# Patient Record
Sex: Male | Born: 1960
Health system: Southern US, Community
[De-identification: ages and names within clinical notes are randomized; demographics above are authoritative.]

## PROBLEM LIST (undated history)

## (undated) DIAGNOSIS — T8859XA Other complications of anesthesia, initial encounter: Secondary | ICD-10-CM

## (undated) DIAGNOSIS — K279 Peptic ulcer, site unspecified, unspecified as acute or chronic, without hemorrhage or perforation: Secondary | ICD-10-CM

## (undated) DIAGNOSIS — E119 Type 2 diabetes mellitus without complications: Secondary | ICD-10-CM

## (undated) DIAGNOSIS — I10 Essential (primary) hypertension: Secondary | ICD-10-CM

## (undated) DIAGNOSIS — T7840XA Allergy, unspecified, initial encounter: Secondary | ICD-10-CM

## (undated) DIAGNOSIS — Z8719 Personal history of other diseases of the digestive system: Secondary | ICD-10-CM

## (undated) DIAGNOSIS — K269 Duodenal ulcer, unspecified as acute or chronic, without hemorrhage or perforation: Secondary | ICD-10-CM

## (undated) DIAGNOSIS — M1990 Unspecified osteoarthritis, unspecified site: Secondary | ICD-10-CM

## (undated) DIAGNOSIS — K274 Chronic or unspecified peptic ulcer, site unspecified, with hemorrhage: Secondary | ICD-10-CM

## (undated) DIAGNOSIS — R7989 Other specified abnormal findings of blood chemistry: Secondary | ICD-10-CM

## (undated) DIAGNOSIS — Z720 Tobacco use: Secondary | ICD-10-CM

## (undated) DIAGNOSIS — I639 Cerebral infarction, unspecified: Secondary | ICD-10-CM

## (undated) DIAGNOSIS — T4145XA Adverse effect of unspecified anesthetic, initial encounter: Secondary | ICD-10-CM

## (undated) DIAGNOSIS — N186 End stage renal disease: Secondary | ICD-10-CM

## (undated) DIAGNOSIS — D649 Anemia, unspecified: Secondary | ICD-10-CM

## (undated) DIAGNOSIS — K766 Portal hypertension: Secondary | ICD-10-CM

## (undated) DIAGNOSIS — Z87442 Personal history of urinary calculi: Secondary | ICD-10-CM

## (undated) DIAGNOSIS — N189 Chronic kidney disease, unspecified: Secondary | ICD-10-CM

## (undated) DIAGNOSIS — K284 Chronic or unspecified gastrojejunal ulcer with hemorrhage: Secondary | ICD-10-CM

## (undated) DIAGNOSIS — K7460 Unspecified cirrhosis of liver: Secondary | ICD-10-CM

## (undated) DIAGNOSIS — E1140 Type 2 diabetes mellitus with diabetic neuropathy, unspecified: Secondary | ICD-10-CM

## (undated) HISTORY — PX: SPLENECTOMY: SUR1306

## (undated) HISTORY — DX: Anemia, unspecified: D64.9

## (undated) HISTORY — DX: Unspecified cirrhosis of liver: K74.60

## (undated) HISTORY — DX: Portal hypertension: K76.6

## (undated) HISTORY — PX: PANCREAS SURGERY: SHX731

## (undated) HISTORY — DX: Allergy, unspecified, initial encounter: T78.40XA

## (undated) HISTORY — PX: LITHOTRIPSY: SUR834

## (undated) HISTORY — PX: COLONOSCOPY: SHX174

## (undated) HISTORY — PX: CHOLECYSTECTOMY: SHX55

---

## 1998-12-19 ENCOUNTER — Emergency Department (HOSPITAL_COMMUNITY): Admission: EM | Admit: 1998-12-19 | Discharge: 1998-12-19 | Payer: Self-pay | Admitting: Emergency Medicine

## 1999-11-23 ENCOUNTER — Emergency Department (HOSPITAL_COMMUNITY): Admission: EM | Admit: 1999-11-23 | Discharge: 1999-11-23 | Payer: Self-pay | Admitting: Emergency Medicine

## 1999-11-23 ENCOUNTER — Encounter: Payer: Self-pay | Admitting: Internal Medicine

## 1999-12-05 ENCOUNTER — Encounter: Payer: Self-pay | Admitting: Surgery

## 1999-12-07 ENCOUNTER — Inpatient Hospital Stay (HOSPITAL_COMMUNITY): Admission: RE | Admit: 1999-12-07 | Discharge: 1999-12-17 | Payer: Self-pay | Admitting: Surgery

## 1999-12-07 ENCOUNTER — Encounter (INDEPENDENT_AMBULATORY_CARE_PROVIDER_SITE_OTHER): Payer: Self-pay

## 2000-01-08 ENCOUNTER — Encounter: Payer: Self-pay | Admitting: Surgery

## 2000-01-08 ENCOUNTER — Ambulatory Visit (HOSPITAL_COMMUNITY): Admission: RE | Admit: 2000-01-08 | Discharge: 2000-01-08 | Payer: Self-pay | Admitting: Surgery

## 2000-01-14 ENCOUNTER — Encounter: Payer: Self-pay | Admitting: Surgery

## 2000-01-14 ENCOUNTER — Ambulatory Visit (HOSPITAL_COMMUNITY): Admission: RE | Admit: 2000-01-14 | Discharge: 2000-01-14 | Payer: Self-pay | Admitting: Surgery

## 2000-01-22 ENCOUNTER — Emergency Department (HOSPITAL_COMMUNITY): Admission: EM | Admit: 2000-01-22 | Discharge: 2000-01-22 | Payer: Self-pay | Admitting: Emergency Medicine

## 2000-02-01 ENCOUNTER — Encounter: Payer: Self-pay | Admitting: Surgery

## 2000-02-01 ENCOUNTER — Ambulatory Visit (HOSPITAL_COMMUNITY): Admission: RE | Admit: 2000-02-01 | Discharge: 2000-02-01 | Payer: Self-pay | Admitting: Surgery

## 2000-02-24 ENCOUNTER — Inpatient Hospital Stay (HOSPITAL_COMMUNITY): Admission: EM | Admit: 2000-02-24 | Discharge: 2000-02-28 | Payer: Self-pay | Admitting: Emergency Medicine

## 2000-02-24 ENCOUNTER — Encounter: Payer: Self-pay | Admitting: Emergency Medicine

## 2000-02-25 ENCOUNTER — Encounter: Payer: Self-pay | Admitting: Surgery

## 2000-07-07 ENCOUNTER — Emergency Department (HOSPITAL_COMMUNITY): Admission: EM | Admit: 2000-07-07 | Discharge: 2000-07-07 | Payer: Self-pay | Admitting: Emergency Medicine

## 2000-07-07 ENCOUNTER — Encounter: Payer: Self-pay | Admitting: Emergency Medicine

## 2000-07-10 ENCOUNTER — Ambulatory Visit (HOSPITAL_COMMUNITY): Admission: RE | Admit: 2000-07-10 | Discharge: 2000-07-10 | Payer: Self-pay | Admitting: Surgery

## 2000-07-10 ENCOUNTER — Encounter: Payer: Self-pay | Admitting: Surgery

## 2000-07-11 ENCOUNTER — Ambulatory Visit (HOSPITAL_COMMUNITY): Admission: RE | Admit: 2000-07-11 | Discharge: 2000-07-12 | Payer: Self-pay | Admitting: Surgery

## 2000-07-11 ENCOUNTER — Encounter: Payer: Self-pay | Admitting: Surgery

## 2000-07-12 ENCOUNTER — Encounter: Payer: Self-pay | Admitting: Surgery

## 2000-07-21 ENCOUNTER — Ambulatory Visit (HOSPITAL_COMMUNITY): Admission: RE | Admit: 2000-07-21 | Discharge: 2000-07-21 | Payer: Self-pay | Admitting: Surgery

## 2000-07-21 ENCOUNTER — Encounter: Payer: Self-pay | Admitting: Surgery

## 2000-07-24 ENCOUNTER — Encounter: Payer: Self-pay | Admitting: Surgery

## 2000-07-24 ENCOUNTER — Ambulatory Visit (HOSPITAL_COMMUNITY): Admission: RE | Admit: 2000-07-24 | Discharge: 2000-07-24 | Payer: Self-pay | Admitting: Surgery

## 2000-08-22 ENCOUNTER — Ambulatory Visit (HOSPITAL_COMMUNITY): Admission: RE | Admit: 2000-08-22 | Discharge: 2000-08-22 | Payer: Self-pay | Admitting: Surgery

## 2000-08-22 ENCOUNTER — Encounter: Payer: Self-pay | Admitting: Surgery

## 2000-10-20 ENCOUNTER — Encounter (INDEPENDENT_AMBULATORY_CARE_PROVIDER_SITE_OTHER): Payer: Self-pay | Admitting: Specialist

## 2000-10-20 ENCOUNTER — Encounter: Payer: Self-pay | Admitting: Surgery

## 2000-10-20 ENCOUNTER — Inpatient Hospital Stay (HOSPITAL_COMMUNITY): Admission: RE | Admit: 2000-10-20 | Discharge: 2000-10-27 | Payer: Self-pay | Admitting: Surgery

## 2000-11-11 ENCOUNTER — Inpatient Hospital Stay (HOSPITAL_COMMUNITY): Admission: AD | Admit: 2000-11-11 | Discharge: 2000-11-14 | Payer: Self-pay | Admitting: Surgery

## 2000-11-11 ENCOUNTER — Encounter: Payer: Self-pay | Admitting: Surgery

## 2000-11-11 ENCOUNTER — Encounter: Admission: RE | Admit: 2000-11-11 | Discharge: 2000-11-11 | Payer: Self-pay | Admitting: General Surgery

## 2000-11-11 ENCOUNTER — Encounter: Payer: Self-pay | Admitting: General Surgery

## 2000-11-12 ENCOUNTER — Encounter: Payer: Self-pay | Admitting: Surgery

## 2000-11-24 ENCOUNTER — Ambulatory Visit (HOSPITAL_COMMUNITY): Admission: RE | Admit: 2000-11-24 | Discharge: 2000-11-24 | Payer: Self-pay | Admitting: Urology

## 2000-11-24 ENCOUNTER — Encounter: Payer: Self-pay | Admitting: Urology

## 2000-11-27 ENCOUNTER — Ambulatory Visit (HOSPITAL_COMMUNITY): Admission: RE | Admit: 2000-11-27 | Discharge: 2000-11-27 | Payer: Self-pay | Admitting: Urology

## 2000-11-27 ENCOUNTER — Encounter: Payer: Self-pay | Admitting: Urology

## 2000-11-28 ENCOUNTER — Ambulatory Visit (HOSPITAL_COMMUNITY): Admission: RE | Admit: 2000-11-28 | Discharge: 2000-11-28 | Payer: Self-pay | Admitting: Surgery

## 2000-11-28 ENCOUNTER — Encounter: Payer: Self-pay | Admitting: Surgery

## 2000-12-04 ENCOUNTER — Encounter: Admission: RE | Admit: 2000-12-04 | Discharge: 2000-12-04 | Payer: Self-pay | Admitting: Urology

## 2000-12-04 ENCOUNTER — Encounter: Payer: Self-pay | Admitting: Urology

## 2000-12-24 ENCOUNTER — Ambulatory Visit (HOSPITAL_COMMUNITY): Admission: RE | Admit: 2000-12-24 | Discharge: 2000-12-24 | Payer: Self-pay | Admitting: Surgery

## 2000-12-24 ENCOUNTER — Encounter: Payer: Self-pay | Admitting: Surgery

## 2001-01-01 ENCOUNTER — Ambulatory Visit (HOSPITAL_COMMUNITY): Admission: RE | Admit: 2001-01-01 | Discharge: 2001-01-01 | Payer: Self-pay | Admitting: Urology

## 2001-01-27 ENCOUNTER — Ambulatory Visit (HOSPITAL_COMMUNITY): Admission: RE | Admit: 2001-01-27 | Discharge: 2001-01-27 | Payer: Self-pay | Admitting: Urology

## 2001-01-27 ENCOUNTER — Encounter: Payer: Self-pay | Admitting: Surgery

## 2001-01-27 ENCOUNTER — Encounter: Payer: Self-pay | Admitting: Urology

## 2001-01-28 ENCOUNTER — Encounter: Payer: Self-pay | Admitting: Emergency Medicine

## 2001-01-28 ENCOUNTER — Encounter (INDEPENDENT_AMBULATORY_CARE_PROVIDER_SITE_OTHER): Payer: Self-pay | Admitting: Specialist

## 2001-01-28 ENCOUNTER — Inpatient Hospital Stay (HOSPITAL_COMMUNITY): Admission: EM | Admit: 2001-01-28 | Discharge: 2001-01-30 | Payer: Self-pay | Admitting: Internal Medicine

## 2003-06-02 ENCOUNTER — Inpatient Hospital Stay (HOSPITAL_COMMUNITY): Admission: EM | Admit: 2003-06-02 | Discharge: 2003-06-04 | Payer: Self-pay | Admitting: *Deleted

## 2003-06-02 ENCOUNTER — Encounter: Payer: Self-pay | Admitting: *Deleted

## 2003-06-04 ENCOUNTER — Encounter: Payer: Self-pay | Admitting: Cardiology

## 2003-08-11 ENCOUNTER — Inpatient Hospital Stay (HOSPITAL_COMMUNITY): Admission: EM | Admit: 2003-08-11 | Discharge: 2003-08-15 | Payer: Self-pay | Admitting: Emergency Medicine

## 2003-08-13 ENCOUNTER — Encounter (INDEPENDENT_AMBULATORY_CARE_PROVIDER_SITE_OTHER): Payer: Self-pay | Admitting: *Deleted

## 2003-12-01 ENCOUNTER — Ambulatory Visit (HOSPITAL_COMMUNITY): Admission: RE | Admit: 2003-12-01 | Discharge: 2003-12-01 | Payer: Self-pay | Admitting: Internal Medicine

## 2003-12-22 ENCOUNTER — Ambulatory Visit (HOSPITAL_COMMUNITY): Admission: RE | Admit: 2003-12-22 | Discharge: 2003-12-22 | Payer: Self-pay | Admitting: Internal Medicine

## 2005-07-11 ENCOUNTER — Inpatient Hospital Stay (HOSPITAL_COMMUNITY): Admission: EM | Admit: 2005-07-11 | Discharge: 2005-07-19 | Payer: Self-pay | Admitting: Emergency Medicine

## 2005-07-16 ENCOUNTER — Ambulatory Visit: Payer: Self-pay | Admitting: Gastroenterology

## 2007-12-28 ENCOUNTER — Emergency Department (HOSPITAL_COMMUNITY): Admission: EM | Admit: 2007-12-28 | Discharge: 2007-12-28 | Payer: Self-pay | Admitting: Emergency Medicine

## 2008-01-13 ENCOUNTER — Ambulatory Visit (HOSPITAL_COMMUNITY): Admission: RE | Admit: 2008-01-13 | Discharge: 2008-01-13 | Payer: Self-pay | Admitting: Family Medicine

## 2008-01-15 ENCOUNTER — Ambulatory Visit (HOSPITAL_COMMUNITY): Admission: RE | Admit: 2008-01-15 | Discharge: 2008-01-15 | Payer: Self-pay | Admitting: Family Medicine

## 2008-11-04 ENCOUNTER — Inpatient Hospital Stay (HOSPITAL_COMMUNITY): Admission: EM | Admit: 2008-11-04 | Discharge: 2008-11-07 | Payer: Self-pay | Admitting: Emergency Medicine

## 2009-02-21 ENCOUNTER — Emergency Department (HOSPITAL_BASED_OUTPATIENT_CLINIC_OR_DEPARTMENT_OTHER): Admission: EM | Admit: 2009-02-21 | Discharge: 2009-02-21 | Payer: Self-pay | Admitting: Emergency Medicine

## 2009-02-21 ENCOUNTER — Ambulatory Visit: Payer: Self-pay | Admitting: Interventional Radiology

## 2010-09-11 ENCOUNTER — Emergency Department (HOSPITAL_BASED_OUTPATIENT_CLINIC_OR_DEPARTMENT_OTHER)
Admission: EM | Admit: 2010-09-11 | Discharge: 2010-09-12 | Disposition: A | Discharge status: Other Acute Inpatient Hospital | Payer: Self-pay | Source: Home / Self Care | Admitting: Emergency Medicine

## 2010-09-12 ENCOUNTER — Inpatient Hospital Stay (HOSPITAL_COMMUNITY)
Admission: EM | Admit: 2010-09-12 | Discharge: 2010-09-13 | Payer: Self-pay | Attending: Internal Medicine | Admitting: Internal Medicine

## 2010-10-27 ENCOUNTER — Encounter: Payer: Self-pay | Admitting: Internal Medicine

## 2010-12-18 LAB — COMPREHENSIVE METABOLIC PANEL
ALT: 32 U/L (ref 0–53)
AST: 39 U/L — ABNORMAL HIGH (ref 0–37)
Albumin: 3.9 g/dL (ref 3.5–5.2)
Calcium: 9 mg/dL (ref 8.4–10.5)
Creatinine, Ser: 1.4 mg/dL (ref 0.4–1.5)
GFR calc Af Amer: >60 mL/min (ref >60)
Sodium: 134 mEq/L — ABNORMAL LOW (ref 135–145)

## 2010-12-18 LAB — HEMOGLOBIN A1C: Hgb A1c MFr Bld: 12.5 % — ABNORMAL HIGH (ref <5.7)

## 2010-12-18 LAB — BASIC METABOLIC PANEL
Calcium: 8.5 mg/dL (ref 8.4–10.5)
GFR calc Af Amer: >60 mL/min (ref >60)
GFR calc non Af Amer: 54 mL/min — ABNORMAL LOW (ref >60)
GFR calc non Af Amer: 59 mL/min — ABNORMAL LOW (ref >60)
Glucose, Bld: 176 mg/dL — ABNORMAL HIGH (ref 70–99)
Potassium: 4 mEq/L (ref 3.5–5.1)
Sodium: 134 mEq/L — ABNORMAL LOW (ref 135–145)
Sodium: 139 mEq/L (ref 135–145)

## 2010-12-18 LAB — GLUCOSE, CAPILLARY
Glucose-Capillary: 124 mg/dL — ABNORMAL HIGH (ref 70–99)
Glucose-Capillary: 154 mg/dL — ABNORMAL HIGH (ref 70–99)
Glucose-Capillary: 216 mg/dL — ABNORMAL HIGH (ref 70–99)
Glucose-Capillary: 265 mg/dL — ABNORMAL HIGH (ref 70–99)
Glucose-Capillary: 266 mg/dL — ABNORMAL HIGH (ref 70–99)
Glucose-Capillary: 592 mg/dL (ref 70–99)

## 2010-12-18 LAB — DIFFERENTIAL
Band Neutrophils: 0 % (ref 0–10)
Basophils Absolute: 0 10*3/uL (ref 0.0–0.1)
Basophils Relative: 0 % (ref 0–1)
Eosinophils Absolute: 1 10*3/uL — ABNORMAL HIGH (ref 0.0–0.7)
Eosinophils Relative: 4 % (ref 0–5)
Metamyelocytes Relative: 0 %
Monocytes Absolute: 0.5 10*3/uL (ref 0.1–1.0)
Monocytes Relative: 2 % — ABNORMAL LOW (ref 3–12)
Myelocytes: 0 %

## 2010-12-18 LAB — C-REACTIVE PROTEIN: CRP: 7.7 mg/dL — ABNORMAL HIGH (ref <0.6)

## 2010-12-18 LAB — CBC
HCT: 42.2 % (ref 39.0–52.0)
Hemoglobin: 14.1 g/dL (ref 13.0–17.0)
Hemoglobin: 14.6 g/dL (ref 13.0–17.0)
MCH: 33.7 pg (ref 26.0–34.0)
MCHC: 34.7 g/dL (ref 30.0–36.0)
MCHC: 37 g/dL — ABNORMAL HIGH (ref 30.0–36.0)
Platelets: 239 10*3/uL (ref 150–400)
RBC: 4.89 MIL/uL (ref 4.22–5.81)
WBC: 19.8 10*3/uL — ABNORMAL HIGH (ref 4.0–10.5)
WBC: 21.6 10*3/uL — ABNORMAL HIGH (ref 4.0–10.5)

## 2010-12-18 LAB — CULTURE, ROUTINE-ABSCESS

## 2010-12-18 LAB — SEDIMENTATION RATE: Sed Rate: 18 mm/hr — ABNORMAL HIGH (ref 0–16)

## 2011-01-21 LAB — COMPREHENSIVE METABOLIC PANEL
ALT: 41 U/L (ref 0–53)
ALT: 49 U/L (ref 0–53)
AST: 50 U/L — ABNORMAL HIGH (ref 0–37)
Albumin: 2.9 g/dL — ABNORMAL LOW (ref 3.5–5.2)
Albumin: 3.3 g/dL — ABNORMAL LOW (ref 3.5–5.2)
Alkaline Phosphatase: 180 U/L — ABNORMAL HIGH (ref 39–117)
Alkaline Phosphatase: 201 U/L — ABNORMAL HIGH (ref 39–117)
BUN: 11 mg/dL (ref 6–23)
Calcium: 8.8 mg/dL (ref 8.4–10.5)
Chloride: 106 mEq/L (ref 96–112)
GFR calc Af Amer: >60 mL/min (ref >60)
Glucose, Bld: 130 mg/dL — ABNORMAL HIGH (ref 70–99)
Glucose, Bld: 165 mg/dL — ABNORMAL HIGH (ref 70–99)
Potassium: 4.2 mEq/L (ref 3.5–5.1)
Potassium: 4.2 mEq/L (ref 3.5–5.1)
Sodium: 135 mEq/L (ref 135–145)
Sodium: 139 mEq/L (ref 135–145)
Total Bilirubin: 0.7 mg/dL (ref 0.3–1.2)
Total Protein: 5.8 g/dL — ABNORMAL LOW (ref 6.0–8.3)
Total Protein: 6.6 g/dL (ref 6.0–8.3)

## 2011-01-21 LAB — DRUG SCREEN PANEL (SERUM)
Amphetamine Scrn: NEGATIVE
Barbiturate Scrn: NEGATIVE
Cannabinoid, Blood: NEGATIVE
Cocaine (Metabolite): NEGATIVE
Opiates, Blood: NEGATIVE

## 2011-01-21 LAB — GLUCOSE, CAPILLARY
Glucose-Capillary: 117 mg/dL — ABNORMAL HIGH (ref 70–99)
Glucose-Capillary: 119 mg/dL — ABNORMAL HIGH (ref 70–99)
Glucose-Capillary: 130 mg/dL — ABNORMAL HIGH (ref 70–99)
Glucose-Capillary: 148 mg/dL — ABNORMAL HIGH (ref 70–99)
Glucose-Capillary: 196 mg/dL — ABNORMAL HIGH (ref 70–99)
Glucose-Capillary: 212 mg/dL — ABNORMAL HIGH (ref 70–99)

## 2011-01-21 LAB — DIFFERENTIAL
Basophils Relative: 2 % — ABNORMAL HIGH (ref 0–1)
Eosinophils Absolute: 0.6 10*3/uL (ref 0.0–0.7)
Eosinophils Relative: 3 % (ref 0–5)
Lymphs Abs: 8.1 10*3/uL — ABNORMAL HIGH (ref 0.7–4.0)
Monocytes Absolute: 2.6 10*3/uL — ABNORMAL HIGH (ref 0.1–1.0)
Neutro Abs: 8.1 10*3/uL — ABNORMAL HIGH (ref 1.7–7.7)

## 2011-01-21 LAB — URINE CULTURE
Colony Count: NO GROWTH
Culture: NO GROWTH
Special Requests: NEGATIVE

## 2011-01-21 LAB — URINALYSIS, ROUTINE W REFLEX MICROSCOPIC
Bilirubin Urine: NEGATIVE
Hgb urine dipstick: NEGATIVE
Ketones, ur: NEGATIVE mg/dL
Specific Gravity, Urine: 1.012 (ref 1.005–1.030)
pH: 7.5 (ref 5.0–8.0)

## 2011-01-21 LAB — CBC
HCT: 37.8 % — ABNORMAL LOW (ref 39.0–52.0)
Hemoglobin: 12.9 g/dL — ABNORMAL LOW (ref 13.0–17.0)
MCHC: 33.7 g/dL (ref 30.0–36.0)
RDW: 12.9 % (ref 11.5–15.5)
WBC: 19.1 10*3/uL — ABNORMAL HIGH (ref 4.0–10.5)

## 2011-01-21 LAB — CK TOTAL AND CKMB (NOT AT ARMC)
CK, MB: 1.4 ng/mL (ref 0.3–4.0)
Total CK: 87 U/L (ref 7–232)

## 2011-01-21 LAB — APTT
aPTT: 31 seconds (ref 24–37)
aPTT: 34 seconds (ref 24–37)

## 2011-01-21 LAB — TECHNOLOGIST SMEAR REVIEW

## 2011-01-21 LAB — TROPONIN I: Troponin I: 0.01 ng/mL (ref 0.00–0.06)

## 2011-01-22 LAB — GLUCOSE, CAPILLARY: Glucose-Capillary: 147 mg/dL — ABNORMAL HIGH (ref 70–99)

## 2011-02-19 NOTE — H&P (Signed)
NAMESONNI, BISCOE                ACCOUNT NO.:  [id number]   MEDICAL RECORD NO.:  [id number]          PATIENT TYPE:  INP   LOCATION:  [id number]                         FACILITY:  [facility]   PHYSICIAN:  Larey Seat, M.D.  DATE OF BIRTH:  1961-04-22   DATE OF ADMISSION:  11/04/2008  DATE OF DISCHARGE:                              HISTORY & PHYSICAL   CHIEF COMPLAINT:  Right-sided weakness and slurred speech.   HISTORY. OF PRESENT ILLNESS:  Mr. Berne is a pleasant 50 year old  Caucasian right-handed male with a past medical history of hypertension,  dyslipidemia, tobacco abuse who was in his normal state of health until  approximately 9:30 p.m. this evening, when he noticed right hand  weakness and had difficulty riding while he was at work, working as a  Presenter, broadcasting.  He also noted slurred speech at that time and EMS was  called to his workplace.  He was promptly taken to [facility] ER.  On arrival, Neurology was  called and the patient was evaluated at 2240.  In ED, blood pressure was  180/109.  Prior to the onset of his right-sided symptoms, he had no complaints.  He denies headache, visual changes, or gait disturbance.  He denies any recent illness or problems with bleeding.   PAST MEDICAL HISTORY:  1. Hypertension.  2. Dyslipidemia.  3. Tobacco abuse.  4. Chronic pancreatitis with pancreatic pseudocyst requiring surgery.  5. Splenectomy.  6. Cholecystectomy.   MEDICATIONS:  1. Lisinopril 10 mg daily.  2. Metformin 500 mg in the morning and 1000 mg in the evening.   ALLERGIES:  ASPIRIN, reaction peptic ulcer.   FAMILY HISTORY:  Significant for coronary artery disease in his father  who died at the age of 50.  Mother died at the age of 52 from [place].  He has 5 brothers and 1 sister in good health.   SOCIAL HISTORY:  Currently, he smokes less than 1 pack per day.  He is  widowed.  He works as a Presenter, broadcasting.   REVIEW OF SYSTEMS:  The patient reports significant  weight loss over the  past 6 years going from 280 pounds to 170 pounds, this was secondary to  his problems of pancreatitis.  No recent weight loss.  No recent fevers  or chills.  He denies chest pain, shortness of breath, abdominal pain,  diarrhea, nausea, vomiting, headache, vision loss, gait change.  Significant for new right face and arm weakness, right arm tingling and  coordination problems, 10-point review of systems otherwise negative.   PHYSICAL EXAMINATION:  GENERAL:  Alert, pleasant male in no distress.  VITAL SIGNS:  Blood pressure 181/109, pulse 96, respiratory rate 14, and  oxygen saturation 95% on nasal cannula.  HEENT:  Normocephalic and atraumatic.  Anicteric sclerae.  NECK:  Supple.  No masses, no carotid bruits bilaterally.  CARDIAC:  Regular rate and rhythm.  No murmurs, rubs, or gallops.  CHEST:  Clear to auscultation bilaterally.  No wheezes or rhonchi.  ABDOMEN:  Healed scar over left upper abdomen.  Soft, nontender, and  nondistended.  Normal bowel sounds.  EXTREMITIES:  No clubbing, cyanosis, or edema.  Dorsalis pedis pulses  intact bilaterally.  SKIN:  No open lesions or rashes.  NEUROLOGIC:  Mental Status:  Alert and oriented to person, place, and  time.  Speech is moderately dysarthric.  Naming and repetition are  intact.  Mood and affect are appropriate.  Attention and concentration are adequate.  Cranial Nerves:  Significant  for moderate right lower facial weakness.  Pupils are equal, round, and  reactive to light.  Extraocular motility is intact throughout.  Visual  fields intact in all quadrants to direct confrontational testing.  Face  sensation intact bilaterally to pinprick and light touch.  Head  rotational strength and shoulder shrug are intact.  Tongue protrudes  midline.  Palate elevates symmetrically.  Motor:  He has no pronator drift on the NIH stroke scale.  On motor  testing, he has 4+/5 weakness in the distal right upper extremity with   wrist flexion and extension, finger extension and flexion.  Strength is  otherwise 5/5 in all major muscle groups.    Sensation:  Decreased sensation to pinprick in the right upper  extremity.  Sensation is otherwise intact to light touch, vibration,  positional sensation throughout.    Coordination:  Normal finger-to-nose on the left upper extremity.  Right upper extremity, finger-to-nose is inhibited by distal weakness,  but no dysmetria.  Heel-to-shin testing intact bilaterally.  Reflexes:  Reflexes are symmetric 1+ in the upper extremities at biceps, triceps,  brachioradialis, and patellar.  Absent at the ankles.  Left toe is downgoing, right toe is upgoing.  Gait is not examined  secondary to continuous monitoring for intracranial hemorrhage.   IMAGING:  Head CT on November 04, 2008.  Head CT was immediately viewed  by me personally after completion.  Significant for left basal ganglia  hemorrhage measuring approximately 2 x 1.5 cm in the left basal ganglia.  There is no ventricular extension.  No other evidence of intracranial  mass.  No shift.   LABORATORY RESULTS:  INR 1.0, PTT 31, and protime 12.9.  CBC shows WBC  19.8, 41% neutrophils, 41% lymphocytes, 13% monocytes.  Hemoglobin 14.1,  hematocrit 41.7, and platelets 334.  Troponin 0.01, CK-MB 1.4, and  creatinine kinase 87.  Sodium 135, potassium 4.2, chloride 104, CO2 24,  glucose 165, BUN 12, and creatinine 1.1.  Alk phos 201, AST 50, and ALT  49.   ASSESSMENT AND PLAN:  Mr. Blasingame is a pleasant 50 year old right-handed  Caucasian male with a past medical history of hypertension, tobacco  abuse, dyslipidemia, remote history of pancreatitis, who presents today  with acute onset of right face and arm weakness found to have left basal  ganglia hemorrhage.  NIH stroke scale is 3, 2 points for right facial  weakness, 1 point for right upper extremity decreased sensation.  Right  upper extremity has distal weakness, but no pronator  drift.   DIAGNOSIS:  Left basal ganglia intracranial hemorrhage.  Etiology, most  likely hypertensive.   PLAN:  1. Admit to the Neuro ICU for close monitoring with frequent neuro      checks and blood pressure monitoring.  We will initiate Cardene iv      protocol for hypertension control with systolic blood pressure goal      of 140-160.  2. N.p.o. until swallow study performed.  3. Physical Therapy, Occupational Therapy, and Speech Therapy consults      when stable.  4. Frequent vital checks.  5. Smoking cessation  counseling.  6. If any change in clinical exam with decreased mental status or new      focal findings we will repeat head CT      emergently and involve Neurosurgery as well.  Cerebral Haemorrhage      diagnosis and associated risks was discussed with the patient and      his brother in detail.  They understand the diagnoses and other questions were answered.      Greer Pickerel, MD   Electronically Signed     ______________________________  Larey Seat, M.D.    MO/MEDQ  D:  11/05/2008  T:  11/05/2008  Job:  [id number]

## 2011-02-19 NOTE — Discharge Summary (Signed)
Brian [name], Brian [name]                ACCOUNT NO.:  [id number]   MEDICAL RECORD NO.:  [id number]          PATIENT TYPE:  INP   LOCATION:  3036                         FACILITY:  [facility]   PHYSICIAN:  Larey Seat, M.D.  DATE OF BIRTH:  Mar 07, 1961   DATE OF ADMISSION:  11/04/2008  DATE OF DISCHARGE:  11/07/2008                               DISCHARGE SUMMARY   DISCHARGE DIAGNOSES:  1. Small vessel left basal ganglia hemorrhage secondary to      hypertension.  2. Hypertension.  3. Dyslipidemia.  4. Tobacco abuse.  5. Remote pancreatitis.  6. Splenectomy.  7. Cholecystectomy.   DISCHARGE MEDICINES:  1. Lisinopril 10 mg a day.  2. Metformin 500 mg b.i.d.  3. Hydrochlorothiazide 12.5 mg a day.   STUDIES PERFORMED:  1. CT of the brain on admission showed a left basal ganglia      hemorrhagic infarct.  2. CT of the brain at 24 hours showed no significant change in      hemorrhage.  3. CT of the brain at 48 hours showed no change in left basal ganglia      hematoma.  4. Chest x-ray shows no active cardiopulmonary disease.  5. EKG shows sinus tachycardia with borderline low voltage in frontal      leads, borderline prolonged QT interval.   LABORATORY STUDIES:  CBC with white blood cells 19.1, red blood cells  3.81, hemoglobin 12.9, hematocrit 37.8, MCV 99.3, MCHC 34.0, RDW 13.2,  and platelet count 310.  The pathology smear revealed absolute  lymphocytosis, favor reactive process, recommend clinical correlation.  PTT 34 and INR 1.0.  Chemistry with glucose 130, alkaline phosphatase  180, SGOT 44, SGPT 41, total protein 5.8, and albumin 2.9.  Urinalysis  shows no acute abnormality.  Had a urine culture done, no growth and  then there is a technologist's smear review, I do not even know if what  he says Brian [name] body, large platelets present, atypical lymphocytes  and then the pathologist says absolute lymphocytosis favor reactive  process, clinical correlation recommended.   HISTORY OF PRESENT ILLNESS:  Brian [name] is a 50 year old right-handed  Caucasian male with past medical history of hypertension, dyslipidemia,  and tobacco abuse, who was in his normal state of health until 9:30 p.m.  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  He also  noticed slurred speech at that time and EMS was called.  He was promptly  taken to [facility] Emergency Room.  On arrival, neurology was called  and the patient was evaluated.  In the ED, his blood pressure was  180/109.  Prior to the onset of his right-sided symptoms, he had no  complaints.  CT of the brain shows left basal ganglia hemorrhage.  He  was not a tPA candidate secondary to hemorrhage.  He was admitted to the  hospital where he was admitted to the Neuro ICU for further evaluation.   HOSPITAL COURSE:  The patient was started on the Cardene drip for high  blood  pressure, this was quickly weaned as his hemorrhage remained  stable throughout his hospital stay.  He was transferred to the floor.  He was evaluated by PT and OT.  They felt to have no therapy needs.  His  symptoms have mostly resolved except for a mild drift on his right upper  extremity, but he has no therapy needs.  He is safe for discharge home.  Of note, the patient had elevated white blood cells for which a smear  revealed absolute leukocytosis.  No clinical correlation can be made and  hematologist was called by physician.  There is no note from  hematologist in chart and followup will be determined by neurologist.   CONDITION ON DISCHARGE:  The patient is alert and oriented x3.  Speech  clear.  No aphasia.  Face had some mild right facial weakness.  Voice is  clear.  Extraocular movements are intact.  Visual fields are full.  He  has a very mild right pronator drift, otherwise no significant  abnormalities in his extremities.  His sensation is intact.    DISCHARGE/PLAN:  1. Discharge home with family.  2. Avoid aspirin or aspirin-containing products.  3. May return to work on Thursday, November 10, 2008.  4. Follow up with Dr. Brett Fairy in 2-3 months.  5. Address need for hematologist.      Burnetta Sabin, N.P.      Larey Seat, M.D.  Electronically Signed    SB/MEDQ  D:  11/07/2008  T:  11/08/2008  Job:  [id number]   cc:   Pramod P. Leonie Man, MD  Claris Gower, M.D.

## 2011-02-22 NOTE — Consult Note (Signed)
[facility]  Patient:    MOTAZ, ALBERS                       MRN: [id number] Proc. Date: 12/13/99 Adm. Date:  [id number] Attending:  Coralie Keens A CC:         Coralie Keens, M.D.             Edsel Petrin. Dalbert Batman, M.D.                          Consultation Report  HISTORY OF PRESENT ILLNESS:  Mr. Sircy is a 50 year old male whom I am asked to ee for acute drop in hemoglobin, with melena.  The patient has an interesting history in that he presented a few weeks ago with a large pseudocyst without any known antecedent abdominal pain or pancreatitis.  He says he has not had alcohol to drink in 15 years and was never a heavy drinker, and he has no reported history of gallstones.  He underwent surgical drainage with an external drain and debridement of the pancreas, with splenectomy, and he has been doing fairly well until today when he vomited once.  He did not notice any blood in the vomitus, but then he ad two melanotic stools and his hemoglobin returned down at 7.9.  He has thrombocytosis apparently secondary to his splenectomy and was recently begun on aspirin, which has been immediately discontinued.  His platelet count was 179,000. WBC currently is 22.4, which is hopefully just and acute reaction to his hemorrhage.  The pseudocyst that was removed was in the tail of the pancreas and pathology revealed a benign pseudocyst and essentially normal spleen.  The patient denies abdominal pain at present and has never had ulcers or gastrointestinal bleeding before.  PAST MEDICAL HISTORY:  Pertinent for hypertension and gout.  CURRENT MEDICATIONS: 1.  Allopurinol 300 mg q.d. 2.  Procardia XL 60 mg q.d. 3.  Altace 5 mg b.i.d.  ALLERGIES:  No known drug allergies.  FAMILY HISTORY:  Noncontributory.  SOCIAL HISTORY:  He smokes one pack of cigarettes per day and, as noted before, has not had any alcohol in 15 years.  PHYSICAL  EXAMINATION:  GENERAL:  He is a well-developed, obese, adult male in no acute distress.  VITAL SIGNS:  He is afebrile.  Blood pressure 105/70, pulse 115.  HEENT:  Eyes:  Anicteric.  Conjunctivae pale.  Oropharynx unremarkable.  SKIN:  Normal.  CHEST:  Clear.  HEART:  Heart sounds are mildly tachycardic.  ABDOMEN:  Recent transverse epigastric scar and surgical drain.  The abdomen is  nontender.  There are normal bowel sounds and no masses appreciated.  EXTREMITIES:  Without edema.  Dorsalis pedis pulses 2+ bilaterally.  IMPRESSION:  Acute gastrointestinal bleeding which sounds like it is upper.  PLAN:  Urgent endoscopy is going to be performed with further recommendations to follow. DD:  12/13/99 TD:  12/14/99 Job: [id number]

## 2011-02-22 NOTE — Discharge Summary (Signed)
Brian [name], Brian [name]                ACCOUNT NO.:  [id number]   MEDICAL RECORD NO.:  [id number]          PATIENT TYPE:  INP   LOCATION:  1612                         FACILITY:  [facility]   PHYSICIAN:  Sherryl Manges, M.D.  DATE OF BIRTH:  1961/01/04   DATE OF ADMISSION:  07/11/2005  DATE OF DISCHARGE:  07/19/2005                                 DISCHARGE SUMMARY   PMD:  Claris Gower, M.D.   DISCHARGE DIAGNOSES:  1.  Severe acute pancreatitis secondary to severe hypertriglyceridemia.  2.  History of recurrent acute pancreatitis, prior history of pseudocyst,      status post distal pancreatectomy and splenectomy in 2000.  3.  Cholestatic jaundice secondary to #1 above.  4.  Type 1 diabetes mellitus.  5.  Severe hypertriglyceridemia.  6.  Acute renal failure secondary to acute tubular necrosis/contrast      nephropathy.  7.  Hypertension.  8.  History of peptic ulcer disease.  9.  History of right renal calculus.   DISCHARGE MEDICATIONS:  1.  Norvasc 10 mg p.o. daily (patient has own medications).  2.  Fenofibrate (Tricor) 145 mg p.o. daily.  3.  Omacor (omega 3 acid) 1 gram p.o. b.i.d.  4.  Labetalol 200 mg p.o. b.i.d.  5.  Protonix 40 mg p.o. b.i.d.  6.  Augmentin 500 mg p.o. t.i.d. for 5 days only.  7.  Vicodin (5/500) 1 p.o. p.r.n. q.6 hourly for pain, a total of 28 pills      have been dispensed.  8.  Lantus insulin 10 units subcutaneously at bedtime.  9.  Sliding scale insulin with NovoLog as follows:  CBG 60-100 no insulin;      CBG 101-150, 1 unit; CBG 151-200, 2 units; CBG 201-250, 4 units; CBG 251-      300, 6 units; CBG 301-350, 8 units. CBG over 350, 10 units.   Note, Humulin 70/30 has been discontinued.   PROCEDURE:  1.  MR/MRCP dated July 11, 2005 showed severe inflammatory changes within      and surrounding the pancreas extending in the porta hepatis. Findings      are compatible with recurrent acute pancreatitis. There is significant      extrinsic mass  effect on the portal and superior mesenteric veins which      do not show definite evidence of patency. No significant intra or      extrahepatic biliary dilatation is seen. There is no focal      peripancreatic fluid collection.  2.  Abdominal CT scan dated July 12, 2005. This showed severe pancreatitis      which involved the head of the pancreas. A good portion of the body and      tail of the pancreas have been resected. The patient is status post      splenectomy and cholecystectomy. There is moderate phlegmon and fluid in      the upper abdomen. The portal vein is patent surrounded by fluid and      edema. Superior mesenteric confluence is difficult to see but probably      still  patent. Small left kidney, slightly enlarged right kidney with      renal calculi, moderate inflammation of stomach and duodenum. A small      amount of free pelvic fluid, no acute pelvic findings.  3.  Renal ultrasound dated July 13, 2005. This showed mild left sided      pelvic caliectasis, also right sided nephrolithiasis.  4.  Pelvic/abdominal CT scan dated July 15, 2005. When compared to prior      exam from 3 days ago, there is a similar amount of ascites and prominent      mesenteric edema with retroperitoneal and mesenteric adenopathy, right      nephrolithiasis is stable, small right pleural effusion and bibasilar      subsegmental atelectasis. Appendix is normal. There is pelvic ascites.  5.  Arteriovenous arterial Doppler. This was a limited exam; however, showed      a portal venous  hepatopetal inflow. Hepatic veins are patent.      Splenectomy, small amount of ascites, right kidney is enlarged.   CONSULTATIONS:  1.  Dr. Jerilynn Mages. Fuller Plan, gastroenterology.  2.  Dr. Marval Regal, nephrology.  3.  Dr. Edrick Oh, nephrology.  4.  Dr. Electa Sniff, endocrinology.   ADMISSION HISTORY:  As in H&P notes of July 11, 2005.  However, in brief,  this is a 50 year old male, with a known history of  chronic/recurrent acute  pancreatitis, complicated by pseudocyst formation; status post distal  pancreatectomy and splenectomy in 2000, also type 1 diabetes mellitus,  hypertriglyceridemia and hypertension, history of peptic ulcer disease and  right renal calculus who presents with a 24-hour history of intractable  vomiting associated with minimal abdominal pain, a low grade fever of 99 to  100 degrees Fahrenheit. He was found to have markedly abnormal LFTs on  initial evaluation in the emergency department as well as clinically evident  jaundice. He was therefore admitted for further evaluation, investigation  and management.   CLINICAL COURSE:  #1.  SEVERE ACUTE PANCREATITIS/CHOLESTATIC JAUNDICE:  This  is a patient with a rather complicated pancreatic history, who has in the  past had chronic pancreatitis and recurrent acute attacks complicated by  pseudocyst formation, and who subsequently had a distal pancreatectomy and  splenectomy in the year 2000, now presenting with a 1 day history of  persistent vomiting, abdominal pain and was found to be jaundiced. White  cell count was found to be elevated at 26.1 with 78% neutrophils. Serum  alkaline phosphatase was 292, AST 314, ALT 191, amylase 1086, lipase 1405.  Total bilirubin was 6.4. The patient was admitted to the intensive care  unit, managed with bowel rest, intravenous fluid hydration, analgesics,  proton pump inhibitors. Gastroenterology consultation was called, which was  kindly provided by Dr. Jerilynn Mages. Fuller Plan. The patient had a number of imaging studies  including MR/MRCP, abdominal/pelvic CT scan, renal ultrasound and also  abdominal arteriovenous Doppler. For detailed findings of these imaging  studies, refer to procedure list above. However, conclusion was that the  patient did indeed have severe acute pancreatitis secondary to marked severe  hypertriglyceridemia, resulting in inflammation of head of pancreas with associated edema  and compression of biliary tract resulting in cholestatic  jaundice. With above mentioned management measures, the patient gradually  improved clinically. Abdominal pain subsided. As of July 14, 2005, serum  amylase of 85 with a lipase of 44. Steady improvement was noted in LFTs with  AST dropping to 90 and ALT to 81 on July 14, 2005 although  alkaline  phosphatase remained elevated at 325. These indices have gradually decreased  over the period of the patient's hospital stay so that as of July 19, 2005, AST was normalized at 29, ALT was 22, alkaline phosphatase was 206.  There was a transient elevation of total serum bilirubin to 13.0 as of  July 08, 2005 with a direct bilirubin level 8.3 and indirect bilirubin of  4.7. These levels have also gradually dropped, so that on July 19, 2005,  total serum bilirubin was 6.1. The patient's jaundice has paralleled these  bilirubin levels and he was only mildly jaundiced as of July 19, 2005.  The patient was empirically covered with Zosyn for the possibility of  intraabdominal infection; however, imaging studies really did not show any  pseudocyst although there was suspicion of early pseudocyst close to the  pancreatic head. The patient has completed a 5 day course of Zosyn and has  been switched to Augmentin for p.o. administration for the next 5 days  following discharge. He showed no further episodes of vomiting during the  course of his hospital stay. Initial nausea has subsided. The patient's oral  intake has improved markedly as has his appetite. Certainly, as of July 19, 2005, he was able to move from clear fluids to full fluid and  subsequently to regular low fat diet per GI recommendations. He had a  transient episode of hiccups lasting approximately 24-48 hours which was  treated successfully with Reglan.   #2.  SEVERE HYPERTRIGLYCERIDEMIA:  See #1 above. This is thought to be the  culprit, for the patient's episodes  of acute pancreatitis. As of July 14, 2005, the patient's total cholesterol was 490 with triglycerides of 1315.  The patient has been treated with a combination of Fenofibrate and Omacor on  recommendations of Dr. Electa Sniff who was called in endocrinology consultation.  As of July 18, 2005, triglycerides had dropped down to 319. LDL was noted  to be elevated at 318, but it is expected that this will respond to Omacor.  Be that as it may, the patient has followup arrangements in place to see Dr.  Electa Sniff as an outpatient, who might make further adjustments to patient's  medications.   #3.  TYPE 1 DIABETES MELLITUS:  This was satisfactorily managed with a  combination of carbohydrate modified diet, sliding scale insulin and  scheduled Lantus.   #4.  ACUTE RENAL FAILURE:  The patient presented with a BUN of 15 and  creatinine of 1.4 from the outset. He was managed with aggressive  intravenous fluid hydration. However, by July 12, 2005, the patient's creatinine was elevated at 3.2, this was 3.5 on July 13, 2005, i.e.  features consistent with acute renal failure. A renal consultation was  called. This was kindly provided by Dr. Marval Regal and Dr. Justin Mend. The  conclusion, was that this was acute renal failure secondary to a combination  of acute tubular necrosis possibly from dehydration and further complicated  by contrast nephropathy following imaging studies. The recommendation was  that no further contrast be administered. Intravenous fluid hydration was  continued. Renal ultrasound scan demonstrated right urolithiasis and mild  left sided pelvic caliectasis. The left kidney was found to be small  compared to the right. The patient's renal function continued to gradually  improve and as of July 19, 2005 creatinine was 2.3 with a BUN of 24. The  patient will certainly need outpatient monitoring of serum electrolytes, BUN  and creatinine. We expect  this to be carried out by his  primary MD.   #5.  HYPERTENSION:  This was adequately controlled with a combination of  Norvasc and Normodyne during the course of the patient's hospital stay.   DISPOSITION:  The patient is discharged in satisfactory condition on July 19, 2005. He is expected to return to regular duties on August 02, 2005.   DIET:  Carbohydrate modified/low fat diet.   ACTIVITY:  Advised to increase activities slowly.   WOUND CARE:  N/A.   PAIN MANAGEMENT:  Refer to discharge medication list.   FOLLOW UP:  The patient is to followup routinely with his PMD, Dr. Arelia Sneddon in  1-2 weeks. He is also to followup with Dr. Ardis Hughs of [facility] on July 24, 2005 at 8:45 a.m., telephone [phone]. Also  he is to followup with Dr. Electa Sniff, endocrinologist, on July 31, 2005  at  11:30 a.m., telephone [phone]. All of this has been communicated to the  patient and he has verbalized an understanding.      Sherryl Manges, M.D.  Electronically Signed     CO/MEDQ  D:  07/19/2005  T:  07/19/2005  Job:  [id number]   cc:   Claris Gower, M.D.  Fax: [phone]   Parke Poisson. Electa Sniff, M.D.  Fax: [phone]   Milus Banister, M.D.   Donato Heinz, M.D.  Fax: [phone]   Sherril Croon, M.D.  Fax: [phone]

## 2011-02-22 NOTE — Discharge Summary (Signed)
[place]  Patient:    Brian [name], Brian [name]                       MRN: [id number] Adm. Date:  [id number] Disc. Date: [id number] Attending:  Coralie Keens A                           Discharge Summary  HISTORY OF PRESENT ILLNESS AND HOSPITAL COURSE:  [name] is a 50 year old gentleman who had undergone drainage of a complex pancreatic pseudocyst back in March [id number], complicated by intra-abdominal abscesses postoperatively which required extensive drainage via CT guidance.  The patient had since been discharged home and had been doing well; however, he was admitted on Feb 24, 2000, with nausea and a temperature of 101.7.  He was also found to have a white blood count of 37,000.  At this point he was admitted and placed on IV antibiotics.  On Feb 25, 2000, he went to radiology, where he underwent a drainage of about 1.5 cc of thick yellow fluid aspirated along the left anterior abdominal fluid collection, which was sent for amylase, lipase, Grams stain, and cultures.  However, because the collection was small, the decision was made to not proceed with drainage.  The patient quickly defervesced after this and was quickly advanced back to a regular diet.  A C. difficile was sent and was normal.  By Feb 27, 2000, the patients white blood count was 24.6.  Again, he was still having fevers.  His high white blood count was attributed to the patients splenectomy.  By Feb 28, 2000, the patient was afebrile.  He was having bowel movements.  The abdomen was soft.  Cultures on his abdomen were negative.  His white blood count had decreased to 21,000; therefore, the decision was made to discomfort the patient to home.  DISCHARGE DIAGNOSIS:  Recurrent intra-abdominal abscess, status post drainage of pancreatic pseudocyst.  DIET:  Regular.  ACTIVITY:  As tolerated.  MEDICATIONS:  He will resume his home medications.  He will take Augmentin 875 mg p.o.  b.i.d.  FOLLOW-UP:  He will follow up with my office one week post discharge. DD:  04/15/00 TD:  04/16/00 Job: [id number]

## 2011-02-22 NOTE — Op Note (Signed)
[place]  Patient:    Brian [name], Brian [name]                       MRN: [id number] Proc. Date: 12/13/99 Adm. Date:  [id number] Attending:  Coralie Keens A CC:         Coralie Keens, M.D.             Edsel Petrin. Dalbert Batman, M.D.                           Operative Report  PROCEDURES:  Video upper endoscopy with control of hemorrhage.  PHYSICIAN:  Knox Saliva, M.D.  INDICATIONS:  A 50 year old male with acute upper GI bleeding with hemoglobin 7.9.  PREPARATION:  He is n.p.o.  PREPROCEDURE SEDATION:  He received 100 mg of Demerol and 10 mg of Versed intravenously.  In addition, his throat was anesthetized with Hurricaine spray, and he was on 2 liters of nasal cannula O2.  PROCEDURE:  The Olympus video upper endoscope was inserted via the mouth and advanced through the upper esophageal sphincter with ease.  Intubation was then  carried out into the stomach.  There was no blood in the esophagus and only a few coffee grounds in the stomach.  However, clot could be seen through the pylorus in the duodenal bulb.  Multiple attempts were made to wash this area to visualize hat was the problem with no success, and there appeared to be more active mildly pulsatile bleeding occurring in the duodenal bulb.  The standard endoscope was withdrawn, and a therapeutic two-channel video endoscope was reinserted which allow active irrigation as well as therapeutic intervention at the same time.  The clot was irrigated free of the duodenal bulb, and it was apparent the patient had a posterior superior 2.5 cm ulcer with active bleeding.  Multiple 1 cc injections of 1:10,000 epinephrine were injected around the periphery and the center of the ulcer.  The bleeding stopped, and any potential bleeding sites were cauterized ith BICAP.  I did not appreciate a distinct visible vessel, however.  At the conclusion of this procedure, there was no further bleeding.   The distal duodenum appeared  normal.  The patient tolerated the procedure well and pulse, blood pressure, and oximetry testing were stable throughout.  He was observed in recovery for 45 minutes and then transferred to the ICU.  Please see the transfer orders.  He will be observed closely with serial hemoglobin and transfusions as necessary. Helicobacter pylori antibodies should be check.  He will be placed on oral Protonix as well as IV Pepcid for the next few days. DD:  12/14/99 TD:  12/15/99 Job: [id number]

## 2011-02-22 NOTE — Procedures (Signed)
[facility]  Patient:    Brian [name], Brian [name]                       MRN: [id number] Proc. Date: 01/28/01 Adm. Date:  [id number] Disc. Date: [id number] Attending:  Lamona Curl CC:         Knox Saliva, M.D.  Douglas A. Ninfa Linden, M.D.  Einar Crow, M.D.   Procedure Report  PROCEDURE:  Esophagogastroduodenoscopy with biopsies.  INDICATION:  Patient with presumed upper GI bleeding with melena, increased BUN, and history of DU.  INFORMED CONSENT:  Consent was signed after risks, benefits, methods, and options were thoroughly discussed prior to any premedications given.  MEDICINES USED:  Demerol 60, Versed 6.  DESCRIPTION OF PROCEDURE:  The video endoscope was inserted by direct vision. The esophagus was normal.  In the distal esophagus was a small hiatal hernia. The scope was inserted into the stomach, which was normal.  No blood was seen and advanced through a normal antrum and normal pylorus into the duodenal bulb where a moderate sized duodenal ulcer with a flat, white base was seen.  No signs of bleeding or at-risk stigmata were seen.  The scope was inserted into the normal second portion of the duodenum.  Bile was seen.  No blood was seen distally.  The scope was withdrawn back to the bulb.  There was a little blood around the edge of the ulcer at this junction, but it could not be washed off. It was just due to the edge being friable.  Lots of washing and watching were done, but we could not make the ulcer bleed.  There was no at-risk stigmata in the ulcer base.  The scope was withdrawn back to the stomach which was evaluated on straight and retroflexed visualization and other than the hiatal hernia being seen in the cardia, no abnormalities were seen.  The scope was advanced to the antrum after water was suctioned, and one biopsy of antrum for the CLOtest to rule out Helicobacter was obtained, and then two of the antrum and two  of the proximal stomach were obtained and sent for pathology, again to rule out Helicobacter.  The scope was then reinserted into the duodenal bulb, and the ulcer was again watched and washed without being able to make it bleed or any at-risk stigmata.  We elected to stop the procedure at this juncture. The scope was drawn back to the stomach, and water was suctioned.  The scope was then slowly withdrawn.  Again, a good look at the esophagus on slow withdrawal was normal.  The scope was removed.  The patient tolerated the procedure well.  There was no obvious immediate complications.  ENDOSCOPIC DIAGNOSES: 1. Small hiatal hernia. 2. Moderate duodenal ulcer with the edge being friable with passing the scope    unable to make bleed with washing multiple times and no at-risk stigmata. 3. Otherwise within normal limits EGD without any blood distally.  Status post    stomach biopsy for both CLOtest and pathology to rule out Helicobacter.  PLAN:  Pump inhibitors.  Slowly advance diet.  Follow H&H, BUN, number of stools and color.  Repeat EGD p.r.n.  No Vioxx or other nonsteroidals long-term.  Might want to reendoscope to document healing at some point in the future and if he absolutely did need nonsteroidals, would definitely make sure no active ulcers and use probably pump inhibitors plus-minus Cytotec in the future.  Will leave further  work-up and plans to Dr. Vladimir Faster.DD:  01/28/01 TD:  01/29/01 Job: [id number]

## 2011-02-22 NOTE — H&P (Signed)
Brian [name], Brian [name]                ACCOUNT NO.:  [id number]   MEDICAL RECORD NO.:  [id number]          PATIENT TYPE:  EMS   LOCATION:  ED                           FACILITY:  [facility]   PHYSICIAN:  Lenord Fellers, MD   DATE OF BIRTH:  10-14-1960   DATE OF ADMISSION:  07/11/2005  DATE OF DISCHARGE:                                HISTORY & PHYSICAL   PRIMARY CARE PHYSICIAN:  Dr. Claris Gower   CHIEF COMPLAINT:  Intractable vomiting.   HISTORY OF PRESENT ILLNESS:  Brian [name] is a 50 year old male with a history  of insulin-dependent diabetes and a history of chronic pancreatitis with  pseudocysts who is also status post cholecystectomy who presents with 24  hours of intractable vomiting starting yesterday morning. Brian [name] states that Brian [name]  has not been feeling right for about the past week or so. When Brian [name] vomits  it is clear liquid, nonbilious. Brian [name] did note one occasional streak of blood  which Brian [name] attributed to retching. Brian [name] has been having normal bowel movements.  Brian [name] denies any diarrhea or clay-colored stool. Brian [name] has noted a low-grade fever  at home, up to 99 or 100 degrees Fahrenheit. Brian [name] has minimal abdominal pain  which Brian [name] attributes solely to the vomiting. Brian [name] denies any history of alcohol  intake. Brian [name] does have quite a remarkable history of hypertriglyceridemia. In  the emergency department Brian [name] received Zofran, Dilaudid, and normal saline.   PAST MEDICAL HISTORY:  1.  History of chronic pancreatitis with pseudocyst formation. Per old      records, Brian [name] has a history of a distal pancreatectomy for a chronic      pancreatic fistula discovered in March 2001 and Brian [name] has had numerous      problems with recurrent fluid collections. Brian [name] has undergone CT-guided      drainage of these fluid collections in the past. It was also thought Brian [name]      may have a component of gallstone pancreatitis and therefore underwent a      cholecystectomy in November 2004. Since that time Brian [name] has done relatively      well and  has not been admitted to the hospital. However, I do note Brian [name]      has had follow-up MRCPs and CT scans done in 2005 for what Brian [name] states      were recurrent problem with vomiting. These scans were done as an      outpatient so I do not have further information available to me at this      time.  2.  Insulin-dependent diabetes mellitus.  3.  Dyslipidemia with history of markedly-elevated triglyceride levels.  4.  Hypertension.  5.  Peptic ulcer disease.  6.  Status post cholecystectomy in November 2004 as above.  7.  Status post splenectomy.   ALLERGIES:  ASPIRIN causes peptic ulcer disease.   MEDICATIONS:  1.  Norvasc 10 mg daily.  2.  Insulin 70/30, 30 units b.i.d.  3.  Gemfibrozil 600 mg b.i.d.   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  Brian [name] smokes about one pack of cigarettes per day for the past 20 years. Brian [name]  denies any alcohol use.   FAMILY HISTORY:  His father has coronary artery disease, heart trouble,  hyperlipidemia, and diabetes. His mother died of lymphoma.   REVIEW OF SYSTEMS:  Ten-point review of systems is negative except for as  described above. In addition, Brian [name] noted dark urine as well as low-grade  fevers.   PHYSICAL EXAMINATION:  VITAL SIGNS:  Temperature 98.5, pulse 118, blood  pressure 175/118 which came down to 155/96, respiration rate 20, saturating  94% on room air.  GENERAL:  Brian [name] is pleasant and surprisingly in no apparent distress.  NECK:  Supple. There is no lymphadenopathy.  HEART:  Regular rate and rhythm with no murmurs.  LUNGS:  Clear to auscultation bilaterally.  ABDOMEN:  Soft, minimally tender to deep palpation in the epigastrium. Brian [name]  has hypoactive bowel sounds and no rebound or guarding.  EXTREMITIES:  No edema.  NEUROLOGIC:  Grossly nonfocal.   LABORATORY DATA:  White blood cell count 26.1 with 78% neutrophils and an  MCV of 87,  hematocrit of 49.7, and platelet count 201. Sodium 131, potassium  4.3, chloride 96, bicarb 19, BUN 15, creatinine 1.4, glucose 350, calcium  8.8. INR 0.9, PTT 33. Total bilirubin 6.4, alkaline phosphatase 292, AST  314, ALT 191, total protein 7.0, albumin 3.2, amylase 1086, lipase 1405.   ASSESSMENT AND PLAN:  A 50 year old male with insulin-dependent diabetes  mellitus, chronic pancreatitis with pseudocysts, hypertriglyceridemia,  status post cholecystectomy, now with biliary obstructive pattern on liver  function tests and elevated lipase.   1.  Elevated liver function tests and lipase. Given his complicated history,      it makes it slightly more difficult to pin down the etiology of his      hyperbilirubinemia. I doubt that this obstructive pattern is secondary      to the pancreatis alone, and retained stone or even biliary leak would      be extremely atypical 2 years out from cholecystectomy. However, it is      possible that Brian [name] does have a biliary stricture. We will order an MRCP to      further evaluate. Unfortunately, his general surgeon, Dr. Ninfa Linden, is      no longer here in [place], so we will go ahead and consult general      surgery to assist in the evaluation of these elevated LFTs and chronic      pancreatitis in this complicated patient. His physical exam is somewhat      underwhelming which is reassuring. However, given his reports of low-      grade fevers as well as leukocytosis will start empiric therapy with      Zosyn as there is a question that Brian [name] could have an infected pancreatic      fluid collection. His exam is not consistent with cholangitis. The      patient will be n.p.o. for now. We will continue generous IV fluids.  2.  Diabetes mellitus. Since Brian [name] is n.p.o., we will hold his standing insulin      for now and evaluate sliding scale requirements. I suspect Brian [name] will need     at least one-third of his usual insulin regimen given that his glucose      is  greater than 300 on admission, even though Brian [name] has been able to  keep      little food down over the past 24 hours. Will give 10 units of regular      insulin now.  3.  Hypertriglyceridemia. Will recheck a fasting lipid profile in the      morning. This is a longstanding problem for him. Will continue his      gemfibrozil. Treatment with insulin should help with this as well. It is      likely      contributing to his chronic pancreatitis and Brian [name] will need intensive      lipid-lowering therapy.  4.  Hypertension. Resume home medications.  5.  History of peptic ulcer disease. Will use Protonix for GI prophylaxis           ______________________________  Lenord Fellers, MD     RAR/MEDQ  D:  07/11/2005  T:  07/11/2005  Job:  [id number]

## 2011-02-22 NOTE — Consult Note (Signed)
NAMEJIOVONNI, Brian [name]                ACCOUNT NO.:  [id number]   MEDICAL RECORD NO.:  [id number]          PATIENT TYPE:  INP   LOCATION:  0158                         FACILITY:  [facility]   PHYSICIAN:  Donato Heinz, M.D.DATE OF BIRTH:  07-Apr-1961   DATE OF CONSULTATION:  07/14/2005  DATE OF DISCHARGE:                                   CONSULTATION   REASON FOR CONSULTATION:  Acute renal failure.   HISTORY OF PRESENT ILLNESS:  Brian [name] is a 50 year old white male with past  medical history significant for insulin-dependent diabetes mellitus,  hypertension and chronic pancreatitis with a history of a pseudocyst who was  admitted on 07/11/05 with intractable nausea, vomiting, fever and abdominal  pain, was noted to have elevated LFT's, amylase, lipase, and triglycerides  upon admission.  The patient was admitted for IV fluids, bowel rest and  treatment of his pancreatitis.  During this hospitalization, he has also  been noted to have a rise in his serum creatinine.  The trend is as follows:  On July 11, 2005 his creatinine was 1.4, July 12, 2005 it was 3.2,  July 13, 2005 3.5 and today, July 14, 2005 is 3.2.  Of note, patient  received a CT scan with IV contrast before noon on July 12, 2005.  His  creatinine had been 0.8 the night before down from the 1.4.  However, it was  elevated prior to the CT scan.  The CT scan noted a small left kidney and a  right renal calculus.  I have been asked to help further evaluate and manage  his acute renal failure.   Of note, Brian [name] has been evaluated by Dr. Lonna Cobb approximately  six years ago in our practice for non-nephrotic range proteinuria which had  improved with the use of what appears to be an ACE inhibitor or ARB.  However, he has since been taken off those medications a couple of years ago  and has never been referred back to our practice.  He does note some frothy  urine specifically in the morning but otherwise denies  any hematuria,  dysuria, pyuria or flank pain.  He has also had kidney stones four times in  the past, the last one required laser surgery and was performed in 2000 by  Dr. Nelida Gores.   ALLERGIES:  Aspirin.   PAST MEDICAL HISTORY:  1.  Insulin-dependent diabetes mellitus diagnosed in 2005.  2.  Dyslipidemia.  3.  Chronic pancreatitis with pseudocyst, status post cholecystectomy when      it was felt to be possible gallstone pancreatitis back in November of      2004.  4.  Peptic ulcer disease.  5.  Status post cholecystectomy in November of 2004.  6.  Status post splenectomy.  7.  Proteinuria since at least 1998.  8.  History of nephrolithiasis x4, last intervention was in 2000 by Dr.      Nelida Gores.   CURRENT MEDICATIONS:  1.  Norvasc 10 mg a day.  2.  Insulin 70/30, 30 units b.i.d.  3.  Gemfibrozil 600 mg b.i.d.  ADMISSION MEDICATIONS:  1.  Gemfibrozil 600 mg b.i.d.  2.  Norvasc 10 mg a day.  3.  TriCor 145 mg a day.  4.  Insulin sliding scale.  5.  Labetalol 100 mg b.i.d.  6.  Protonix 40 mg a day.  7.  Zosyn 3.375 gm IV q.6h.  8.  Tylenol p.r.n.  9.  Dilaudid p.r.n.  10. Zofran and Phenergan p.r.n.   FAMILY HISTORY:  Mother died at age 22 from [place] was complicated by  pneumonia.  His father is alive at age 73, has heart disease, hyperlipidemia  and diabetes mellitus.  He has five brothers and one sister all alive and  well.  No family history of kidney disease.   SOCIAL HISTORY:  He was married in July of this year.  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  He has a 20-pack year tobacco history and currently smokes  a pack a day, denies any heavy alcohol use and quit several years ago,  denies any drug use.  Is originally from [place].   REVIEW OF SYSTEMS:  At the present time, he continues to have epigastric  pain with occasional nausea, no vomiting. HEENT: Noticed yellow  discoloration of his eyes the day of admission.  CARDIAC: No chest  pain,  palpitations.  PULMONARY: No shortness of breath, hemoptysis, productive  cough.  GI: As per history of present illness, had some nausea and vomiting  initially with abdominal pain, currently has epigastric pain, no  hematochezia, melena, or bright red blood per rectum.  GU: No dysuria,  pyuria, hematuria, urgency, frequency or hypertension.  RHEUMATOLOGIC: No  arthralgias, myalgias.  DERMATOLOGIC: No rashes, lumps or bumps.  HEMATOLOGIC: No abnormal bleeding or bruising.  All other systems are  negative.   PHYSICAL EXAMINATION:  GENERAL: Well-developed, well-nourished man in no  apparent distress. Temperature 99, pulse 100, blood pressure of 150/90,  respiratory rate of 20.  Intake 5.8 liters, output 3.4 liters. Urine output  is approximately 1500 mL per shift.  HEENT: Head is normocephalic and atraumatic.  Pupils equal round and  reactive to light.  Extraocular movements are intact. He has bilateral  icterus.  Neck: Oropharynx was without lesions, supple, full range of  motion, no lymphadenopathy, no bruits.  LUNGS: Clear to auscultation and percussion bilaterally, no rales or  rhonchi.  CARDIOVASCULAR: Regular rate and rhythm with no murmurs are appreciated.  ABDOMEN: Normal active bowel sounds, soft, had some epigastric tenderness,  no guarding or rebound.  EXTREMITIES: No edema, no embolic changes.   LABORATORY DATA:  Sodium of 132, potassium 4, chloride 105, CO2 17, BUN 31,  creatinine 3.2, glucose 73.  Calcium of 8.5, albumin 2, AST 90, ALT 80, alk  phos 325.  Triglycerides of 1315.  White blood cell count 24.2, hemoglobin  13.2, platelets 204.  His urinalysis on admission had greater than a gram of  glucose.  He had positive ketones, moderate blood, greater than 300 protein.  Ultrasound showed mild hydro on the left, no hydro on the right with some  renal calculi present.   ASSESSMENT AND PLAN: 1.  Acute renal failure.  Patient has pancreatitis with nausea, vomiting  and      intravascular volume depletion. Upon admission, this was further      compounded by IV contrast for CT scan.  Likely this is a combination of      ischemic acute tubular necrosis and contrast nephropathy.  Patient is at      increased  risk for contrast given his history of diabetes mellitus and      volume contracted state.  He also has known history of proteinuria even      though this has been non-nephrotic in the past and now has an ultrasound      which showed nephrolithiasis and a hydro on the left.  We will need to      continue to follow the serum creatinine, urine output and will need to      repeat the ultrasound later in the week to verify that the      hydronephrosis has resolved, if not, we will need urologic evaluation.      Continue with IV fluids and supportive care for now.  There was no      history of nonsteroidals, Cox-2 inhibitors, ACE inhibitors or      angiotensor receptor blockers.  Continue with this current regimen and      hopefully his renal function will continue to improve.  It is already      decreased from 3.5 to 3.2.  We will check it again in the morning.  2.  Metabolic acidosis secondary to #1.  We will continue to follow.  3.  Pancreatitis. He is still with some discomfort.  His MRCP was negative      and this is likely due to his hypertriglyceridemia.  He is on double      therapy for this.  Plan for gastrointestinal and primary service.  4.  Hypertension. Blood pressure is stable, continue with his current      regimen.  5.  Proteinuria.  This has been longstanding.  Work up in the past has been      negative. Once he is more stable and his pancreatitis has resolved, he      will require another 24 hour urine collection to quantify his      proteinuria.  He may need further work up.  Thank you for this consult      and will continue to follow along.           ______________________________  Donato Heinz, M.D.     JC/MEDQ  D:   07/14/2005  T:  07/15/2005  Job:  [id number]

## 2011-02-22 NOTE — Consult Note (Signed)
NAMELUCCA, Brian [name]                ACCOUNT NO.:  [id number]   MEDICAL RECORD NO.:  [id number]          PATIENT TYPE:  INP   LOCATION:  0158                         FACILITY:  [facility]   PHYSICIAN:  Parke Poisson. Gegick, M.D.DATE OF BIRTH:  [birth year]   DATE OF CONSULTATION:  DATE OF DISCHARGE:                                   CONSULTATION   HISTORY:  This is a 50 year old man who presented to the hospital with an  episode of pancreatitis.  The patient has a history of having an episode of  pancreatitis in the year 2000.  He had a chronic fistula which was treated  in 2001 with surgical procedure.  He also presented with another episode of  pancreatitis which was related to gallstones and underwent a cholecystectomy  in 2004.  He presents now with another episode of pancreatitis.   He has a history of diabetes mellitus which was discovered in 2002.  This  was after his episode of pancreatitis.  He states that he had some vomiting,  and he did not feel right when his diagnosis of diabetes was made, and his  glucose was 565.  He was treated with an injection of insulin and sent home.  His sugar improved.  There is no definite history of type 1 diabetes, and he  has not been treated with oral agents.  The patient has been followed while  he has been taking his 70/30 insulin at a dose of 30 units twice a day.  His  A1C when he presented to the emergency room was 9.3%.  He monitors his  sugars periodically, and apparently that has been high most of the time.  He  also has had his triglycerides monitored, and he states that his  triglycerides were in the several-thousand range several months prior to  this admission when he was being followed as an outpatient.   He has severe hypertriglyceridemia on admission.  His triglycerides were  3268 on admission.  His cholesterol was 683.  He has an HDL of 13.  He has  been receiving gemfibrozil at 600 mg b.i.d. and his therapy was not changed  with his  increased levels of triglycerides.   PAST MEDICAL HISTORY:  Essentially as noted above.  He has had a  cholecystectomy in 2004.  Pancreatitis in 2001.  Diabetes onset.   MEDICATIONS PRIOR TO ADMISSION:  Include Norvasc and also his gemfibrozil  and 70/30 insulin at 30 units b.i.d.   PERSONAL HISTORY:  He smokes one pack per day.  He denies excessive amounts  of alcohol but states he drinks 2-3 beers, maybe more, over the weekend.  He  is not allergic to any medications, although he was told that he might  develop GI bleeding with aspirin and was advised to refrain from it.   FAMILY HISTORY:  The mother died of lymphoma.  Father died at the age of 23.  He had diabetes.   REVIEW OF SYSTEMS:  His weight has decreased recently.  CARDIOVASCULAR/RESPIRATORY:  No complaints.  GI:  See above.   PHYSICAL EXAMINATION:  GENERAL:  This is a well-developed man who appears  markedly jaundiced.  NECK:  Supple.  No bruits are noted.  LUNGS:  Clear.  CARDIOVASCULAR:  Regular.  ABDOMEN:  Soft and moderately distended.  Mild tenderness is present.  EXTREMITIES:  Dorsalis pedis pulses are palpable.  No pedal edema is  present.   IMPRESSION:  1.  Diabetes mellitus with poor control (probably type 2 diabetes).  2.  Severe hyperlipidemia with elevated triglycerides, cholesterol, and      suppressed HDL.  3.  History of hypertension.   DISCUSSION:  This patient fits the characteristics of the metabolic  syndrome.  More than likely, he has type 2 diabetes and will have to be  treated for type 2 diabetes.  __________ would be the agent to be used.  Also, reduce his insulin resistance, improve his HDL, and this would be  started at a later date once his liver function studies improve.  At this  time, his glucoses are approximately 100-130.  He is not eating now, and  this will be continued at his current therapy.  Once he starts eating and  his glucoses start changing, then more aggressive treatment  will be started  with more complex therapies, including a multidose insulin therapy.  The  hypertriglyceridemia is probably a direct reflection of his poor diabetes  control, and we should be able to improve that.  He currently is taking both  gemfibrozil and fenofibrate, and one of these needs to be discontinued, and  I have elected gemfibrozil and will continue the fenofibrate because this  will also benefit his cholesterol.  In addition, I will start [place] at 1 gm  2 b.i.d., which should also help reduce the triglycerides.   Thank you for the opportunity in seeing this patient.           ______________________________  Parke Poisson. Electa Sniff, M.D.     CGG/MEDQ  D:  07/15/2005  T:  07/15/2005  Job:  [id number]

## 2011-02-22 NOTE — H&P (Signed)
[facility]  Patient:    Brian [name], Brian [name]                       MRN: [id number] Adm. Date:  [id number] Attending:  Vira Agar CC:         Knox Saliva, M.D.  Birdena Jubilee, M.D.   History and Physical  HISTORY OF PRESENT ILLNESS:  Patient well-known to my partner, Knox Saliva, with a long complicated history of ulcers, pancreatitis, pancreatic pseudocyst, and pancreatic fistula requiring two surgeries and some percutaneous stents placed, who has been on p.r.n. Vicodin.  He has had no GI symptoms.  Yesterday Dr. Nelida Gores proceeded with lithotripsies and placed the stents for kidney stones.  He did take a Vioxx and began having melanotic stools last night, and this morning with increased weakness and presented to the emergency room where Dr. Lacinda Axon evaluated him and found him to be guaiac-positive.  Hemoglobin had dropped from roughly 11 to 6 and asked me to admit the patient for Dr. Vladimir Faster.  The patient has passed one formed black stool since he has been here.  He has no other specific complaints.  PAST MEDICAL HISTORY:  Pertinent as above for the pancreatitis, pancreatectomy, and cyst surgery as well as the percutaneous drainage.  He also has had kidney stones, gout, high blood pressure, and knee arthritis.  HOME MEDICATIONS:  Norvasc, allopurinol, Accupril, Lopid, and Vioxx.  ALLERGIES:  ASPIRIN he says which he does not take due to the ulcers.  SOCIAL HISTORY:  He has smoked in the past, but has not had any alcohol in 15 years.  FAMILY HISTORY:  Noncontributory.  REVIEW OF SYSTEMS:  Negative, except above.  PHYSICAL EXAMINATION:  VITAL SIGNS:  See chart.  Slightly tachycardic with a heart rate of 121, blood pressure 116/68, respirations 18.  HEENT:  Sclerae nonicteric.  NECK:  Supple without obvious adenopathy.  LUNGS:  Clear.  HEART:  Regular rate and rhythm.  ABDOMEN:  Soft and nontender.  RECTAL:  Obviously  melena, guaiac-positive per Dr. Lacinda Axon.  LABORATORY:  See chart.  Pertinent for an increased BUN and decreased hemoglobin.  PT 14.7, PTT 31.  Hemoglobin 6.9, which was 11.4 on January 23, 2001.  His BUN was normal on January 23, 2001, as well.  ASSESSMENT: 1. Upper gastrointestinal bleed in a patient with history of ulcers on p.r.n.    Vioxx. 2. Complicated history of pancreatic fistulas, pancreatitis, pancreatic cyst    surgery and drainage. 3. History of kidney stones, currently with stent. 4. High blood pressure, gout, increased triglycerides and knee problems.  PLAN:  I have discussed no nonsteroidals with the patient and his mother in the future.  If we documented healing, could possibly try them again as long as we use pump inhibitors and possibly even Cytotec to prevent problems, but it would be easier avoid.  In the meantime, we will go ahead and transfuse. The risks, benefits, methods of endoscopy were discussed.  Will proceed p.r.n. signs of active bleeding or at 4 p.m. after he gets the blood, but further workup, plans, and diet advanced, etc., pending results at endoscopy. DD:  01/28/01 TD:  01/28/01 Job: [id number]

## 2011-02-22 NOTE — Discharge Summary (Signed)
Brian [name], Brian [name]                          ACCOUNT NO.:  [id number]   MEDICAL RECORD NO.:  [id number]                   PATIENT TYPE:  INP   LOCATION:  2040                                 FACILITY:  [facility]   PHYSICIAN:  Kerry Hough., M.D.         DATE OF BIRTH:  1961/10/06   DATE OF ADMISSION:  06/02/2003  DATE OF DISCHARGE:  06/04/2003                                 DISCHARGE SUMMARY   FINAL DIAGNOSES:  1. Chest pain of uncertain etiology, myocardial infarction ruled out.     Catheterization showed no significant obstructive coronary artery     disease.  2. Type 2 diabetes mellitus.  3. Hypertension.  4. Obesity.  5. History of gout.  6. History of renal stones.  7. History of gastrointestinal bleeding.   PROCEDURE:  Cardiac catheterization and CT scan of the chest.   HISTORY:  A 50 year old male with history of hypertension, cigarette abuse,  and diabetes following resection of tail of the pancreas following a  pseudocyst. He had the onset of dull substernal chest discomfort lasting 10  to 15 minutes. Had recurrence of the symptoms associated with sweating that  resolved. He was admitted to rule out a myocardial infarction. Please see  the previously dictated history and physical for remainder of the details.   HOSPITAL COURSE:  Laboratory data shows a white count of 17,200, hemoglobin  of 15, hematocrit 42. PT and PTT were normal. Chemistry panel showed a  sodium of 132, potassium 4.3, chloride 106, glucose 207, BUN 15, and  creatinine of 1.6. Liver enzymes were normal. CPK-MB was negative, and  troponin was less than 0.01. Cholesterol was reported at 574 with  triglycerides of 2,413, HDL of 25. EKG showed nonspecific ST and T wave  abnormality in the high lateral leads. Chest x-ray was unremarkable.   The patient was brought in and was placed on antihypertensive medications.  He underwent chest x-ray that was unremarkable. He had a catheterization  done on  June 03, 2003 showing tortuous coronary arteries with minimal  irregularities noted. His blood pressure was elevated and had normal  ventricular function. He had no evidence of dissection or pulmonary embolus  on CT scanning of the chest. He was elected to go home the next day on  medical therapy.   DISCHARGE MEDICATIONS:  1. Norvasc 10 mg.  2. Lopressor 50 b.i.d.  3. Hydrochlorothiazide 25 mg daily.  4. Accupril 20 mg daily.  5. Humulin 70/30 30 units twice a day.  6. Prevacid 30 mg daily.    ACTIVITY:  He is to exercise and walk daily. He is to call the office for an  appointment in one week and is to call if there are problems. He is  instructed to followup with his primary care doctor also.  Kerry Hough., M.D.    WST/MEDQ  D:  06/23/2003  T:  06/25/2003  Job:  [id number]   cc:   Tiana Loft, M.D.  [place], [place]

## 2011-02-22 NOTE — Discharge Summary (Signed)
[facility]  Patient:    Brian [name], Brian [name]                       MRN: [id number] Adm. Date:  [id number] Disc. Date: [id number] Attending:  Orvis Brill CC:         Coralie Keens, M.D.  Einar Crow, M.D.   Discharge Summary  DISCHARGE DIAGNOSES: 1. Bleeding duodenal ulcer. 2. History of pancreatic pseudocyst status post distal pancreatectomy and    fistula complication which has resolved. 3. History of kidney stones currently with a stent in place. 4. Hypertension. 5. Gout. 6. Hypertriglyceridemia.  HISTORY OF PRESENT ILLNESS:  Brian [name] who is well known to our practice because of his history of pancreatitis, pancreatic pseudocyst surgery, and fistulas, also has a history of a duodenal ulcer in the past which was CLO negative.  The day before admission, he underwent lithotripsy for kidney stones with placement of stent.  He was taking Vioxx and began having melena. He presented to the emergency room with a hemoglobin of approximately 6 and was admitted for evaluation of this transfusion and careful observation.  PAST MEDICAL HISTORY:  Outlined in multiple previous notes.  ADMISSION PHYSICAL EXAMINATION:  VITAL SIGNS:  He was tachycardic to 121 with a blood pressure of 116/68 but without orthostatic changes.  HEENT:  Sclerae were anicteric.  ABDOMEN:  Obese with healed scars and nontender.  RECTAL:  Exam revealed melena.  LABORATORY TESTS:  Prothrombin time 14.7, hemoglobin 6.9 down from 11.4 on April 19.  HOSPITAL COURSE:  The patient underwent transfusion of three units of blood and urgent endoscopy.  A small hiatal hernia was found and a moderate duodenal ulcer that was not actively bleeding.  CLOtest was done and was negative. Patient was treated with Protonix and observed for two days. On April 26, he was having dark but brown stool and had only had one bowel movement per day for the last two days with no abdominal pain  or vomiting.  Hemoglobin was 8.8 similar to the day prior.  He wished to go home and not receive any further transfusions but will maintain phone contact if there are any problems.  DISCHARGE CONDITION:  Improved.  FOLLOW-UP:  To be seen in the office Monday and he knows to call me in the interim if there are any problems.  DISCHARGE MEDICATIONS:  Protonix 40 mg p.o. b.i.d. before meals.  He also takes Norvasc, allopurinol, Accupril, and Lopid.  Vioxx is discontinued and he must avoid nonsteroidals in the future. DD:  01/30/01 TD:  02/01/01 Job: [id number]

## 2011-02-22 NOTE — Op Note (Signed)
[facility]  Patient:    Brian, [name]                       MRN: [id number] Proc. Date: 12/07/99 Adm. Date:  [id number] Attending:  Coralie Keens A                           Operative Report  PREOPERATIVE DIAGNOSIS:  Pancreatic pseudocyst.  POSTOPERATIVE DIAGNOSIS:  Pancreatic pseudocyst.  PROCEDURES: 1. External drainage of pancreatic pseudocyst. 2. Splenectomy.  SURGEON:  Coralie Keens, M.D.  ASSISTANT:  Ward Givens, M.D.  ANESTHESIA:  General endotracheal anesthesia.  ESTIMATED BLOOD LOSS:  550 cc.  INDICATIONS:  [name] is a pleasant 50 year old gentleman who presented after a CT scan of the abdomen and pelvis performed for kidney stones found the  patient to have a 13 x 12 cm pseudocyst in the tail of the pancreas.  The patient had no previous history of pancreatitis, alcohol use, or gallbladder problems.  Therefore the decision was made to proceed to the operating room for exploration and possible cyst excision with distal pancreatectomy.  FINDINGS:  The patient was indeed found to have an extremely large pseudocyst.  Dissection on the posterior wall of the stomach inadvertently caused entry into the pseudocyst, and clear drainage was aspirated from the cyst.  A biopsy of the cyst cavity was performed and was consistent with a benign pseudocyst.  No evidence f carcinoma was identified.  Secondary to the intense inflammation around the pancreas, no definite pancreatic plane could be identified for a distal pancreatectomy.  A splenectomy, however, needed to be performed.  At this point, the decision was made to proceed with external drainage through a #19 [place] Blake drain.  PROCEDURE IN DETAIL:  The patient was brought to the operating room and identified as Brian [name].  He was placed supine on the operating table, and general anesthesia was induced.  His abdomen was then prepped and draped in  the usual sterile fashion.  Using a #10 blade, a left subcostal incision was performed, carrying it over to the right side of the abdomen and to the left flank.  The incision was carried down through the fascial layers and muscle with the electrocautery.  The peritoneum was then identified and opened the entire length of the incision.  Upon entering the abdomen, the large pseudocyst was identified in the lesser sac pushing the colon inferiorly and the stomach superiorly.  His spleen was found to be quite enlarged as well.  Blunt dissection was used to elevate the spleen up into the abdominal incision.  Posterior attachments were taken down bluntly along with the electrocautery.  At this point, dissection was carried out through the lesser sac in order to identify the anterior surface of the cyst. While dissecting the cyst off of the posterior wall of the stomach, the cyst cavity was inadvertently entered.  A large amount of clear fluid was subsequently aspirated from the cyst.  At this point, a large biopsy of the anterior wall of the cyst was performed and sent to pathology for frozen section.  Frozen section revealed findings consistent with a benign pseudocyst.  No evidence of carcinoma was identified.  The entire anterior surface of the cyst was therefore removed,  allowing entry into the total cyst cavity.  At this point, the surface of the cavity was identified, and no pancreatic fistula or duct could  be identified. Still, attempts were made to identify healthy pancreas in order to perform a distal pancreatectomy.  Because of the large nature of the spleen and a tear in the capsule, a splenectomy was performed.  The vessels to the hilum were identified and clamped with clamps and controlled with 2-0 silk ties.  Once all attachments, including the ______, were taken down with ties and silk sutures, the spleen was completely removed and sent to pathology for identification.   Again, the pseudocyst cavity was explored, and again, no pancreatic fistula could be identified. Dissection was carried out at the tail of the pancreas in order to identify healthy pancreatic tissue.  But again, this remained difficult owing to the intense inflammatory process.  It was very difficult to identify the superior mesenteric vessels as well.  At this point, the decision was therefore made to proceed with external drainage.  Two separate skin incisions were therefore made, and two #19 [place] Blake drains were brought into the field.  One was brought out the side wall and placed into the right upper quadrant.  The other drain was placed into the cyst cavity, along with a large amount of omentum.  Hemostasis appeared to be achieved.  The abdomen was then thoroughly irrigated with copious amounts of normal saline.  Next, the peritoneum and posterior fascia to the midline, and then into the right abdomen, were closed with running #0 Prolene suture.  The anterior fascia, likewise, was closed ______ with the midline with an #0 Prolene suture s well.  The skin then was then thoroughly irrigated, and the skin was closed with skin staples.  The drains were sewn in place with separate 2-0 nylon sutures. he patient tolerated the procedure well.  All sponge, needle, and instrument counts were correct at the end of the procedure.  The patient was then extubated in the operating room and taken in stable condition to the recovery room. DD:  12/07/99 TD:  12/09/99 Job: [id number]

## 2011-02-22 NOTE — H&P (Signed)
NAME:  Brian [name], Brian [name]                          ACCOUNT NO.:  [id number]   MEDICAL RECORD NO.:  [id number]                   PATIENT TYPE:  INP   LOCATION:  5707                                 FACILITY:  [facility]   PHYSICIAN:  Aquilla Hacker, M.D.               DATE OF BIRTH:  Dec 24, 1960   DATE OF ADMISSION:  08/11/2003  DATE OF DISCHARGE:                                HISTORY & PHYSICAL   CHIEF COMPLAINT:  Persistent nausea and vomiting with abdominal pain,  epigastric started today with no resolution with underlying acute episode of  pancreatitis on top of chronic.   HISTORY OF PRESENT ILLNESS:  [name] is a 50 year old white male  married. He has acute abdominal pain, persistent nausea and vomiting, and  came to the emergency room. He found that he has abnormal enzymes as well as  white count by the ER physician. Called Dr. Tomi Bamberger who called for the patient  to be admitted to the hospital as unassigned.   PAST MEDICAL HISTORY:  He has been admitted on November 11, 2000 with a  history of nausea and vomiting. He has a history of distal pancreatectomy  for chronic pancreatic fistula and he also has a pancreatic pseudocyst  discovered in March 2001 and underwent drainage with a splenectomy. He had  recurrent fluid collection and ERCP was performed in November and early  December 2001. He underwent a distal pancreatectomy. The patient also has a  history of abdominal fluid collection and he had a history of left ureteral  stone at the time of discharge on November 14, 2000. At that time it was  described that he had a large fluid collection around his pancreas in the  left upper quadrant and he had a left ureteral stone 1 cm in diameter with a  left hydronephrosis.  He was admitted to the hospital at that time and  underwent CT scan guided drainage of the fluid collection as well as he was  seen by a urologist. His creatinine was 20 at that time. The creatinine was  1.5 and  BUN was 20. Prior to that admission amylase was 53. Previous old  medical records are reviewed from March 2002 the discharge showed pancreatic  pseudocyst and he had chronic duodenal ulcer with hemorrhages, hypertension,  and gout. At that time the discharge diagnosis was pancreatic pseudocyst as  well as gastrointestinal hemorrhages. He was also admitted at that time by  Dr. Philipp Ovens.   FAMILY HISTORY:  Noncontributory.   SOCIAL HISTORY:  He stated that he is not a drinker and he is not a smoker.   ALLERGIES:  No known drug allergies.   CURRENT MEDICATIONS:  1. Norvasc 10 mg daily.  2. Accupril 40 mg daily.  3. Actos unknown dose, questionable 15 or 30.  4. Crestor 2 daily.  5. Allopurinol 300 mg.   EMERGENCY ROOM COURSE:  In  the emergency room on initial admission showed  that his blood pressure was 174/104 and pulse 119. His respiratory rate was  24 and temperature 98. He had received for severe abdominal pain Dilaudid 20  mg IV and Phenergan 12.5 mg IV, which was repeated subsequently due to  nausea and vomiting in the emergency room. The current laboratory in the  emergency room indicated BUN 18, creatinine 1.1, calcium 8.5, and protein  6.5. Albumin 3.5. AST 19, ALT 21, alkaline phosphatase is 106 and bilirubin  0.7. He had white count of 24.4 with a hemoglobin of 19.1 and hematocrit 44.  The platelet count was 328 with the underlying hemoconcentration. Sodium  136, potassium 3.6, chloride 106, carbon dioxide 23, blood sugar 190, BUN of  18 as mentioned. His liver enzymes were within normal limits with AST 19 and  ALT 21. He has been a diabetic for the last three years.   REVIEW OF SYSTEMS:  The patient currently has severe abdominal pain with  nausea and vomiting. Could not review the systems in this situation and was  discussed with his wife and his son. Mainly associated with GI symptoms of  nausea and vomiting. No diarrhea. Epigastric pain. No chest pain.   Genitourinary was no symptoms. Musculoskeletal:  No symptoms. He had  previously two surgeries on the abdomen with a scar.   PHYSICAL EXAMINATION:  VITAL SIGNS:  Blood pressure 174/104 and pulse 119,  respiratory rate 24, and pulse oxygen was 96. His temperature was 98.  HEENT:  Head was normocephalic, atraumatic. Pupils were equal and reactive.  The conjunctivae was pink. The sclerae was anicteric. The oropharynx was  negative.  NECK:  Supple. No JVD, no thyromegaly, no lymphadenopathy.  CHEST:  Clear to auscultation and percussion.  HEART:  PMI in the fifth intercostal space. Normal S1, S2. No S3, S4. No  gallop appreciated.  ABDOMEN:  Epigastric discomfort with an underlying scar and there is no  tenderness on the flank, but mainly in the epigastric area. The x-ray showed  abdominal ileus.  EXTREMITIES:  No pedal edema. Pulses bilateral and symmetrical.  NEUROLOGIC:  Grossly intact.   IMPRESSION:  1. Acute pancreatitis on top of chronic with an underlying history of     pancreatic pseudocyst in the past with the history of fluid collection in     the past.  2. Underlying hypertension, which is currently uncontrolled on admission.  3. Past history of underlying exploratory laparotomy for the pancreatic     pseudocyst.  4. Diabetes mellitus type 2, insulin dependent for the last three years with     the underlying use of 70/30 thirty units b.i.d.  5. Persistent nausea and vomiting.   PLAN:  The patient will be admitted to the hospital for IV fluid as well as  to control pain and blood cultures x2. Primaxin was started q.6h. 500 mg.  Ultrasound of the abdomen and further investigation with a CAT scan will be  done after patient is stable.                                                Aquilla Hacker, M.D.    Marcellina Millin  D:  08/11/2003  T:  08/11/2003  Job:  [id number]

## 2011-02-22 NOTE — Op Note (Signed)
[facility]  Patient:    Brian [name], Brian [name]                       MRN: [id number] Proc. Date: 01/27/01 Adm. Date:  [id number] Attending:  Lamona Curl CC:         Zigmund Gottron. Arelia Sneddon, M.D.  Coralie Keens, M.D.   Operative Report  DATE OF BIRTH:  [birth year]  REFERRING PHYSICIAN:  Zigmund Gottron. Arelia Sneddon, M.D., Coralie Keens, M.D.  PREOPERATIVE DIAGNOSIS:  History of surgery and drainage of recurrent pancreatic pseudocyst.  The patient is now doing well, has had careful followup with Dr. Ninfa Linden.  Incidental discovery of an 11 x 14 mm left mid ureteral calculus at the time of the CT scan done for evaluation of the pseudocyst.  Stent was placed immediately, and the patient had his first ESWL November 27, 2000.  Stone at that time measured 11 x 14 mm.  Second ESWL on January 01, 2001, stone at that time measured 9 x 9 mm.  Recent studies indicate the pseudocyst is gone.  previous drains have been removed.  KUB, however, shows a loose collection of 7 x 7 mm calcifications on the left mid ureter. Plans made for ureteroscopy and holmium laser treatment.  OPERATION: 1. Cystoscopy. 2. Ureteroscopy. 3. Holmium laser treatment of stony fragments within the left mid ureter. 4. JJ stent placement.  SURGEON: Einar Crow, M.D.  ANESTHESIA:  General.  DRAINS:  26 cm 6-French JJ stent.  DESCRIPTION OF PROCEDURE:  The patient was prepped and draped in the dorsal lithotomy position after institution of adequate level of general anesthesia. A well-lubricated 21-French pen endoscope was gently inserted at the urethral meatus.  Normal urethra and sphincter, nonobstructed prostate.  Easily visible JJ stent at the left ureteral orifice with associated bullous edema.  The tip of the JJ stent was grasped and brought out through the urethral meatus. Guidewire was passed into the calices of left kidney.  Position confirmed with injection of contrast.   Guidewire was left in place.  The short 6-French ureteroscope was then inserted alongside the guidewire.  A collection of stony fragments noted within the left mid ureter.  Fragmentation begun with the 360 fiber, setting 0.5.  Stony fragments were gently fragmented throughout the left mid ureter over a period of 20 to 30 minutes.  Ureteroscope was then advanced to the level of the left UPJ.  No proximal fragments could be identified.  There were several 0.5 mm fragments within the ureter.  The ureteroscope was then withdrawn.  A 6-French 26 cm JJ stent was then passed over the indwelling guidewire with excellent pigtail formation.  Bladder was drained.  Cystoscope was removed.  The patient was returned to recovery in satisfactory condition. DD:  01/27/01 TD:  01/27/01 Job: [id number]

## 2011-02-22 NOTE — Op Note (Signed)
Brian [name], Brian [name]                          ACCOUNT NO.:  [id number]   MEDICAL RECORD NO.:  [id number]                   PATIENT TYPE:  INP   LOCATION:  5707                                 FACILITY:  [facility]   PHYSICIAN:  Coralie Keens, M.D.              DATE OF BIRTH:  1961/02/24   DATE OF PROCEDURE:  08/13/2003  DATE OF DISCHARGE:                                 OPERATIVE REPORT   PREOPERATIVE DIAGNOSIS:  Gallstone pancreatitis.   POSTOPERATIVE DIAGNOSIS:  Gallstone pancreatitis.   PROCEDURE:  Laparoscopic cholecystectomy.   SURGEON:  Coralie Keens, M.D.   ASSISTANT:  Georgina Quint, M.D.   ANESTHESIA:  General endotracheal anesthesia.   ESTIMATED BLOOD LOSS:  Minimal.   FINDINGS:  The patient was found to have an acutely inflamed gallbladder.  The gallbladder  base was  difficult to identify secondary to the patient's  obesity. The cystic duct was foreshortened, therefore cholangiogram  was  deferred.   DESCRIPTION OF PROCEDURE:  The patient was brought to the operating room and  identified  as Brian [name]. He was placed supine on the operating table and  general anesthesia was induced. His abdomen was then prepped and draped in  the usual sterile fashion.   Using a #15 blade  a small vertical incision was made above the umbilicus.  The incision was carried down to the fascia which was then opened with a  scalpel. A hemostat was used to pass through the peritoneal cavity. Next a 0  Vicryl pursestring suture  was placed around the fascial opening. The [place]  port was placed through the opening and  insufflation of the abdomen was  begun.   The patient was found to have a gallbladder  that appeared  to be acutely  distended and there was free fluid around the liver. The patient also had  multiple  adhesions in the upper  midline.   A 12-mm port was placed in the patient's epigastrium and two 5-mm ports were  placed in the patient's right side under direct  vision. The gallbladder  was  then grasped and retracted  by the liver bed. Dissection of the base was  quite difficult secondary to the inflammatory process. The cystic artery was  found to be anterior. It was clipped 3 times proximally  and once distally  and transected with scissors.   The cystic duct could then be identified, but it was very difficult to  reach. Therefore  the decision was made to forego cholangiogram. It was  clipped 3 times proximally  and once distally and transected with scissors.  The gallbladder  was then slowly dissected free from the liver bed with  electrocautery. The posterior cystic artery was also clipped.   Once the gallbladder was free from the liver bed, hemostasis was achieved in  the liver bed with electrocautery. The gallbladder  was then placed in an  endosac. The  abdomen was irrigated with a large amount of normal saline.   Secondary to the acute inflammatory nature  of the gallbladder and the  amount of free fluid that was in his abdomen, the decision was made to place  a 19 [place] Blake drain in the gallbladder  fossa. The Blake drain was  placed through the epigastric port and then pulled out through the port  thorough the lateral 5-mm port site and sewn in place with 3-0 nylon suture.  The [place] drain itself was placed in the gallbladder fossa.   The gallbladder in the bag was then pulled out through the incision at the  umbilicus. The 0 Vicryl at the umbilicus  was tied in place to close the  fascial defect. The rest of the ports were removed under direct vision and  the abdomen was deflated. All incisions were then anesthetized with 0.25%  Marcaine and closed with 4-0 Vicryl subcuticular sutures.  Steri-Strips, gauze and tape were then applied.   The patient tolerated the procedure well. All sponge, instrument and needle  counts were correct at the end of the procedure. The patient was then  extubated in the operating room and taken to  the recovery room in stable  condition.                                               Coralie Keens, M.D.    DB/MEDQ  D:  08/13/2003  T:  08/13/2003  Job:  [id number]

## 2011-02-22 NOTE — H&P (Signed)
[place]. [facility]  Patient:    Brian [name], HOUCHEN                       MRN: [id number] Adm. Date:  [id number] Attending:  Coralie Keens A                         History and Physical  CHIEF COMPLAINT:  Nausea and generalized malaise.  HISTORY OF PRESENT ILLNESS:  Aaric Dodgson is a 50 year old gentleman who is now three to four weeks status post distal pancreatectomy for a chronic pancreatic fistula.  He had had a pancreatic pseudocyst discovered in March 2001 and underwent drainage of this with a splenectomy.  He had had recurrent fluid collections and an ERCP performed in late November/early December demonstrated a chronic pancreatic fistula from the tail.  Therefore, he was taken for a distal pancreatectomy.  He initially did well and has had his drains removed but over the weekend developed fever and nausea.  He denied any abdominal pain, shortness of breath, or cough.  He reports he has had no dysuria or hematuria and he is moving his bowels well.  PAST MEDICAL HISTORY:  As above, including past surgical history.  He does have a history of bleeding duodenal ulcer.  MEDICATIONS:  None.  ALLERGIES:  No known drug allergies.  SOCIAL HISTORY:  Does not smoke or drink alcohol.  PHYSICAL EXAMINATION:  GENERAL:  Reveals an obese male in no acute distress.  VITAL SIGNS:  He is afebrile.  His vital signs are stable.  HEENT:  He is anicteric.  His oropharynx is clear.  NECK:  Supple.  LUNGS:  Clear to auscultation bilaterally.  CARDIOVASCULAR:  Regular rate and rhythm.  ABDOMEN:  Soft and obese.  There are no palpable masses.  He has a well-healed Chevron incision without evidence of hernias.  EXTREMITIES:  Warm and well perfused.  LABORATORY DATA:  Patient comes with laboratory data from November 10, 2000 which shows him to have an amylase of 53.  White blood count 20,000, hemoglobin 10.2.  Abdominal CAT scan today demonstrates multiple  large fluid collection surrounding the pancreas and left upper quadrant.  He also has an approximately 9 mm stone in the left mid ureter with hydronephrosis.  IMPRESSION AND PLAN:  A 50 year old gentleman status post distal pancreatectomy with recurrent fluid collections in his abdomen as well as a left ureteral stone.  At this point we will proceed with CT-guided drainage of these fluid collections as well as a urology consult. DD:  11/11/00 TD:  11/12/00 Job: [id number]

## 2011-02-22 NOTE — Discharge Summary (Signed)
[name], [name]                          ACCOUNT NO.:  [id number]   MEDICAL RECORD NO.:  [id number]                   PATIENT TYPE:  INP   LOCATION:  5707                                 FACILITY:  [facility]   PHYSICIAN:  Aquilla Hacker, M.D.               DATE OF BIRTH:  03-03-1961   DATE OF ADMISSION:  08/11/2003  DATE OF DISCHARGE:  08/15/2003                                 DISCHARGE SUMMARY   ADMISSION DIAGNOSES:  1. Acute pancreatitis with underlying history of pancreatic pseudocysts in     the past with a history of fluid collection in the past.  2. History of hypertension.  3. History of exploratory laparotomy for pancreatic pseudocysts.  4. Diabetes mellitus type 2.  5. Persistent nausea and vomiting.   MEDICATIONS ON DISCHARGE:  Augmentin 875 mg p.o. b.i.d., last dose to be on  August 24, 2003.  He is to continue Norvasc, Accupril, Actos, Crestor,  allopurinol as before.  He is supposed to remove his clonidine patch on  Wednesday.  Start his Accupril and Norvasc on Tuesday and remove patch on  Wednesday.  Oxycodone 5 mg 1 or 2 p.o. q.6h. p.r.n.   DIET:  Low fat.  Moderate consistent carbohydrates.   WOUND CARE:  As per Dr. Ninfa Linden.   FOLLOW-UP:  With Dr. Ninfa Linden on Wednesday.  Instructed to call [phone].  Instructed to follow up with Dr. Lavone Orn on August 29, 2003, at 4  p.m.   HISTORY OF PRESENTING ILLNESS:  This is a 50 year old male who has a history  of pancreatic pseudocyst.  He had no previous history of gallstones or  abdominal pain and was found on CT scan while being evaluated for kidney  stones.  He since underwent exploratory laparotomy and drainage of a  pseudocyst complicated by a pancreatic fistula.  He has had intermittent  kidney stones but recently developed abdominal pain, nausea, vomiting.  Presents to the hospital where he was found to have elevated amylase and  lipase and findings on CT that were consistent with a pancreatitis.  Ultrasound was performed showing the patient had gallstones.  He was  admitted by Dr. Tresa Res with findings of bilirubin pancreatitis.   HOSPITAL COURSE:  The patient was admitted and evaluated by general surgery,  Dr. Ninfa Linden, and surgery was scheduled.  The patient underwent laparoscopic  cholecystectomy per Dr. Ninfa Linden, and his course was uneventful.  He did  well and was found to have only coag-negative staph on one blood culture,  felt to be contaminant.  Felt to be stable for discharge by surgery on  postoperative day #2.  He was started on Augmentin and discharged home in  stable condition.   LABORATORY DATA:  At the time of discharge the patient had a WBC of 26,000,  hemoglobin 12, hematocrit 36, platelet count 333.  Sodium 132, potassium  3.9, BUN 11, creatinine 1, glucose 178, albumin  1.8.   He was discharged home by Dr. Ninfa Linden.      Cyril Mourning, D.O.                       Aquilla Hacker, M.D.    ESS/MEDQ  D:  09/22/2003  T:  09/23/2003  Job:  [id number]   cc:   Coralie Keens, M.D.  [id number]  [place]  [place] [address]  Fax: [phone]

## 2011-02-22 NOTE — Discharge Summary (Signed)
[place]. [facility]  Patient:    Brian [name], Brian [name]                       MRN: [id number] Adm. Date:  [id number] Disc. Date: [id number] Attending:  Coralie Keens A                           Discharge Summary  SUMMARY OF HISTORY:  Mr. Raeshon Gantt is a very pleasant 50 year old gentleman, who while undergoing evaluation of abdominal pain had a CAT scan performed which showed the patient to have a large apparent pseudocyst in the tail of his pancreas, measuring approximately 14 cm.  After discussion as an outpatient, the decision was made to proceed with exploration and drainage of the pseudocyst, with possible pancreatectomy.  HOSPITAL COURSE:  The patient was admitted on December 07, 1999 and taken to the operating room where he underwent exploratory laparotomy, with external drainage of his pseudocyst with marsupialization, as well as a splenectomy.  He tolerated the procedure well and was left with two 19 [place] Blake drains in place.  Frozen section on the pseudocyst revealed it to be a benign pseudocyst. Postoperatively the patient was taken to a regular surgical floor.  He initially did well postoperatively except for some early temperatures, and began ambulating well. His pain was controlled initially with an epidural catheter per anesthesia which, again, he tolerated well.  His Jackson-Pratt drain was draining serosanguineous  fluid with the Jackson-Pratt drain in the pancreatic bed draining approximately  100 cc a day.  The other drain in the bed of the spleen drained approximately 40-50 cc a day.  By postoperative day #4, the patients white blood count had increased to 20.5 and his platelet count increased to 696.  Postoperatively he as given vaccinations for H. flu meningococcus and pneumococcus.  He was continued on IV antibiotics and he was started on a clear liquid diet.  He continued to do well. However, his platelet count did increase to  about 1000 in response to his splenectomy.  On the evening of December 13, 1999, the patient developed rectal bleeding, and vomiting with blood and a drop in his blood pressure as well as his hemoglobin from 9.4 postoperatively to 7.9.  At this point the decision was made to transfer the patient to the intensive care unit and obtain a gastroenterology consult.  The patient was seen by Dr. Vladimir Faster and underwent an upper endoscopy which showed him to have a duodenal ulcer.  At this point it was injected by Dr. Vladimir Faster.  The patient tolerated this well and had no further episodes of bleeding throughout the rest of his hospitalization.  He was continued on H2 blockers which were in place early in the course postoperatively.  The patient did receive some fresh frozen plasma.  On December 14, 1999, the patient was started on subcutaneous  ___________ in hopes of decreasing his fistula output further.  The amylase from the J-P had showed it to be approximately 30,000.  The patient slowly continued to progress throughout the rest of his hospitalization and was again started on a clear liquid diet.  His Jackson-Pratt drainage decreased to approximately 24 cc in an 8-hour period.  His other Jackson-Pratt drain had been removed.  By December 17, 1999 the patients hemoglobin was 8.3.  His drainage remained serosanguineous. e was tolerating a diet and oral pain medications, and the decision was made to  discharge the patient to home with the drain in place.  DISCHARGE DIAGNOSES:  1. Pancreatic pseudocyst, status post external drainage of pancreatic pseudocyst,     and splenectomy.  2. Upper gastrointestinal hemorrhage.  DISCHARGE DIET:  Liquids.  DISCHARGE ACTIVITY:  As tolerated.  He is to do no heavy lifting.  DISCHARGE INSTRUCTIONS:  He may shower.  He is to resume his home medications. He will take Percocet for pain.  He is taking Protonix 40 mg p.o. b.i.d. as well. He will record his  Jackson-Pratt drainage daily.  DISCHARGE FOLLOWUP:  He will follow up at the office of Dr. Ninfa Linden in one week post discharge.  He will also follow up at the office of Dr. Vladimir Faster in 1-2 weeks post discharge. DD:  01/08/00 TD:  01/08/00 Job: [id number]

## 2011-02-22 NOTE — Cardiovascular Report (Signed)
   Brian [name], Brian [name]                          ACCOUNT NO.:  [id number]   MEDICAL RECORD NO.:  [id number]                   PATIENT TYPE:  INP   LOCATION:  2040                                 FACILITY:  [facility]   PHYSICIAN:  W. Doristine Church., M.D.         DATE OF BIRTH:  1961/04/13   DATE OF PROCEDURE:  06/03/2003  DATE OF DISCHARGE:                              CARDIAC CATHETERIZATION   HISTORY:  A 50 year old male with diabetes, hypertension, cigarette abuse,  and chest pain suggestive of unstable angina.   PROCEDURE:  Left heart catheterization with coronary angiograms and left  ventriculogram.   COMMENTS ABOUT PROCEDURE:  The patient tolerated the procedure well without  complications.  Had good hemostasis.  Pedal pulses present at the end of the  procedure.  He required labetalol 20 mg for control of hypertension which  occurred during the case.   HEMODYNAMIC DATA:  1. Aorta post contrast 156/106.  2. LV post contrast 156/23.   ANGIOGRAPHIC DATA:  1. Left ventriculogram:  Performed in the 30 degree RAO projection.  The     aortic valve was normal.  The mitral valve was normal.  The left     ventricle appears normal in size.  The estimated ejection fraction is     60%.  Coronary arteries arise and distribute normally.  No significant     coronary calcification is noted.  2. Left main coronary artery somewhat short with a near separate ostium at     the circumflex and LAD.  This system is left dominant.  3. Left anterior descending is a large vessel and extends around the apex.     It gives off two diagonal branches which are tortuous and contains no     significant stenoses.  4. Circumflex coronary artery is a large dominant vessel which appears     normal.  Several marginal branches in the posterior descending artery     arise off of this vessel.  5. Right coronary artery terminates in the proximal inferior wall.  Contains     a large left atrial branch also that  comes off proximally.  No     significant stenoses noted.    IMPRESSION:  1. Normal left ventricular function.  2. No significant coronary artery disease identified.   RECOMMENDATIONS:  Control of blood pressure.  Evaluate for other sources of  chest pain.                                                Kerry Hough., M.D.    WST/MEDQ  D:  06/03/2003  T:  06/03/2003  Job:  [id number]   cc:   Tiana Loft, M.D.  [place], [place]

## 2011-02-22 NOTE — Op Note (Signed)
[name]. [facility]  Patient:    Brian [name], Brian [name]                       MRN: [id number] Proc. Date: 11/11/00 Adm. Date:  [id number] Attending:  Coralie Keens A                           Operative Report  DATE OF BIRTH:  1960-11-30.  REFERRING PHYSICIAN:  Coralie Keens, M.D.  PREOPERATIVE DIAGNOSIS:  A 50 year old male with a complicated surgical history related to recurrent pancreatic pseudocysts.  Patient status post distal pancreatectomy and splenectomy.  Recently admitted by Dr. Ninfa Linden with recurrent pseudocyst and fever.  CT scan of the abdomen obtained at the time of admission showed a 10-11 mm calculus in the midportion of the left ureter with marked left-sided hydronephrosis.  Plans made four double J stent placement.  Patient scheduled for drainage of the pseudocyst tomorrow.  Would anticipate doing ESWL on an elective basis once management of pseudocysts is taken care of.  POSTOPERATIVE DIAGNOSIS:  A 50 year old male with a complicated surgical history related to recurrent pancreatic pseudocysts.  Patient status post distal pancreatectomy and splenectomy.  Recently admitted by Dr. Ninfa Linden with recurrent pseudocyst and fever.  CT scan of the abdomen obtained at the time of admission showed a 10-11 mm calculus in the midportion of the left ureter with marked left-sided hydronephrosis.  Plans made four double J stent placement.  Patient scheduled for drainage of the pseudocyst tomorrow.  Would anticipate doing ESWL on an elective basis once management of pseudocysts is taken care of.  PROCEDURES:  Cystoscopy, retrograde, left double J stent placement.  ANESTHESIA:  General.  DRAINS:  26 cm, 6 French stent.  DESCRIPTION OF PROCEDURE:  The patient was prepped and draped in the dorsal lithotomy position after institution of an adequate level of general anesthesia.  A well-lubricated 21 French panendoscope was gently inserted at the  urethral meatus.  Normal urethra and sphincter.  Nonobstructive prostate. Bladder is mildly trabeculated.  Slit-like orifices in the A position.  Right retrograde showed normal course and caliber of the ureter with normal course and caliber of the pelvis and calyces, with prompt drainage at three to five minutes.  Left retrograde showed thin distal ureter with an 11 mm calcific density below the femoral vessels.  Stone appeared to be tightly impacted within the ureter, with marked proximal hydronephrosis.  Guidewire was negotiated beyond the stone with immediate hydronephrotic drip.  Urine was sent for culture.  Guidewire was then advanced into the dilated pelvis, end-hole catheter was withdrawn, and a 6 [place], 26 cm double J stent was passed over the indwelling guidewire with excellent pigtail formation on guidewire removal.  Cystoscope was then withdrawn.  An 86 French Foley was inserted and left to straight drain, and patient was returned to recovery in satisfactory condition. DD:  11/11/00 TD:  11/12/00 Job: [id number]

## 2011-02-22 NOTE — Discharge Summary (Signed)
[place]. [facility]  Patient:    Brian [name]                       MRN: [id number] Adm. Date:  [id number] Disc. Date: [id number] Attending:  Coralie Keens A                           Discharge Summary  DISCHARGE DIAGNOSIS:  Chronic pancreatic fistula, status post distal pancreatectomy.  HISTORY OF PRESENT ILLNESS:  Brian [name] is a 50 year old gentleman with a chronic pancreatic fistula after exploration for a pancreatic pseudocyst.  The fistula was diagnosed via ERCP at [facility], and options were discussed.  At this point he wishes to proceed with distal pancreatectomy.  HOSPITAL COURSE:  The patient was admitted on October 20, 2000, and taken to the operating room where he underwent a distal pancreatectomy.  He was taken in stable condition to a regular surgical floor with two Blake drains, one in the left upper quadrant and one around the pancreas.  Postoperatively he did quite well and was maintained on IV antibiotics.  His drains were draining serosanguineous fluid.  He was kept n.p.o. with the NG tube down for a couple of days.  On postoperative day #2 he was improving and having flatus, although he did have a temperature to 102, and his white blood count was 39.2.  This was felt to be secondary to his previous splenectomy.  His antibiotics were changed to Primaxin, and he slowly defervesced as his white count continued to decrease.  Over the next few days he was slowly progressed on liquids, and his NG tube was removed.  By October 25, 2000, he was still having some temperatures to 101, but these were lower.  His white blood count continued to decrease.  By October 27, 2000, he was afebrile.  He was feeling well and tolerating a regular diet.  His drainage was minimal from his ______  drains. His left upper quadrant drain was removed and, at this point, the decision was made to discharge the patient to home.  Again, discharge diagnosis is  chronic pancreatic fistula, status post distal pancreatectomy.  DIET:  Regular.  ACTIVITY:  As tolerated.  He is to do no heavy lifting.  MEDICATIONS:  He will take Vicodin for pain.  He will resume his home medications.  FOLLOW-UP:  He will follow up in my office one week postdischarge. DD:  11/04/00 TD:  11/04/00 Job: [id number]

## 2011-02-22 NOTE — H&P (Signed)
[place]. [facility]  Patient:    Brian [name], Brian [name]                       MRN: [id number] Adm. Date:  [id number] Attending:  Coralie Keens A                         History and Physical  IDENTIFICATION/CHIEF COMPLAINT:  The patient is a 50 year old gentleman, status post external drainage of pancreatic pseudocyst, who now comes in with fevers and leukocytosis and abdominal bloating.  HISTORY OF PRESENT ILLNESS:  The patient had his last J-P drain moved about a week ago Monday and started having fevers about 2-3 days later.  He has had increasingly abdominal bloatiness and some nausea and vomiting secondary to what appears to be fullness in are of his pancreas, that he has had before. He finally came in after his fevers went close to 101 and in the emergency room his temperature was up to 101.7.  Blood cultures have been set.  However, in spite of this he has had no fevers, no rigors.  PAST MEDICAL HISTORY:  Significant for hypertension and gout.  MEDICATIONS:  He takes Altace, Procardia XL, and allopurinol.  PAST SURGICAL HISTORY:  Pancreatic pseudocyst drainage approximately a month and a half ago.  REVIEW OF SYSTEMS:  He has not had a bowel movement in several days in spite of taking milk of magnesia and magnesium citrate.  PHYSICAL EXAMINATION:  VITAL SIGNS:  He is febrile with a temperature of 101.7.  His pulse was 132, it is down to 115.  Blood pressure 117/69.  He has O2 saturations of 100%.  HEENT:  Normocephalic, atraumatic.  He is anicteric.  NECK:  Supple.  CHEST:  Clear.  ABDOMEN:  Distended, with some sort of a rosy-red coloring of the inferior flap of his previous abdominal wound.  He has fullness and tenderness with deep palpation in the mid-left upper quadrant.  RECTAL:  Unremarkable.  LABORATORY STUDIES:  His white cell count is 37.1 thousand.  His hemoglobin is 9.7, hematocrit 31.6, platelet count 1065 thousand.  CT scan of  the abdomen is pending.  IMPRESSION:  Possible recurrent pancreatic pseudocyst and/or pancreatic abscess.  Status post external drainage of pseudocyst.  This is a patient of Dr. Ninfa Linden.  He will be admitted to the hospital under Dr. Adela Ports name to be followed up with appropriately for whatever is found on the CT scan.  If he does have a large abscess or fluid collection, it needs to be drained. Percutaneous drainage will be our first option.  We will start him on Zosyn 3.375 mg IV piggyback q.6h. because of his fevers and white count and the likelihood of having an infection. DD:  02/24/00 TD:  02/25/00 Job: [id number]

## 2011-02-22 NOTE — Op Note (Signed)
[place]. [facility]  Patient:    Brian [name], Brian [name]                       MRN: [id number] Proc. Date: 10/20/00 Adm. Date:  [id number] Attending:  Coralie Keens A                           Operative Report  PREOPERATIVE DIAGNOSIS:  Chronic pancreatic fistula.  POSTOPERATIVE DIAGNOSIS:  Chronic pancreatic fistula.  PROCEDURE:  Distal pancreatectomy.  SURGEON:  Coralie Keens, M.D.  ASSISTANT:  Jaci Carrel, M.D.  ANESTHESIA:  General endotracheal anesthesia.  ESTIMATED BLOOD LOSS:  600 cc.  DRAINS:  7 French Blake drain x 2.  INDICATIONS:  Brian [name] is a 50 year old gentleman who underwent exploratory laparotomy for a large pancreatic pseudocyst December 10, 1999. Postoperatively he developed recurrent fluid collections, which were at first thought to be abscesses but later became consistent with recurrent pseudocysts.  In his original surgical procedure, he underwent just external drainage secondary to entering the pseudocyst, which showed no evidence of carcinoma.  Because of continued drainage and need for percutaneous drains, an ERCP was performed recently, which did show a pancreatic fistula from the distal pancreas; therefore, a decision made to proceed with exploration and distal pancreatectomy.  FINDINGS:  The patient was found, indeed, to have chronic fistula at the distal aspect of the pancreas with a large amount of surrounding inflammatory reaction.  DESCRIPTION OF PROCEDURE:  The patient was brought to the operating room and identified as Brian [name].  He was placed supine on the operating room table, and general anesthesia was induced.  His abdomen was then prepped and draped in the usual sterile fashion.  The percutaneous drain was removed prior to this.  Next, his chevron incision from the previous surgery was opened with scalpel.  The incision was taken down through the fascial layers with the electrocautery.  The abdomen  was then opened the entire length of the incision.  Several adhesions to the abdominal wall were then taken down with the electrocautery.  Next, the lesser sac was entered by taking it down with Kelly clamps and 2-0 silk ties.  The distal pancreas was found to be quite hard and indurated from the area of the fistula, and it also was stuck to the back wall of the stomach.  Extensive dissection was carried out in order to free up the entire pancreas.  During the dissection, in order to separate it from the stomach, a piece of stomach had to be removed with the cautery.  The open gastrotomy was then closed with interrupted 2-0 silk sutures.  A serosal tear in the stomach was also controlled with lemberted 2-0 silk sutures as well.  Further dissection was carried out, removing the transverse mesocolon from the intense reactive process.  Following the tail, the pancreas could be identified and was mobilized as well as the neck.  A tract could be developed underneath the neck of the pancreas posterior.  A Penrose drain was placed around the neck for further control.  Dissection was then continued, both superior and inferior to the pancreas, taking down several attachments with clamps and ties.  Several surgical clips were used during this dissection as well.  Finally, the TA60 stapler was brought onto the field.  _____ had been previously used to help close the gastrotomy in the stomach.  The TA60 stapler was then placed around  the neck of the pancreas and fired, creating a staple line across the neck of the pancreas.  Several stay sutures were then placed on the proximal pancreas.  The splenic vein, which was still intact, was controlled with 2-0 silk sutures as well.  The pancreas was the transected with the electrocautery.  Further dissection was then carried out on the distal pancreatic tail to free it up from the retroperitoneum and, again, the intense reactive process.  This again was done  with the cautery as well as clamps and ties.  The entire specimen was then completely removed and sent to pathology for identification.  The proximal end of the pancreas was examined, and no leak was identified.  At this point, the abdomen was irrigated with copious amounts of normal saline.  Two separate skin incisions were then made, and two 19 [place] Blake drains were placed in the abdomen, one in the area of the pancreatic resection and one in the left upper quadrant.  These drains were then sewn in place with 3-0 nylon sutures.  The posterior and anterior fascial layers were then closed with running #1 Novofil sutures.  The skin was then irrigated and closed with skin staples.  The patient tolerated the procedure well.  All sponge, needle, and instrument counts were correct at the end of the procedure.  The patient was then extubated in the operating room and taken in stable condition to the recovery room. DD:  10/20/00 TD:  10/21/00 Job: [id number]

## 2011-02-22 NOTE — Discharge Summary (Signed)
[place]. [facility]  Patient:    Brian [name], Brian [name]                       MRN: [id number] Adm. Date:  [id number] Disc. Date: [id number] Attending:  Ardeth Perfect                           Discharge Summary  HISTORY OF PRESENT ILLNESS:  [name] is a very pleasant 50 year old gentleman who presented approximately four weeks status post distal pancreatectomy which was performed for a chronic pancreatic fistula, and now he was suffering from nausea, generalized malaise.  He denied any abdominal pain or dysuria.  A CT scan of the abdomen performed earlier in the day showed three large fluid collections around his pancreas in the left upper quadrant as well as a left ureteral stone approximately 1 cm in diameter, with a left hydronephrosis.  At this point the plan was to admit the patient to the hospital for CT-guided drainage of the fluid collections as well as urology consult.  His amylase prior to admission was only 53, with a BUN and creatinine of 20 and 1.5.  HOSPITAL COURSE:  Urinalysis consult was obtained from Dr. Nelida Gores, and the patient was taken for a left ureteral stent insertion on November 11, 2000.  CT-guided abscess drainage was performed the following morning, and several drains were placed in the patients fluid collections.  He had an unremarkable course during the rest of his hospitalization, with the drains draining well.  He quickly was taken to a regular diet.  The drainage in his drains showed an amylase of 13,000 consistent with a possible recurrent pancreatic fistula.  Given his stability on abdominal exam, the decision was made to discharge him to home for continued follow-up of his drains as well as his kidney stone as an outpatient.  DISCHARGE DIAGNOSES: 1. Abdominal fluid collections. 2. Possible chronic pancreatic fistula. 3. Left ureteral stone.  DIET:  Regular.  ACTIVITY:  As tolerated.  WOUND CARE:  He was taught  drain care.  MEDICATIONS:  He will continue on Cipro, as well as take Vicodin as needed for pain.  FOLLOW-UP:  He will follow up in my office as well as Dr. Reece Agar office in one week postdischarge. DD:  01/05/01 TD:  01/06/01 Job: [id number]

## 2011-02-22 NOTE — H&P (Signed)
Brian [name], Brian [name]                          ACCOUNT NO.:  [id number]   MEDICAL RECORD NO.:  [id number]                   PATIENT TYPE:  INP   LOCATION:  2040                                 FACILITY:  [facility]   PHYSICIAN:  W. Doristine Church., M.D.         DATE OF BIRTH:  1961-06-01   DATE OF ADMISSION:  06/02/2003  DATE OF DISCHARGE:                                HISTORY & PHYSICAL   HISTORY:  The patient is a 50 year old male with a complicated past history.  He has a known history of hypertension, cigarette abuse and diabetes  mellitus following resection of the tail of his pancreas following a  pseudocyst.  He also has a history of gout.  He has a history of bleeding  ulcers at the time of his previous abdominal surgery.  He, last evening, had  the onset of dull substernal chest discomfort while sitting that lasted 10  to 15 minutes and resolved.  He was able to go to sleep last evening but had  recurrence of chest discomfort today lasting again 10 to 15 minutes,  associated with some sweating that also resolved.  He has had several  episodes like this on and off today, occurring mostly at rest, and he is  admitted at this time through the emergency room to rule out a myocardial  infarction.   He has a history of a pseudocyst in 2001, at which time he had abdominal  pain.  During that admission, he had a gastrointestinal bleed, thought due  to aspirin.  He has had stenting of his pancreas and continued to have a  pseudocyst and had to have revision of a fistula and eventually resection of  the tail of his pancreas and eventually healed up from all of this, but  which left him with diabetes.  The onset of the discomfort occurred while he  was at work this afternoon and had resolved when he came to the emergency  room, reoccurred slightly and he was started on nitroglycerin.   PAST HISTORY:  His past history is remarkable for hypertension and known  history of gout, history  of endoscopy and previous gastrointestinal  bleeding, one due to nonsteroidals and one thought due to aspirin.  He also  has hypercholesterolemia but is not currently taking Lipitor.  He is also on  insulin for diabetes.   CURRENT MEDICINES:  His current medicines include:  1. Accupril.  2. Norvasc.  3. Allopurinol.  4. Humulin 70/30 -- 30 units in the morning and 30 in the evening.   He previously was on cholesterol medicines.   ALLERGIES:  He has no true allergies but did note gastrointestinal bleeding  on ASPIRIN and VIOXX previously.   FAMILY HISTORY:  Father is 38 and had bypass grafting at 67.  Mother died of  cancer and pneumonia in her late 77s.  He has one sister and five brothers.  No premature  history of cardiac disease.   SOCIAL HISTORY:  He has a Financial controller.  He works for [employer], smokes one pack of cigarettes per day, does not use alcohol to  excess, has never been married but does have a live-in friend.   REVIEW OF SYSTEMS:  He has been obese for several years.  He has no diabetic  retinopathy or eye problems, no skin disorders.  He has not lost any  significant weight recently.  He has no known history of pneumonia.  He does  have a previous history of a pancreatic pseudocyst and has had a splenectomy  also previously.  He has had several abdominal explorations following this  and had several hospitalizations in 2001 and 2002.  GENITOURINARY:  He has a  history of ureteral stones and has seen Dr. Nelida Gores with this.  He  has had a duodenal ulcer noted previously and has been transfused for GI  bleeding also in the past.  He does have some mild arthritis and has a  history of gout.  He has no history of stroke or TIA.  Other than as noted  above, the remainder of the review of systems is unremarkable.   PHYSICAL EXAMINATION:  GENERAL:  On examination, he is a pleasant, obese  male who is currently in no acute distress.  VITAL SIGNS:  His  blood pressure is currently 130/80.  Pulse was 90 and  regular.  SKIN:  Skin warm and dry.  ENT:  EOMI.  PERLA.  C&S clear.  Fundi unremarkable.  Pharynx negative.  NECK:  Neck supple without masses.  No JVD, thyromegaly or bruits.  LYMPHATICS:  Lymph nodes normal.  LUNGS:  Lungs clear.  CARDIAC:  Normal S1 and S2.  No S3.  ABDOMEN:  Abdomen soft and nontender.  No mass or hepatosplenomegaly and no  aneurysm.  Midline surgical scars are noted.  EXTREMITIES:  Femoral and distal pulses are present and are 2+.   LABORATORY AND ACCESSORY CLINICAL DATA:  Twelve-lead EKG shows poor R wave  progression, some T wave changes in I and aVL.   Lab data shows an elevation of glucose at 209.  CBC was normal.   IMPRESSION:  1. Chest discomfort with features suggestive of unstable angina pectoris in     a patient with multiple risk factors.  2. History of pancreatic pseudocyst with resection of tail of the pancreas     and splenectomy previously.  3. Insulin-dependent diabetes mellitus.  4. Hypertension.  5. Previous history of hyperlipidemia.  6. History of gout.  7. History of renal stones.   RECOMMENDATIONS:  The patient is admitted and will be placed on heparin.  I  will give him low-dose aspirin, begin beta blocker, check serial enzymes and  EKGs; initial enzymes are negative.  At this point, with multiple risks, he  will likely require cardiac catheterization to evaluate for coronary artery  disease, in view of the pain occurring at rest.  The cardiac catheterization  procedure was discussed with him fully including risks of MI, death, stroke,  bleeding, arrhythmia, dye allergy or renal failure; in addition, risks of  possible stenting and angioplasty were discussed with him and he is  agreeable and willing to proceed.  Kerry Hough., M.D.   WST/MEDQ  D:  06/02/2003  T:  06/03/2003  Job:  [id number]   cc:   Deborha Payment.D.

## 2011-02-22 NOTE — Consult Note (Signed)
Brian [name], Brian [name]                          ACCOUNT NO.:  [id number]   MEDICAL RECORD NO.:  [id number]                   PATIENT TYPE:  INP   LOCATION:  5707                                 FACILITY:  [facility]   PHYSICIAN:  Coralie Keens, M.D.              DATE OF BIRTH:  1961/04/09   DATE OF CONSULTATION:  08/12/2003  DATE OF DISCHARGE:                                   CONSULTATION   REASON FOR CONSULTATION:  Constant pancreatitis.   HISTORY:  The patient is a 50 year old gentleman whom I have operated on in  the past for a pancreatic pseudocyst that appeared de novo several years  ago.  He had had no previous history of gallstones or abdominal pain, and  this was found on CT scanning reevaluating a kidney stone.  He since  underwent exploratory laparotomy and draining of a pseudocyst, complicated  by a pancreatic fistula.  He has finally done well from that.  He has had  intermittent kidney stones, but recently developed abdominal pain, nausea  and vomiting.  He presented to the hospital where he was found to have  elevated amylase and lipase, and findings on CT scan are consistent with  pancreatitis. Ultrasound has since been performed showing the patient also  has gallstones.  Currently he reports his pain is improving somewhat.  He  has no chest pain, no shortness of breath.  He is passing air well and  moving his bowels.   PAST MEDICAL HISTORY:  Significant for diabetes, hypertension and kidney  stones, bleeding and duodenal ulcer.   ALLERGIES:  No known drug allergies.   MEDICATIONS:  On the chart as listed.   PREVIOUS SURGICAL HISTORY:  1. Exploratory laparotomy for pancreatic pseudocyst.  2. Reexploration for fistula.  3. He has also had procedures for kidney stones as well as endoscopy for a     bleeding ulcer.   SOCIAL HISTORY:  He does smoke but does not drink alcohol.   PHYSICAL EXAMINATION:  GENERAL:  An obese gentleman, in no acute distress.  VITAL  SIGNS:  He is afebrile.  His vital signs are stable.  HEENT:  Eyes, he is anicteric.  Pupils are reactive bilaterally.  Ears,  nose, mouth and throat:  His external ears and nose are normal.  Hearing is  normal.  Oropharynx is clear.  NECK:  Supple.  There is no cervical adenopathy or thyromegaly.  LUNGS:  Effort is normal.  Clear to auscultation bilaterally.  CARDIOVASCULAR:  Regular rate and rhythm but no murmurs.  ABDOMEN:  Obese.  There is a well-healed midline incision.  There are no  hernias.  There is tenderness with some guarding in the epigastrium.  The  abdomen is mildly distended.  There is no organomegaly.  EXTREMITIES:  Warm and well perfused.   LABORATORY DATA:  The patient has an amylase of 381 and lipase of 176, both  of  which are down.  Bilirubin is 1.1, alkaline phosphatase 63.  White blood  count is elevated at 22.0.  Hemoglobin 13.7.  Again, ultrasound was reviewed  and showed gallstones but no evidence of cholecystitis or gallbladder wall  thickening.  The common bile duct is 5 to 6 mm and mildly dilated.   ASSESSMENT AND PLAN:  This is a patient with gallstones pancreatitis.  Given  his bilirubin and alkaline phosphatase, I feel he does not have an  obstructing stone at this point.  It has probably passed.  The stone was in  his bile duct, causing the pancreatitis.  As soon as he resolves the  pancreatitis, cholecystectomy is recommended.  We would like to proceed with  that this admission.  I discussed the surgery with him including the risk of  bleeding, infection, other injury, etc.  We also discussed the risk of  needing to proceed with an open procedure given his recent upper abdominal  surgery.  We will plan for cholecystectomy as soon as the pancreatitis has  resolved.  We will follow him closely with you.                                               Coralie Keens, M.D.    DB/MEDQ  D:  08/12/2003  T:  08/13/2003  Job:  [id number]

## 2011-07-01 LAB — COMPREHENSIVE METABOLIC PANEL
BUN: 11
CO2: 24
Calcium: 8.6
Chloride: 103
Creatinine, Ser: 1
GFR calc Af Amer: >60
GFR calc non Af Amer: >60
Total Bilirubin: 2 — ABNORMAL HIGH

## 2011-07-01 LAB — CBC
HCT: 40.7
MCHC: 34.9
MCV: 93
RBC: 4.37
WBC: 15.9 — ABNORMAL HIGH

## 2014-03-09 ENCOUNTER — Encounter (HOSPITAL_BASED_OUTPATIENT_CLINIC_OR_DEPARTMENT_OTHER): Payer: Self-pay | Attending: General Surgery

## 2016-06-22 ENCOUNTER — Encounter (HOSPITAL_BASED_OUTPATIENT_CLINIC_OR_DEPARTMENT_OTHER): Payer: Self-pay | Admitting: *Deleted

## 2016-06-22 ENCOUNTER — Emergency Department (HOSPITAL_BASED_OUTPATIENT_CLINIC_OR_DEPARTMENT_OTHER): Payer: Worker's Compensation

## 2016-06-22 DIAGNOSIS — I10 Essential (primary) hypertension: Secondary | ICD-10-CM | POA: Diagnosis not present

## 2016-06-22 DIAGNOSIS — Z79899 Other long term (current) drug therapy: Secondary | ICD-10-CM | POA: Insufficient documentation

## 2016-06-22 DIAGNOSIS — E119 Type 2 diabetes mellitus without complications: Secondary | ICD-10-CM | POA: Diagnosis not present

## 2016-06-22 DIAGNOSIS — S42202A Unspecified fracture of upper end of left humerus, initial encounter for closed fracture: Secondary | ICD-10-CM | POA: Insufficient documentation

## 2016-06-22 DIAGNOSIS — S4992XA Unspecified injury of left shoulder and upper arm, initial encounter: Secondary | ICD-10-CM | POA: Diagnosis present

## 2016-06-22 DIAGNOSIS — W010XXA Fall on same level from slipping, tripping and stumbling without subsequent striking against object, initial encounter: Secondary | ICD-10-CM | POA: Diagnosis not present

## 2016-06-22 DIAGNOSIS — Z794 Long term (current) use of insulin: Secondary | ICD-10-CM | POA: Diagnosis not present

## 2016-06-22 DIAGNOSIS — Y990 Civilian activity done for income or pay: Secondary | ICD-10-CM | POA: Insufficient documentation

## 2016-06-22 DIAGNOSIS — Y939 Activity, unspecified: Secondary | ICD-10-CM | POA: Insufficient documentation

## 2016-06-22 DIAGNOSIS — Y929 Unspecified place or not applicable: Secondary | ICD-10-CM | POA: Insufficient documentation

## 2016-06-22 DIAGNOSIS — F17210 Nicotine dependence, cigarettes, uncomplicated: Secondary | ICD-10-CM | POA: Diagnosis not present

## 2016-06-22 NOTE — ED Triage Notes (Signed)
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  Reports left upper arm pain.  CMS intact.  Pulses intact.

## 2016-06-23 ENCOUNTER — Emergency Department (HOSPITAL_BASED_OUTPATIENT_CLINIC_OR_DEPARTMENT_OTHER)
Admission: EM | Admit: 2016-06-23 | Discharge: 2016-06-23 | Disposition: A | Discharge status: Home / Self Care | Payer: Worker's Compensation | Attending: Emergency Medicine | Admitting: Emergency Medicine

## 2016-06-23 DIAGNOSIS — S42202A Unspecified fracture of upper end of left humerus, initial encounter for closed fracture: Secondary | ICD-10-CM

## 2016-06-23 DIAGNOSIS — W010XXA Fall on same level from slipping, tripping and stumbling without subsequent striking against object, initial encounter: Secondary | ICD-10-CM

## 2016-06-23 HISTORY — DX: Type 2 diabetes mellitus without complications: E11.9

## 2016-06-23 HISTORY — DX: Unspecified osteoarthritis, unspecified site: M19.90

## 2016-06-23 HISTORY — DX: Essential (primary) hypertension: I10

## 2016-06-23 MED ORDER — HYDROCODONE-ACETAMINOPHEN 5-325 MG PO TABS: 1.0000 | ORAL_TABLET | Freq: Four times a day (QID) | ORAL | 0 refills | Status: DC | PRN

## 2016-06-23 NOTE — ED Provider Notes (Signed)
[name] DEPT [facility] Provider Note: Brian Spurling, MD, FACEP By signing my name below, I, Brian [name], attest that this documentation has been prepared under the direction and in the presence of No att. providers found . Electronically Signed: Dyke [name], Scribe. 06/23/2016. 12:37 AM.  CSN: [id number] MRN: [id number] ARRIVAL: 06/22/16 at [place]  No chief complaint on file.   HISTORY OF PRESENT ILLNESS  Brian [name] is a 55 y.o. male who presents to the Emergency Department complaining of left upper arm pain s/p fall earlier tonight. 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 His pain is moderate with movement but not significantly worsened with palpation. He was placed in a shoulder immobilizer in triage with improvement in his discomfort. He denies numbness or weakness in his left upper extremity. He denies other injury.  Past Medical History:  Diagnosis Date  . Arthritis   . Diabetes mellitus without complication ([place])   . Hypertension     Past Surgical History:  Procedure Laterality Date  . PANCREAS SURGERY    . SPLENECTOMY      History reviewed. No pertinent family history.  Social History  Substance Use Topics  . Smoking status: Current Every Day Smoker    Packs/day: 1.00    Types: Cigarettes  . Smokeless tobacco: Never Used  . Alcohol use No    Prior to Admission medications   Medication Sig Start Date End Date Taking? Authorizing Provider  amLODipine (NORVASC) 10 MG tablet Take 10 mg by mouth daily.   Yes Historical Provider, MD  atenolol (TENORMIN) 100 MG tablet Take 100 mg by mouth daily.   Yes Historical Provider, MD  furosemide (LASIX) 20 MG tablet Take 20 mg by mouth.   Yes Historical Provider, MD  insulin aspart protamine- aspart (NOVOLOG MIX 70/30) (70-30) 100 UNIT/ML injection Inject into the skin.   Yes Historical Provider, MD  HYDROcodone-acetaminophen (NORCO/VICODIN) 5-325 MG tablet Take 1-2 tablets by mouth every  6 (six) hours as needed (for pain). 06/23/16   Shanon Rosser, MD    Allergies Penicillins   REVIEW OF SYSTEMS  Negative except as noted here or in the History of Present Illness.  PHYSICAL EXAMINATION  Initial Vital Signs Blood pressure 175/93, pulse 71, temperature 98 F (36.7 C), temperature source Oral, resp. rate 18, height 5\' 8"  (1.727 m), weight 200 lb (90.7 kg), SpO2 100 %.  Examination General: Well-developed, well-nourished male in no acute distress; appearance consistent with age of record HENT: normocephalic; atraumatic Eyes: pupils equal, round and reactive to light; extraocular muscles intact Neck: supple; no C-spine tenderness Heart: regular rate and rhythm Lungs: clear to auscultation bilaterally Abdomen: soft; nondistended; nontender; no masses or hepatosplenomegaly; bowel sounds present Extremities: No deformity; full range of motion except left shoulder; left shoulder non tender but pain on attempted ROM; left upper extremity is distally neurovascularly intact with intact tendon function; trace edema of lower legs; pulses normal Neurologic: Awake, alert and oriented; motor function intact in all extremities and symmetric; no facial droop Skin: Warm and dry Psychiatric: Normal mood and affect  RESULTS  Summary of this visit's results, reviewed by myself:   EKG Interpretation  Date/Time:    Ventricular Rate:    PR Interval:    QRS Duration:   QT Interval:    QTC Calculation:   R Axis:     Text Interpretation:        Laboratory Studies: No results found for this or any  previous visit (from the past 24 hour(s)). Imaging Studies: Dg Humerus Left  Result Date: 06/22/2016 CLINICAL DATA:  [place] at work, LEFT upper arm pain, diabetes mellitus, hypertension EXAM: LEFT HUMERUS - 2+ VIEW COMPARISON:  None FINDINGS: Question mild osseous demineralization. Comminuted fracture at surgical neck of LEFT humerus with displacement of a greater tuberosity fragment as well.  Shoulder and elbow joint alignments grossly normal. AC joint alignment normal. No additional fracture or bone destruction seen. IMPRESSION: Comminuted proximal LEFT humeral fracture as above. Electronically Signed   By: Lavonia Dana M.D.   On: 06/22/2016 23:20    ED COURSE  Nursing notes and initial vitals signs, including pulse oximetry, reviewed.  Vitals:   06/22/16 2301 06/22/16 2302 06/23/16 0050  BP: 175/93  165/93  Pulse: 71  73  Resp: 18  19  Temp: 98 F (36.7 C)  97.8 F (36.6 C)  TempSrc: Oral    SpO2: 100%  100%  Weight:  200 lb (90.7 kg)   Height:  5\' 8"  (1.727 m)     PROCEDURES    ED DIAGNOSES     ICD-9-CM ICD-10-CM   1. Fall from slip, trip, or stumble, initial encounter E885.9 W18.39XA   2. Fracture of proximal end of left humerus, closed, initial encounter 812.00 S42.202A      I personally performed the services described in this documentation, which was scribed in my presence. The recorded information has been reviewed and is accurate.    Shanon Rosser, MD 06/23/16 [phone]

## 2017-02-04 ENCOUNTER — Encounter (HOSPITAL_COMMUNITY): Payer: Self-pay | Admitting: Emergency Medicine

## 2017-02-04 DIAGNOSIS — Z880 Allergy status to penicillin: Secondary | ICD-10-CM

## 2017-02-04 DIAGNOSIS — D72829 Elevated white blood cell count, unspecified: Secondary | ICD-10-CM | POA: Diagnosis present

## 2017-02-04 DIAGNOSIS — E1122 Type 2 diabetes mellitus with diabetic chronic kidney disease: Secondary | ICD-10-CM | POA: Diagnosis present

## 2017-02-04 DIAGNOSIS — N179 Acute kidney failure, unspecified: Principal | ICD-10-CM | POA: Diagnosis present

## 2017-02-04 DIAGNOSIS — Z794 Long term (current) use of insulin: Secondary | ICD-10-CM

## 2017-02-04 DIAGNOSIS — F17210 Nicotine dependence, cigarettes, uncomplicated: Secondary | ICD-10-CM | POA: Diagnosis present

## 2017-02-04 DIAGNOSIS — Z791 Long term (current) use of non-steroidal anti-inflammatories (NSAID): Secondary | ICD-10-CM

## 2017-02-04 DIAGNOSIS — T39395A Adverse effect of other nonsteroidal anti-inflammatory drugs [NSAID], initial encounter: Secondary | ICD-10-CM | POA: Diagnosis present

## 2017-02-04 DIAGNOSIS — M1990 Unspecified osteoarthritis, unspecified site: Secondary | ICD-10-CM | POA: Diagnosis present

## 2017-02-04 DIAGNOSIS — E785 Hyperlipidemia, unspecified: Secondary | ICD-10-CM | POA: Diagnosis present

## 2017-02-04 DIAGNOSIS — E8770 Fluid overload, unspecified: Secondary | ICD-10-CM | POA: Diagnosis present

## 2017-02-04 DIAGNOSIS — J810 Acute pulmonary edema: Secondary | ICD-10-CM | POA: Diagnosis present

## 2017-02-04 DIAGNOSIS — Z23 Encounter for immunization: Secondary | ICD-10-CM

## 2017-02-04 DIAGNOSIS — Z79899 Other long term (current) drug therapy: Secondary | ICD-10-CM

## 2017-02-04 DIAGNOSIS — I129 Hypertensive chronic kidney disease with stage 1 through stage 4 chronic kidney disease, or unspecified chronic kidney disease: Secondary | ICD-10-CM | POA: Diagnosis present

## 2017-02-04 DIAGNOSIS — I5189 Other ill-defined heart diseases: Secondary | ICD-10-CM | POA: Diagnosis present

## 2017-02-04 DIAGNOSIS — N183 Chronic kidney disease, stage 3 (moderate): Secondary | ICD-10-CM | POA: Diagnosis present

## 2017-02-04 DIAGNOSIS — Z9081 Acquired absence of spleen: Secondary | ICD-10-CM

## 2017-02-04 LAB — CBC WITH DIFFERENTIAL/PLATELET
BASOS PCT: 1 %
Basophils Absolute: 0.1 10*3/uL (ref 0.0–0.1)
EOS ABS: 0.5 10*3/uL (ref 0.0–0.7)
EOS PCT: 3 %
HCT: 35.5 % — ABNORMAL LOW (ref 39.0–52.0)
Hemoglobin: 11.8 g/dL — ABNORMAL LOW (ref 13.0–17.0)
Lymphocytes Relative: 39 %
Lymphs Abs: 5.8 10*3/uL — ABNORMAL HIGH (ref 0.7–4.0)
MCH: 32.2 pg (ref 26.0–34.0)
MCHC: 33.2 g/dL (ref 30.0–36.0)
MCV: 97 fL (ref 78.0–100.0)
MONO ABS: 2.1 10*3/uL — AB (ref 0.1–1.0)
MONOS PCT: 14 %
NEUTROS PCT: 43 %
Neutro Abs: 6.3 10*3/uL (ref 1.7–7.7)
Platelets: 262 10*3/uL (ref 150–400)
RBC: 3.66 MIL/uL — ABNORMAL LOW (ref 4.22–5.81)
RDW: 13.3 % (ref 11.5–15.5)
WBC: 14.8 10*3/uL — ABNORMAL HIGH (ref 4.0–10.5)

## 2017-02-04 LAB — URINALYSIS, ROUTINE W REFLEX MICROSCOPIC
BACTERIA UA: NONE SEEN
BILIRUBIN URINE: NEGATIVE
GLUCOSE, UA: 150 mg/dL — AB
Ketones, ur: NEGATIVE mg/dL
LEUKOCYTES UA: NEGATIVE
NITRITE: NEGATIVE
PH: 6 (ref 5.0–8.0)
Protein, ur: 100 mg/dL — AB
Specific Gravity, Urine: 1.013 (ref 1.005–1.030)
Squamous Epithelial / LPF: NONE SEEN

## 2017-02-04 NOTE — ED Triage Notes (Signed)
Pt. reports bilateral testicular swelling onset last week , denies injury , no dysuria or hematuria .

## 2017-02-05 ENCOUNTER — Emergency Department (HOSPITAL_COMMUNITY): Payer: No Typology Code available for payment source

## 2017-02-05 ENCOUNTER — Encounter (HOSPITAL_COMMUNITY): Payer: Self-pay | Admitting: Internal Medicine

## 2017-02-05 ENCOUNTER — Inpatient Hospital Stay (HOSPITAL_COMMUNITY)
Admission: EM | Admit: 2017-02-05 | Discharge: 2017-02-07 | DRG: 682 | Disposition: A | Discharge status: Home / Self Care | Payer: No Typology Code available for payment source | Attending: Internal Medicine | Admitting: Internal Medicine

## 2017-02-05 DIAGNOSIS — E785 Hyperlipidemia, unspecified: Secondary | ICD-10-CM | POA: Diagnosis present

## 2017-02-05 DIAGNOSIS — Z79899 Other long term (current) drug therapy: Secondary | ICD-10-CM | POA: Diagnosis not present

## 2017-02-05 DIAGNOSIS — I361 Nonrheumatic tricuspid (valve) insufficiency: Secondary | ICD-10-CM | POA: Diagnosis not present

## 2017-02-05 DIAGNOSIS — Z720 Tobacco use: Secondary | ICD-10-CM | POA: Diagnosis present

## 2017-02-05 DIAGNOSIS — I129 Hypertensive chronic kidney disease with stage 1 through stage 4 chronic kidney disease, or unspecified chronic kidney disease: Secondary | ICD-10-CM | POA: Diagnosis present

## 2017-02-05 DIAGNOSIS — E1122 Type 2 diabetes mellitus with diabetic chronic kidney disease: Secondary | ICD-10-CM | POA: Diagnosis present

## 2017-02-05 DIAGNOSIS — Z791 Long term (current) use of non-steroidal anti-inflammatories (NSAID): Secondary | ICD-10-CM | POA: Diagnosis not present

## 2017-02-05 DIAGNOSIS — N179 Acute kidney failure, unspecified: Principal | ICD-10-CM

## 2017-02-05 DIAGNOSIS — I10 Essential (primary) hypertension: Secondary | ICD-10-CM | POA: Diagnosis present

## 2017-02-05 DIAGNOSIS — Z794 Long term (current) use of insulin: Secondary | ICD-10-CM | POA: Diagnosis not present

## 2017-02-05 DIAGNOSIS — M1990 Unspecified osteoarthritis, unspecified site: Secondary | ICD-10-CM | POA: Diagnosis present

## 2017-02-05 DIAGNOSIS — D72829 Elevated white blood cell count, unspecified: Secondary | ICD-10-CM | POA: Diagnosis present

## 2017-02-05 DIAGNOSIS — Z9081 Acquired absence of spleen: Secondary | ICD-10-CM | POA: Diagnosis not present

## 2017-02-05 DIAGNOSIS — Z23 Encounter for immunization: Secondary | ICD-10-CM | POA: Diagnosis not present

## 2017-02-05 DIAGNOSIS — E8770 Fluid overload, unspecified: Secondary | ICD-10-CM | POA: Diagnosis present

## 2017-02-05 DIAGNOSIS — R601 Generalized edema: Secondary | ICD-10-CM | POA: Diagnosis present

## 2017-02-05 DIAGNOSIS — N186 End stage renal disease: Secondary | ICD-10-CM | POA: Diagnosis present

## 2017-02-05 DIAGNOSIS — J810 Acute pulmonary edema: Secondary | ICD-10-CM | POA: Diagnosis present

## 2017-02-05 DIAGNOSIS — F17210 Nicotine dependence, cigarettes, uncomplicated: Secondary | ICD-10-CM | POA: Diagnosis present

## 2017-02-05 DIAGNOSIS — T39395A Adverse effect of other nonsteroidal anti-inflammatory drugs [NSAID], initial encounter: Secondary | ICD-10-CM | POA: Diagnosis present

## 2017-02-05 DIAGNOSIS — N183 Chronic kidney disease, stage 3 (moderate): Secondary | ICD-10-CM | POA: Diagnosis present

## 2017-02-05 DIAGNOSIS — E119 Type 2 diabetes mellitus without complications: Secondary | ICD-10-CM | POA: Diagnosis not present

## 2017-02-05 DIAGNOSIS — I5189 Other ill-defined heart diseases: Secondary | ICD-10-CM | POA: Diagnosis present

## 2017-02-05 DIAGNOSIS — Z880 Allergy status to penicillin: Secondary | ICD-10-CM | POA: Diagnosis not present

## 2017-02-05 DIAGNOSIS — Z992 Dependence on renal dialysis: Secondary | ICD-10-CM

## 2017-02-05 HISTORY — DX: Tobacco use: Z72.0

## 2017-02-05 LAB — COMPREHENSIVE METABOLIC PANEL
ALT: 23 U/L (ref 17–63)
ANION GAP: 9 (ref 5–15)
AST: 35 U/L (ref 15–41)
Albumin: 3.2 g/dL — ABNORMAL LOW (ref 3.5–5.0)
Alkaline Phosphatase: 142 U/L — ABNORMAL HIGH (ref 38–126)
BUN: 54 mg/dL — ABNORMAL HIGH (ref 6–20)
CHLORIDE: 109 mmol/L (ref 101–111)
CO2: 17 mmol/L — AB (ref 22–32)
CREATININE: 4.53 mg/dL — AB (ref 0.61–1.24)
Calcium: 8.1 mg/dL — ABNORMAL LOW (ref 8.9–10.3)
GFR calc non Af Amer: 13 mL/min — ABNORMAL LOW (ref >60)
GFR, EST AFRICAN AMERICAN: 16 mL/min — AB (ref >60)
Glucose, Bld: 154 mg/dL — ABNORMAL HIGH (ref 65–99)
POTASSIUM: 4.6 mmol/L (ref 3.5–5.1)
SODIUM: 135 mmol/L (ref 135–145)
Total Bilirubin: 0.4 mg/dL (ref 0.3–1.2)
Total Protein: 6.4 g/dL — ABNORMAL LOW (ref 6.5–8.1)

## 2017-02-05 LAB — BASIC METABOLIC PANEL
Anion gap: 10 (ref 5–15)
BUN: 54 mg/dL — AB (ref 6–20)
CALCIUM: 8.1 mg/dL — AB (ref 8.9–10.3)
CHLORIDE: 112 mmol/L — AB (ref 101–111)
CO2: 16 mmol/L — ABNORMAL LOW (ref 22–32)
CREATININE: 4.47 mg/dL — AB (ref 0.61–1.24)
GFR, EST AFRICAN AMERICAN: 16 mL/min — AB (ref >60)
GFR, EST NON AFRICAN AMERICAN: 14 mL/min — AB (ref >60)
Glucose, Bld: 78 mg/dL (ref 65–99)
Potassium: 4.3 mmol/L (ref 3.5–5.1)
SODIUM: 138 mmol/L (ref 135–145)

## 2017-02-05 LAB — CBC
HCT: 34.7 % — ABNORMAL LOW (ref 39.0–52.0)
Hemoglobin: 11.2 g/dL — ABNORMAL LOW (ref 13.0–17.0)
MCH: 31.4 pg (ref 26.0–34.0)
MCHC: 32.3 g/dL (ref 30.0–36.0)
MCV: 97.2 fL (ref 78.0–100.0)
PLATELETS: 241 10*3/uL (ref 150–400)
RBC: 3.57 MIL/uL — ABNORMAL LOW (ref 4.22–5.81)
RDW: 13.4 % (ref 11.5–15.5)
WBC: 14.7 10*3/uL — AB (ref 4.0–10.5)

## 2017-02-05 LAB — LIPID PANEL
Cholesterol: 176 mg/dL (ref 0–200)
HDL: 38 mg/dL — ABNORMAL LOW (ref >40)
LDL Cholesterol: 99 mg/dL (ref 0–99)
Total CHOL/HDL Ratio: 4.6 RATIO
Triglycerides: 197 mg/dL — ABNORMAL HIGH (ref <150)
VLDL: 39 mg/dL (ref 0–40)

## 2017-02-05 LAB — GLUCOSE, CAPILLARY
GLUCOSE-CAPILLARY: 87 mg/dL (ref 65–99)
GLUCOSE-CAPILLARY: 82 mg/dL (ref 65–99)
GLUCOSE-CAPILLARY: 92 mg/dL (ref 65–99)
Glucose-Capillary: 81 mg/dL (ref 65–99)

## 2017-02-05 LAB — RAPID URINE DRUG SCREEN, HOSP PERFORMED
Amphetamines: NOT DETECTED
Barbiturates: NOT DETECTED
Benzodiazepines: NOT DETECTED
Cocaine: NOT DETECTED
Opiates: NOT DETECTED
Tetrahydrocannabinol: NOT DETECTED

## 2017-02-05 LAB — BRAIN NATRIURETIC PEPTIDE: B NATRIURETIC PEPTIDE 5: 800.3 pg/mL — AB (ref 0.0–100.0)

## 2017-02-05 LAB — PROTEIN / CREATININE RATIO, URINE
CREATININE, URINE: 21.4 mg/dL
Protein Creatinine Ratio: 6.17 mg/mg{Cre} — ABNORMAL HIGH (ref 0.00–0.15)
TOTAL PROTEIN, URINE: 132 mg/dL

## 2017-02-05 LAB — HIV ANTIBODY (ROUTINE TESTING W REFLEX): HIV SCREEN 4TH GENERATION: NONREACTIVE

## 2017-02-05 LAB — TROPONIN I: TROPONIN I: 0.03 ng/mL — AB (ref <0.03)

## 2017-02-05 LAB — CREATININE, URINE, RANDOM: CREATININE, URINE: 52.09 mg/dL

## 2017-02-05 MED ORDER — ACETAMINOPHEN 650 MG RE SUPP: 650.0000 mg | Freq: Four times a day (QID) | RECTAL | Status: DC | PRN

## 2017-02-05 MED ORDER — ZOLPIDEM TARTRATE 5 MG PO TABS: 5.0000 mg | ORAL_TABLET | Freq: Every evening | ORAL | Status: DC | PRN

## 2017-02-05 MED ORDER — AMLODIPINE BESYLATE 10 MG PO TABS
10.0000 mg | ORAL_TABLET | Freq: Every day | ORAL | Status: DC
  Administered 2017-02-05 – 2017-02-07 (×3): 10 mg via ORAL
  Filled 2017-02-05 (×3): qty 1

## 2017-02-05 MED ORDER — HEPARIN SODIUM (PORCINE) 5000 UNIT/ML IJ SOLN
5000.0000 [IU] | Freq: Three times a day (TID) | INTRAMUSCULAR | Status: DC
Start: 2017-02-05 — End: 2017-02-07
  Administered 2017-02-05 – 2017-02-07 (×7): 5000 [IU] via SUBCUTANEOUS
  Filled 2017-02-05 (×5): qty 1

## 2017-02-05 MED ORDER — INSULIN ASPART PROT & ASPART (70-30 MIX) 100 UNIT/ML ~~LOC~~ SUSP
5.0000 [IU] | Freq: Every day | SUBCUTANEOUS | Status: DC
Start: 2017-02-05 — End: 2017-02-07
  Administered 2017-02-06: 5 [IU] via SUBCUTANEOUS
  Filled 2017-02-05: qty 10

## 2017-02-05 MED ORDER — PNEUMOCOCCAL VAC POLYVALENT 25 MCG/0.5ML IJ INJ
0.5000 mL | INJECTION | INTRAMUSCULAR | Status: AC
  Administered 2017-02-06: 0.5 mL via INTRAMUSCULAR
  Filled 2017-02-05: qty 0.5

## 2017-02-05 MED ORDER — SODIUM CHLORIDE 0.9% FLUSH
3.0000 mL | Freq: Two times a day (BID) | INTRAVENOUS | Status: DC
  Administered 2017-02-05 – 2017-02-07 (×6): 3 mL via INTRAVENOUS

## 2017-02-05 MED ORDER — HYDRALAZINE HCL 20 MG/ML IJ SOLN: 5.0000 mg | INTRAMUSCULAR | Status: DC | PRN

## 2017-02-05 MED ORDER — INSULIN ASPART 100 UNIT/ML ~~LOC~~ SOLN
0.0000 [IU] | Freq: Three times a day (TID) | SUBCUTANEOUS | Status: DC
  Administered 2017-02-06 (×2): 1 [IU] via SUBCUTANEOUS

## 2017-02-05 MED ORDER — SODIUM BICARBONATE 8.4 % IV SOLN
50.0000 meq | Freq: Once | INTRAVENOUS | Status: AC
  Administered 2017-02-05: 50 meq via INTRAVENOUS
  Filled 2017-02-05: qty 50

## 2017-02-05 MED ORDER — FUROSEMIDE 10 MG/ML IJ SOLN
40.0000 mg | Freq: Once | INTRAMUSCULAR | Status: AC
  Administered 2017-02-05: 40 mg via INTRAVENOUS
  Filled 2017-02-05: qty 4

## 2017-02-05 MED ORDER — ACETAMINOPHEN 325 MG PO TABS: 650.0000 mg | ORAL_TABLET | Freq: Four times a day (QID) | ORAL | Status: DC | PRN

## 2017-02-05 MED ORDER — INSULIN ASPART 100 UNIT/ML ~~LOC~~ SOLN
0.0000 [IU] | Freq: Every day | SUBCUTANEOUS | Status: DC
  Administered 2017-02-06: 0 [IU] via SUBCUTANEOUS

## 2017-02-05 MED ORDER — HYDROCODONE-ACETAMINOPHEN 5-325 MG PO TABS
1.0000 | ORAL_TABLET | Freq: Four times a day (QID) | ORAL | Status: DC | PRN
  Filled 2017-02-05: qty 2

## 2017-02-05 MED ORDER — NICOTINE 21 MG/24HR TD PT24
21.0000 mg | MEDICATED_PATCH | Freq: Every day | TRANSDERMAL | Status: DC
  Filled 2017-02-05 (×2): qty 1

## 2017-02-05 MED ORDER — ATENOLOL 50 MG PO TABS
100.0000 mg | ORAL_TABLET | Freq: Every day | ORAL | Status: DC
  Administered 2017-02-05 – 2017-02-07 (×3): 100 mg via ORAL
  Filled 2017-02-05 (×4): qty 2

## 2017-02-05 MED ORDER — ONDANSETRON HCL 4 MG PO TABS: 4.0000 mg | ORAL_TABLET | Freq: Four times a day (QID) | ORAL | Status: DC | PRN

## 2017-02-05 MED ORDER — ENOXAPARIN SODIUM 40 MG/0.4ML ~~LOC~~ SOLN: 40.0000 mg | SUBCUTANEOUS | Status: DC

## 2017-02-05 MED ORDER — ONDANSETRON HCL 4 MG/2ML IJ SOLN: 4.0000 mg | Freq: Four times a day (QID) | INTRAMUSCULAR | Status: DC | PRN

## 2017-02-05 MED ORDER — FUROSEMIDE 10 MG/ML IJ SOLN
40.0000 mg | Freq: Two times a day (BID) | INTRAMUSCULAR | Status: DC
  Administered 2017-02-05 – 2017-02-07 (×5): 40 mg via INTRAVENOUS
  Filled 2017-02-05 (×4): qty 4

## 2017-02-05 NOTE — ED Notes (Signed)
Bladder scan performed.  39ml noted on scanner.  Voided to urinal.  300cc's out.  Re scanned.  Bladder now empty.

## 2017-02-05 NOTE — Progress Notes (Signed)
MD notified of trop 0.03. Critical lab received from lab.

## 2017-02-05 NOTE — H&P (Signed)
History and Physical    Brian [name] JME:[id number] DOB: Nov 08, 1960 DOA: 02/05/2017  Referring MD/NP/PA:   PCP: [name], MD   Patient coming from:  The patient is coming from home.  At baseline, pt is independent for most of ADL.   Chief Complaint: Testicular swelling and leg swelling  HPI: Brian [name] is a 56 y.o. male with medical history significant of diabetes mellitus, hyperlipidemia, tobacco abuse, CKD-III, arthritis, splenectomy, who presents with testicular swelling and leg swelling.  Patient states that he noticed testicular swelling one week ago, which has been progressively getting worse. He also has bilateral leg swelling, which has also been progressively getting worse. No testicular injury or testicular pain. Patient denies chest pain, SOB, cough, symptoms of UTI or GI symptoms. No unilateral weakness.   ED Course: pt was found to have worsening renal function with creatinine up from 1.40 on 09/13/10 to 4.53, bicarbonate 17, BUN 54, negative troponin, BNP 800.3, negative urinalysis, WBC 14.8, temperature normal, no tachycardia, oxygen saturation 95% on room air. Chest x-ray showed mild pulmonary edema. US-renal showed no hydronephrosis. Pt is admitted to tele bed as inpt.  Review of Systems:   General: no fevers, chills, no changes in body weight, has fatigue HEENT: no blurry vision, hearing changes or sore throat Respiratory: no dyspnea, coughing, wheezing CV: no chest pain, no palpitations GI: no nausea, vomiting, abdominal pain, diarrhea, constipation GU: no dysuria, burning on urination, increased urinary frequency, hematuria  Ext: has leg edema Neuro: no unilateral weakness, numbness, or tingling, no vision change or hearing loss Skin: no rash, no skin tear. MSK: No muscle spasm, no deformity, no limitation of range of movement in spin Heme: No easy bruising.  Travel history: No recent long distant travel.  Allergy:  Allergies  Allergen Reactions    . Penicillins Hives and Rash    Past Medical History:  Diagnosis Date  . Arthritis   . Diabetes mellitus without complication ([place])   . Hypertension   . Tobacco abuse     Past Surgical History:  Procedure Laterality Date  . PANCREAS SURGERY    . SPLENECTOMY      Social History:  reports that he has been smoking Cigarettes.  He has been smoking about 1.00 pack per day. He has never used smokeless tobacco. He reports that he does not drink alcohol or use drugs.  Family History:  Family History  Problem Relation Age of Onset  . Lymphoma Mother   . Hypertension Father   . Diabetes Mellitus II Father   . Hypertension Brother   . Diabetes Mellitus I Brother      Prior to Admission medications   Medication Sig Start Date End Date Taking? Authorizing Provider  amLODipine (NORVASC) 10 MG tablet Take 10 mg by mouth daily.    Historical Provider, MD  atenolol (TENORMIN) 100 MG tablet Take 100 mg by mouth daily.    Historical Provider, MD  furosemide (LASIX) 20 MG tablet Take 20 mg by mouth.    Historical Provider, MD  HYDROcodone-acetaminophen (NORCO/VICODIN) 5-325 MG tablet Take 1-2 tablets by mouth every 6 (six) hours as needed (for pain). 06/23/16   John Molpus, MD  insulin aspart protamine- aspart (NOVOLOG MIX 70/30) (70-30) 100 UNIT/ML injection Inject into the skin.    Historical Provider, MD    Physical Exam: Vitals:   02/05/17 0245 02/05/17 0300 02/05/17 0320 02/05/17 0330  BP: (!) 143/79 (!) 153/82 (!) 162/95 (!) 158/100  Pulse: 63 63 64 64  Resp:   16   Temp:   98 F (36.7 C)   TempSrc:   Oral   SpO2: 98% 99% 99% 97%   General: Not in acute distress. Has anasarca HEENT:       Eyes: PERRL, EOMI, no scleral icterus.       ENT: No discharge from the ears and nose, no pharynx injection, no tonsillar enlargement.        Neck: No JVD, no bruit, no mass felt. Heme: No neck lymph node enlargement. Cardiac: S1/S2, RRR, No murmurs, No gallops or rubs. Respiratory: No  rales, wheezing, rhonchi or rubs. GI: Soft, nondistended, nontender, no rebound pain, no organomegaly, BS present. GU: No hematuria Ext: 3+ pitting leg edema bilaterally. 2+DP/PT pulse bilaterally. Musculoskeletal: No joint deformities, No joint redness or warmth, no limitation of ROM in spin. Skin: No rashes.  Neuro: Alert, oriented X3, cranial nerves II-XII grossly intact, moves all extremities normally.  Psych: Patient is not psychotic, no suicidal or hemocidal ideation.  Labs on Admission: I have personally reviewed following labs and imaging studies  CBC:  Recent Labs Lab 02/04/17 2310 02/05/17 0335  WBC 14.8* 14.7*  NEUTROABS 6.3  --   HGB 11.8* 11.2*  HCT 35.5* 34.7*  MCV 97.0 97.2  PLT 262 277   Basic Metabolic Panel:  Recent Labs Lab 02/04/17 2310  NA 135  K 4.6  CL 109  CO2 17*  GLUCOSE 154*  BUN 54*  CREATININE 4.53*  CALCIUM 8.1*   GFR: CrCl cannot be calculated (Unknown ideal weight.). Liver Function Tests:  Recent Labs Lab 02/04/17 2310  AST 35  ALT 23  ALKPHOS 142*  BILITOT 0.4  PROT 6.4*  ALBUMIN 3.2*   No results for input(s): LIPASE, AMYLASE in the last 168 hours. No results for input(s): AMMONIA in the last 168 hours. Coagulation Profile: No results for input(s): INR, PROTIME in the last 168 hours. Cardiac Enzymes:  Recent Labs Lab 02/04/17 2310  TROPONINI <0.03   BNP (last 3 results) No results for input(s): PROBNP in the last 8760 hours. HbA1C: No results for input(s): HGBA1C in the last 72 hours. CBG: No results for input(s): GLUCAP in the last 168 hours. Lipid Profile: No results for input(s): CHOL, HDL, LDLCALC, TRIG, CHOLHDL, LDLDIRECT in the last 72 hours. Thyroid Function Tests: No results for input(s): TSH, T4TOTAL, FREET4, T3FREE, THYROIDAB in the last 72 hours. Anemia Panel: No results for input(s): VITAMINB12, FOLATE, FERRITIN, TIBC, IRON, RETICCTPCT in the last 72 hours. Urine analysis:    Component Value  Date/Time   COLORURINE STRAW (A) 02/04/2017 2308   APPEARANCEUR CLEAR 02/04/2017 2308   LABSPEC 1.013 02/04/2017 2308   PHURINE 6.0 02/04/2017 2308   GLUCOSEU 150 (A) 02/04/2017 2308   HGBUR SMALL (A) 02/04/2017 2308   BILIRUBINUR NEGATIVE 02/04/2017 2308   KETONESUR NEGATIVE 02/04/2017 2308   PROTEINUR 100 (A) 02/04/2017 2308   UROBILINOGEN 1.0 11/05/2008 1503   NITRITE NEGATIVE 02/04/2017 2308   LEUKOCYTESUR NEGATIVE 02/04/2017 2308   Sepsis Labs: @LABRCNTIP (procalcitonin:4,lacticidven:4) )No results found for this or any previous visit (from the past 240 hour(s)).   Radiological Exams on Admission: Dg Chest 2 View  Result Date: 02/05/2017 CLINICAL DATA:  Lower extremity swelling for 2 weeks. EXAM: CHEST  2 VIEW COMPARISON:  11/05/2008 FINDINGS: Mild cardiomegaly. Mild vascular and interstitial prominence, new. No airspace consolidation. Small right pleural effusion. IMPRESSION: Vascular and interstitial prominence. Small right pleural effusion. Cardiomegaly. The findings likely represent mild congestive heart failure. Electronically Signed  By: Andreas Newport M.D.   On: 02/05/2017 01:45   US Renal  Result Date: 02/05/2017 CLINICAL DATA:  Renal failure. EXAM: RENAL / URINARY TRACT ULTRASOUND COMPLETE COMPARISON:  None. FINDINGS: Right Kidney: Length: 10.8 cm. Echogenicity within normal limits. No mass or hydronephrosis visualized. Left Kidney: Length: 10.4 cm. Echogenicity within normal limits. No mass or hydronephrosis visualized. Bladder: Appears normal for degree of bladder distention. IMPRESSION: No hydronephrosis. Previously observed right renal calculus is not well seen today. Electronically Signed   By: Andreas Newport M.D.   On: 02/05/2017 03:33     EKG: Independently reviewed. Sinus rhythm, QTC 450, LAD, nonspecific T-wave change.   Assessment/Plan Principal Problem:   Acute renal failure superimposed on stage 3 chronic kidney disease (HCC) Active Problems:    Hypertension   Diabetes mellitus without complication (HCC)   Anasarca   Tobacco abuse   Leukocytosis   Acute renal failure superimposed on stage 3 chronic kidney disease ([place]): creatinine up from 1.40 on 09/13/10 to 4.53, bicarbonate 17, BUN 54. Not sure if this is acutely worsening renal fx or due to chronic progression. No hydronephrosis on renal ultrasound.   -will admit to tele bed as inpt - Check FeUrea - Follow up renal function by BMP - Hold Ibuprofen - given 50 mEq of bicarbonate - please call renal in AM for consultation.  Anasarca: Likely due to worsening renal function. Patient's BNP is elevated at 800.3, which is likely due to decreased clearance secondary to worsening renal function, but cannot completely rule out CHF. -will start lasix 40 mg bid by IV -trop x 3 -get 2d echo  Hypertension: -continue amlodipine, atenolol  IV hydralazine when necessary-  DM-II: Last A1c  12.5 on 09/13/10, poorly fairly well controled. Patient is taking 70/30 insulin at home, 10 to 20 units at [facility] -will decrease70/30 inuslin dose to 5 units daily -SSI -Check A1c  Tobacco abuse: -Did counseling about importance of quitting smoking -Nicotine patch  Leukocytosis: no signs of infection. UA negative. No fever. No respiratory symptoms. Likely due to stress induced to demargination. Pt is s/p of splenectomy, will have low threshold to start antibiotics if patient develops any fever -follow up by CBC  DVT ppx: SQ Heparin Code Status: Full code Family Communication: None at bed side.  Disposition Plan:  Anticipate discharge back to previous home environment Consults called:  noine Admission status:   Inpatient/tele    Date of Service 02/05/2017    Ivor Costa [facility] Pager [phone]  If 7PM-7AM, please contact night-coverage [email or web address] Password [email or web address] 02/05/2017, 4:20 AM

## 2017-02-05 NOTE — ED Provider Notes (Signed)
[facility] DEPT Provider Note   CSN: [id number] Arrival date & time: 02/04/17  2251  By signing my name below, I, Margit Banda, attest that this documentation has been prepared under the direction and in the presence of Orpah Greek, MD. Electronically Signed: Margit Banda, ED Scribe. 02/05/17. 12:59 AM.  History   Chief Complaint Chief Complaint  Patient presents with  . Testicular Swelling    HPI Brian [name] is a 56 y.o. male who presents to the Emergency Department complaining of bilateral testicular swelling that started the last few days. Pt reports he was out of work for ~ 6 months and was healthy when he went he started working the beginning of March. A few weeks ago, he noticed his bilateral legs and knees would swell during the day. The swelling goes down during the night. No recent injuries. He notes his several years ago his liver was shutting down due to ETOH, however, he hasn't drank in ~ 10 years. Pt denies SOB, testicular pain, dysuria, hematuria or any other sx at this time.   The history is provided by the patient. No language interpreter was used.    Past Medical History:  Diagnosis Date  . Arthritis   . Diabetes mellitus without complication ([place])   . Hypertension     There are no active problems to display for this patient.   Past Surgical History:  Procedure Laterality Date  . PANCREAS SURGERY    . SPLENECTOMY         Home Medications    Prior to Admission medications   Medication Sig Start Date End Date Taking? Authorizing Provider  amLODipine (NORVASC) 10 MG tablet Take 10 mg by mouth daily.    Historical Provider, MD  atenolol (TENORMIN) 100 MG tablet Take 100 mg by mouth daily.    Historical Provider, MD  furosemide (LASIX) 20 MG tablet Take 20 mg by mouth.    Historical Provider, MD  HYDROcodone-acetaminophen (NORCO/VICODIN) 5-325 MG tablet Take 1-2 tablets by mouth every 6 (six) hours as needed (for pain). 06/23/16   John  Molpus, MD  insulin aspart protamine- aspart (NOVOLOG MIX 70/30) (70-30) 100 UNIT/ML injection Inject into the skin.    Historical Provider, MD    Family History No family history on file.  Social History Social History  Substance Use Topics  . Smoking status: Current Every Day Smoker    Packs/day: 1.00    Types: Cigarettes  . Smokeless tobacco: Never Used  . Alcohol use No     Allergies   Penicillins   Review of Systems Review of Systems  Respiratory: Negative for shortness of breath.   Genitourinary: Positive for dysuria and scrotal swelling. Negative for hematuria and testicular pain.  All other systems reviewed and are negative.    Physical Exam Updated Vital Signs BP (!) 144/88   Pulse 63   Resp 15   SpO2 95%   Physical Exam  Constitutional: He is oriented to person, place, and time. He appears well-developed and well-nourished. No distress.  HENT:  Head: Normocephalic and atraumatic.  Right Ear: Hearing normal.  Left Ear: Hearing normal.  Nose: Nose normal.  Mouth/Throat: Oropharynx is clear and moist and mucous membranes are normal.  Eyes: Conjunctivae and EOM are normal. Pupils are equal, round, and reactive to light.  Neck: Normal range of motion. Neck supple.  Cardiovascular: Regular rhythm, S1 normal and S2 normal.  Exam reveals gallop. Exam reveals no friction rub.   No murmur heard. Intermittent  S4 gallop.  Pulmonary/Chest: Effort normal and breath sounds normal. No respiratory distress. He exhibits no tenderness.  Abdominal: Soft. Normal appearance and bowel sounds are normal. There is no hepatosplenomegaly. There is no tenderness. There is no rebound, no guarding, no tenderness at McBurney's point and negative Murphy's sign. No hernia.  Genitourinary:  Genitourinary Comments: Edema of the scrotum.  Musculoskeletal: Normal range of motion.  Pitting edema to bilateral LE up to his abdomen.   Neurological: He is alert and oriented to person, place,  and time. He has normal strength. No cranial nerve deficit or sensory deficit. Coordination normal. GCS eye subscore is 4. GCS verbal subscore is 5. GCS motor subscore is 6.  Skin: Skin is warm, dry and intact. No rash noted. No cyanosis.  Psychiatric: He has a normal mood and affect. His speech is normal and behavior is normal. Thought content normal.  Nursing note and vitals reviewed.    ED Treatments / Results  DIAGNOSTIC STUDIES: Oxygen Saturation is 98% on RA, normal by my interpretation.   COORDINATION OF CARE: 12:55 AM-Discussed next steps with pt. Pt verbalized understanding and is agreeable with the plan.    Labs (all labs ordered are listed, but only abnormal results are displayed) Labs Reviewed  URINALYSIS, ROUTINE W REFLEX MICROSCOPIC - Abnormal; Notable for the following:       Result Value   Color, Urine STRAW (*)    Glucose, UA 150 (*)    Hgb urine dipstick SMALL (*)    Protein, ur 100 (*)    All other components within normal limits  CBC WITH DIFFERENTIAL/PLATELET - Abnormal; Notable for the following:    WBC 14.8 (*)    RBC 3.66 (*)    Hemoglobin 11.8 (*)    HCT 35.5 (*)    Lymphs Abs 5.8 (*)    Monocytes Absolute 2.1 (*)    All other components within normal limits  COMPREHENSIVE METABOLIC PANEL - Abnormal; Notable for the following:    CO2 17 (*)    Glucose, Bld 154 (*)    BUN 54 (*)    Creatinine, Ser 4.53 (*)    Calcium 8.1 (*)    Total Protein 6.4 (*)    Albumin 3.2 (*)    Alkaline Phosphatase 142 (*)    GFR calc non Af Amer 13 (*)    GFR calc Af Amer 16 (*)    All other components within normal limits  BRAIN NATRIURETIC PEPTIDE - Abnormal; Notable for the following:    B Natriuretic Peptide 800.3 (*)    All other components within normal limits  TROPONIN I    EKG  EKG Interpretation  Date/Time:  Wednesday Feb 05 2017 01:16:44 EDT Ventricular Rate:  68 PR Interval:    QRS Duration: 100 QT Interval:  423 QTC Calculation: 450 R  Axis:   -30 Text Interpretation:  Sinus rhythm Left atrial enlargement Left axis deviation Artifact in lead(s) I aVR V1 Confirmed by Betsey Holiday  MD, CHRISTOPHER [phone]) on 02/05/2017 2:03:24 AM       Radiology Dg Chest 2 View  Result Date: 02/05/2017 CLINICAL DATA:  Lower extremity swelling for 2 weeks. EXAM: CHEST  2 VIEW COMPARISON:  11/05/2008 FINDINGS: Mild cardiomegaly. Mild vascular and interstitial prominence, new. No airspace consolidation. Small right pleural effusion. IMPRESSION: Vascular and interstitial prominence. Small right pleural effusion. Cardiomegaly. The findings likely represent mild congestive heart failure. Electronically Signed   By: Andreas Newport M.D.   On: 02/05/2017 01:45  Procedures Procedures (including critical care time)  Medications Ordered in ED Medications  furosemide (LASIX) injection 40 mg (not administered)     Initial Impression / Assessment and Plan / ED Course  I have reviewed the triage vital signs and the nursing notes.  Pertinent labs & imaging results that were available during my care of the patient were reviewed by me and considered in my medical decision making (see chart for details).     Patient presents to the emergency department for evaluation of swelling. Patient reports that he has noticed recently that he has been experiencing swelling of his legs. He reports that the swelling would occur during the course of the day but improved after he slept all night. Patient reports the swelling has worsened. He is now noticing swelling his entire legs bilaterally and his testicles are swollen. Examination reveals pitting edema up to the lower abdomen including significant scrotal edema consistent with anasarca. Lab work reveals acute renal failure. He has a creatinine of 4.53 with a non-anion gap acidosis. Bladder scan does not reveal any outlet obstruction. BNP is elevated and chest x-ray does show mild heart failure. Examination did reveal an  S4 gallop. He was administered IV Lasix, will require hospitalization for further management of acute renal failure. Renal ultrasound has been ordered.  Final Clinical Impressions(s) / ED Diagnoses   Final diagnoses:  Acute renal failure, unspecified acute renal failure type [facility])    New Prescriptions New Prescriptions   No medications on file   I personally performed the services described in this documentation, which was scribed in my presence. The recorded information has been reviewed and is accurate.     Orpah Greek, MD 02/05/17 [phone]

## 2017-02-05 NOTE — Progress Notes (Signed)
Have seen and assessed patient and agree with Dr.Niu's assessment and plan. Patient is a 56 year old gentleman history of diabetes hyperlipidemia tobacco abuse chronic kidney disease stage III, splenectomy presented with lower extremity swelling and testicular swelling. Patient noted to have a worsening renal function creatinine at 4.53 from 1.4 on 09/13/2010. Patient with a elevated BNP and a leukocytosis of 14.8. Chest x-ray with mild pulmonary edema. Renal ultrasound negative for hydronephrosis. Urine studies pending. Patient noted with volume overload and worsening renal function and a such will consult with nephrology for further evaluation and management. Concern for progressive chronic kidney disease.  No charge.

## 2017-02-05 NOTE — Consult Note (Signed)
Brian [name] Faires Admit Date: 02/05/2017 02/05/2017 Rexene Agent Requesting Physician:  Cline Cools  Reason for Consult:  AoCKD3, Edema HPI:  56 year old male admitted 02/05/17 with progressive edema and new finding of worsened renal failure.  PMH Incudes:  DM2 diagnosed 2001, no history of retinopathy or neuropathy; inconsistent outpatient care, not on ACE inhibitor or ARB  Hypertension, on amlodipine and beta blocker  Hyperlipidemia  Ongoing tobacco use  History of splenectomy  Presenting to the emergency room with 2 weeks of progressive edema and ascending from the legs into the testicles. Workup at that time identified a creatinine of 4.5 with the only comparable creatinine in 2011 at 1.4.  Patient with daily ibuprofen use, usually 400 mg once daily for various arthralgias of the legs and arms. Admission serum albumin 3.2. Urinalysis on 5/1 with 2+ protein, specific gravity 1.013, no pyuria, no hematuria. Renal ultrasound upon admission with bilateral normal-sized kidneys with normal echogenicity and no hydronephrosis. Blood pressures have been consistently elevated. Two-view chest x-ray with small right pleural effusion, cardiomegaly, some vascular congestion. Lipid panel upon admission with total cholesterol 176 and LDL of 99.  She was admitted and placed on 40 mg IV Lasix twice daily. So far 1.2 L of urine output. TTE pending.  He is employed as a Presenter, broadcasting on first shift, operate for much of the shift.   Creatinine, Ser  Date Value  02/05/2017 4.47 mg/dL (H)  02/04/2017 4.53 mg/dL (H)  09/13/2010 1.40 mg/dL  09/12/2010 1.31 mg/dL  09/11/2010 1.4 mg/dL  11/05/2008 1.15 mg/dL  11/04/2008 1.10 mg/dL  12/28/2007 1.00  ] I/Os: I/O last 3 completed shifts: In: -  Out: 300 [Urine:300]    ROS NSAIDS: daily home use ibuprofen 400mg  daily IV Contrast none TMP/SMX none Hypotension none Balance of 12 systems is negative w/ exceptions as above  PMH  Past Medical History:   Diagnosis Date  . Arthritis   . Diabetes mellitus without complication ([place])   . Hypertension   . Tobacco abuse    PSH  Past Surgical History:  Procedure Laterality Date  . PANCREAS SURGERY    . SPLENECTOMY     FH  Family History  Problem Relation Age of Onset  . Lymphoma Mother   . Hypertension Father   . Diabetes Mellitus II Father   . Hypertension Brother   . Diabetes Mellitus I Brother    SH  reports that he has been smoking Cigarettes.  He has been smoking about 1.00 pack per day. He has never used smokeless tobacco. He reports that he does not drink alcohol or use drugs. Allergies  Allergies  Allergen Reactions  . Penicillins Hives and Rash   Home medications Prior to Admission medications   Medication Sig Start Date End Date Taking? Authorizing Provider  amLODipine (NORVASC) 10 MG tablet Take 10 mg by mouth daily.   Yes Historical Provider, MD  atenolol (TENORMIN) 100 MG tablet Take 100 mg by mouth daily.   Yes Historical Provider, MD  HYDROcodone-acetaminophen (NORCO/VICODIN) 5-325 MG tablet Take 1-2 tablets by mouth every 6 (six) hours as needed (for pain). 06/23/16  Yes John Molpus, MD  ibuprofen (ADVIL,MOTRIN) 200 MG tablet Take 400 mg by mouth every 6 (six) hours as needed for moderate pain.   Yes Historical Provider, MD  insulin aspart protamine- aspart (NOVOLOG MIX 70/30) (70-30) 100 UNIT/ML injection Inject 0-20 Units into the skin at bedtime.    Yes Historical Provider, MD    Current Medications Scheduled Meds: .  amLODipine  10 mg Oral Daily  . atenolol  100 mg Oral Daily  . furosemide  40 mg Intravenous Q12H  . heparin subcutaneous  5,000 Units Subcutaneous Q8H  . insulin aspart  0-5 Units Subcutaneous QHS  . insulin aspart  0-9 Units Subcutaneous TID WC  . insulin aspart protamine- aspart  5 Units Subcutaneous QHS  . nicotine  21 mg Transdermal Daily  . [START ON 02/06/2017] pneumococcal 23 valent vaccine  0.5 mL Intramuscular Tomorrow-1000  . sodium  chloride flush  3 mL Intravenous Q12H   Continuous Infusions: PRN Meds:.acetaminophen **OR** acetaminophen, hydrALAZINE, HYDROcodone-acetaminophen, ondansetron **OR** ondansetron (ZOFRAN) IV, zolpidem  CBC  Recent Labs Lab 02/04/17 2310 02/05/17 0335  WBC 14.8* 14.7*  NEUTROABS 6.3  --   HGB 11.8* 11.2*  HCT 35.5* 34.7*  MCV 97.0 97.2  PLT 262 641   Basic Metabolic Panel  Recent Labs Lab 02/04/17 2310 02/05/17 0335  NA 135 138  K 4.6 4.3  CL 109 112*  CO2 17* 16*  GLUCOSE 154* 78  BUN 54* 54*  CREATININE 4.53* 4.47*  CALCIUM 8.1* 8.1*    Physical Exam  Blood pressure (!) 168/89, pulse 65, temperature 97.9 F (36.6 C), temperature source Oral, resp. rate 18, height 5\' 9"  (1.753 m), weight 101.2 kg (223 lb), SpO2 99 %. GEN: No acute distress, lying flat in the bed with normal respiratory effort ENT: NCAT EYES: EOMI CV: Regular rate and rhythm, no rub, normal S1 and S2 PULM: Clear bilaterally ABD: Soft, nontender, no abdominal bruits SKIN: No rashes or lesions EXT: 3+ lower extremity edema   Assessment , 56 year old male with renal failure of unclear time course but some pre-existing CKD in 2011, progressive edema, hypertension  1. AoCKD3 vs Progressive CKD: Very likely etiology is progressive diabetic kidney disease. Also possible is NSAID related renal injury either acute or chronic. He is not obstructed. Does not appear that he has nephrotic but will quantify proteinuria. He is young but given the lack of clarity here we should exclude a monoclonal protein.  2. normal anion gap metabolic acidosis: Most likely due to impaired ammonia generation 3. Type 2 diabetes 4. Hypertension 5. Edema / hypervolemia 6. Chronic nonsteroidal use for various arthralgias   Plan 1. UP/C 2. Given hypervolemia, we'll not start sodium bicarbonate at the current time 3. Continue Lasix at current dosing, appears effective 4. Dietary consult for dietary sodium information 5. SPEP  and serum free light chains 6. Await TTE 7. Stop all NSAIDs 8. Daily weights, Daily Renal Panel, Strict I/Os, Avoid nephrotoxins (NSAIDs, judicious IV Contrast)    Pearson Grippe MD [phone] pgr 02/05/2017, 3:24 PM

## 2017-02-06 ENCOUNTER — Other Ambulatory Visit (HOSPITAL_COMMUNITY): Payer: Self-pay

## 2017-02-06 LAB — CBC WITH DIFFERENTIAL/PLATELET
BASOS PCT: 1 %
Basophils Absolute: 0.1 10*3/uL (ref 0.0–0.1)
Eosinophils Absolute: 0.6 10*3/uL (ref 0.0–0.7)
Eosinophils Relative: 5 %
HEMATOCRIT: 35 % — AB (ref 39.0–52.0)
Hemoglobin: 11.4 g/dL — ABNORMAL LOW (ref 13.0–17.0)
Lymphocytes Relative: 42 %
Lymphs Abs: 5.1 10*3/uL — ABNORMAL HIGH (ref 0.7–4.0)
MCH: 31.4 pg (ref 26.0–34.0)
MCHC: 32.6 g/dL (ref 30.0–36.0)
MCV: 96.4 fL (ref 78.0–100.0)
MONO ABS: 1.9 10*3/uL — AB (ref 0.1–1.0)
Monocytes Relative: 15 %
NEUTROS ABS: 4.6 10*3/uL (ref 1.7–7.7)
Neutrophils Relative %: 37 %
Platelets: 246 10*3/uL (ref 150–400)
RBC: 3.63 MIL/uL — ABNORMAL LOW (ref 4.22–5.81)
RDW: 13.3 % (ref 11.5–15.5)
WBC: 12.2 10*3/uL — ABNORMAL HIGH (ref 4.0–10.5)

## 2017-02-06 LAB — PROTEIN ELECTROPHORESIS, SERUM
A/G Ratio: 1.1 (ref 0.7–1.7)
ALBUMIN ELP: 3.1 g/dL (ref 2.9–4.4)
ALPHA-1-GLOBULIN: 0.2 g/dL (ref 0.0–0.4)
ALPHA-2-GLOBULIN: 0.8 g/dL (ref 0.4–1.0)
BETA GLOBULIN: 0.9 g/dL (ref 0.7–1.3)
GAMMA GLOBULIN: 1 g/dL (ref 0.4–1.8)
Globulin, Total: 2.9 g/dL (ref 2.2–3.9)
Total Protein ELP: 6 g/dL (ref 6.0–8.5)

## 2017-02-06 LAB — RENAL FUNCTION PANEL
ALBUMIN: 2.7 g/dL — AB (ref 3.5–5.0)
ANION GAP: 10 (ref 5–15)
BUN: 55 mg/dL — ABNORMAL HIGH (ref 6–20)
CALCIUM: 8 mg/dL — AB (ref 8.9–10.3)
CO2: 20 mmol/L — ABNORMAL LOW (ref 22–32)
Chloride: 108 mmol/L (ref 101–111)
Creatinine, Ser: 4.62 mg/dL — ABNORMAL HIGH (ref 0.61–1.24)
GFR, EST AFRICAN AMERICAN: 15 mL/min — AB (ref >60)
GFR, EST NON AFRICAN AMERICAN: 13 mL/min — AB (ref >60)
GLUCOSE: 91 mg/dL (ref 65–99)
PHOSPHORUS: 5.5 mg/dL — AB (ref 2.5–4.6)
Potassium: 4.1 mmol/L (ref 3.5–5.1)
SODIUM: 138 mmol/L (ref 135–145)

## 2017-02-06 LAB — KAPPA/LAMBDA LIGHT CHAINS
KAPPA FREE LGHT CHN: 94.9 mg/L — AB (ref 3.3–19.4)
Kappa, lambda light chain ratio: 1.44 (ref 0.26–1.65)
Lambda free light chains: 65.9 mg/L — ABNORMAL HIGH (ref 5.7–26.3)

## 2017-02-06 LAB — HEMOGLOBIN A1C
Hgb A1c MFr Bld: 5.8 % — ABNORMAL HIGH (ref 4.8–5.6)
MEAN PLASMA GLUCOSE: 120 mg/dL

## 2017-02-06 LAB — GLUCOSE, CAPILLARY
GLUCOSE-CAPILLARY: 131 mg/dL — AB (ref 65–99)
GLUCOSE-CAPILLARY: 124 mg/dL — AB (ref 65–99)
Glucose-Capillary: 85 mg/dL (ref 65–99)
Glucose-Capillary: 144 mg/dL — ABNORMAL HIGH (ref 65–99)

## 2017-02-06 LAB — UREA NITROGEN, URINE: UREA NITROGEN UR: 391 mg/dL

## 2017-02-06 NOTE — Progress Notes (Signed)
PROGRESS NOTE    Brian [name]  KAJ:[id number] DOB: 1961/09/23 DOA: 02/05/2017 PCP: [name], MD    Brief Narrative:  Patient's a 56 year old gentleman history of diabetes, hyperlipidemia, tobacco abuse, chronic kidney disease stage III, splenectomy presenting with lower extremity swelling and noted to be in acute on chronic kidney disease with a worsening renal function versus progressive chronic kidney disease. Patient placed on IV Lasix and diuresed well. Nephrology consulted.   Assessment & Plan:   Principal Problem:   Acute renal failure superimposed on stage 3 chronic kidney disease (HCC) Active Problems:   Hypertension   Diabetes mellitus without complication (HCC)   Anasarca   Tobacco abuse   Leukocytosis  #1 acute on chronic kidney disease stage III versus progressive chronic kidney disease Patient had presented with progressive edema, hypertension, worsening renal function. Concern for acute on chronic kidney disease versus progressive chronic kidney disease. Patient also noted to be on NSAIDs prior to admission which has subsequently been discontinued. Patient has been seen in consultation by nephrology and patient has been noted to have nephrotic range proteinuria and likely progressive diabetic kidney disease. Nephrology. Patient with good urine output. Patient is -4.5 L over the past 24 hours and -5.5 L during the hospitalization. Renal ultrasound negative for hydronephrosis. Creatinine currently at 4.6 to from 4.47 from 4.53 on admission. Continue current regimen of IV Lasix. Hepatitis B hepatitis C, complement, ANA, RPR has been ordered per nephrology. Continue strict I's and O's. Daily weights. Avoid nephrotoxins. Per nephrology.  #2 hypertension Still somewhat elevated however improving. Continue Norvasc, atenolol, IV Lasix. Follow.  #3 diabetes mellitus Hemoglobin A1c was 5.8. CBGs have ranged from 85-131. Continued decrease dose of 70/35 units daily.  Continue sliding scale insulin.  #4 edema/volume overload/anasarca Likely secondary to progressive chronic kidney disease. Patient with good urine output and is -5.5 L during this hospitalization. Patient currently on IV Lasix. Follow.  #5 tobacco abuse Tobacco cessation. Nicotine patch.  #6 leukocytosis Patient currently afebrile. No signs or symptoms of infection. Urinalysis is nitrite negative leukocytes negative. WBC trending down. Follow.     DVT prophylaxis: Heparin Code Status: Full Family Communication: Updated patient. No family at bedside. Disposition Plan: Home once renal function has improved and improvement with volume overload and per nephrology.   Consultants:   Nephrology: Dr. Joelyn Oms 02/05/2017  Procedures:   Chest x-ray 02/05/2017  Renal ultrasound 02/05/2017  Antimicrobials:   None    Subjective: Patient laying in bed. Denies shortness of breath. No chest pain. Patient feels swelling is getting better. Patient states has had good urine output.  Objective: Vitals:   02/05/17 2155 02/06/17 0454 02/06/17 0900 02/06/17 0949  BP: 139/67 (!) 144/92 (!) 159/84 (!) 159/84  Pulse: 66 66 (!) 59   Resp: 18 17 16    Temp: 98.5 F (36.9 C) 98 F (36.7 C) 98.6 F (37 C)   TempSrc: Oral Oral Oral   SpO2: 96% 98% 99%   Weight: 98 kg (216 lb)     Height:        Intake/Output Summary (Last 24 hours) at 02/06/17 1524 Last data filed at 02/06/17 1500  Gross per 24 hour  Intake                3 ml  Output             4400 ml  Net            -4397 ml   [employer]  02/05/17 0440 02/05/17 2155  Weight: 101.2 kg (223 lb) 98 kg (216 lb)    Examination:  General exam: Appears calm and comfortable  Respiratory system: Clear to auscultation. Respiratory effort normal. Cardiovascular system: S1 & S2 heard, RRR. No JVD, murmurs, rubs, gallops or clicks. 2+ bilateral lower extremity edema. Gastrointestinal system: Abdomen is nondistended, soft and  nontender. No organomegaly or masses felt. Normal bowel sounds heard. Central nervous system: Alert and oriented. No focal neurological deficits. Extremities: Symmetric 5 x 5 power. 2+ bilateral lower extremity edema. Skin: No rashes, lesions or ulcers Psychiatry: Judgement and insight appear normal. Mood & affect appropriate.     Data Reviewed: I have personally reviewed following labs and imaging studies  CBC:  Recent Labs Lab 02/04/17 2310 02/05/17 0335 02/06/17 0522  WBC 14.8* 14.7* 12.2*  NEUTROABS 6.3  --  4.6  HGB 11.8* 11.2* 11.4*  HCT 35.5* 34.7* 35.0*  MCV 97.0 97.2 96.4  PLT 262 241 332   Basic Metabolic Panel:  Recent Labs Lab 02/04/17 2310 02/05/17 0335 02/06/17 0522  NA 135 138 138  K 4.6 4.3 4.1  CL 109 112* 108  CO2 17* 16* 20*  GLUCOSE 154* 78 91  BUN 54* 54* 55*  CREATININE 4.53* 4.47* 4.62*  CALCIUM 8.1* 8.1* 8.0*  PHOS  --   --  5.5*   GFR: Estimated Creatinine Clearance: 21.1 mL/min (A) (by C-G formula based on SCr of 4.62 mg/dL (H)). Liver Function Tests:  Recent Labs Lab 02/04/17 2310 02/06/17 0522  AST 35  --   ALT 23  --   ALKPHOS 142*  --   BILITOT 0.4  --   PROT 6.4*  --   ALBUMIN 3.2* 2.7*   No results for input(s): LIPASE, AMYLASE in the last 168 hours. No results for input(s): AMMONIA in the last 168 hours. Coagulation Profile: No results for input(s): INR, PROTIME in the last 168 hours. Cardiac Enzymes:  Recent Labs Lab 02/04/17 2310 02/05/17 0335 02/05/17 0819 02/05/17 1433  TROPONINI <0.03 <0.03 <0.03 0.03*   BNP (last 3 results) No results for input(s): PROBNP in the last 8760 hours. HbA1C:  Recent Labs  02/05/17 0335  HGBA1C 5.8*   CBG:  Recent Labs Lab 02/05/17 1132 02/05/17 1634 02/05/17 2157 02/06/17 0755 02/06/17 1229  GLUCAP 82 81 92 85 144*   Lipid Profile:  Recent Labs  02/05/17 0335  CHOL 176  HDL 38*  LDLCALC 99  TRIG 197*  CHOLHDL 4.6   Thyroid Function Tests: No results  for input(s): TSH, T4TOTAL, FREET4, T3FREE, THYROIDAB in the last 72 hours. Anemia Panel: No results for input(s): VITAMINB12, FOLATE, FERRITIN, TIBC, IRON, RETICCTPCT in the last 72 hours. Sepsis Labs: No results for input(s): PROCALCITON, LATICACIDVEN in the last 168 hours.  No results found for this or any previous visit (from the past 240 hour(s)).       Radiology Studies: Dg Chest 2 View  Result Date: 02/05/2017 CLINICAL DATA:  Lower extremity swelling for 2 weeks. EXAM: CHEST  2 VIEW COMPARISON:  11/05/2008 FINDINGS: Mild cardiomegaly. Mild vascular and interstitial prominence, new. No airspace consolidation. Small right pleural effusion. IMPRESSION: Vascular and interstitial prominence. Small right pleural effusion. Cardiomegaly. The findings likely represent mild congestive heart failure. Electronically Signed   By: Andreas Newport M.D.   On: 02/05/2017 01:45   US Renal  Result Date: 02/05/2017 CLINICAL DATA:  Renal failure. EXAM: RENAL / URINARY TRACT ULTRASOUND COMPLETE COMPARISON:  None. FINDINGS: Right Kidney: Length: 10.8  cm. Echogenicity within normal limits. No mass or hydronephrosis visualized. Left Kidney: Length: 10.4 cm. Echogenicity within normal limits. No mass or hydronephrosis visualized. Bladder: Appears normal for degree of bladder distention. IMPRESSION: No hydronephrosis. Previously observed right renal calculus is not well seen today. Electronically Signed   By: Andreas Newport M.D.   On: 02/05/2017 03:33        Scheduled Meds: . amLODipine  10 mg Oral Daily  . atenolol  100 mg Oral Daily  . furosemide  40 mg Intravenous Q12H  . heparin subcutaneous  5,000 Units Subcutaneous Q8H  . insulin aspart  0-5 Units Subcutaneous QHS  . insulin aspart  0-9 Units Subcutaneous TID WC  . insulin aspart protamine- aspart  5 Units Subcutaneous QHS  . nicotine  21 mg Transdermal Daily  . sodium chloride flush  3 mL Intravenous Q12H   Continuous Infusions:   LOS: 1  day    Time spent: 35 minutes    Mckenley Birenbaum, MD [facility] Pager [phone]  If 7PM-7AM, please contact night-coverage [email or web address] Password [email or web address] 02/06/2017, 3:24 PM

## 2017-02-06 NOTE — Progress Notes (Signed)
Admit: 02/05/2017 LOS: 62  56 year old male with renal failure of unclear time course but some pre-existing CKD in 2011, progressive edema, hypertension  Subjective:  Excellent urine output in the past 24 hours Patient reports improved edema  Blood pressures are somewhat improved but remain elevated Renal function is stable, not improved. TTE pending Renal ultrasound without obstruction; normal-sized kidneys. Weight down 7 pounds Patient with nephrotic range proteinuria  05/02 0701 - 05/03 0700 In: 483 [P.O.:480; I.V.:3] Out: 4700 [Urine:4700]  Filed Weights   02/05/17 0440 02/05/17 2155  Weight: 101.2 kg (223 lb) 98 kg (216 lb)    Scheduled Meds: . amLODipine  10 mg Oral Daily  . atenolol  100 mg Oral Daily  . furosemide  40 mg Intravenous Q12H  . heparin subcutaneous  5,000 Units Subcutaneous Q8H  . insulin aspart  0-5 Units Subcutaneous QHS  . insulin aspart  0-9 Units Subcutaneous TID WC  . insulin aspart protamine- aspart  5 Units Subcutaneous QHS  . nicotine  21 mg Transdermal Daily  . sodium chloride flush  3 mL Intravenous Q12H   Continuous Infusions: PRN Meds:.acetaminophen **OR** acetaminophen, hydrALAZINE, HYDROcodone-acetaminophen, ondansetron **OR** ondansetron (ZOFRAN) IV, zolpidem  Current Labs: reviewed  Results for Brian [name], KABAT (MRN [id number]) as of 02/06/2017 11:31  Ref. Range 02/05/2017 22:19  Protein Creatinine Ratio Latest Ref Range: 0.00 - 0.15 mg/mgCre 6.17 (H)   Results for JANSON, Brian [name] (MRN [id number]) as of 02/06/2017 11:31  Ref. Range 02/05/2017 03:35  HIV Latest Ref Range: Non Reactive  Non Reactive    Physical Exam:  Blood pressure (!) 159/84, pulse (!) 59, temperature 98.6 F (37 C), temperature source Oral, resp. rate 16, height 5\' 9"  (1.753 m), weight 98 kg (216 lb), SpO2 99 %.   A 1. AoCKD3 vs Progressive CKD with nephrotic proteinuria: Very likely etiology is progressive diabetic kidney disease. Also possible is NSAID related renal  injury either acute or chronic. He is not obstructed. Monoclonal evaluation in progress 2. normal anion gap metabolic acidosis: Most likely due to impaired ammonia generation 3. Type 2 diabetes with A1c during this admission less than 6% 4. Hypertension 5. Edema / hypervolemia 6. Chronic nonsteroidal use for various arthralgias  P 1. His nephrotic proteinuria is likely diabetic in nature given long-standing diabetes, however will follow-up on serologies at the current time: Hepatitis B, hepatitis C, complement, ANA, RPR 2. Continue diuretics 3. Daily weights, Daily Renal Panel, Strict I/Os, Avoid nephrotoxins (NSAIDs, judicious IV Contrast)   Pearson Grippe MD 02/06/2017, 11:31 AM   Recent Labs Lab 02/04/17 2310 02/05/17 0335 02/06/17 0522  NA 135 138 138  K 4.6 4.3 4.1  CL 109 112* 108  CO2 17* 16* 20*  GLUCOSE 154* 78 91  BUN 54* 54* 55*  CREATININE 4.53* 4.47* 4.62*  CALCIUM 8.1* 8.1* 8.0*  PHOS  --   --  5.5*    Recent Labs Lab 02/04/17 2310 02/05/17 0335 02/06/17 0522  WBC 14.8* 14.7* 12.2*  NEUTROABS 6.3  --  4.6  HGB 11.8* 11.2* 11.4*  HCT 35.5* 34.7* 35.0*  MCV 97.0 97.2 96.4  PLT 262 241 246

## 2017-02-06 NOTE — Plan of Care (Signed)
Problem: Food- and Nutrition-Related Knowledge Deficit (NB-1.1) Goal: Nutrition education Formal process to instruct or train a patient/client in a skill or to impart knowledge to help patients/clients voluntarily manage or modify food choices and eating behavior to maintain or improve health. Outcome: Completed/Met Date Met: 02/06/17 Nutrition Education Note  RD consulted for nutrition education regarding 2 gram sodium diet.  RD provided "Low Sodium Nutrition Therapy" handout from the Academy of [employer]. Reviewed patient's dietary recall. Patient reports going out to eat at fast food restaurants 3 times a week. Provided examples on ways to decrease sodium intake in diet. Discouraged intake of processed foods and use of salt shaker. Encouraged fresh fruits and vegetables as well as whole grain sources of carbohydrates to maximize fiber intake.   RD discussed why it is important for patient to adhere to diet recommendations, and emphasized  foods to avoid. Teach back method used.  Expect good compliance.  Body mass index is 31.9 kg/m. Pt meets criteria for class I obesity based on current BMI.  Current diet order is renal/carbohydrate modified, patient is consuming approximately 100% of meals at this time. Labs and medications reviewed. No further nutrition interventions warranted at this time. RD contact information provided. If additional nutrition issues arise, please re-consult RD.   Corrin Parker, MS, RD, LDN Pager # [phone] After hours/ weekend pager # [phone]

## 2017-02-07 ENCOUNTER — Inpatient Hospital Stay (HOSPITAL_COMMUNITY): Payer: No Typology Code available for payment source

## 2017-02-07 DIAGNOSIS — I361 Nonrheumatic tricuspid (valve) insufficiency: Secondary | ICD-10-CM

## 2017-02-07 LAB — C3 COMPLEMENT: C3 COMPLEMENT: 121 mg/dL (ref 82–167)

## 2017-02-07 LAB — RENAL FUNCTION PANEL
Albumin: 3 g/dL — ABNORMAL LOW (ref 3.5–5.0)
Anion gap: 12 (ref 5–15)
BUN: 56 mg/dL — AB (ref 6–20)
CALCIUM: 8.1 mg/dL — AB (ref 8.9–10.3)
CO2: 19 mmol/L — AB (ref 22–32)
CREATININE: 4.72 mg/dL — AB (ref 0.61–1.24)
Chloride: 106 mmol/L (ref 101–111)
GFR calc Af Amer: 15 mL/min — ABNORMAL LOW (ref >60)
GFR calc non Af Amer: 13 mL/min — ABNORMAL LOW (ref >60)
Glucose, Bld: 100 mg/dL — ABNORMAL HIGH (ref 65–99)
Phosphorus: 5.5 mg/dL — ABNORMAL HIGH (ref 2.5–4.6)
Potassium: 3.8 mmol/L (ref 3.5–5.1)
SODIUM: 137 mmol/L (ref 135–145)

## 2017-02-07 LAB — CBC
HCT: 34.7 % — ABNORMAL LOW (ref 39.0–52.0)
Hemoglobin: 11.3 g/dL — ABNORMAL LOW (ref 13.0–17.0)
MCH: 31.4 pg (ref 26.0–34.0)
MCHC: 32.6 g/dL (ref 30.0–36.0)
MCV: 96.4 fL (ref 78.0–100.0)
PLATELETS: 222 10*3/uL (ref 150–400)
RBC: 3.6 MIL/uL — AB (ref 4.22–5.81)
RDW: 12.9 % (ref 11.5–15.5)
WBC: 12.2 10*3/uL — ABNORMAL HIGH (ref 4.0–10.5)

## 2017-02-07 LAB — RPR: RPR Ser Ql: NONREACTIVE

## 2017-02-07 LAB — HEPATITIS B SURFACE ANTIGEN: HEP B S AG: NEGATIVE

## 2017-02-07 LAB — ECHOCARDIOGRAM COMPLETE
Height: 69 in
Weight: 3316.8 oz

## 2017-02-07 LAB — ANTINUCLEAR ANTIBODIES, IFA: ANTINUCLEAR ANTIBODIES, IFA: NEGATIVE

## 2017-02-07 LAB — GLUCOSE, CAPILLARY
GLUCOSE-CAPILLARY: 101 mg/dL — AB (ref 65–99)
GLUCOSE-CAPILLARY: 142 mg/dL — AB (ref 65–99)
Glucose-Capillary: 104 mg/dL — ABNORMAL HIGH (ref 65–99)

## 2017-02-07 LAB — HEPATITIS C ANTIBODY

## 2017-02-07 LAB — C4 COMPLEMENT: COMPLEMENT C4, BODY FLUID: 19 mg/dL (ref 14–44)

## 2017-02-07 MED ORDER — INSULIN ASPART PROT & ASPART (70-30 MIX) 100 UNIT/ML ~~LOC~~ SUSP: 5.0000 [IU] | Freq: Every day | SUBCUTANEOUS | 0 refills | Status: AC

## 2017-02-07 MED ORDER — FUROSEMIDE 80 MG PO TABS
80.0000 mg | ORAL_TABLET | Freq: Two times a day (BID) | ORAL | Status: DC
  Administered 2017-02-07: 80 mg via ORAL
  Filled 2017-02-07: qty 1

## 2017-02-07 MED ORDER — NICOTINE 21 MG/24HR TD PT24: 21.0000 mg | MEDICATED_PATCH | Freq: Every day | TRANSDERMAL | 0 refills | Status: DC

## 2017-02-07 MED ORDER — FUROSEMIDE 80 MG PO TABS: 80.0000 mg | ORAL_TABLET | Freq: Two times a day (BID) | ORAL | 2 refills | Status: DC

## 2017-02-07 NOTE — Progress Notes (Signed)
  Echocardiogram 2D Echocardiogram has been performed.  Brian [name] Laboy 02/07/2017, 12:20 PM

## 2017-02-07 NOTE — Discharge Summary (Signed)
Physician Discharge Summary  Brian [name] ZDG:[id number] DOB: [birth year] DOA: 02/05/2017  PCP: [name], MD  Admit date: 02/05/2017 Discharge date: 02/07/2017  Time spent: 65 minutes  Recommendations for Outpatient Follow-up:  1. Follow-up with Dr. Joelyn Oms. Office will call with an appointment time. 2. Follow-up with [name], MD in 2 weeks. On follow-up patient will need a basic metabolic profile done to follow-up on electrolytes and renal function.   Discharge Diagnoses:  Principal Problem:   Acute renal failure superimposed on stage 3 chronic kidney disease ([place]) Active Problems:   Hypertension   Diabetes mellitus without complication ([place])   Anasarca   Tobacco abuse   Leukocytosis   Discharge Condition: Stable and improved  Diet recommendation: Heart healthy  Filed Weights   02/05/17 0440 02/05/17 2155 02/06/17 2058  Weight: 101.2 kg (223 lb) 98 kg (216 lb) 94 kg (207 lb 4.8 oz)    History of present illness:  Per Dr Karen Kays Wallenstein is a 56 y.o. male with medical history significant of diabetes mellitus, hyperlipidemia, tobacco abuse, CKD-III, arthritis, splenectomy, who presented with testicular swelling and leg swelling.  Patient stated that he noticed testicular swelling one week ago, which has been progressively getting worse. He also has bilateral leg swelling, which has also been progressively getting worse. No testicular injury or testicular pain. Patient denied chest pain, SOB, cough, symptoms of UTI or GI symptoms. No unilateral weakness.   ED Course: pt was found to have worsening renal function with creatinine up from 1.40 on 09/13/10 to 4.53, bicarbonate 17, BUN 54, negative troponin, BNP 800.3, negative urinalysis, WBC 14.8, temperature normal, no tachycardia, oxygen saturation 95% on room air. Chest x-ray showed mild pulmonary edema. US-renal showed no hydronephrosis. Pt is admitted to tele bed as inpt.  Hospital Course:  #1 acute on  chronic kidney disease stage III versus Probable progressive chronic kidney disease Patient had presented with progressive edema, hypertension, worsening renal function. Concern for acute on chronic kidney disease versus progressive chronic kidney disease. Patient also noted to be on NSAIDs prior to admission which has subsequently been discontinued. Patient has been seen in consultation by nephrology and patient has been noted to have nephrotic range proteinuria and likely progressive diabetic kidney disease. Patient was placed on IV diuretics during the hospitalization with good urine output and was -8.369 L during the hospitalization. Renal ultrasound negative for hydronephrosis. Creatinine fluctuated during the hospitalization patient's creatinine was at 4.72 by day discharge. Serologies obtained off hepatitis B and C were negative. C4 was 19. ANA was negative. RPR nonreactive. Patient improved clinically. Patient was subsequently transitioned from IV diuretics to oral diuretics. Is recommended that patient avoid nephrotoxins and no more NSAIDs. Patient will follow-up with nephrology in outpatient setting for further evaluation and close follow-up with lab work.   #2 hypertension Patient was maintained on home regimen of Norvasc and atenolol. Patient was also on IV Lasix during the hospitalization and transitioned to oral Lasix. Outpatient follow-up.   #3 diabetes mellitus Hemoglobin A1c was 5.8. CBGs ranged in the 100s during the hospitalization. Patient's home insulin dose of 70/30 was decreased to 5 units daily. Patient was maintained on sliding scale insulin. Outpatient follow-up.   #4 edema/volume overload/anasarca Likely secondary to progressive chronic kidney disease. Patient was placed on IV Lasix and nephrology consulted and patient had good urine output and was - 8.369 L during this hospitalization. Patient improved clinically and was subsequently transitioned to oral Lasix 80 mg twice  daily per  nephrology. 2-D echo obtained at a EF of 60-65%, no wall motion abnormalities, grade 2 diastolic dysfunction. Patient will follow-up with nephrology and PCP in outpatient setting.  #5 tobacco abuse Tobacco cessation. Patient was placed on a Nicotine patch.  #6 leukocytosis Patient noted to have a leukocytosis on admission. Patient remained afebrile throughout the hospitalization. Urinalysis which was done was unremarkable. WBC trended down. Outpatient follow-up.       Procedures:  Chest x-ray 02/05/2017  Renal ultrasound 02/05/2017  2-D echo 02/07/2017  Consultations:  Nephrology: Dr. Joelyn Oms 02/05/2017  Discharge Exam: Vitals:   02/07/17 0429 02/07/17 1006  BP: (!) 142/70 (!) 149/78  Pulse: 63 64  Resp: 17 16  Temp: 98 F (36.7 C) 97.9 F (36.6 C)    General: NAD Cardiovascular: RRR Respiratory: CTAB  Discharge Instructions   Discharge Instructions    Diet - low sodium heart healthy    Complete by:  As directed    Increase activity slowly    Complete by:  As directed      Current Discharge Medication List    START taking these medications   Details  furosemide (LASIX) 80 MG tablet Take 1 tablet (80 mg total) by mouth 2 (two) times daily. Qty: 60 tablet, Refills: 2    nicotine (NICODERM CQ - DOSED IN MG/24 HOURS) 21 mg/24hr patch Place 1 patch (21 mg total) onto the skin daily. Qty: 28 patch, Refills: 0      CONTINUE these medications which have CHANGED   Details  insulin aspart protamine- aspart (NOVOLOG MIX 70/30) (70-30) 100 UNIT/ML injection Inject 0.05 mLs (5 Units total) into the skin at bedtime. Qty: 10 mL, Refills: 0      CONTINUE these medications which have NOT CHANGED   Details  amLODipine (NORVASC) 10 MG tablet Take 10 mg by mouth daily.    atenolol (TENORMIN) 100 MG tablet Take 100 mg by mouth daily.    HYDROcodone-acetaminophen (NORCO/VICODIN) 5-325 MG tablet Take 1-2 tablets by mouth every 6 (six) hours as needed  (for pain). Qty: 20 tablet, Refills: 0      STOP taking these medications     ibuprofen (ADVIL,MOTRIN) 200 MG tablet        Allergies  Allergen Reactions  . Penicillins Hives and Rash   Follow-up Information    SANFORD, Meredith Leeds, MD Follow up.   Specialty:  Nephrology Why:  Office will call with appointment time. Contact information: [place] [id number] [phone]        [name], MD. Schedule an appointment as soon as possible for a visit in 2 week(s).   Specialty:  Family Medicine Contact information: [place] [address] [phone]            The results of significant diagnostics from this hospitalization (including imaging, microbiology, ancillary and laboratory) are listed below for reference.    Significant Diagnostic Studies: Dg Chest 2 View  Result Date: 02/05/2017 CLINICAL DATA:  Lower extremity swelling for 2 weeks. EXAM: CHEST  2 VIEW COMPARISON:  11/05/2008 FINDINGS: Mild cardiomegaly. Mild vascular and interstitial prominence, new. No airspace consolidation. Small right pleural effusion. IMPRESSION: Vascular and interstitial prominence. Small right pleural effusion. Cardiomegaly. The findings likely represent mild congestive heart failure. Electronically Signed   By: Andreas Newport M.D.   On: 02/05/2017 01:45   US Renal  Result Date: 02/05/2017 CLINICAL DATA:  Renal failure. EXAM: RENAL / URINARY TRACT ULTRASOUND COMPLETE COMPARISON:  None. FINDINGS: Right Kidney:  Length: 10.8 cm. Echogenicity within normal limits. No mass or hydronephrosis visualized. Left Kidney: Length: 10.4 cm. Echogenicity within normal limits. No mass or hydronephrosis visualized. Bladder: Appears normal for degree of bladder distention. IMPRESSION: No hydronephrosis. Previously observed right renal calculus is not well seen today. Electronically Signed   By: Andreas Newport M.D.   On: 02/05/2017 03:33    Microbiology: No  results found for this or any previous visit (from the past 240 hour(s)).   Labs: Basic Metabolic Panel:  Recent Labs Lab 02/04/17 2310 02/05/17 0335 02/06/17 0522 02/07/17 0716  NA 135 138 138 137  K 4.6 4.3 4.1 3.8  CL 109 112* 108 106  CO2 17* 16* 20* 19*  GLUCOSE 154* 78 91 100*  BUN 54* 54* 55* 56*  CREATININE 4.53* 4.47* 4.62* 4.72*  CALCIUM 8.1* 8.1* 8.0* 8.1*  PHOS  --   --  5.5* 5.5*   Liver Function Tests:  Recent Labs Lab 02/04/17 2310 02/06/17 0522 02/07/17 0716  AST 35  --   --   ALT 23  --   --   ALKPHOS 142*  --   --   BILITOT 0.4  --   --   PROT 6.4*  --   --   ALBUMIN 3.2* 2.7* 3.0*   No results for input(s): LIPASE, AMYLASE in the last 168 hours. No results for input(s): AMMONIA in the last 168 hours. CBC:  Recent Labs Lab 02/04/17 2310 02/05/17 0335 02/06/17 0522 02/07/17 0420  WBC 14.8* 14.7* 12.2* 12.2*  NEUTROABS 6.3  --  4.6  --   HGB 11.8* 11.2* 11.4* 11.3*  HCT 35.5* 34.7* 35.0* 34.7*  MCV 97.0 97.2 96.4 96.4  PLT 262 241 246 222   Cardiac Enzymes:  Recent Labs Lab 02/04/17 2310 02/05/17 0335 02/05/17 0819 02/05/17 1433  TROPONINI <0.03 <0.03 <0.03 0.03*   BNP: BNP (last 3 results)  Recent Labs  02/04/17 2310  BNP 800.3*    ProBNP (last 3 results) No results for input(s): PROBNP in the last 8760 hours.  CBG:  Recent Labs Lab 02/06/17 1229 02/06/17 1728 02/06/17 2102 02/07/17 0758 02/07/17 1221  GLUCAP 144* 131* 124* 104* 101*       Signed:  Aiyah Scarpelli MD.  [facility] 02/07/2017, 5:15 PM

## 2017-02-07 NOTE — Progress Notes (Signed)
Admit: 02/05/2017 LOS: 21  56 year old male with renal failure of unclear time course but some pre-existing CKD in 2011, progressive edema, hypertension  Subjective:  Excellent urine output in the past 24 hours Weight down 16lb He asked that I updated his sister regarding current status, I was able to reach her by phone Urine protein creatinine ratios consistent with nephrotic range proteinuria. I discussed that this nephrotic syndrome is likely diabetic but I have looked for other causes. Serologies are negative. Monoclonal evaluation is negative. I discussed that in these circumstances we can consider and might wish to move forward on renal biopsy for diagnostic and prognostic purposes. He is open to this but wishes to do it as an outpatient, if at all, and I support that. I did offer that he could stay in the hospital until Monday for biopsy but that we can also do it as an outpatient. Dietitian was able to meet with patient for education regarding sodium restriction  05/03 0701 - 05/04 0700 In: 1083 [P.O.:1080; I.V.:3] Out: 4750 [DUKGU:[id number]]  Filed Weights   02/05/17 0440 02/05/17 2155 02/06/17 2058  Weight: 101.2 kg (223 lb) 98 kg (216 lb) 94 kg (207 lb 4.8 oz)    Scheduled Meds: . amLODipine  10 mg Oral Daily  . atenolol  100 mg Oral Daily  . furosemide  40 mg Intravenous Q12H  . heparin subcutaneous  5,000 Units Subcutaneous Q8H  . insulin aspart  0-5 Units Subcutaneous QHS  . insulin aspart  0-9 Units Subcutaneous TID WC  . insulin aspart protamine- aspart  5 Units Subcutaneous QHS  . nicotine  21 mg Transdermal Daily  . sodium chloride flush  3 mL Intravenous Q12H   Continuous Infusions: PRN Meds:.acetaminophen **OR** acetaminophen, hydrALAZINE, HYDROcodone-acetaminophen, ondansetron **OR** ondansetron (ZOFRAN) IV, zolpidem  Current Labs: reviewed  Renal ultrasound without obstruction; normal-sized kidneys.  Results for JAILYN, LANGHORST (MRN [id number]) as of 02/06/2017  11:31  Ref. Range 02/05/2017 22:19  Protein Creatinine Ratio Latest Ref Range: 0.00 - 0.15 mg/mgCre 6.17 (H)   Results for ELVEN, LABOY (MRN [id number]) as of 02/06/2017 11:31  Ref. Range 02/05/2017 03:35  HIV Latest Ref Range: Non Reactive  Non Reactive     Ref. Range 02/06/2017 11:39  RPR Latest Ref Range: Non Reactive  Non Reactive  Hepatitis B Surface Ag Latest Ref Range: Negative  Negative  HCV Ab Latest Ref Range: 0.0 - 0.9 s/co ratio <0.1    Ref. Range 02/06/2017 11:39  C3 Complement Latest Ref Range: 82 - 167 mg/dL 121  Complement C4, Body Fluid Latest Ref Range: 14 - 44 mg/dL 19   Results for CRIT, OBREMSKI (MRN [id number]) as of 02/07/2017 08:36  Ref. Range 02/05/2017 16:27  M-SPIKE, % Latest Ref Range: Not Observed g/dL Not Observed  Results for AAHIL, FREDIN (MRN [id number]) as of 02/07/2017 08:36  Ref. Range 02/05/2017 16:27  Kappa free light chain Latest Ref Range: 3.3 - 19.4 mg/L 94.9 (H)  Lamda free light chains Latest Ref Range: 5.7 - 26.3 mg/L 65.9 (H)  Kappa, lamda light chain ratio Latest Ref Range: 0.26 - 1.65  1.44   Physical Exam:  Blood pressure (!) 142/70, pulse 63, temperature 98 F (36.7 C), temperature source Oral, resp. rate 17, height 5\' 9"  (1.753 m), weight 94 kg (207 lb 4.8 oz), SpO2 98 %. No acute distress, ambulating in room Now has 1-2+ edema below the knees, certainly improved Regular rate and rhythm, normal S1 and S2, no murmur or  rub Lungs clear bilaterally with normal work of breathing, speaking in full sentences Abdomen is soft nontender, mildly obese No rashes or lesions NCAT, EOMI   A 1. AoCKD3 vs Progressive CKD with nephrotic proteinuria: Very likely etiology is progressive diabetic kidney disease. Also possible is NSAID related renal injury either acute or chronic. He is not obstructed. Serologies and monoclonal eval as above negative.  Could conside biopsy here but likely to show DN, could be iMN or FSGS.  We will continue to discuss role of  biopsy as an outpatient  2. normal anion gap metabolic acidosis: Most likely due to impaired ammonia generation; HCO3 up to 20 w/o alkali therapy, follow 3. Type 2 diabetes with A1c during this admission less than 6% 4. Hypertension improving with diuresis 5. Edema / hypervolemia successfully diuresing 6. Chronic nonsteroidal use for various arthralgias -- pt aware not to cont using NSAIDs  P 1. Okay with discharge at the current time, I will arrange close follow-up with me in the office including labs  2. Continue diuretics, change to Lasix 80 mg twice daily orally  3. Daily weights, Daily Renal Panel, Strict I/Os, Avoid nephrotoxins (NSAIDs, judicious IV Contrast)   Pearson Grippe MD 02/07/2017, 8:36 AM   Recent Labs Lab 02/04/17 2310 02/05/17 0335 02/06/17 0522  NA 135 138 138  K 4.6 4.3 4.1  CL 109 112* 108  CO2 17* 16* 20*  GLUCOSE 154* 78 91  BUN 54* 54* 55*  CREATININE 4.53* 4.47* 4.62*  CALCIUM 8.1* 8.1* 8.0*  PHOS  --   --  5.5*    Recent Labs Lab 02/04/17 2310 02/05/17 0335 02/06/17 0522 02/07/17 0420  WBC 14.8* 14.7* 12.2* 12.2*  NEUTROABS 6.3  --  4.6  --   HGB 11.8* 11.2* 11.4* 11.3*  HCT 35.5* 34.7* 35.0* 34.7*  MCV 97.0 97.2 96.4 96.4  PLT 262 241 246 222

## 2017-02-07 NOTE — Progress Notes (Signed)
PROGRESS NOTE    Brian [name]  VEL:[id number] DOB: 25-Dec-1960 DOA: 02/05/2017 PCP: [name], MD    Brief Narrative:  Patient's a 56 year old gentleman history of diabetes, hyperlipidemia, tobacco abuse, chronic kidney disease stage III, splenectomy presenting with lower extremity swelling and noted to be in acute on chronic kidney disease with a worsening renal function versus progressive chronic kidney disease. Patient placed on IV Lasix and diuresed well. Nephrology consulted.   Assessment & Plan:   Principal Problem:   Acute renal failure superimposed on stage 3 chronic kidney disease ([facility]) Active Problems:   Hypertension   Diabetes mellitus without complication ([facility])   Anasarca   Tobacco abuse   Leukocytosis  #1 acute on chronic kidney disease stage III versus progressive chronic kidney disease Patient had presented with progressive edema, hypertension, worsening renal function. Concern for acute on chronic kidney disease versus progressive chronic kidney disease. Patient also noted to be on NSAIDs prior to admission which has subsequently been discontinued. Patient has been seen in consultation by nephrology and patient has been noted to have nephrotic range proteinuria and likely progressive diabetic kidney disease. Nephrology. Patient with good urine output. Patient is -4.750 L over the past 24 hours and -8.369 L during the hospitalization. Renal ultrasound negative for hydronephrosis. Creatinine currently at 4.6 to from 4.47 from 4.53 on admission. Patient has been transitioned to oral Lasix per nephrology. Hepatitis B and hepatitis C negative. C4 is 19. ANA was negative. RPR nonreactive. Continue strict I's and O's. Daily weights. Avoid nephrotoxins. Per nephrology.  #2 hypertension Still somewhat elevated however improving. Continue Norvasc, atenolol, oral Lasix. Follow.  #3 diabetes mellitus Hemoglobin A1c was 5.8. CBGs have ranged from 101-124. Continued decrease  dose of 70/35 units daily. Continue sliding scale insulin.  #4 edema/volume overload/anasarca Likely secondary to progressive chronic kidney disease. Patient with good urine output and is - 8.369 L during this hospitalization. Patient has been transitioned to oral Lasix per nephrology. 2-D echo pending. Follow.  #5 tobacco abuse Tobacco cessation. Nicotine patch.  #6 leukocytosis Patient currently afebrile. No signs or symptoms of infection. Urinalysis is nitrite negative leukocytes negative. WBC trending down. Follow.     DVT prophylaxis: Heparin Code Status: Full Family Communication: Updated patient. No family at bedside. Disposition Plan: Home once renal function has improved and improvement with volume overload and per nephrology.   Consultants:   Nephrology: Dr. Joelyn Oms 02/05/2017  Procedures:   Chest x-ray 02/05/2017  Renal ultrasound 02/05/2017  2-D echo pending 02/07/2017  Antimicrobials:   None    Subjective: Patient laying in bed. Denies shortness of breath. No chest pain. Patient states edema improving. Patient with good urine output.   Objective: Vitals:   02/06/17 1740 02/06/17 2058 02/07/17 0429 02/07/17 1006  BP: (!) 164/89 (!) 151/77 (!) 142/70 (!) 149/78  Pulse: 67 62 63 64  Resp: 18 17 17 16   Temp: 98.5 F (36.9 C) 98.6 F (37 C) 98 F (36.7 C) 97.9 F (36.6 C)  TempSrc: Oral Oral Oral Oral  SpO2: 99% 98% 98% 98%  Weight:  94 kg (207 lb 4.8 oz)    Height:        Intake/Output Summary (Last 24 hours) at 02/07/17 1106 Last data filed at 02/07/17 0900  Gross per 24 hour  Intake             1080 ml  Output             4025 ml  Net            -  2945 ml   Filed Weights   02/05/17 0440 02/05/17 2155 02/06/17 2058  Weight: 101.2 kg (223 lb) 98 kg (216 lb) 94 kg (207 lb 4.8 oz)    Examination:  General exam: Appears calm and comfortable  Respiratory system: Clear to auscultation. Respiratory effort normal. Cardiovascular system: S1 & S2  heard, RRR. No JVD, murmurs, rubs, gallops or clicks. 1-2+ bilateral lower extremity edema. Gastrointestinal system: Abdomen is nondistended, soft and nontender. No organomegaly or masses felt. Normal bowel sounds heard. Central nervous system: Alert and oriented. No focal neurological deficits. Extremities: Symmetric 5 x 5 power. 2+ bilateral lower extremity edema. Skin: No rashes, lesions or ulcers Psychiatry: Judgement and insight appear normal. Mood & affect appropriate.     Data Reviewed: I have personally reviewed following labs and imaging studies  CBC:  Recent Labs Lab 02/04/17 2310 02/05/17 0335 02/06/17 0522 02/07/17 0420  WBC 14.8* 14.7* 12.2* 12.2*  NEUTROABS 6.3  --  4.6  --   HGB 11.8* 11.2* 11.4* 11.3*  HCT 35.5* 34.7* 35.0* 34.7*  MCV 97.0 97.2 96.4 96.4  PLT 262 241 246 099   Basic Metabolic Panel:  Recent Labs Lab 02/04/17 2310 02/05/17 0335 02/06/17 0522 02/07/17 0716  NA 135 138 138 137  K 4.6 4.3 4.1 3.8  CL 109 112* 108 106  CO2 17* 16* 20* 19*  GLUCOSE 154* 78 91 100*  BUN 54* 54* 55* 56*  CREATININE 4.53* 4.47* 4.62* 4.72*  CALCIUM 8.1* 8.1* 8.0* 8.1*  PHOS  --   --  5.5* 5.5*   GFR: Estimated Creatinine Clearance: 20.2 mL/min (A) (by C-G formula based on SCr of 4.72 mg/dL (H)). Liver Function Tests:  Recent Labs Lab 02/04/17 2310 02/06/17 0522 02/07/17 0716  AST 35  --   --   ALT 23  --   --   ALKPHOS 142*  --   --   BILITOT 0.4  --   --   PROT 6.4*  --   --   ALBUMIN 3.2* 2.7* 3.0*   No results for input(s): LIPASE, AMYLASE in the last 168 hours. No results for input(s): AMMONIA in the last 168 hours. Coagulation Profile: No results for input(s): INR, PROTIME in the last 168 hours. Cardiac Enzymes:  Recent Labs Lab 02/04/17 2310 02/05/17 0335 02/05/17 0819 02/05/17 1433  TROPONINI <0.03 <0.03 <0.03 0.03*   BNP (last 3 results) No results for input(s): PROBNP in the last 8760 hours. HbA1C:  Recent Labs   02/05/17 0335  HGBA1C 5.8*   CBG:  Recent Labs Lab 02/06/17 0755 02/06/17 1229 02/06/17 1728 02/06/17 2102 02/07/17 0758  GLUCAP 85 144* 131* 124* 104*   Lipid Profile:  Recent Labs  02/05/17 0335  CHOL 176  HDL 38*  LDLCALC 99  TRIG 197*  CHOLHDL 4.6   Thyroid Function Tests: No results for input(s): TSH, T4TOTAL, FREET4, T3FREE, THYROIDAB in the last 72 hours. Anemia Panel: No results for input(s): VITAMINB12, FOLATE, FERRITIN, TIBC, IRON, RETICCTPCT in the last 72 hours. Sepsis Labs: No results for input(s): PROCALCITON, LATICACIDVEN in the last 168 hours.  No results found for this or any previous visit (from the past 240 hour(s)).       Radiology Studies: No results found.      Scheduled Meds: . amLODipine  10 mg Oral Daily  . atenolol  100 mg Oral Daily  . furosemide  80 mg Oral BID  . heparin subcutaneous  5,000 Units Subcutaneous Q8H  . insulin aspart  0-5 Units Subcutaneous QHS  . insulin aspart  0-9 Units Subcutaneous TID WC  . insulin aspart protamine- aspart  5 Units Subcutaneous QHS  . nicotine  21 mg Transdermal Daily  . sodium chloride flush  3 mL Intravenous Q12H   Continuous Infusions:   LOS: 2 days    Time spent: 35 minutes    Anely Spiewak, MD [facility] Pager [phone]  If 7PM-7AM, please contact night-coverage [email or web address] Password [email or web address] 02/07/2017, 11:06 AM

## 2017-02-07 NOTE — Progress Notes (Signed)
Patient discharged to home. All scripts reviewed, all follow up appt reviewed. Patient understands discharged instructions. Patient left the unit in stable condition.  Sheliah Plane RN

## 2017-03-12 ENCOUNTER — Other Ambulatory Visit: Payer: Self-pay | Admitting: *Deleted

## 2017-03-12 DIAGNOSIS — Z01810 Encounter for preprocedural cardiovascular examination: Secondary | ICD-10-CM

## 2017-04-07 ENCOUNTER — Encounter: Payer: Self-pay | Admitting: Vascular Surgery

## 2017-04-08 ENCOUNTER — Ambulatory Visit (HOSPITAL_COMMUNITY)
Admission: RE | Admit: 2017-04-08 | Discharge: 2017-04-08 | Disposition: A | Discharge status: Home / Self Care | Payer: No Typology Code available for payment source | Source: Ambulatory Visit | Attending: Vascular Surgery | Admitting: Vascular Surgery

## 2017-04-08 ENCOUNTER — Ambulatory Visit (INDEPENDENT_AMBULATORY_CARE_PROVIDER_SITE_OTHER)
Admission: RE | Admit: 2017-04-08 | Discharge: 2017-04-08 | Disposition: A | Discharge status: Home / Self Care | Payer: No Typology Code available for payment source | Source: Ambulatory Visit | Attending: Vascular Surgery | Admitting: Vascular Surgery

## 2017-04-08 DIAGNOSIS — Z01810 Encounter for preprocedural cardiovascular examination: Secondary | ICD-10-CM | POA: Diagnosis not present

## 2017-04-10 ENCOUNTER — Encounter: Payer: Self-pay | Admitting: Vascular Surgery

## 2017-04-17 ENCOUNTER — Ambulatory Visit (INDEPENDENT_AMBULATORY_CARE_PROVIDER_SITE_OTHER): Payer: No Typology Code available for payment source | Admitting: Vascular Surgery

## 2017-04-17 ENCOUNTER — Encounter: Payer: Self-pay | Admitting: Vascular Surgery

## 2017-04-17 VITALS — BP 145/91 | HR 86 | Temp 96.9°F | Resp 16 | Ht 69.0 in | Wt 176.0 lb

## 2017-04-17 DIAGNOSIS — I10 Essential (primary) hypertension: Secondary | ICD-10-CM | POA: Diagnosis not present

## 2017-04-17 DIAGNOSIS — N185 Chronic kidney disease, stage 5: Secondary | ICD-10-CM | POA: Diagnosis not present

## 2017-04-17 NOTE — Progress Notes (Signed)
Vitals:   04/17/17 1326  BP: (!) 152/94  Pulse: 86  Resp: 16  Temp: (!) 96.9 F (36.1 C)  SpO2: 99%  Weight: 176 lb (79.8 kg)  Height: 5\' 9"  (1.753 m)

## 2017-04-17 NOTE — Progress Notes (Signed)
Patient name: Brian [name] MRN: [id number] DOB: [birth year] Sex: male   REASON FOR CONSULT:    To evaluate for AV fistula. The physician requesting the consult was Dr. Pearson Grippe.  HPI:   Brian [name] is a pleasant 56 y.o. male,  who is referred for evaluation for hemodialysis access. He is right-handed. He denies any recent uremic symptoms. Specifically, he denies nausea, vomiting, fatigue, anorexia, or palpitations.  He does have some arthritis in his right wrist and wears a brace. He is right-handed.  His end-stage renal disease secondary to diabetes and hypertension.  I have reviewed the records that were sent from [facility]. The patient has explored all of his options including peroneal dialysis, transplant, and hemodialysis. He is ready to proceed with a fistula and a proactive manner. He had an echo in May 2018 which showed an ejection fraction of 60-65%. His GFR is 11. He has stage V chronic kidney disease in addition to type 2 diabetes, hypertension.  Past Medical History:  Diagnosis Date  . Arthritis   . Diabetes mellitus without complication ([place])   . Hypertension   . Tobacco abuse     Family History  Problem Relation Age of Onset  . Lymphoma Mother   . Hypertension Father   . Diabetes Mellitus II Father   . Hypertension Brother   . Diabetes Mellitus I Brother     SOCIAL HISTORY: Social History   Social History  . Marital status: Widowed    Spouse name: N/A  . Number of children: N/A  . Years of education: N/A   Occupational History  . Not on file.   Social History Main Topics  . Smoking status: Current Every Day Smoker    Packs/day: 1.00    Types: Cigarettes  . Smokeless tobacco: Never Used  . Alcohol use No  . Drug use: No  . Sexual activity: Not on file   Other Topics Concern  . Not on file   Social History Narrative  . No narrative on file    Allergies  Allergen Reactions  . Aspirin Other (See Comments)    Other  Reaction: GI Upset  . Penicillins Hives and Rash    Current Outpatient Prescriptions  Medication Sig Dispense Refill  . amLODipine (NORVASC) 10 MG tablet Take 10 mg by mouth daily.    . furosemide (LASIX) 80 MG tablet Take 1 tablet (80 mg total) by mouth 2 (two) times daily. 60 tablet 2  . Insulin NPH Isophane & Regular (NOVOLIN 70/30 [place]) Inject into the skin.    Marland Kitchen atenolol (TENORMIN) 100 MG tablet Take 100 mg by mouth daily.    Marland Kitchen HYDROcodone-acetaminophen (NORCO/VICODIN) 5-325 MG tablet Take 1-2 tablets by mouth every 6 (six) hours as needed (for pain). (Patient not taking: Reported on 04/17/2017) 20 tablet 0  . insulin aspart protamine- aspart (NOVOLOG MIX 70/30) (70-30) 100 UNIT/ML injection Inject 0.05 mLs (5 Units total) into the skin at bedtime. (Patient not taking: Reported on 04/17/2017) 10 mL 0  . nicotine (NICODERM CQ - DOSED IN MG/24 HOURS) 21 mg/24hr patch Place 1 patch (21 mg total) onto the skin daily. (Patient not taking: Reported on 04/17/2017) 28 patch 0   No current facility-administered medications for this visit.     REVIEW OF SYSTEMS:  [X]  denotes positive finding, [ ]  denotes negative finding Cardiac  Comments:  Chest pain or chest pressure:    Shortness of breath upon exertion:    Short of  breath when lying flat:    Irregular heart rhythm:        Vascular    Pain in calf, thigh, or hip brought on by ambulation:    Pain in feet at night that wakes you up from your sleep:     Blood clot in your veins:    Leg swelling:         Pulmonary    Oxygen at home:    Productive cough:     Wheezing:         Neurologic    Sudden weakness in arms or legs:     Sudden numbness in arms or legs:     Sudden onset of difficulty speaking or slurred speech:    Temporary loss of vision in one eye:     Problems with dizziness:         Gastrointestinal    Blood in stool:     Vomited blood:         Genitourinary    Burning when urinating:     Blood in urine:          Psychiatric    Major depression:         Hematologic    Bleeding problems:    Problems with blood clotting too easily:        Skin    Rashes or ulcers:        Constitutional    Fever or chills:     PHYSICAL EXAM:   Vitals:   04/17/17 1326 04/17/17 1328  BP: (!) 152/94 (!) 145/91  Pulse: 86 86  Resp: 16   Temp: (!) 96.9 F (36.1 C)   SpO2: 99%   Weight: 176 lb (79.8 kg)   Height: 5\' 9"  (1.753 m)     GENERAL: The patient is a well-nourished male, in no acute distress. The vital signs are documented above. CARDIAC: There is a regular rate and rhythm.  VASCULAR: I do not detect carotid bruits. He has brachial and radial pulses bilaterally. He has no significant lower extremity swelling. PULMONARY: There is good air exchange bilaterally without wheezing or rales. ABDOMEN: Soft and non-tender with normal pitched bowel sounds.  MUSCULOSKELETAL: There are no major deformities or cyanosis. NEUROLOGIC: No focal weakness or paresthesias are detected. SKIN: There are no ulcers or rashes noted. PSYCHIATRIC: The patient has a normal affect.  DATA:    VEIN MAP: I have reviewed the vein mapping was done on 04/08/2017.  On the left side a forearm and upper arm cephalic vein looked to be reasonable in size and he appears to be a good candidate for a fistula.  UPPER EXTREMITY ARTERIAL DUPLEX: I have reviewed the upper extremity arterial duplex was done on 04/08/2017.  On the left artery has a triphasic radial and ulnar signal. The brachial artery is 0.41 cm in diameter.  MEDICAL ISSUES:   STAGE V CHRONIC KIDNEY DISEASE: He appears to be a good candidate for a left radiocephalic or brachiocephalic fistula. His GFR is 10 so if he is not a candidate for fistula that I think we would place a graft although this is not likely. I have explained the indications for placement of an AV fistula or AV graft. I've explained that if at all possible we will place an AV fistula.  I have reviewed  the risks of placement of an AV fistula including but not limited to: failure of the fistula to mature, need for subsequent interventions, and thrombosis. In addition  I have reviewed the potential complications of placement of an AV graft. These risks include, but are not limited to, graft thrombosis, graft infection, wound healing problems, bleeding, arm swelling, and steal syndrome. All the patient's questions were answered and they are agreeable to proceed with surgery.  HYPERTENSION: The patient's blood pressure is under reasonable control.  Deitra Mayo [facility] [phone]

## 2017-05-01 ENCOUNTER — Encounter: Payer: Self-pay | Admitting: Family Medicine

## 2017-05-01 ENCOUNTER — Ambulatory Visit (INDEPENDENT_AMBULATORY_CARE_PROVIDER_SITE_OTHER): Payer: No Typology Code available for payment source | Admitting: Family Medicine

## 2017-05-01 VITALS — BP 148/90 | HR 89 | Ht 69.0 in | Wt 177.0 lb

## 2017-05-01 DIAGNOSIS — I619 Nontraumatic intracerebral hemorrhage, unspecified: Secondary | ICD-10-CM | POA: Insufficient documentation

## 2017-05-01 DIAGNOSIS — Z9081 Acquired absence of spleen: Secondary | ICD-10-CM

## 2017-05-01 DIAGNOSIS — R946 Abnormal results of thyroid function studies: Secondary | ICD-10-CM

## 2017-05-01 DIAGNOSIS — M10072 Idiopathic gout, left ankle and foot: Secondary | ICD-10-CM

## 2017-05-01 DIAGNOSIS — E781 Pure hyperglyceridemia: Secondary | ICD-10-CM | POA: Insufficient documentation

## 2017-05-01 DIAGNOSIS — N185 Chronic kidney disease, stage 5: Secondary | ICD-10-CM

## 2017-05-01 DIAGNOSIS — E1042 Type 1 diabetes mellitus with diabetic polyneuropathy: Secondary | ICD-10-CM

## 2017-05-01 DIAGNOSIS — E1022 Type 1 diabetes mellitus with diabetic chronic kidney disease: Secondary | ICD-10-CM

## 2017-05-01 DIAGNOSIS — Z9049 Acquired absence of other specified parts of digestive tract: Secondary | ICD-10-CM

## 2017-05-01 DIAGNOSIS — N183 Chronic kidney disease, stage 3 unspecified: Secondary | ICD-10-CM

## 2017-05-01 DIAGNOSIS — R7989 Other specified abnormal findings of blood chemistry: Secondary | ICD-10-CM

## 2017-05-01 DIAGNOSIS — Z720 Tobacco use: Secondary | ICD-10-CM

## 2017-05-01 DIAGNOSIS — E559 Vitamin D deficiency, unspecified: Secondary | ICD-10-CM

## 2017-05-01 DIAGNOSIS — M109 Gout, unspecified: Secondary | ICD-10-CM | POA: Insufficient documentation

## 2017-05-01 DIAGNOSIS — E1140 Type 2 diabetes mellitus with diabetic neuropathy, unspecified: Secondary | ICD-10-CM | POA: Insufficient documentation

## 2017-05-01 DIAGNOSIS — Z8639 Personal history of other endocrine, nutritional and metabolic disease: Secondary | ICD-10-CM | POA: Insufficient documentation

## 2017-05-01 DIAGNOSIS — E1029 Type 1 diabetes mellitus with other diabetic kidney complication: Secondary | ICD-10-CM | POA: Insufficient documentation

## 2017-05-01 NOTE — Progress Notes (Signed)
New patient office visit note:  Impression and Recommendations:    1. Elevated TSH   2. Type 1 diabetes mellitus with stage 3 chronic kidney disease ([place])   3. Chronic kidney disease, stage V (very severe) ([place])   4. Hemorrhagic stroke [facility])- Jan 2010   5. Tobacco abuse- >er than 55 pack yr hx; currnet smoker   6. H/O hyperlipidemia- sev yrs ago   7. h/o Hypertriglyceridemia- 13000's in past   8. H/O splenectomy   9. Status post cholecystectomy   10. Idiopathic gout of left foot, unspecified chronicity   11. Diabetic polyneuropathy associated with type 1 diabetes mellitus ([place])   12. Vitamin D deficiency      No problem-specific Assessment & Plan notes found for this encounter.   The patient was counseled, risk factors were discussed, anticipatory guidance given.   New Prescriptions   No medications on file     Discontinued Medications   ATENOLOL (TENORMIN) 100 MG TABLET    Take 100 mg by mouth daily.   FUROSEMIDE (LASIX) 80 MG TABLET    Take 1 tablet (80 mg total) by mouth 2 (two) times daily.   HYDROCODONE-ACETAMINOPHEN (NORCO/VICODIN) 5-325 MG TABLET    Take 1-2 tablets by mouth every 6 (six) hours as needed (for pain).   INSULIN NPH ISOPHANE & REGULAR (NOVOLIN 70/30 [place])    Inject into the skin.   NICOTINE (NICODERM CQ - DOSED IN MG/24 HOURS) 21 MG/24HR PATCH    Place 1 patch (21 mg total) onto the skin daily.      Orders Placed This Encounter  Procedures  . CBC with Differential/Platelet  . Comprehensive metabolic panel  . Lipid panel  . TSH  . POCT A1C  . POCT UA - Microalbumin     Gross side effects, risk and benefits, and alternatives of medications discussed with patient.  Patient is aware that all medications have potential side effects and we are unable to predict every side effect or drug-drug interaction that may occur.  Expresses verbal understanding and consents to current therapy plan and treatment regimen.  Return for Fasting blood work  near future- next week if possible; then OV with me  1week later.  Please see AVS handed out to patient at the end of our visit for further patient instructions/ counseling done pertaining to today's office visit.    Note: This document was prepared using Dragon voice recognition software and may include unintentional dictation errors.  ----------------------------------------------------------------------------------------------------------------------    Subjective:    Chief complaint:   Chief Complaint  Patient presents with  . Establish Care     HPI: Brian [name] is a pleasant 56 y.o. male who presents to [place] at [facility] today to review their medical history with me and establish care.   I asked the patient to review their chronic problem list with me to ensure everything was updated and accurate.    All recent office visits with other providers, any medical records that patient brought in etc  - I reviewed today.     Also asked pt to get me medical records from [facility] providers/ specialists that they had seen within the past 3-5 years- if they are in private practice and/or do not work for a [employer], [facility], [place], [place] or [employer] owned practice.  Told them to call their specialists to clarify this if they are not sure.   PCP- Dr Arelia Sneddon. Has been seeing him all his  life and family wants him to switch- brother's and sisters.   H/o  non-compliance --> never really saw Arelia Sneddon. due to patient not having medical insurance that was covered by his practice.   Up until 1 yr ago--> pt w/o med insurance.   Dm- 5-10 units 70/30 at night.    Problem  Type 1 diabetes mellitus with kidney complication (HCC) - on insulin   2001- oral meds for many yrs 6-7, then insulin novolin 70/30.  Last A1c 12 mo ago.  It was 6.4.   Runs usually 7- 8.0 in past.    - Eye exam- last one many yrs ago, foot exam not done regulalry  B-4 bed--> BS running less than 200 2 hr PP.      Hemorrhagic stroke [facility])- Jan 2010  Chronic Kidney Disease, Stage V (Very Severe) (Hcc)   officially dx in May 2018-  legs swelled up and Dr Joelyn Oms started seeing pt in hosp and now as outpt.   --> crt was 5.0 when he left hosp.  He is giving pt his lasix and monitoring him from renal standpoint- every couple months.  Sept- next appt;  Getting [place] Aug 10th in prep for HD GFR- 8.0; most recetn OV- 10.0.     H/O hyperlipidemia- sev yrs ago   His prior PCP Dr. Arelia Sneddon had had him on a cholesterol medicine, patient states he took him off it and not sure why.  No meds for many years   h/o Hypertriglyceridemia- 13000's in past  Diabetic neuropathy ([place])- 6 mo or so; never started meds   Failed elavil in recent past and then was lost to f/up.    Anasarca- may 2018 hosp  Hypertension  Tobacco abuse- >er than 55 pack yr hx; current smoker   Started when he was in his teens in high school, on average smoked one to 2 packs per day for the past 38 years or so.   H/O Splenectomy   Pancreatic pseudocyst removed in 2000 or so--> which caused DM.  Spleen removed intially as well.    Status Post Cholecystectomy  Gout- L foot- mult times  Vitamin D Deficiency   Vitamin D level was 16 in June 2018-    Leukocytosis- s/p plenectomy  Diabetes Mellitus Without Complication (Hcc)      Wt Readings from Last 3 Encounters:  05/01/17 177 lb (80.3 kg)  04/17/17 176 lb (79.8 kg)  02/06/17 207 lb 4.8 oz (94 kg)   BP Readings from Last 3 Encounters:  05/01/17 (!) 148/90  04/17/17 (!) 145/91  02/07/17 (!) 147/83   Pulse Readings from Last 3 Encounters:  05/01/17 89  04/17/17 86  02/07/17 61   BMI Readings from Last 3 Encounters:  05/01/17 26.14 kg/m  04/17/17 25.99 kg/m  02/06/17 30.61 kg/m    Patient Care Team    Relationship Specialty Notifications Start End  [name], MD PCP - General   09/11/10   Rexene Agent, MD Attending Physician Nephrology  05/01/17      Patient Active Problem List   Diagnosis Date Noted  . Type 1 diabetes mellitus with kidney complication (HCC) - on insulin 05/01/2017    Priority: High  . Hemorrhagic stroke [facility])- Jan 2010 05/01/2017    Priority: High  . Chronic kidney disease, stage V (very severe) ([place]) 02/05/2017    Priority: High  . H/O hyperlipidemia- sev yrs ago 05/01/2017    Priority: Medium  . h/o Hypertriglyceridemia- 13000's in past 05/01/2017  Priority: Medium  . Diabetic neuropathy ([place])- 6 mo or so; never started meds 05/01/2017    Priority: Medium  . Anasarca- may 2018 hosp 02/05/2017    Priority: Medium  . Hypertension     Priority: Medium  . Tobacco abuse- >er than 55 pack yr hx; current smoker     Priority: Medium  . H/O splenectomy 05/01/2017    Priority: Low  . Status post cholecystectomy 05/01/2017    Priority: Low  . Gout- L foot- mult times 05/01/2017    Priority: Low  . Vitamin D deficiency 05/01/2017    Priority: Low  . Leukocytosis- s/p plenectomy 02/05/2017    Priority: Low  . Diabetes mellitus without complication [facility])      Past Medical History:  Diagnosis Date  . Arthritis   . Diabetes mellitus without complication ([place])   . Hypertension   . Tobacco abuse      Past Medical History:  Diagnosis Date  . Arthritis   . Diabetes mellitus without complication ([place])   . Hypertension   . Tobacco abuse      Past Surgical History:  Procedure Laterality Date  . PANCREAS SURGERY    . SPLENECTOMY       Family History  Problem Relation Age of Onset  . Lymphoma Mother   . Hypertension Father   . Diabetes Mellitus II Father   . Hypertension Brother   . Diabetes Mellitus I Brother      History  Drug Use No     History  Alcohol Use No     History  Smoking Status  . Current Every Day Smoker  . Packs/day: 1.00  . Types: Cigarettes  Smokeless Tobacco  . Never Used     Outpatient Encounter Prescriptions as of 05/01/2017  Medication Sig  .  acetaminophen (TYLENOL) 650 MG CR tablet Take 1,300 mg by mouth every 8 (eight) hours as needed for pain.  Marland Kitchen amLODipine (NORVASC) 10 MG tablet Take 10 mg by mouth daily.  . cholecalciferol (VITAMIN D) 1000 units tablet Take 1,000 Units by mouth daily.  . insulin aspart protamine- aspart (NOVOLOG MIX 70/30) (70-30) 100 UNIT/ML injection Inject 0.05 mLs (5 Units total) into the skin at bedtime.  . [DISCONTINUED] atenolol (TENORMIN) 100 MG tablet Take 100 mg by mouth daily.  . [DISCONTINUED] furosemide (LASIX) 80 MG tablet Take 1 tablet (80 mg total) by mouth 2 (two) times daily.  . [DISCONTINUED] HYDROcodone-acetaminophen (NORCO/VICODIN) 5-325 MG tablet Take 1-2 tablets by mouth every 6 (six) hours as needed (for pain). (Patient not taking: Reported on 04/17/2017)  . [DISCONTINUED] Insulin NPH Isophane & Regular (NOVOLIN 70/30 [place]) Inject into the skin.  . [DISCONTINUED] nicotine (NICODERM CQ - DOSED IN MG/24 HOURS) 21 mg/24hr patch Place 1 patch (21 mg total) onto the skin daily. (Patient not taking: Reported on 04/17/2017)   No facility-administered encounter medications on file as of 05/01/2017.     Allergies: Aspirin and Penicillins   ROS   Objective:   Blood pressure (!) 148/90, pulse 89, height _0  (1.753 m), weight 177 lb (80.3 kg). Body mass index is 26.14 kg/m. General: Well Developed, well nourished, and in no acute distress.  Neuro: Alert and oriented x3, extra-ocular muscles intact, sensation grossly intact.  HEENT:[place]/AT, PERRLA, neck supple, No carotid bruits Skin: no gross rashes  Cardiac: Regular rate and rhythm Respiratory: Essentially clear to auscultation bilaterally. Not using accessory muscles, speaking in full sentences.  Abdominal: not grossly distended Musculoskeletal: Ambulates w/o diff, FROM *  4 ext.  Vasc: less 2 sec cap RF, warm and pink  Psych:  No HI/SI, judgement and insight good, Euthymic mood. Full Affect.    Recent Results (from the past 2160 hour(s))   Urinalysis, Routine w reflex microscopic     Status: Abnormal   Collection Time: 02/04/17 11:08 PM  Result Value Ref Range   Color, Urine STRAW (A) YELLOW   APPearance CLEAR CLEAR   Specific Gravity, Urine 1.013 1.005 - 1.030   pH 6.0 5.0 - 8.0   Glucose, UA 150 (A) NEGATIVE mg/dL   Hgb urine dipstick SMALL (A) NEGATIVE   Bilirubin Urine NEGATIVE NEGATIVE   Ketones, ur NEGATIVE NEGATIVE mg/dL   Protein, ur 100 (A) NEGATIVE mg/dL   Nitrite NEGATIVE NEGATIVE   Leukocytes, UA NEGATIVE NEGATIVE   RBC / HPF 0-5 0 - 5 RBC/hpf   WBC, UA 0-5 0 - 5 WBC/hpf   Bacteria, UA NONE SEEN NONE SEEN   Squamous Epithelial / LPF NONE SEEN NONE SEEN  Creatinine, urine, random     Status: None   Collection Time: 02/04/17 11:08 PM  Result Value Ref Range   Creatinine, Urine 52.09 mg/dL  Urea nitrogen, urine     Status: None   Collection Time: 02/04/17 11:08 PM  Result Value Ref Range   Urea Nitrogen, Ur 391 Not Estab. mg/dL    Comment: (NOTE) Performed At: [facility] [place], [place] [id number] Lindon Romp MD TO:[id number]   Rapid urine drug screen (hospital performed)     Status: None   Collection Time: 02/04/17 11:08 PM  Result Value Ref Range   Opiates NONE DETECTED NONE DETECTED   Cocaine NONE DETECTED NONE DETECTED   Benzodiazepines NONE DETECTED NONE DETECTED   Amphetamines NONE DETECTED NONE DETECTED   Tetrahydrocannabinol NONE DETECTED NONE DETECTED   Barbiturates NONE DETECTED NONE DETECTED    Comment:        DRUG SCREEN FOR MEDICAL PURPOSES ONLY.  IF CONFIRMATION IS NEEDED FOR ANY PURPOSE, NOTIFY LAB WITHIN 5 DAYS.        LOWEST DETECTABLE LIMITS FOR URINE DRUG SCREEN Drug Class       Cutoff (ng/mL) Amphetamine      1000 Barbiturate      200 Benzodiazepine   833 Tricyclics       825 Opiates          300 Cocaine          300 THC              50   CBC with Differential     Status: Abnormal   Collection Time: 02/04/17 11:10 PM  Result Value  Ref Range   WBC 14.8 (H) 4.0 - 10.5 K/uL   RBC 3.66 (L) 4.22 - 5.81 MIL/uL   Hemoglobin 11.8 (L) 13.0 - 17.0 g/dL   HCT 35.5 (L) 39.0 - 52.0 %   MCV 97.0 78.0 - 100.0 fL   MCH 32.2 26.0 - 34.0 pg   MCHC 33.2 30.0 - 36.0 g/dL   RDW 13.3 11.5 - 15.5 %   Platelets 262 150 - 400 K/uL   Neutrophils Relative % 43 %   Neutro Abs 6.3 1.7 - 7.7 K/uL   Lymphocytes Relative 39 %   Lymphs Abs 5.8 (H) 0.7 - 4.0 K/uL   Monocytes Relative 14 %   Monocytes Absolute 2.1 (H) 0.1 - 1.0 K/uL   Eosinophils Relative 3 %   Eosinophils Absolute 0.5 0.0 - 0.7  K/uL   Basophils Relative 1 %   Basophils Absolute 0.1 0.0 - 0.1 K/uL  Comprehensive metabolic panel     Status: Abnormal   Collection Time: 02/04/17 11:10 PM  Result Value Ref Range   Sodium 135 135 - 145 mmol/L   Potassium 4.6 3.5 - 5.1 mmol/L   Chloride 109 101 - 111 mmol/L   CO2 17 (L) 22 - 32 mmol/L   Glucose, Bld 154 (H) 65 - 99 mg/dL   BUN 54 (H) 6 - 20 mg/dL   Creatinine, Ser 4.53 (H) 0.61 - 1.24 mg/dL   Calcium 8.1 (L) 8.9 - 10.3 mg/dL   Total Protein 6.4 (L) 6.5 - 8.1 g/dL   Albumin 3.2 (L) 3.5 - 5.0 g/dL   AST 35 15 - 41 U/L   ALT 23 17 - 63 U/L   Alkaline Phosphatase 142 (H) 38 - 126 U/L   Total Bilirubin 0.4 0.3 - 1.2 mg/dL   GFR calc non Af Amer 13 (L) >60 mL/min   GFR calc Af Amer 16 (L) >60 mL/min    Comment: (NOTE) The eGFR has been calculated using the CKD EPI equation. This calculation has not been validated in all clinical situations. eGFR's persistently <60 mL/min signify possible Chronic Kidney Disease.    Anion gap 9 5 - 15  Brain natriuretic peptide     Status: Abnormal   Collection Time: 02/04/17 11:10 PM  Result Value Ref Range   B Natriuretic Peptide 800.3 (H) 0.0 - 100.0 pg/mL  Troponin I     Status: None   Collection Time: 02/04/17 11:10 PM  Result Value Ref Range   Troponin I <0.03 <0.03 ng/mL  Hemoglobin A1c     Status: Abnormal   Collection Time: 02/05/17  3:35 AM  Result Value Ref Range   Hgb A1c  MFr Bld 5.8 (H) 4.8 - 5.6 %    Comment: (NOTE)         Pre-diabetes: 5.7 - 6.4         Diabetes: >6.4         Glycemic control for adults with diabetes: <7.0    Mean Plasma Glucose 120 mg/dL    Comment: (NOTE) Performed At: [facility] [place], [place] [id number] Lindon Romp MD EY:[id number]   Lipid panel     Status: Abnormal   Collection Time: 02/05/17  3:35 AM  Result Value Ref Range   Cholesterol 176 0 - 200 mg/dL   Triglycerides 197 (H) <150 mg/dL   HDL 38 (L) >40 mg/dL   Total CHOL/HDL Ratio 4.6 RATIO   VLDL 39 0 - 40 mg/dL   LDL Cholesterol 99 0 - 99 mg/dL    Comment:        Total Cholesterol/HDL:CHD Risk Coronary Heart Disease Risk Table                     Men   Women  1/2 Average Risk   3.4   3.3  Average Risk       5.0   4.4  2 X Average Risk   9.6   7.1  3 X Average Risk  23.4   11.0        Use the calculated Patient Ratio above and the CHD Risk Table to determine the patient's CHD Risk.        ATP III CLASSIFICATION (LDL):  <100     mg/dL   Optimal  100-129  mg/dL  Near or Above                    Optimal  130-159  mg/dL   Borderline  160-189  mg/dL   High  >190     mg/dL   Very High   HIV antibody (Routine Testing)     Status: None   Collection Time: 02/05/17  3:35 AM  Result Value Ref Range   HIV Screen 4th Generation wRfx Non Reactive Non Reactive    Comment: (NOTE) Performed At: [facility] [name], [place] [id number] Lindon Romp MD [id number]   Troponin I     Status: None   Collection Time: 02/05/17  3:35 AM  Result Value Ref Range   Troponin I <0.03 <0.03 ng/mL  Basic metabolic panel     Status: Abnormal   Collection Time: 02/05/17  3:35 AM  Result Value Ref Range   Sodium 138 135 - 145 mmol/L   Potassium 4.3 3.5 - 5.1 mmol/L   Chloride 112 (H) 101 - 111 mmol/L   CO2 16 (L) 22 - 32 mmol/L   Glucose, Bld 78 65 - 99 mg/dL   BUN 54 (H) 6 - 20 mg/dL   Creatinine, Ser 4.47 (H)  0.61 - 1.24 mg/dL   Calcium 8.1 (L) 8.9 - 10.3 mg/dL   GFR calc non Af Amer 14 (L) >60 mL/min   GFR calc Af Amer 16 (L) >60 mL/min    Comment: (NOTE) The eGFR has been calculated using the CKD EPI equation. This calculation has not been validated in all clinical situations. eGFR's persistently <60 mL/min signify possible Chronic Kidney Disease.    Anion gap 10 5 - 15  CBC     Status: Abnormal   Collection Time: 02/05/17  3:35 AM  Result Value Ref Range   WBC 14.7 (H) 4.0 - 10.5 K/uL   RBC 3.57 (L) 4.22 - 5.81 MIL/uL   Hemoglobin 11.2 (L) 13.0 - 17.0 g/dL   HCT 34.7 (L) 39.0 - 52.0 %   MCV 97.2 78.0 - 100.0 fL   MCH 31.4 26.0 - 34.0 pg   MCHC 32.3 30.0 - 36.0 g/dL   RDW 13.4 11.5 - 15.5 %   Platelets 241 150 - 400 K/uL  Troponin I     Status: None   Collection Time: 02/05/17  8:19 AM  Result Value Ref Range   Troponin I <0.03 <0.03 ng/mL  Glucose, capillary     Status: None   Collection Time: 02/05/17  9:01 AM  Result Value Ref Range   Glucose-Capillary 87 65 - 99 mg/dL  Glucose, capillary     Status: None   Collection Time: 02/05/17 11:32 AM  Result Value Ref Range   Glucose-Capillary 82 65 - 99 mg/dL  Troponin I     Status: Abnormal   Collection Time: 02/05/17  2:33 PM  Result Value Ref Range   Troponin I 0.03 (HH) <0.03 ng/mL    Comment: CRITICAL RESULT CALLED TO, READ BACK BY AND VERIFIED WITH: ABBY AO RN [id number] GREEN R   Protein electrophoresis, serum     Status: None   Collection Time: 02/05/17  4:27 PM  Result Value Ref Range   Total Protein ELP 6.0 6.0 - 8.5 g/dL   Albumin ELP 3.1 2.9 - 4.4 g/dL   Alpha-1-Globulin 0.2 0.0 - 0.4 g/dL   Alpha-2-Globulin 0.8 0.4 - 1.0 g/dL   Beta Globulin 0.9 0.7 - 1.3 g/dL  Gamma Globulin 1.0 0.4 - 1.8 g/dL   M-Spike, % Not Observed Not Observed g/dL   SPE Interp. Comment     Comment: (NOTE) The SPE pattern appears essentially unremarkable. Evidence of monoclonal protein is not apparent. Performed At: [facility] [place], [place] [id number] Lindon Romp MD EK:[id number]    Comment Comment     Comment: (NOTE) Protein electrophoresis scan will follow via computer, mail, or courier delivery.    GLOBULIN, TOTAL 2.9 2.2 - 3.9 g/dL   A/G Ratio 1.1 0.7 - 1.7  Kappa/lambda light chains     Status: Abnormal   Collection Time: 02/05/17  4:27 PM  Result Value Ref Range   Kappa free light chain 94.9 (H) 3.3 - 19.4 mg/L   Lamda free light chains 65.9 (H) 5.7 - 26.3 mg/L   Kappa, lamda light chain ratio 1.44 0.26 - 1.65    Comment: (NOTE) Performed At: [facility] [place], [place] [id number] Lindon Romp MD [id number]   Glucose, capillary     Status: None   Collection Time: 02/05/17  4:34 PM  Result Value Ref Range   Glucose-Capillary 81 65 - 99 mg/dL  Glucose, capillary     Status: None   Collection Time: 02/05/17  9:57 PM  Result Value Ref Range   Glucose-Capillary 92 65 - 99 mg/dL  Protein / creatinine ratio, urine     Status: Abnormal   Collection Time: 02/05/17 10:19 PM  Result Value Ref Range   Creatinine, Urine 21.40 mg/dL   Total Protein, Urine 132 mg/dL    Comment: NO NORMAL RANGE ESTABLISHED FOR THIS TEST   Protein Creatinine Ratio 6.17 (H) 0.00 - 0.15 mg/mg[Cre]  Renal function panel     Status: Abnormal   Collection Time: 02/06/17  5:22 AM  Result Value Ref Range   Sodium 138 135 - 145 mmol/L   Potassium 4.1 3.5 - 5.1 mmol/L   Chloride 108 101 - 111 mmol/L   CO2 20 (L) 22 - 32 mmol/L   Glucose, Bld 91 65 - 99 mg/dL   BUN 55 (H) 6 - 20 mg/dL   Creatinine, Ser 4.62 (H) 0.61 - 1.24 mg/dL   Calcium 8.0 (L) 8.9 - 10.3 mg/dL   Phosphorus 5.5 (H) 2.5 - 4.6 mg/dL   Albumin 2.7 (L) 3.5 - 5.0 g/dL   GFR calc non Af Amer 13 (L) >60 mL/min   GFR calc Af Amer 15 (L) >60 mL/min    Comment: (NOTE) The eGFR has been calculated using the CKD EPI equation. This calculation has not been validated in all clinical situations. eGFR's  persistently <60 mL/min signify possible Chronic Kidney Disease.    Anion gap 10 5 - 15  CBC with Differential/Platelet     Status: Abnormal   Collection Time: 02/06/17  5:22 AM  Result Value Ref Range   WBC 12.2 (H) 4.0 - 10.5 K/uL   RBC 3.63 (L) 4.22 - 5.81 MIL/uL   Hemoglobin 11.4 (L) 13.0 - 17.0 g/dL   HCT 35.0 (L) 39.0 - 52.0 %   MCV 96.4 78.0 - 100.0 fL   MCH 31.4 26.0 - 34.0 pg   MCHC 32.6 30.0 - 36.0 g/dL   RDW 13.3 11.5 - 15.5 %   Platelets 246 150 - 400 K/uL   Neutrophils Relative % 37 %   Neutro Abs 4.6 1.7 - 7.7 K/uL   Lymphocytes Relative 42 %   Lymphs Abs 5.1 (H) 0.7 -  4.0 K/uL   Monocytes Relative 15 %   Monocytes Absolute 1.9 (H) 0.1 - 1.0 K/uL   Eosinophils Relative 5 %   Eosinophils Absolute 0.6 0.0 - 0.7 K/uL   Basophils Relative 1 %   Basophils Absolute 0.1 0.0 - 0.1 K/uL  Glucose, capillary     Status: None   Collection Time: 02/06/17  7:55 AM  Result Value Ref Range   Glucose-Capillary 85 65 - 99 mg/dL  Hepatitis C antibody     Status: None   Collection Time: 02/06/17 11:39 AM  Result Value Ref Range   HCV Ab <0.1 0.0 - 0.9 s/co ratio    Comment: (NOTE)                                  Negative:     < 0.8                             Indeterminate: 0.8 - 0.9                                  Positive:     > 0.9 The CDC recommends that a positive HCV antibody result be followed up with a HCV Nucleic Acid Amplification test (409735). Performed At: [facility] [place], [place] [id number] Lindon Romp MD [id number]   Hepatitis B surface antigen     Status: None   Collection Time: 02/06/17 11:39 AM  Result Value Ref Range   Hepatitis B Surface Ag Negative Negative    Comment: (NOTE) Performed At: [facility] [place], [place] [id number] Lindon Romp MD [id number]   C3 complement     Status: None   Collection Time: 02/06/17 11:39 AM  Result Value Ref Range   C3 Complement 121 82 - 167  mg/dL    Comment: (NOTE) Performed At: [facility] [address] [place], [place] [id number] Lindon Romp MD [id number]   C4 complement     Status: None   Collection Time: 02/06/17 11:39 AM  Result Value Ref Range   Complement C4, Body Fluid 19 14 - 44 mg/dL    Comment: (NOTE) Performed At: [facility] [address] [place], [place] [id number] Lindon Romp MD [id number]   Antinuclear Antibodies, IFA     Status: None   Collection Time: 02/06/17 11:39 AM  Result Value Ref Range   ANA Ab, IFA Negative     Comment: (NOTE)                                     Negative   <1:80                                     Borderline  1:80                                     Positive   >1:80 Performed At: [facility] [address] [place], [place] [id number] Lindon Romp MD [id number]   RPR  Status: None   Collection Time: 02/06/17 11:39 AM  Result Value Ref Range   RPR Ser Ql Non Reactive Non Reactive    Comment: (NOTE) Performed At: [facility] [place], [place] [id number] Lindon Romp MD [id number]   Glucose, capillary     Status: Abnormal   Collection Time: 02/06/17 12:29 PM  Result Value Ref Range   Glucose-Capillary 144 (H) 65 - 99 mg/dL  Glucose, capillary     Status: Abnormal   Collection Time: 02/06/17  5:28 PM  Result Value Ref Range   Glucose-Capillary 131 (H) 65 - 99 mg/dL  Glucose, capillary     Status: Abnormal   Collection Time: 02/06/17  9:02 PM  Result Value Ref Range   Glucose-Capillary 124 (H) 65 - 99 mg/dL  CBC     Status: Abnormal   Collection Time: 02/07/17  4:20 AM  Result Value Ref Range   WBC 12.2 (H) 4.0 - 10.5 K/uL   RBC 3.60 (L) 4.22 - 5.81 MIL/uL   Hemoglobin 11.3 (L) 13.0 - 17.0 g/dL   HCT 34.7 (L) 39.0 - 52.0 %   MCV 96.4 78.0 - 100.0 fL   MCH 31.4 26.0 - 34.0 pg   MCHC 32.6 30.0 - 36.0 g/dL   RDW 12.9 11.5 - 15.5 %   Platelets 222 150 - 400 K/uL  Renal function  panel     Status: Abnormal   Collection Time: 02/07/17  7:16 AM  Result Value Ref Range   Sodium 137 135 - 145 mmol/L   Potassium 3.8 3.5 - 5.1 mmol/L   Chloride 106 101 - 111 mmol/L   CO2 19 (L) 22 - 32 mmol/L   Glucose, Bld 100 (H) 65 - 99 mg/dL   BUN 56 (H) 6 - 20 mg/dL   Creatinine, Ser 4.72 (H) 0.61 - 1.24 mg/dL   Calcium 8.1 (L) 8.9 - 10.3 mg/dL   Phosphorus 5.5 (H) 2.5 - 4.6 mg/dL   Albumin 3.0 (L) 3.5 - 5.0 g/dL   GFR calc non Af Amer 13 (L) >60 mL/min   GFR calc Af Amer 15 (L) >60 mL/min    Comment: (NOTE) The eGFR has been calculated using the CKD EPI equation. This calculation has not been validated in all clinical situations. eGFR's persistently <60 mL/min signify possible Chronic Kidney Disease.    Anion gap 12 5 - 15  Glucose, capillary     Status: Abnormal   Collection Time: 02/07/17  7:58 AM  Result Value Ref Range   Glucose-Capillary 104 (H) 65 - 99 mg/dL  ECHOCARDIOGRAM COMPLETE     Status: None   Collection Time: 02/07/17 12:19 PM  Result Value Ref Range   Weight 3,316.8 oz   Height 69 in   BP 149/78 mmHg  Glucose, capillary     Status: Abnormal   Collection Time: 02/07/17 12:21 PM  Result Value Ref Range   Glucose-Capillary 101 (H) 65 - 99 mg/dL  Glucose, capillary     Status: Abnormal   Collection Time: 02/07/17  5:30 PM  Result Value Ref Range   Glucose-Capillary 142 (H) 65 - 99 mg/dL

## 2017-05-01 NOTE — Patient Instructions (Addendum)
Patient will call his insurance company and find out how much the labs will be.    Please give me your results from your renal doctor that you recently did, as he may be able to cancel some of them.  - In the near future obtain blood work, fasting, then follow-up with me to discuss and entertain going on additional blood pressure medicines, cholesterol medicines etc.    Managing Your Hypertension Hypertension is commonly called high blood pressure. This is when the force of your blood pressing against the walls of your arteries is too strong. Arteries are blood vessels that carry blood from your heart throughout your body. Hypertension forces the heart to work harder to pump blood, and may cause the arteries to become narrow or stiff. Having untreated or uncontrolled hypertension can cause heart attack, stroke, kidney disease, and other problems. What are blood pressure readings? A blood pressure reading consists of a higher number over a lower number. Ideally, your blood pressure should be below 120/80. The first ("top") number is called the systolic pressure. It is a measure of the pressure in your arteries as your heart beats. The second ("bottom") number is called the diastolic pressure. It is a measure of the pressure in your arteries as the heart relaxes. What does my blood pressure reading mean? Blood pressure is classified into four stages. Based on your blood pressure reading, your health care provider may use the following stages to determine what type of treatment you need, if any. Systolic pressure and diastolic pressure are measured in a unit called mm Hg. Normal  Systolic pressure: below 774.  Diastolic pressure: below 80. Elevated  Systolic pressure: 128-786.  Diastolic pressure: below 80. Hypertension stage 1  Systolic pressure: 767-209.  Diastolic pressure: 47-09. Hypertension stage 2  Systolic pressure: 628 or above.  Diastolic pressure: 90 or above. What health  risks are associated with hypertension? Managing your hypertension is an important responsibility. Uncontrolled hypertension can lead to:  A heart attack.  A stroke.  A weakened blood vessel (aneurysm).  Heart failure.  Kidney damage.  Eye damage.  Metabolic syndrome.  Memory and concentration problems.  What changes can I make to manage my hypertension? Hypertension can be managed by making lifestyle changes and possibly by taking medicines. Your health care provider will help you make a plan to bring your blood pressure within a normal range. Eating and drinking  Eat a diet that is high in fiber and potassium, and low in salt (sodium), added sugar, and fat. An example eating plan is called the DASH (Dietary Approaches to Stop Hypertension) diet. To eat this way: ? Eat plenty of fresh fruits and vegetables. Try to fill half of your plate at each meal with fruits and vegetables. ? Eat whole grains, such as whole wheat pasta, brown rice, or whole grain bread. Fill about one quarter of your plate with whole grains. ? Eat low-fat diary products. ? Avoid fatty cuts of meat, processed or cured meats, and poultry with skin. Fill about one quarter of your plate with lean proteins such as fish, chicken without skin, beans, eggs, and tofu. ? Avoid premade and processed foods. These tend to be higher in sodium, added sugar, and fat.  Reduce your daily sodium intake. Most people with hypertension should eat less than 1,500 mg of sodium a day.  Limit alcohol intake to no more than 1 drink a day for nonpregnant women and 2 drinks a day for men. One drink equals 12  oz of beer, 5 oz of wine, or 1 oz of hard liquor. Lifestyle  Work with your health care provider to maintain a healthy body weight, or to lose weight. Ask what an ideal weight is for you.  Get at least 30 minutes of exercise that causes your heart to beat faster (aerobic exercise) most days of the week. Activities may include  walking, swimming, or biking.  Include exercise to strengthen your muscles (resistance exercise), such as weight lifting, as part of your weekly exercise routine. Try to do these types of exercises for 30 minutes at least 3 days a week.  Do not use any products that contain nicotine or tobacco, such as cigarettes and e-cigarettes. If you need help quitting, ask your health care provider.  Control any long-term (chronic) conditions you have, such as high cholesterol or diabetes. Monitoring  Monitor your blood pressure at home as told by your health care provider. Your personal target blood pressure may vary depending on your medical conditions, your age, and other factors.  Have your blood pressure checked regularly, as often as told by your health care provider. Working with your health care provider  Review all the medicines you take with your health care provider because there may be side effects or interactions.  Talk with your health care provider about your diet, exercise habits, and other lifestyle factors that may be contributing to hypertension.  Visit your health care provider regularly. Your health care provider can help you create and adjust your plan for managing hypertension. Will I need medicine to control my blood pressure? Your health care provider may prescribe medicine if lifestyle changes are not enough to get your blood pressure under control, and if:  Your systolic blood pressure is 130 or higher.  Your diastolic blood pressure is 80 or higher.  Take medicines only as told by your health care provider. Follow the directions carefully. Blood pressure medicines must be taken as prescribed. The medicine does not work as well when you skip doses. Skipping doses also puts you at risk for problems. Contact a health care provider if:  You think you are having a reaction to medicines you have taken.  You have repeated (recurrent) headaches.  You feel dizzy.  You have  swelling in your ankles.  You have trouble with your vision. Get help right away if:  You develop a severe headache or confusion.  You have unusual weakness or numbness, or you feel faint.  You have severe pain in your chest or abdomen.  You vomit repeatedly.  You have trouble breathing. Summary  Hypertension is when the force of blood pumping through your arteries is too strong. If this condition is not controlled, it may put you at risk for serious complications.  Your personal target blood pressure may vary depending on your medical conditions, your age, and other factors. For most people, a normal blood pressure is less than 120/80.  Hypertension is managed by lifestyle changes, medicines, or both. Lifestyle changes include weight loss, eating a healthy, low-sodium diet, exercising more, and limiting alcohol. This information is not intended to replace advice given to you by your health care provider. Make sure you discuss any questions you have with your health care provider. Document Released: 06/17/2012 Document Revised: 08/21/2016 Document Reviewed: 08/21/2016 Elsevier Interactive Patient Education  2018 [employer].   Diabetic Neuropathy Diabetic neuropathy is a nerve disease or nerve damage that is caused by diabetes mellitus. About half of all people with diabetes mellitus  have some form of nerve damage. Nerve damage is more common in those who have had diabetes mellitus for many years and who generally have not had good control of their blood sugar (glucose) level. Diabetic neuropathy is a common complication of diabetes mellitus. There are three common types of diabetic neuropathy and a fourth type that is less common and less understood:  Peripheral neuropathy-This is the most common type of diabetic neuropathy. It causes damage to the nerves of the feet and legs first and then eventually the hands and arms. The damage affects the ability to sense touch.  Autonomic  neuropathy-This type causes damage to the autonomic nervous system, which controls the following functions: ? Heartbeat. ? Body temperature. ? Blood pressure. ? Urination. ? Digestion. ? Sweating. ? Sexual function.  Focal neuropathy-Focal neuropathy can be painful and unpredictable and occurs most often in older adults with diabetes mellitus. It involves a specific nerve or one area and often comes on suddenly. It usually does not cause long-term problems.  Radiculoplexus neuropathy- Sometimes called lumbosacral radiculoplexus neuropathy, radiculoplexus neuropathy affects the nerves of the thighs, hips, buttocks, or legs. It is more common in people with type 2 diabetes mellitus and in older men. It is characterized by debilitating pain, weakness, and atrophy, usually in the thigh muscles.  What are the causes? The cause of peripheral, autonomic, and focal neuropathies is diabetes mellitus that is uncontrolled and high glucose levels. The cause of radiculoplexus neuropathy is unknown. However, it is thought to be caused by inflammation related to uncontrolled glucose levels. What are the signs or symptoms? Peripheral Neuropathy Peripheral neuropathy develops slowly over time. When the nerves of the feet and legs no longer work there may be:  Burning, stabbing, or aching pain in the legs or feet.  Inability to feel pressure or pain in your feet. This can lead to: ? Thick calluses over pressure areas. ? Pressure sores. ? Ulcers.  Foot deformities.  Reduced ability to feel temperature changes.  Muscle weakness.  Autonomic Neuropathy The symptoms of autonomic neuropathy vary depending on which nerves are affected. Symptoms may include:  Problems with digestion, such as: ? Feeling sick to your stomach (nausea). ? Vomiting. ? Bloating. ? Constipation. ? Diarrhea. ? Abdominal pain.  Difficulty with urination. This occurs if you lose your ability to sense when your bladder is  full. Problems include: ? Urine leakage (incontinence). ? Inability to empty your bladder completely (retention).  Rapid or irregular heartbeat (palpitations).  Blood pressure drops when you stand up (orthostatic hypotension). When you stand up you may feel: ? Dizzy. ? Weak. ? Faint.  In men, inability to attain and maintain an erection.  In women, vaginal dryness and problems with decreased sexual desire and arousal.  Problems with body temperature regulation.  Increased or decreased sweating.  Focal Neuropathy  Abnormal eye movements or abnormal alignment of both eyes.  Weakness in the wrist.  Foot drop. This results in an inability to lift the foot properly and abnormal walking or foot movement.  Paralysis on one side of your face (Bell palsy).  Chest or abdominal pain. Radiculoplexus Neuropathy  Sudden, severe pain in your hip, thigh, or buttocks.  Weakness and wasting of thigh muscles.  Difficulty rising from a seated position.  Abdominal swelling.  Unexplained weight loss (usually more than 10 lb [4.5 kg]). How is this diagnosed? Peripheral Neuropathy Your senses may be tested. Sensory function testing can be done with:  A light touch using a monofilament.  A vibration with tuning fork.  A sharp sensation with a pin prick.  Other tests that can help diagnose neuropathy are:  Nerve conduction velocity. This test checks the transmission of an electrical current through a nerve.  Electromyography. This shows how muscles respond to electrical signals transmitted by nearby nerves.  Quantitative sensory testing. This is used to assess how your nerves respond to vibrations and changes in temperature.  Autonomic Neuropathy Diagnosis is often based on reported symptoms. Tell your health care provider if you experience:  Dizziness.  Constipation.  Diarrhea.  Inappropriate urination or inability to urinate.  Inability to get or maintain an  erection.  Tests that may be done include:  Electrocardiography or Holter monitor. These are tests that can help show problems with the heart rate or heart rhythm.  An X-ray exam may be done.  Focal Neuropathy Diagnosis is made based on your symptoms and what your health care provider finds during your exam. Other tests may be done. They may include:  Nerve conduction velocities. This checks the transmission of electrical current through a nerve.  Electromyography. This shows how muscles respond to electrical signals transmitted by nearby nerves.  Quantitative sensory testing. This test is used to assess how your nerves respond to vibration and changes in temperature.  Radiculoplexus Neuropathy  Often the first thing is to eliminate any other issue or problems that might be the cause, as there is no standard test for diagnosis.  X-ray exam of your spine and lumbar region.  Spinal tap to rule out cancer.  MRI to rule out other lesions. How is this treated? Once nerve damage occurs, it cannot be reversed. The goal of treatment is to keep the disease or nerve damage from getting worse and affecting more nerve fibers. Controlling your blood glucose level is the key. Most people with radiculoplexus neuropathy see at least a partial improvement over time. You will need to keep your blood glucose and HbA1c levels in the target range determined by your health care provider. Things that help control blood glucose levels include:  Blood glucose monitoring.  Meal planning.  Physical activity.  Diabetes medicine.  Over time, maintaining lower blood glucose levels helps lessen symptoms. Sometimes, prescription pain medicine is needed. Follow these instructions at home:  Do not smoke.  Keep your blood glucose level in the range that you and your health care provider have determined acceptable for you.  Keep your blood pressure level in the range that you and your health care provider  have determined acceptable for you.  Eat a well-balanced diet.  Be physically active every day. Include strength training and balance exercises.  Protect your feet. ? Check your feet every day for sores, cuts, blisters, or signs of infection. ? Wear padded socks and supportive shoes. Use orthotic inserts, if necessary. ? Regularly check the insides of your shoes for worn spots. Make sure there are no rocks or other items inside your shoes before you put them on. Contact a health care provider if:  You have burning, stabbing, or aching pain in the legs or feet.  You are unable to feel pressure or pain in your feet.  You develop problems with digestion such as: ? Nausea. ? Vomiting. ? Bloating. ? Constipation. ? Diarrhea. ? Abdominal pain.  You have difficulty with urination, such as: ? Incontinence. ? Retention.  You have palpitations.  You develop orthostatic hypotension. When you stand up you may feel: ? Dizzy. ? Weak. ? Faint.  You  cannot attain and maintain an erection (in men).  You have vaginal dryness and problems with decreased sexual desire and arousal (in women).  You have severe pain in your thighs, legs, or buttocks.  You have unexplained weight loss. This information is not intended to replace advice given to you by your health care provider. Make sure you discuss any questions you have with your health care provider. Document Released: 12/02/2001 Document Revised: 02/29/2016 Document Reviewed: 03/04/2013 Elsevier Interactive Patient Education  2017 [place].    Diabetes Mellitus and Food It is important for you to manage your blood sugar (glucose) level. Your blood glucose level can be greatly affected by what you eat. Eating healthier foods in the appropriate amounts throughout the day at about the same time each day will help you control your blood glucose level. It can also help slow or prevent worsening of your diabetes mellitus. Healthy eating  may even help you improve the level of your blood pressure and reach or maintain a healthy weight. General recommendations for healthful eating and cooking habits include:  Eating meals and snacks regularly. Avoid going long periods of time without eating to lose weight.  Eating a diet that consists mainly of plant-based foods, such as fruits, vegetables, nuts, legumes, and whole grains.  Using low-heat cooking methods, such as baking, instead of high-heat cooking methods, such as deep frying.  Work with your dietitian to make sure you understand how to use the Nutrition Facts information on food labels. How can food affect me? Carbohydrates Carbohydrates affect your blood glucose level more than any other type of food. Your dietitian will help you determine how many carbohydrates to eat at each meal and teach you how to count carbohydrates. Counting carbohydrates is important to keep your blood glucose at a healthy level, especially if you are using insulin or taking certain medicines for diabetes mellitus. Alcohol Alcohol can cause sudden decreases in blood glucose (hypoglycemia), especially if you use insulin or take certain medicines for diabetes mellitus. Hypoglycemia can be a life-threatening condition. Symptoms of hypoglycemia (sleepiness, dizziness, and disorientation) are similar to symptoms of having too much alcohol. If your health care provider has given you approval to drink alcohol, do so in moderation and use the following guidelines:  Women should not have more than one drink per day, and men should not have more than two drinks per day. One drink is equal to: ? 12 oz of beer. ? 5 oz of wine. ? 1 oz of hard liquor.  Do not drink on an empty stomach.  Keep yourself hydrated. Have water, diet soda, or unsweetened iced tea.  Regular soda, juice, and other mixers might contain a lot of carbohydrates and should be counted.  What foods are not recommended? As you make food  choices, it is important to remember that all foods are not the same. Some foods have fewer nutrients per serving than other foods, even though they might have the same number of calories or carbohydrates. It is difficult to get your body what it needs when you eat foods with fewer nutrients. Examples of foods that you should avoid that are high in calories and carbohydrates but low in nutrients include:  Trans fats (most processed foods list trans fats on the Nutrition Facts label).  Regular soda.  Juice.  Candy.  Sweets, such as cake, pie, doughnuts, and cookies.  Fried foods.  What foods can I eat? Eat nutrient-rich foods, which will nourish your body and keep you  healthy. The food you should eat also will depend on several factors, including:  The calories you need.  The medicines you take.  Your weight.  Your blood glucose level.  Your blood pressure level.  Your cholesterol level.  You should eat a variety of foods, including:  Protein. ? Lean cuts of meat. ? Proteins low in saturated fats, such as fish, egg whites, and beans. Avoid processed meats.  Fruits and vegetables. ? Fruits and vegetables that may help control blood glucose levels, such as apples, mangoes, and yams.  Dairy products. ? Choose fat-free or low-fat dairy products, such as milk, yogurt, and cheese.  Grains, bread, pasta, and rice. ? Choose whole grain products, such as multigrain bread, whole oats, and brown rice. These foods may help control blood pressure.  Fats. ? Foods containing healthful fats, such as nuts, avocado, olive oil, canola oil, and fish.  Does everyone with diabetes mellitus have the same meal plan? Because every person with diabetes mellitus is different, there is not one meal plan that works for everyone. It is very important that you meet with a dietitian who will help you create a meal plan that is just right for you. This information is not intended to replace advice  given to you by your health care provider. Make sure you discuss any questions you have with your health care provider. Document Released: 06/20/2005 Document Revised: 02/29/2016 Document Reviewed: 08/20/2013 Elsevier Interactive Patient Education  2017 [employer].

## 2017-05-09 ENCOUNTER — Other Ambulatory Visit: Payer: Self-pay

## 2017-05-14 ENCOUNTER — Encounter (HOSPITAL_COMMUNITY): Payer: Self-pay | Admitting: *Deleted

## 2017-05-14 NOTE — Progress Notes (Signed)
Pt denies SOB, chest pain, and being under the care of a cardiologist. Pt denies having a cardiac cath but stated that a stress test was performed > 10 years ago. Pt denies having a chest x ray within the last year. Pt made aware to stop taking vitamins, fish oil and herbal medications. Do not take any NSAIDs ie: Ibuprofen, Advil, Naproxen (Aleve/Anaprox), Motrin, BC and Goody Powder. Pt to take half dose of NPH 70/30 (SS 5-10 units) the night before surgery. Pt made aware to check BG every 2 hours prior to arrival to hospital on DOS. Pt made aware to treat a BG < 70 with 4 ounces of apple or cranberry juice, wait 15 minutes after intervention to recheck BG, if BG remains < 70, call Short Stay unit to speak with a nurse. Pt verbalized understanding of all pre-op instructions.

## 2017-05-16 ENCOUNTER — Ambulatory Visit (HOSPITAL_COMMUNITY)
Admission: RE | Admit: 2017-05-16 | Discharge: 2017-05-16 | Disposition: A | Discharge status: Home / Self Care | Payer: No Typology Code available for payment source | Source: Ambulatory Visit | Attending: Vascular Surgery | Admitting: Vascular Surgery

## 2017-05-16 ENCOUNTER — Ambulatory Visit (HOSPITAL_COMMUNITY): Payer: Self-pay | Admitting: Certified Registered"

## 2017-05-16 ENCOUNTER — Encounter (HOSPITAL_COMMUNITY): Payer: Self-pay | Admitting: *Deleted

## 2017-05-16 ENCOUNTER — Encounter (HOSPITAL_COMMUNITY)
Admission: RE | Disposition: A | Discharge status: Home / Self Care | Payer: Self-pay | Source: Ambulatory Visit | Attending: Vascular Surgery

## 2017-05-16 DIAGNOSIS — N184 Chronic kidney disease, stage 4 (severe): Secondary | ICD-10-CM | POA: Diagnosis not present

## 2017-05-16 DIAGNOSIS — Z820 Family history of epilepsy and other diseases of the nervous system: Secondary | ICD-10-CM | POA: Insufficient documentation

## 2017-05-16 DIAGNOSIS — Z880 Allergy status to penicillin: Secondary | ICD-10-CM | POA: Insufficient documentation

## 2017-05-16 DIAGNOSIS — Z886 Allergy status to analgesic agent status: Secondary | ICD-10-CM | POA: Insufficient documentation

## 2017-05-16 DIAGNOSIS — M1990 Unspecified osteoarthritis, unspecified site: Secondary | ICD-10-CM | POA: Insufficient documentation

## 2017-05-16 DIAGNOSIS — I120 Hypertensive chronic kidney disease with stage 5 chronic kidney disease or end stage renal disease: Secondary | ICD-10-CM | POA: Insufficient documentation

## 2017-05-16 DIAGNOSIS — Z992 Dependence on renal dialysis: Secondary | ICD-10-CM | POA: Insufficient documentation

## 2017-05-16 DIAGNOSIS — Z807 Family history of other malignant neoplasms of lymphoid, hematopoietic and related tissues: Secondary | ICD-10-CM | POA: Insufficient documentation

## 2017-05-16 DIAGNOSIS — E1122 Type 2 diabetes mellitus with diabetic chronic kidney disease: Secondary | ICD-10-CM | POA: Insufficient documentation

## 2017-05-16 DIAGNOSIS — Z833 Family history of diabetes mellitus: Secondary | ICD-10-CM | POA: Insufficient documentation

## 2017-05-16 DIAGNOSIS — N186 End stage renal disease: Secondary | ICD-10-CM | POA: Insufficient documentation

## 2017-05-16 DIAGNOSIS — K279 Peptic ulcer, site unspecified, unspecified as acute or chronic, without hemorrhage or perforation: Secondary | ICD-10-CM | POA: Insufficient documentation

## 2017-05-16 DIAGNOSIS — F17210 Nicotine dependence, cigarettes, uncomplicated: Secondary | ICD-10-CM | POA: Insufficient documentation

## 2017-05-16 DIAGNOSIS — Z79899 Other long term (current) drug therapy: Secondary | ICD-10-CM | POA: Insufficient documentation

## 2017-05-16 DIAGNOSIS — Z794 Long term (current) use of insulin: Secondary | ICD-10-CM | POA: Insufficient documentation

## 2017-05-16 HISTORY — PX: AV FISTULA PLACEMENT: SHX1204

## 2017-05-16 HISTORY — DX: Adverse effect of unspecified anesthetic, initial encounter: T41.45XA

## 2017-05-16 HISTORY — DX: Chronic or unspecified peptic ulcer, site unspecified, with hemorrhage: K27.4

## 2017-05-16 HISTORY — DX: Chronic or unspecified gastrojejunal ulcer with hemorrhage: K28.4

## 2017-05-16 HISTORY — DX: Other specified abnormal findings of blood chemistry: R79.89

## 2017-05-16 HISTORY — DX: Chronic kidney disease, unspecified: N18.9

## 2017-05-16 HISTORY — DX: Other complications of anesthesia, initial encounter: T88.59XA

## 2017-05-16 HISTORY — DX: Personal history of urinary calculi: Z87.442

## 2017-05-16 HISTORY — DX: Cerebral infarction, unspecified: I63.9

## 2017-05-16 HISTORY — DX: Type 2 diabetes mellitus with diabetic neuropathy, unspecified: E11.40

## 2017-05-16 LAB — GLUCOSE, CAPILLARY
GLUCOSE-CAPILLARY: 89 mg/dL (ref 65–99)
Glucose-Capillary: 96 mg/dL (ref 65–99)
Glucose-Capillary: 93 mg/dL (ref 65–99)

## 2017-05-16 LAB — POCT I-STAT 4, (NA,K, GLUC, HGB,HCT)
GLUCOSE: 99 mg/dL (ref 65–99)
HEMATOCRIT: 36 % — AB (ref 39.0–52.0)
Hemoglobin: 12.2 g/dL — ABNORMAL LOW (ref 13.0–17.0)
Potassium: 3.9 mmol/L (ref 3.5–5.1)
Sodium: 138 mmol/L (ref 135–145)

## 2017-05-16 SURGERY — ARTERIOVENOUS (AV) FISTULA CREATION
Anesthesia: Monitor Anesthesia Care | Site: Arm Lower | Laterality: Left

## 2017-05-16 MED ORDER — DEXAMETHASONE SODIUM PHOSPHATE 10 MG/ML IJ SOLN
INTRAMUSCULAR | Status: DC | PRN
  Administered 2017-05-16: 4 mg via INTRAVENOUS

## 2017-05-16 MED ORDER — FENTANYL CITRATE (PF) 100 MCG/2ML IJ SOLN
INTRAMUSCULAR | Status: DC | PRN
  Administered 2017-05-16 (×2): 50 ug via INTRAVENOUS

## 2017-05-16 MED ORDER — SODIUM CHLORIDE 0.9 % IV SOLN
INTRAVENOUS | Status: DC
  Administered 2017-05-16: 11:00:00 via INTRAVENOUS

## 2017-05-16 MED ORDER — PROPOFOL 10 MG/ML IV BOLUS
INTRAVENOUS | Status: DC | PRN
  Administered 2017-05-16: 20 mg via INTRAVENOUS
  Administered 2017-05-16: 10 mg via INTRAVENOUS

## 2017-05-16 MED ORDER — HEPARIN SODIUM (PORCINE) 1000 UNIT/ML IJ SOLN
INTRAMUSCULAR | Status: AC
  Filled 2017-05-16: qty 2

## 2017-05-16 MED ORDER — HEPARIN SODIUM (PORCINE) 1000 UNIT/ML IJ SOLN
INTRAMUSCULAR | Status: AC
  Filled 2017-05-16: qty 1

## 2017-05-16 MED ORDER — MIDAZOLAM HCL 2 MG/2ML IJ SOLN
INTRAMUSCULAR | Status: AC
  Filled 2017-05-16: qty 2

## 2017-05-16 MED ORDER — THROMBIN 20000 UNITS EX SOLR
CUTANEOUS | Status: AC
  Filled 2017-05-16: qty 20000

## 2017-05-16 MED ORDER — ONDANSETRON HCL 4 MG/2ML IJ SOLN
INTRAMUSCULAR | Status: AC
  Filled 2017-05-16: qty 2

## 2017-05-16 MED ORDER — HEPARIN SODIUM (PORCINE) 1000 UNIT/ML IJ SOLN
INTRAMUSCULAR | Status: DC | PRN
  Administered 2017-05-16: 7000 [IU] via INTRAVENOUS

## 2017-05-16 MED ORDER — MEPERIDINE HCL 25 MG/ML IJ SOLN: 6.2500 mg | INTRAMUSCULAR | Status: DC | PRN

## 2017-05-16 MED ORDER — HYDROMORPHONE HCL 1 MG/ML IJ SOLN: 0.2500 mg | INTRAMUSCULAR | Status: DC | PRN

## 2017-05-16 MED ORDER — PROPOFOL 500 MG/50ML IV EMUL
INTRAVENOUS | Status: DC | PRN
  Administered 2017-05-16: 75 ug/kg/min via INTRAVENOUS

## 2017-05-16 MED ORDER — PROMETHAZINE HCL 25 MG/ML IJ SOLN: 6.2500 mg | INTRAMUSCULAR | Status: DC | PRN

## 2017-05-16 MED ORDER — ONDANSETRON HCL 4 MG/2ML IJ SOLN
INTRAMUSCULAR | Status: DC | PRN
  Administered 2017-05-16: 4 mg via INTRAVENOUS

## 2017-05-16 MED ORDER — PROTAMINE SULFATE 10 MG/ML IV SOLN
INTRAVENOUS | Status: AC
  Filled 2017-05-16: qty 5

## 2017-05-16 MED ORDER — PAPAVERINE HCL 30 MG/ML IJ SOLN
INTRAMUSCULAR | Status: AC
  Filled 2017-05-16: qty 2

## 2017-05-16 MED ORDER — CHLORHEXIDINE GLUCONATE CLOTH 2 % EX PADS: 6.0000 | MEDICATED_PAD | Freq: Once | CUTANEOUS | Status: DC

## 2017-05-16 MED ORDER — PROPOFOL 1000 MG/100ML IV EMUL
INTRAVENOUS | Status: AC
  Filled 2017-05-16: qty 100

## 2017-05-16 MED ORDER — FENTANYL CITRATE (PF) 250 MCG/5ML IJ SOLN
INTRAMUSCULAR | Status: AC
  Filled 2017-05-16: qty 5

## 2017-05-16 MED ORDER — OXYCODONE-ACETAMINOPHEN 5-325 MG PO TABS: 1.0000 | ORAL_TABLET | ORAL | 0 refills | Status: DC | PRN

## 2017-05-16 MED ORDER — 0.9 % SODIUM CHLORIDE (POUR BTL) OPTIME
TOPICAL | Status: DC | PRN
  Administered 2017-05-16: 1000 mL

## 2017-05-16 MED ORDER — VANCOMYCIN HCL IN DEXTROSE 1-5 GM/200ML-% IV SOLN
1000.0000 mg | INTRAVENOUS | Status: AC
  Administered 2017-05-16: 1000 mg via INTRAVENOUS

## 2017-05-16 MED ORDER — LIDOCAINE HCL (PF) 1 % IJ SOLN
INTRAMUSCULAR | Status: AC
  Filled 2017-05-16: qty 30

## 2017-05-16 MED ORDER — SODIUM CHLORIDE 0.9 % IV SOLN
INTRAVENOUS | Status: DC | PRN
  Administered 2017-05-16: 15:00:00

## 2017-05-16 MED ORDER — PROTAMINE SULFATE 10 MG/ML IV SOLN
INTRAVENOUS | Status: DC | PRN
  Administered 2017-05-16: 40 mg via INTRAVENOUS

## 2017-05-16 MED ORDER — LIDOCAINE HCL (PF) 1 % IJ SOLN
INTRAMUSCULAR | Status: DC | PRN
  Administered 2017-05-16: 30 mL

## 2017-05-16 MED ORDER — MIDAZOLAM HCL 5 MG/5ML IJ SOLN
INTRAMUSCULAR | Status: DC | PRN
  Administered 2017-05-16: 2 mg via INTRAVENOUS

## 2017-05-16 MED ORDER — DEXAMETHASONE SODIUM PHOSPHATE 10 MG/ML IJ SOLN
INTRAMUSCULAR | Status: AC
  Filled 2017-05-16: qty 1

## 2017-05-16 SURGICAL SUPPLY — 40 items
ADH SKN CLS APL DERMABOND .7 (GAUZE/BANDAGES/DRESSINGS) ×1
ARMBAND PINK RESTRICT EXTREMIT (MISCELLANEOUS) ×4 IMPLANT
CANISTER SUCT 3000ML PPV (MISCELLANEOUS) ×3 IMPLANT
CANNULA VESSEL 3MM 2 BLNT TIP (CANNULA) ×5 IMPLANT
CLIP TI WIDE RED SMALL 6 (CLIP) ×4 IMPLANT
CLIP VESOCCLUDE MED 6/CT (CLIP) ×3 IMPLANT
CLIP VESOCCLUDE SM WIDE 6/CT (CLIP) ×3 IMPLANT
COVER PROBE W GEL 5X96 (DRAPES) IMPLANT
DECANTER SPIKE VIAL GLASS SM (MISCELLANEOUS) ×1 IMPLANT
DERMABOND ADVANCED (GAUZE/BANDAGES/DRESSINGS) ×2
DERMABOND ADVANCED .7 DNX12 (GAUZE/BANDAGES/DRESSINGS) ×1 IMPLANT
ELECT REM PT RETURN 9FT ADLT (ELECTROSURGICAL) ×3
ELECTRODE REM PT RTRN 9FT ADLT (ELECTROSURGICAL) ×1 IMPLANT
GLOVE BIO SURGEON STRL SZ 6.5 (GLOVE) ×1 IMPLANT
GLOVE BIO SURGEON STRL SZ7.5 (GLOVE) ×3 IMPLANT
GLOVE BIO SURGEONS STRL SZ 6.5 (GLOVE) ×1
GLOVE BIOGEL PI IND STRL 6.5 (GLOVE) IMPLANT
GLOVE BIOGEL PI IND STRL 8 (GLOVE) ×1 IMPLANT
GLOVE BIOGEL PI INDICATOR 6.5 (GLOVE) ×6
GLOVE BIOGEL PI INDICATOR 8 (GLOVE) ×2
GLOVE ECLIPSE 6.5 STRL STRAW (GLOVE) ×2 IMPLANT
GLOVE SURG SS PI 6.5 STRL IVOR (GLOVE) ×2 IMPLANT
GOWN STRL REUS W/ TWL LRG LVL3 (GOWN DISPOSABLE) ×3 IMPLANT
GOWN STRL REUS W/TWL LRG LVL3 (GOWN DISPOSABLE) ×9
KIT BASIN OR (CUSTOM PROCEDURE TRAY) ×3 IMPLANT
KIT ROOM TURNOVER OR (KITS) ×3 IMPLANT
NDL 18GX1X1/2 (RX/OR ONLY) (NEEDLE) IMPLANT
NEEDLE 18GX1X1/2 (RX/OR ONLY) (NEEDLE) ×3 IMPLANT
NS IRRIG 1000ML POUR BTL (IV SOLUTION) ×3 IMPLANT
PACK CV ACCESS (CUSTOM PROCEDURE TRAY) ×3 IMPLANT
PAD ARMBOARD 7.5X6 YLW CONV (MISCELLANEOUS) ×6 IMPLANT
SPONGE SURGIFOAM ABS GEL 100 (HEMOSTASIS) IMPLANT
SUT PROLENE 6 0 BV (SUTURE) ×5 IMPLANT
SUT VIC AB 3-0 SH 27 (SUTURE) ×3
SUT VIC AB 3-0 SH 27X BRD (SUTURE) ×1 IMPLANT
SUT VICRYL 4-0 PS2 18IN ABS (SUTURE) ×3 IMPLANT
SYR 20CC LL (SYRINGE) ×2 IMPLANT
SYR 3ML LL SCALE MARK (SYRINGE) ×2 IMPLANT
UNDERPAD 30X30 (UNDERPADS AND DIAPERS) ×3 IMPLANT
WATER STERILE IRR 1000ML POUR (IV SOLUTION) ×3 IMPLANT

## 2017-05-16 NOTE — Anesthesia Postprocedure Evaluation (Signed)
Anesthesia Post Note  Patient: Brian [name]  Procedure(s) Performed: Procedure(s) (LRB): ARTERIOVENOUS (AV) FISTULA CREATION-LEFT (Left)     Patient location during evaluation: PACU Anesthesia Type: MAC Level of consciousness: awake and alert, patient cooperative and oriented Pain management: pain level controlled Vital Signs Assessment: post-procedure vital signs reviewed and stable Respiratory status: spontaneous breathing, nonlabored ventilation and respiratory function stable Cardiovascular status: blood pressure returned to baseline and stable Postop Assessment: no signs of nausea or vomiting Anesthetic complications: no    Last Vitals:  Vitals:   05/16/17 0959 05/16/17 1635  BP: (!) 170/92   Pulse: 91   Resp: 20   Temp: 36.6 C 36.5 C  SpO2: 100%     Last Pain:  Vitals:   05/16/17 1635  TempSrc:   PainSc: 0-No pain                 Yovan Leeman,E. Keonta Alsip

## 2017-05-16 NOTE — Transfer of Care (Signed)
Immediate Anesthesia Transfer of Care Note  Patient: Brian [name]  Procedure(s) Performed: Procedure(s): ARTERIOVENOUS (AV) FISTULA CREATION-LEFT (Left)  Patient Location: PACU  Anesthesia Type:MAC  Level of Consciousness: awake and patient cooperative  Airway & Oxygen Therapy: Patient Spontanous Breathing and Patient connected to nasal cannula oxygen  Post-op Assessment: Report given to RN, Post -op Vital signs reviewed and stable and Patient moving all extremities  Post vital signs: Reviewed and stable  Last Vitals:  Vitals:   05/16/17 0959 05/16/17 1635  BP: (!) 170/92   Pulse: 91   Resp: 20   Temp: 36.6 C (P) 36.5 C  SpO2: 100%     Last Pain:  Vitals:   05/16/17 1635  TempSrc:   PainSc: (P) 0-No pain      Patients Stated Pain Goal: 3 (78/67/67 2094)  Complications: No apparent anesthesia complications

## 2017-05-16 NOTE — Interval H&P Note (Signed)
History and Physical Interval Note:  05/16/2017 2:39 PM  Brian [name]  has presented today for surgery, with the diagnosis of Chronic Kidney Disease stage 5   N18.5  The various methods of treatment have been discussed with the patient and family. After consideration of risks, benefits and other options for treatment, the patient has consented to  Procedure(s): ARTERIOVENOUS (AV) FISTULA CREATION-LEFT (Left) as a surgical intervention .  The patient's history has been reviewed, patient examined, no change in status, stable for surgery.  I have reviewed the patient's chart and labs.  Questions were answered to the patient's satisfaction.     Deitra Mayo

## 2017-05-16 NOTE — Anesthesia Preprocedure Evaluation (Signed)
Anesthesia Evaluation  Patient identified by MRN, date of birth, ID band Patient awake    Reviewed: Allergy & Precautions, NPO status , Patient's Chart, lab work & pertinent test results  Airway Mallampati: II  TM Distance: >3 FB Neck ROM: Full    Dental no notable dental hx.    Pulmonary Current Smoker,    Pulmonary exam normal breath sounds clear to auscultation       Cardiovascular hypertension, Normal cardiovascular exam Rhythm:Regular Rate:Normal     Neuro/Psych CVA negative psych ROS   GI/Hepatic Neg liver ROS, PUD,   Endo/Other  diabetes  Renal/GU Renal diseasenegative Renal ROS     Musculoskeletal  (+) Arthritis ,   Abdominal   Peds  Hematology negative hematology ROS (+)   Anesthesia Other Findings   Reproductive/Obstetrics negative OB ROS                             Anesthesia Physical Anesthesia Plan  ASA: III  Anesthesia Plan: MAC   Post-op Pain Management:    Induction: Intravenous  PONV Risk Score and Plan: 3 and Ondansetron, Dexamethasone, Midazolam and Propofol infusion  Airway Management Planned:   Additional Equipment:   Intra-op Plan:   Post-operative Plan:   Informed Consent: I have reviewed the patients History and Physical, chart, labs and discussed the procedure including the risks, benefits and alternatives for the proposed anesthesia with the patient or authorized representative who has indicated his/her understanding and acceptance.   Dental advisory given  Plan Discussed with: CRNA  Anesthesia Plan Comments:         Anesthesia Quick Evaluation

## 2017-05-16 NOTE — Op Note (Signed)
    NAME: Brian [name]    MRN: [id number] DOB: Feb 08, 1961    DATE OF OPERATION: 05/16/2017  PREOP DIAGNOSIS:    Stage IV chronic kidney disease  POSTOP DIAGNOSIS:    Same  PROCEDURE:    LEFT RADIAL CEPHALIC AV FISTULA   SURGEON: Judeth Cornfield. Scot Dock, MD, FACS  ASSIST: Lafe Garin RNFA  ANESTHESIA: Local with sedation   EBL: Minimal  INDICATIONS:    [name] is a 56 y.o. male who is not yet on dialysis.  FINDINGS:    3 mm forearm cephalic vein 2.5 mm radial artery  TECHNIQUE:    The patient was taken to the operating room and sedated by anesthesia. The left upper extremity was prepped and draped in usual sterile fashion. The cephalic vein was fairly far lateral on the wrist and therefore I made a separate incision over this after the skin was anesthetized. Cephalic vein was dissected free and ligated distally. A separate incision was made over the radial artery after the skin was anesthetized. The patient was heparinized. The vein was ligated distally and irrigated out with heparinized saline. Tunnel was graded between the 2 incisions and the vein brought over for anastomosis to the radial artery. The vein was spatulated. The radial artery was clamped proximally and distally and a longitudinal arteriotomy was made. The vein was sewn end-to-side to the radial artery using continuous 6-0 Prolene suture. At the completion was an excellent thrill in the fistula. Hemostasis was obtained in the wound and heparin was partially reversed with protamine. The wounds were closed with interrupted 3-0 Vicryl. The skin was closed with 4-0 Vicryl. Sterile dressing was applied. The patient tolerated the procedure well was transferred to recovery in stable condition. All needle and sponge counts were correct.  Deitra Mayo, MD, FACS [facility]  DATE OF DICTATION:   05/16/2017

## 2017-05-16 NOTE — H&P (View-Only) (Signed)
Patient name: Brian [name] MRN: [id number] DOB: 1960-11-12 Sex: male   REASON FOR CONSULT:    To evaluate for AV fistula. The physician requesting the consult was Dr. Pearson Grippe.  HPI:   Brian [name] is a pleasant 56 y.o. male,  who is referred for evaluation for hemodialysis access. He is right-handed. He denies any recent uremic symptoms. Specifically, he denies nausea, vomiting, fatigue, anorexia, or palpitations.  He does have some arthritis in his right wrist and wears a brace. He is right-handed.  His end-stage renal disease secondary to diabetes and hypertension.  I have reviewed the records that were sent from [facility]. The patient has explored all of his options including peroneal dialysis, transplant, and hemodialysis. He is ready to proceed with a fistula and a proactive manner. He had an echo in May 2018 which showed an ejection fraction of 60-65%. His GFR is 11. He has stage V chronic kidney disease in addition to type 2 diabetes, hypertension.  Past Medical History:  Diagnosis Date  . Arthritis   . Diabetes mellitus without complication ([place])   . Hypertension   . Tobacco abuse     Family History  Problem Relation Age of Onset  . Lymphoma Mother   . Hypertension Father   . Diabetes Mellitus II Father   . Hypertension Brother   . Diabetes Mellitus I Brother     SOCIAL HISTORY: Social History   Social History  . Marital status: Widowed    Spouse name: N/A  . Number of children: N/A  . Years of education: N/A   Occupational History  . Not on file.   Social History Main Topics  . Smoking status: Current Every Day Smoker    Packs/day: 1.00    Types: Cigarettes  . Smokeless tobacco: Never Used  . Alcohol use No  . Drug use: No  . Sexual activity: Not on file   Other Topics Concern  . Not on file   Social History Narrative  . No narrative on file    Allergies  Allergen Reactions  . Aspirin Other (See Comments)    Other  Reaction: GI Upset  . Penicillins Hives and Rash    Current Outpatient Prescriptions  Medication Sig Dispense Refill  . amLODipine (NORVASC) 10 MG tablet Take 10 mg by mouth daily.    . furosemide (LASIX) 80 MG tablet Take 1 tablet (80 mg total) by mouth 2 (two) times daily. 60 tablet 2  . Insulin NPH Isophane & Regular (NOVOLIN 70/30 [place]) Inject into the skin.    Marland Kitchen atenolol (TENORMIN) 100 MG tablet Take 100 mg by mouth daily.    Marland Kitchen HYDROcodone-acetaminophen (NORCO/VICODIN) 5-325 MG tablet Take 1-2 tablets by mouth every 6 (six) hours as needed (for pain). (Patient not taking: Reported on 04/17/2017) 20 tablet 0  . insulin aspart protamine- aspart (NOVOLOG MIX 70/30) (70-30) 100 UNIT/ML injection Inject 0.05 mLs (5 Units total) into the skin at bedtime. (Patient not taking: Reported on 04/17/2017) 10 mL 0  . nicotine (NICODERM CQ - DOSED IN MG/24 HOURS) 21 mg/24hr patch Place 1 patch (21 mg total) onto the skin daily. (Patient not taking: Reported on 04/17/2017) 28 patch 0   No current facility-administered medications for this visit.     REVIEW OF SYSTEMS:  [X]  denotes positive finding, [ ]  denotes negative finding Cardiac  Comments:  Chest pain or chest pressure:    Shortness of breath upon exertion:    Short of  breath when lying flat:    Irregular heart rhythm:        Vascular    Pain in calf, thigh, or hip brought on by ambulation:    Pain in feet at night that wakes you up from your sleep:     Blood clot in your veins:    Leg swelling:         Pulmonary    Oxygen at home:    Productive cough:     Wheezing:         Neurologic    Sudden weakness in arms or legs:     Sudden numbness in arms or legs:     Sudden onset of difficulty speaking or slurred speech:    Temporary loss of vision in one eye:     Problems with dizziness:         Gastrointestinal    Blood in stool:     Vomited blood:         Genitourinary    Burning when urinating:     Blood in urine:          Psychiatric    Major depression:         Hematologic    Bleeding problems:    Problems with blood clotting too easily:        Skin    Rashes or ulcers:        Constitutional    Fever or chills:     PHYSICAL EXAM:   Vitals:   04/17/17 1326 04/17/17 1328  BP: (!) 152/94 (!) 145/91  Pulse: 86 86  Resp: 16   Temp: (!) 96.9 F (36.1 C)   SpO2: 99%   Weight: 176 lb (79.8 kg)   Height: 5\' 9"  (1.753 m)     GENERAL: The patient is a well-nourished male, in no acute distress. The vital signs are documented above. CARDIAC: There is a regular rate and rhythm.  VASCULAR: I do not detect carotid bruits. He has brachial and radial pulses bilaterally. He has no significant lower extremity swelling. PULMONARY: There is good air exchange bilaterally without wheezing or rales. ABDOMEN: Soft and non-tender with normal pitched bowel sounds.  MUSCULOSKELETAL: There are no major deformities or cyanosis. NEUROLOGIC: No focal weakness or paresthesias are detected. SKIN: There are no ulcers or rashes noted. PSYCHIATRIC: The patient has a normal affect.  DATA:    VEIN MAP: I have reviewed the vein mapping was done on 04/08/2017.  On the left side a forearm and upper arm cephalic vein looked to be reasonable in size and he appears to be a good candidate for a fistula.  UPPER EXTREMITY ARTERIAL DUPLEX: I have reviewed the upper extremity arterial duplex was done on 04/08/2017.  On the left artery has a triphasic radial and ulnar signal. The brachial artery is 0.41 cm in diameter.  MEDICAL ISSUES:   STAGE V CHRONIC KIDNEY DISEASE: He appears to be a good candidate for a left radiocephalic or brachiocephalic fistula. His GFR is 10 so if he is not a candidate for fistula that I think we would place a graft although this is not likely. I have explained the indications for placement of an AV fistula or AV graft. I've explained that if at all possible we will place an AV fistula.  I have reviewed  the risks of placement of an AV fistula including but not limited to: failure of the fistula to mature, need for subsequent interventions, and thrombosis. In addition  I have reviewed the potential complications of placement of an AV graft. These risks include, but are not limited to, graft thrombosis, graft infection, wound healing problems, bleeding, arm swelling, and steal syndrome. All the patient's questions were answered and they are agreeable to proceed with surgery.  HYPERTENSION: The patient's blood pressure is under reasonable control.  Deitra Mayo [facility] [place] [facility] [phone]

## 2017-05-17 ENCOUNTER — Encounter (HOSPITAL_COMMUNITY): Payer: Self-pay | Admitting: Vascular Surgery

## 2017-05-19 ENCOUNTER — Telehealth: Payer: Self-pay | Admitting: Vascular Surgery

## 2017-05-19 NOTE — Telephone Encounter (Signed)
[place] and OV on 9/26 spoke to pt on home # mailed letter

## 2017-05-19 NOTE — Telephone Encounter (Signed)
-----   Message from Mena Goes, RN sent at 05/16/2017  5:27 PM EDT ----- Regarding: 6 weeks w/ duplex   ----- Message ----- From: Angelia Mould, MD Sent: 05/16/2017   5:04 PM To: [facility] Charge Pool Subject: charge and f/u                                  PROCEDURE:   LEFT RADIAL CEPHALIC AV FISTULA   SURGEON: Judeth Cornfield. Scot Dock, MD, FACS  ASSIST: Lafe Garin RNFA  He will need a follow up visit in 6 weeks with a duplex at that time to check on the maturation of his fistula. Thank you. CD

## 2017-06-20 ENCOUNTER — Other Ambulatory Visit: Payer: Self-pay

## 2017-06-20 DIAGNOSIS — Z48812 Encounter for surgical aftercare following surgery on the circulatory system: Secondary | ICD-10-CM

## 2017-07-02 ENCOUNTER — Ambulatory Visit (INDEPENDENT_AMBULATORY_CARE_PROVIDER_SITE_OTHER): Payer: Self-pay | Admitting: Vascular Surgery

## 2017-07-02 ENCOUNTER — Ambulatory Visit (HOSPITAL_COMMUNITY)
Admission: RE | Admit: 2017-07-02 | Discharge: 2017-07-02 | Disposition: A | Discharge status: Home / Self Care | Payer: No Typology Code available for payment source | Source: Ambulatory Visit | Attending: Vascular Surgery | Admitting: Vascular Surgery

## 2017-07-02 ENCOUNTER — Encounter: Payer: Self-pay | Admitting: Vascular Surgery

## 2017-07-02 VITALS — BP 157/97 | HR 88 | Temp 97.7°F | Ht 69.0 in | Wt 183.0 lb

## 2017-07-02 DIAGNOSIS — Z48812 Encounter for surgical aftercare following surgery on the circulatory system: Secondary | ICD-10-CM | POA: Diagnosis present

## 2017-07-02 DIAGNOSIS — N184 Chronic kidney disease, stage 4 (severe): Secondary | ICD-10-CM

## 2017-07-02 NOTE — Progress Notes (Signed)
  POST OPERATIVE OFFICE NOTE    CC:  F/u for surgery  HPI:  This is a 56 y.o. male who is s/p Left radio Cephalic fistula creation.  He is not on HD at this time.  He reports no numbness, weakness or pain in the left UE.  Allergies  Allergen Reactions  . Penicillins Hives and Rash  . Nsaids     Due to kidneys  . Aspirin Nausea And Vomiting    Other Reaction: GI Upset    Current Outpatient Prescriptions  Medication Sig Dispense Refill  . acetaminophen (TYLENOL) 650 MG CR tablet Take 1,300 mg by mouth every 8 (eight) hours as needed for pain.    Marland Kitchen amLODipine (NORVASC) 10 MG tablet Take 10 mg by mouth daily.    . Cholecalciferol (VITAMIN D) 2000 units tablet Take 4,000 Units by mouth daily.     . insulin aspart protamine- aspart (NOVOLOG MIX 70/30) (70-30) 100 UNIT/ML injection Inject 0.05 mLs (5 Units total) into the skin at bedtime. 10 mL 0  . Insulin NPH Isophane & Regular (RELION 70/30 [place]) Inject 5-10 Units into the skin at bedtime.      No current facility-administered medications for this visit.      ROS:  See HPI  Physical Exam:  Vitals:   07/02/17 1450  BP: (!) 157/97  Pulse: 88  Temp: 97.7 F (36.5 C)  SpO2: 99%    Incision:  Incision healing well, small area of stitch visible , without erythema or drainage. Extremities:  Grip 5/5, sensation intact, palpable thrill through out fistula.  Fistula duplex Shows diameter of 0.32-0.48 and depth less than 0.4 cm.  Assessment/Plan:  This is a 56 y.o. male who is 6 weeks s/p: Left radio cephalic av fistula creation.  The fistula will need more time to mature based on the size.  We want it to be at least 6 mm in diameter prior to use.  We will have him follow up in 8 weeks for a repeat fistula duplex.  He was instructed to use his left hand for everyday activities and to squeeze an exercise ball in his palm to help the fistula mature.   Laurence Slate [facility] PA-C [facility] [phone]  Clinic  MD:  Scot Dock

## 2017-07-03 NOTE — Addendum Note (Signed)
Addended by: Lianne Cure A on: 07/03/2017 10:51 AM   Modules accepted: Orders

## 2017-08-27 ENCOUNTER — Encounter: Payer: Self-pay | Admitting: Vascular Surgery

## 2017-08-27 ENCOUNTER — Ambulatory Visit (INDEPENDENT_AMBULATORY_CARE_PROVIDER_SITE_OTHER): Payer: Self-pay | Admitting: Vascular Surgery

## 2017-08-27 ENCOUNTER — Ambulatory Visit (HOSPITAL_COMMUNITY)
Admission: RE | Admit: 2017-08-27 | Discharge: 2017-08-27 | Disposition: A | Discharge status: Home / Self Care | Payer: No Typology Code available for payment source | Source: Ambulatory Visit | Attending: Vascular Surgery | Admitting: Vascular Surgery

## 2017-08-27 VITALS — BP 157/88 | HR 98 | Temp 97.6°F | Resp 16 | Ht 69.0 in | Wt 183.0 lb

## 2017-08-27 DIAGNOSIS — N184 Chronic kidney disease, stage 4 (severe): Secondary | ICD-10-CM | POA: Insufficient documentation

## 2017-08-27 DIAGNOSIS — N185 Chronic kidney disease, stage 5: Secondary | ICD-10-CM

## 2017-08-27 NOTE — Progress Notes (Signed)
   Patient name: Brian [name] MRN: [id number] DOB: 1961-05-26 Sex: male  REASON FOR VISIT:   Follow-up after left radiocephalic AV fistula.  HPI:   Brian [name] is a pleasant 56 y.o. male who had a left radiocephalic fistula created on 05/16/2017.  The patient was seen in follow-up by the physician's assistant on 07/02/2017.  The duplex at that time showed that the diameters of the fistula ranged from 0.32-0.48 cm.  He was set up for an 8-week follow-up visit with a repeat duplex.  He has no specific complaints.  He denies pain or paresthesias in his left arm.  He is not on dialysis.  Current Outpatient Medications  Medication Sig Dispense Refill  . acetaminophen (TYLENOL) 650 MG CR tablet Take 1,300 mg by mouth every 8 (eight) hours as needed for pain.    Marland Kitchen amLODipine (NORVASC) 10 MG tablet Take 10 mg by mouth daily.    . Cholecalciferol (VITAMIN D) 2000 units tablet Take 4,000 Units by mouth daily.     . insulin aspart protamine- aspart (NOVOLOG MIX 70/30) (70-30) 100 UNIT/ML injection Inject 0.05 mLs (5 Units total) into the skin at bedtime. 10 mL 0  . Insulin NPH Isophane & Regular (RELION 70/30 [place]) Inject 5-10 Units into the skin at bedtime.      No current facility-administered medications for this visit.     REVIEW OF SYSTEMS:  [X]  denotes positive finding, [ ]  denotes negative finding Cardiac  Comments:  Chest pain or chest pressure:    Shortness of breath upon exertion:    Short of breath when lying flat:    Irregular heart rhythm:    Constitutional    Fever or chills:     PHYSICAL EXAM:   Vitals:   08/27/17 1608  BP: (!) 157/88  Pulse: 98  Resp: 16  Temp: 97.6 F (36.4 C)  TempSrc: Oral  SpO2: 92%  Weight: 183 lb (83 kg)  Height: 5\' 9"  (1.753 m)    GENERAL: The patient is a well-nourished male, in no acute distress. The vital signs are documented above. CARDIOVASCULAR: There is a regular rate and rhythm. PULMONARY: There is good air exchange bilaterally  without wheezing or rales. He has an excellent thrill in his fistula.  The fistula is not pulsatile.  DATA:   DUPLEX LEFT RADIOCEPHALIC AV FISTULA: I have reviewed the duplex that was done today and independently interpreted this.  The diameters of the fistula range from 0.27-0.56 cm.  There appears to be some stenosis in the radial artery just proximal to the anastomosis.  There are also some elevated velocities in the fistula in the mid forearm.  MEDICAL ISSUES:   STATUS POST LEFT RADIOCEPHALIC AV FISTULA: He has a left radiocephalic fistula is gradually improving in size.  There are a couple areas of narrowing, however I am reluctant to proceed with a fistulogram as he is currently not on dialysis.  I am reluctant to give him contrast.  I will see him back in 2 months with a follow-up duplex scan.  Hopefully by then the vein will be adequate in size for cannulation if needed.  If not, we could consider proceeding with a fistulogram.  Deitra Mayo [facility] [phone]

## 2017-09-01 NOTE — Addendum Note (Signed)
Addended by: Lianne Cure A on: 09/01/2017 09:37 AM   Modules accepted: Orders

## 2017-10-05 ENCOUNTER — Encounter (HOSPITAL_BASED_OUTPATIENT_CLINIC_OR_DEPARTMENT_OTHER): Payer: Self-pay | Admitting: *Deleted

## 2017-10-05 ENCOUNTER — Other Ambulatory Visit: Payer: Self-pay

## 2017-10-05 ENCOUNTER — Emergency Department (HOSPITAL_BASED_OUTPATIENT_CLINIC_OR_DEPARTMENT_OTHER): Payer: PRIVATE HEALTH INSURANCE

## 2017-10-05 ENCOUNTER — Emergency Department (HOSPITAL_BASED_OUTPATIENT_CLINIC_OR_DEPARTMENT_OTHER)
Admission: EM | Admit: 2017-10-05 | Discharge: 2017-10-05 | Disposition: A | Discharge status: Home / Self Care | Payer: PRIVATE HEALTH INSURANCE | Attending: Emergency Medicine | Admitting: Emergency Medicine

## 2017-10-05 DIAGNOSIS — Z794 Long term (current) use of insulin: Secondary | ICD-10-CM | POA: Diagnosis not present

## 2017-10-05 DIAGNOSIS — I120 Hypertensive chronic kidney disease with stage 5 chronic kidney disease or end stage renal disease: Secondary | ICD-10-CM | POA: Diagnosis not present

## 2017-10-05 DIAGNOSIS — Z79899 Other long term (current) drug therapy: Secondary | ICD-10-CM | POA: Insufficient documentation

## 2017-10-05 DIAGNOSIS — Z8673 Personal history of transient ischemic attack (TIA), and cerebral infarction without residual deficits: Secondary | ICD-10-CM | POA: Diagnosis not present

## 2017-10-05 DIAGNOSIS — E1040 Type 1 diabetes mellitus with diabetic neuropathy, unspecified: Secondary | ICD-10-CM | POA: Insufficient documentation

## 2017-10-05 DIAGNOSIS — M25572 Pain in left ankle and joints of left foot: Secondary | ICD-10-CM | POA: Insufficient documentation

## 2017-10-05 DIAGNOSIS — G8929 Other chronic pain: Secondary | ICD-10-CM

## 2017-10-05 DIAGNOSIS — N185 Chronic kidney disease, stage 5: Secondary | ICD-10-CM | POA: Insufficient documentation

## 2017-10-05 DIAGNOSIS — F17210 Nicotine dependence, cigarettes, uncomplicated: Secondary | ICD-10-CM | POA: Insufficient documentation

## 2017-10-05 MED ORDER — DICLOFENAC SODIUM 1 % TD GEL: 2.0000 g | Freq: Four times a day (QID) | TRANSDERMAL | 0 refills | Status: DC

## 2017-10-05 NOTE — ED Triage Notes (Signed)
Patient states he has left ankle and foot pain for the last two days.  Hx of same.  Walks a lot for work.

## 2017-10-05 NOTE — Discharge Instructions (Signed)
Your x-rays appear that your pain is likely due to degenerative changes caused by arthritis and your diabetes.  I am prescribing you a topical anti-inflammatory cream that they can apply to the affected areas.  I would also like you to take 650 mg of Tylenol every 6 hours as needed for pain.  I provided you with a cam walker boot that you can walk with.  These follow with podiatry for further evaluation.  Follow-up with your primary care doctor this week. If you develop worsening or new concerning symptoms you can return to the emergency department for re-evaluation.

## 2017-10-05 NOTE — ED Provider Notes (Signed)
[place] EMERGENCY DEPARTMENT Provider Note   CSN: [id number] Arrival date & time: 10/05/17  1324     History   Chief Complaint Chief Complaint  Patient presents with  . Ankle Pain    left    HPI Brian [name] is a 56 y.o. male with a past medical history of CKD stage V, diabetes, diabetic neuropathy, hypertension who presents the ED today for left ankle pain.  Patient states he has had on and off ankle pain over the last 5 months.  He states that his pain increases after a full weeks of working where he walks around on hard asphalt all day.  He notes his pain is primarily over the lateral malleolus.  His pain is worse with ambulation.  The patient has not taken anything for this.  He denies any fever, swelling, redness, decreased range of motion.  No FB injury or IVDU.  HPI  Past Medical History:  Diagnosis Date  . Arthritis   . Bleeding ulcer   . Chronic kidney disease    stage 5  . Complication of anesthesia    " I had a nose bleed a day or two after surgery-it could have been the oxygen"  . Diabetes mellitus without complication ([place])   . Diabetic neuropathy ([place])   . Elevated TSH   . History of kidney stones   . Hypertension   . Stroke [facility])    hemorrhagic 2010  . Tobacco abuse     Patient Active Problem List   Diagnosis Date Noted  . Type 1 diabetes mellitus with kidney complication ([place]) - on insulin 05/01/2017  . Hemorrhagic stroke [facility])- Jan 2010 05/01/2017  . H/O hyperlipidemia- sev yrs ago 05/01/2017  . h/o Hypertriglyceridemia- 13000's in past 05/01/2017  . H/O splenectomy 05/01/2017  . Status post cholecystectomy 05/01/2017  . Gout- L foot- mult times 05/01/2017  . Diabetic neuropathy ([place])- 6 mo or so; never started meds 05/01/2017  . Vitamin D deficiency 05/01/2017  . Chronic kidney disease, stage V (very severe) ([place]) 02/05/2017  . Anasarca- may 2018 hosp 02/05/2017  . Leukocytosis- s/p plenectomy 02/05/2017  . Hypertension   .  Diabetes mellitus without complication ([place])   . Tobacco abuse- >er than 55 pack yr hx; current smoker     Past Surgical History:  Procedure Laterality Date  . AV FISTULA PLACEMENT Left 05/16/2017   Procedure: ARTERIOVENOUS (AV) FISTULA CREATION-LEFT;  Surgeon: Angelia Mould, MD;  Location: [place];  Service: Vascular;  Laterality: Left;  . CHOLECYSTECTOMY    . COLONOSCOPY    . PANCREAS SURGERY    . SPLENECTOMY         Home Medications    Prior to Admission medications   Medication Sig Start Date End Date Taking? Authorizing Provider  acetaminophen (TYLENOL) 650 MG CR tablet Take 1,300 mg by mouth every 8 (eight) hours as needed for pain.   Yes [provider]  amLODipine (NORVASC) 10 MG tablet Take 10 mg by mouth daily.   Yes [provider]  Cholecalciferol (VITAMIN D) 2000 units tablet Take 4,000 Units by mouth daily.    Yes [provider]  furosemide (LASIX) 80 MG tablet Take 80 mg by mouth 2 (two) times daily.   Yes [provider]  insulin aspart protamine- aspart (NOVOLOG MIX 70/30) (70-30) 100 UNIT/ML injection Inject 0.05 mLs (5 Units total) into the skin at bedtime. 02/07/17  Yes Eugenie Filler, MD  Insulin NPH Isophane & Regular (  RELION 70/30 [place]) Inject 5-10 Units into the skin at bedtime.    Yes [provider]    Family History Family History  Problem Relation Age of Onset  . Lymphoma Mother   . Hypertension Father   . Diabetes Mellitus II Father   . Hypertension Brother   . Diabetes Mellitus I Brother     Social History Social History   Tobacco Use  . Smoking status: Current Every Day Smoker    Packs/day: 1.00    Types: Cigarettes  . Smokeless tobacco: Never Used  Substance Use Topics  . Alcohol use: No  . Drug use: No     Allergies   Penicillins; Nsaids; and Aspirin   Review of Systems Review of Systems  Constitutional: Negative for fever.  Musculoskeletal: Positive for arthralgias.  Negative for joint swelling.  Skin: Negative for wound.  Neurological: Negative for weakness and numbness.     Physical Exam Updated Vital Signs BP (!) 144/92 (BP Location: Right Arm)   Pulse 91   Temp 98.6 F (37 C) (Oral)   Resp 20   Ht 5\' 9"  (1.753 m)   Wt 81.6 kg (180 lb)   SpO2 99%   BMI 26.58 kg/m   Physical Exam  Constitutional: He appears well-developed and well-nourished.  HENT:  Head: Normocephalic and atraumatic.  Right Ear: External ear normal.  Left Ear: External ear normal.  Eyes: Conjunctivae are normal. Right eye exhibits no discharge. Left eye exhibits no discharge. No scleral icterus.  Cardiovascular:  Pulses:      Dorsalis pedis pulses are 2+ on the right side, and 2+ on the left side.       Posterior tibial pulses are 2+ on the right side, and 2+ on the left side.  No lower extremity swelling bilaterally.  No edema.  Pulmonary/Chest: Effort normal. No respiratory distress.  Musculoskeletal:       Left ankle: He exhibits normal range of motion, no swelling, no ecchymosis, no deformity, no laceration and normal pulse. Tenderness. Lateral malleolus tenderness found. No medial malleolus, no AITFL, no CF ligament, no posterior TFL, no head of 5th metatarsal and no proximal fibula tenderness found. Achilles tendon exhibits pain. Achilles tendon exhibits no defect and normal Thompson's test results.       Left foot: There is normal range of motion, no tenderness, no bony tenderness and no swelling.       Feet:  No ulcerations between the toes or on soles of feet.   Patient has a history of the diabetic neuropathy but is intact to light touch as well as sharp dull throughout foot.  Compartments soft. Cap refill <2 seconds.  No skin swelling or erythema.  There is noted to be a ingrown toenail right big toe that was recently removed per patient.   Neurological: He is alert.  Skin: No pallor.  Psychiatric: He has a normal mood and affect.  Nursing note and vitals  reviewed.    ED Treatments / Results  Labs (all labs ordered are listed, but only abnormal results are displayed) Labs Reviewed - No data to display  EKG  EKG Interpretation None       Radiology Dg Ankle Complete Left  Result Date: 10/05/2017 CLINICAL DATA:  Left ankle and foot pain for 2 days. EXAM: LEFT ANKLE COMPLETE - 3+ VIEW COMPARISON:  None. FINDINGS: Three-view exam of the left ankle shows marked degenerative changes in the tibiotalar joint with collapse of the talar dome. Degenerative changes are  seen in the midfoot. Bones are diffusely demineralized. IMPRESSION: Advanced degenerative changes in the tibiotalar joint with collapse of the talar dome. Degenerative changes noted in the midfoot but not well visualized. Electronically Signed   By: Misty Stanley M.D.   On: 10/05/2017 15:19   Dg Foot Complete Left  Result Date: 10/05/2017 CLINICAL DATA:  56 year old male with a history of left ankle and foot pain for 2 days EXAM: LEFT FOOT - COMPLETE 3+ VIEW COMPARISON:  None. FINDINGS: No acute displaced fracture. Degenerative changes of the interphalangeal joints. Osteopenia Erosive changes with mixed sclerotic changes at the head of the second metatarsal. Marginal osteophyte formation at the first metatarsophalangeal joint with mixed sclerotic and lucent changes of the metatarsal on the first phalanx. No calcifications within the soft tissue or large soft tissue nodularity. Degenerative changes of the midfoot. Lateral view demonstrates abnormal density at the talus with collapse of the talar dome, disruption of the tibiotalar joint and the talocalcaneal joint. No erosive changes. IMPRESSION: No acute fracture. Mixed erosive and sclerotic changes at the second metatarsophalangeal joint. The appearance most likely represents neuropathic joint, however, an infection cannot be excluded. Advanced degenerative changes at the first metatarsophalangeal joint. Neuropathic changes of the  tibiotalar joint and talocalcaneal joint. Electronically Signed   By: Corrie Mckusick D.O.   On: 10/05/2017 15:07    Procedures Procedures (including critical care time)  Medications Ordered in ED Medications - No data to display   Initial Impression / Assessment and Plan / ED Course  I have reviewed the triage vital signs and the nursing notes.  Pertinent labs & imaging results that were available during my care of the patient were reviewed by me and considered in my medical decision making (see chart for details).     56 year old diabetic male presenting with left ankle pain that occurs at the end of the week after long hours at work, walking on asphalt.  The patient has been without any fever, joint swelling, erythema or decreased range of motion.  He denies any foreign body injury or IV drug use.  On exam the patient has tenderness over the lateral malleolus.  He has full range of motion of all joints with intact sensation. There is no skin erythema, no joint swelling.  Do not suspect infected joint.  He is without pain along the foot.  X-rays taken in triage show mixed erosive and sclerotic changes to the second metatarsal joint likely due to neuropathic joint.  There is also advanced degenerative changes of the first metatarsal phalangeal joint and neuro pathic changes to the tibiotalar joint and talocalcaneal joint.  He also does have pain over the Achilles tendon without defect.  He has a negative Thompson test and defect to indicate rupture.  Suspect there is some form of tendinitis involved.  Patient appears to have degenerative changes due to arthritis and diabetes.  He has no evidence of infection at this time.  Placed the patient in a cam walker boot.  Patient does have history of kidney disease to avoid a oral NSAIDs.  Will give Voltaren gel and advised patient to take Tylenol.  Will provide referral over to a podiatrist.  Will have patient follow with PCP in the next 2-3 days to make  sure symptoms are improving.  Patient given strict return precautions and appears safe for discharge.  Final Clinical Impressions(s) / ED Diagnoses   Final diagnoses:  Chronic pain of left ankle    ED Discharge Orders  Ordered    diclofenac sodium (VOLTAREN) 1 % GEL  4 times daily     10/05/17 1646       Trooper, [place] 10/06/17 0102    Dorie Rank, MD 10/08/17 [phone]

## 2017-10-20 ENCOUNTER — Ambulatory Visit (INDEPENDENT_AMBULATORY_CARE_PROVIDER_SITE_OTHER): Payer: No Typology Code available for payment source | Admitting: Podiatry

## 2017-10-20 ENCOUNTER — Ambulatory Visit: Payer: No Typology Code available for payment source

## 2017-10-20 ENCOUNTER — Encounter: Payer: Self-pay | Admitting: Podiatry

## 2017-10-20 DIAGNOSIS — M19072 Primary osteoarthritis, left ankle and foot: Secondary | ICD-10-CM

## 2017-10-20 DIAGNOSIS — M14672 Charcot's joint, left ankle and foot: Secondary | ICD-10-CM

## 2017-10-20 DIAGNOSIS — S93402A Sprain of unspecified ligament of left ankle, initial encounter: Secondary | ICD-10-CM

## 2017-10-20 MED ORDER — DICLOFENAC SODIUM 1 % TD GEL: 2.0000 g | Freq: Four times a day (QID) | TRANSDERMAL | 0 refills | Status: DC

## 2017-10-21 NOTE — Progress Notes (Signed)
Subjective:   Patient ID: Brian [name], male   DOB: 57 y.o.   MRN: [id number]   HPI 57 year old male presents the office with concerns of left ankle pain which worsened about 2 weeks ago.  He had x-rays performed at that time he was given topical Voltaren gel which is been helping.  He denies any recent injury or trauma to his ankle or foot.  He is diabetic he is also got chronic kidney disease and will be starting dialysis next week.  His last A1c was 5.8.  He does have some numbness and tingling to his toes but denies any claudication symptoms.  He does work as a Presenter, broadcasting.  He has no other concerns.   Review of Systems  All other systems reviewed and are negative.  Past Medical History:  Diagnosis Date  . Arthritis   . Bleeding ulcer   . Chronic kidney disease    stage 5  . Complication of anesthesia    " I had a nose bleed a day or two after surgery-it could have been the oxygen"  . Diabetes mellitus without complication ([place])   . Diabetic neuropathy ([place])   . Elevated TSH   . History of kidney stones   . Hypertension   . Stroke [facility])    hemorrhagic 2010  . Tobacco abuse     Past Surgical History:  Procedure Laterality Date  . AV FISTULA PLACEMENT Left 05/16/2017   Procedure: ARTERIOVENOUS (AV) FISTULA CREATION-LEFT;  Surgeon: Angelia Mould, MD;  Location: [place];  Service: Vascular;  Laterality: Left;  . CHOLECYSTECTOMY    . COLONOSCOPY    . PANCREAS SURGERY    . SPLENECTOMY       Current Outpatient Medications:  .  acetaminophen (TYLENOL) 650 MG CR tablet, Take 1,300 mg by mouth every 8 (eight) hours as needed for pain., Disp: , Rfl:  .  amLODipine (NORVASC) 10 MG tablet, Take 10 mg by mouth daily., Disp: , Rfl:  .  Cholecalciferol (VITAMIN D) 2000 units tablet, Take 4,000 Units by mouth daily. , Disp: , Rfl:  .  diclofenac sodium (VOLTAREN) 1 % GEL, Apply 2 g topically 4 (four) times daily., Disp: 100 g, Rfl: 0 .  furosemide (LASIX) 80 MG tablet, Take  80 mg by mouth 2 (two) times daily., Disp: , Rfl:  .  insulin aspart protamine- aspart (NOVOLOG MIX 70/30) (70-30) 100 UNIT/ML injection, Inject 0.05 mLs (5 Units total) into the skin at bedtime., Disp: 10 mL, Rfl: 0 .  Insulin NPH Isophane & Regular (RELION 70/30 [place]), Inject 5-10 Units into the skin at bedtime. , Disp: , Rfl:   Allergies  Allergen Reactions  . Penicillins Hives and Rash  . Nsaids     Due to kidneys  . Aspirin Nausea And Vomiting    Other Reaction: GI Upset    Social History   Socioeconomic History  . Marital status: Widowed    Spouse name: Not on file  . Number of children: Not on file  . Years of education: Not on file  . Highest education level: Not on file  Social Needs  . Financial resource strain: Not on file  . Food insecurity - worry: Not on file  . Food insecurity - inability: Not on file  . Transportation needs - medical: Not on file  . Transportation needs - non-medical: Not on file  Occupational History  . Not on file  Tobacco Use  . Smoking status: Current Every Day  Smoker    Packs/day: 1.00    Types: Cigarettes  . Smokeless tobacco: Never Used  Substance and Sexual Activity  . Alcohol use: No  . Drug use: No  . Sexual activity: Not on file  Other Topics Concern  . Not on file  Social History Narrative  . Not on file        Objective:  Physical Exam  General: AAO x3, NAD  Dermatological: Skin is warm, dry and supple bilateral.  There are no open sores, no preulcerative lesions, no rash or signs of infection present.  Vascular: Dorsalis Pedis artery and Posterior Tibial artery pedal pulses are 2/4 bilateral with immedate capillary fill time.  There is no pain with calf compression, swelling, warmth, erythema.   Neruologic: Sensation mildly decreased with Derrel Nip monofilament of the digits.  Musculoskeletal: There is decreased range of motion of the ankle joint the left side compared to the contralateral extremity as well as  the subtalar joint.  Mild diffuse tenderness to the ankle joint itself.  There is mild edema to the ankle but there is no erythema or increase in warmth.  There is limitation with ankle joint range of motion.  There is no other area of pinpoint tenderness there is no pain to vibratory sensation.  Muscular strength 5/5 in all groups tested bilateral.  Gait: Unassisted, Nonantalgic.       Assessment:   Severe arthritic changes present left ankle, subtalar, midfoot     Plan:  -Treatment options discussed including all alternatives, risks, and complications -Etiology of symptoms were discussed -X-rays from the ER were evaluated and discussed with the patient.  Showed him the x-rays today. -Ultimately he has severe arthritic changes, possible Charcot to the ankle joint and midfoot.  At this point I believe that he would benefit from an [place] AFO brace.  We will start the precertification process and I will have him come in to see Liliane Channel or [facility] for molding of the brace.  Long-term he may need surgical intervention but given his other comorbidities and no treatment we will hold off on that. -Refill Voltaren gel.  This is been helping. -I will see him after he gets the brace or sooner if needed. -He agrees this plan he has no further questions or concerns today.  Trula Slade DPM

## 2017-10-24 DIAGNOSIS — Z515 Encounter for palliative care: Secondary | ICD-10-CM | POA: Insufficient documentation

## 2017-10-24 DIAGNOSIS — N189 Chronic kidney disease, unspecified: Secondary | ICD-10-CM | POA: Insufficient documentation

## 2017-10-24 DIAGNOSIS — R0602 Shortness of breath: Secondary | ICD-10-CM | POA: Insufficient documentation

## 2017-10-24 DIAGNOSIS — L299 Pruritus, unspecified: Secondary | ICD-10-CM | POA: Insufficient documentation

## 2017-10-24 DIAGNOSIS — N2581 Secondary hyperparathyroidism of renal origin: Secondary | ICD-10-CM | POA: Insufficient documentation

## 2017-10-24 DIAGNOSIS — Z992 Dependence on renal dialysis: Secondary | ICD-10-CM | POA: Insufficient documentation

## 2017-10-27 ENCOUNTER — Telehealth: Payer: Self-pay | Admitting: Podiatry

## 2017-10-27 NOTE — Telephone Encounter (Signed)
I'm a fairly new pt and I was only in for one visit. I need my records from that visit where I'm trying to file for disability and medicare. I wanted to make sure I have everything together for that appointment. If you would please call me back at [phone]. Thank you.

## 2017-10-27 NOTE — Telephone Encounter (Signed)
Called pt and left a voicemail in regards to message he left this morning requesting his medical records from his visit with [place]. I told him he would need to sign a medical records release form authorizing [place] to release his records and that if he was still coming in tomorrow he could do it then or we could always e-mail it, fax it, or send it via postage mail. I asked him to call me back at [phone] and let me know how he wanted to proceed and that if I did not answer to leave me a message and I would call him back.

## 2017-10-28 ENCOUNTER — Ambulatory Visit: Payer: No Typology Code available for payment source | Admitting: Orthotics

## 2017-10-28 DIAGNOSIS — E0841 Diabetes mellitus due to underlying condition with diabetic mononeuropathy: Secondary | ICD-10-CM

## 2017-10-28 NOTE — Progress Notes (Signed)
Discussed financial situation with patient.  He would like to wait for casting until his Medicare disability kicks in (March/April)

## 2017-10-29 ENCOUNTER — Encounter (HOSPITAL_COMMUNITY): Payer: Self-pay

## 2017-10-29 ENCOUNTER — Ambulatory Visit: Payer: No Typology Code available for payment source | Admitting: Vascular Surgery

## 2017-10-30 DIAGNOSIS — F17200 Nicotine dependence, unspecified, uncomplicated: Secondary | ICD-10-CM | POA: Insufficient documentation

## 2017-10-30 DIAGNOSIS — N059 Unspecified nephritic syndrome with unspecified morphologic changes: Secondary | ICD-10-CM | POA: Insufficient documentation

## 2017-10-30 DIAGNOSIS — Z9081 Acquired absence of spleen: Secondary | ICD-10-CM | POA: Insufficient documentation

## 2017-11-11 DIAGNOSIS — E46 Unspecified protein-calorie malnutrition: Secondary | ICD-10-CM | POA: Insufficient documentation

## 2017-11-18 DIAGNOSIS — D689 Coagulation defect, unspecified: Secondary | ICD-10-CM | POA: Insufficient documentation

## 2017-11-21 DIAGNOSIS — Z0279 Encounter for issue of other medical certificate: Secondary | ICD-10-CM | POA: Diagnosis not present

## 2017-11-24 ENCOUNTER — Telehealth: Payer: Self-pay | Admitting: Podiatry

## 2017-11-24 NOTE — Telephone Encounter (Signed)
Entered on error.

## 2017-11-25 DIAGNOSIS — D509 Iron deficiency anemia, unspecified: Secondary | ICD-10-CM | POA: Insufficient documentation

## 2017-11-27 ENCOUNTER — Ambulatory Visit (HOSPITAL_COMMUNITY)
Admission: RE | Admit: 2017-11-27 | Discharge: 2017-11-27 | Disposition: A | Discharge status: Home / Self Care | Payer: Medicaid Other | Source: Ambulatory Visit | Attending: Vascular Surgery | Admitting: Vascular Surgery

## 2017-11-27 DIAGNOSIS — I8392 Asymptomatic varicose veins of left lower extremity: Secondary | ICD-10-CM | POA: Insufficient documentation

## 2017-11-27 DIAGNOSIS — N185 Chronic kidney disease, stage 5: Secondary | ICD-10-CM | POA: Diagnosis present

## 2017-11-27 DIAGNOSIS — Z992 Dependence on renal dialysis: Secondary | ICD-10-CM | POA: Diagnosis not present

## 2017-12-03 ENCOUNTER — Other Ambulatory Visit: Payer: Self-pay

## 2017-12-03 ENCOUNTER — Ambulatory Visit (INDEPENDENT_AMBULATORY_CARE_PROVIDER_SITE_OTHER): Payer: Medicaid Other | Admitting: Vascular Surgery

## 2017-12-03 ENCOUNTER — Encounter: Payer: Self-pay | Admitting: Vascular Surgery

## 2017-12-03 VITALS — BP 155/96 | HR 92 | Temp 98.3°F | Resp 16 | Ht 69.0 in | Wt 168.0 lb

## 2017-12-03 DIAGNOSIS — N185 Chronic kidney disease, stage 5: Secondary | ICD-10-CM

## 2017-12-03 NOTE — Progress Notes (Signed)
Patient name: Brian [name] MRN: [id number] DOB: 1961-04-24 Sex: male  REASON FOR VISIT:   Follow-up of left radiocephalic AV fistula  HPI:   Brian [name] is a pleasant 57 y.o. male who I last saw on 08/27/2017.  Patient had a left radiocephalic fistula placed on 05/16/2017.  At the time of his last visit, the diameters of the fistula ranged from 0.27-0.56.  This was gradually improving in size.  There were a couple areas of narrowing.  I plan on seeing him back in 2 months.  If the fistula was not maturing that we would need to consider a fistulogram.  They have been using his fistula and he denies pain or paresthesias in the left arm.  Current Outpatient Medications  Medication Sig Dispense Refill  . acetaminophen (TYLENOL) 650 MG CR tablet Take 1,300 mg by mouth every 8 (eight) hours as needed for pain.    Marland Kitchen amLODipine (NORVASC) 10 MG tablet Take 10 mg by mouth daily.    . Cholecalciferol (VITAMIN D) 2000 units tablet Take 4,000 Units by mouth daily.     . Insulin NPH Isophane & Regular (RELION 70/30 [place]) Inject 5-10 Units into the skin at bedtime.     . diclofenac sodium (VOLTAREN) 1 % GEL Apply 2 g topically 4 (four) times daily. (Patient not taking: Reported on 12/03/2017) 100 g 0  . furosemide (LASIX) 80 MG tablet Take 80 mg by mouth 2 (two) times daily.    . insulin aspart protamine- aspart (NOVOLOG MIX 70/30) (70-30) 100 UNIT/ML injection Inject 0.05 mLs (5 Units total) into the skin at bedtime. (Patient not taking: Reported on 12/03/2017) 10 mL 0   No current facility-administered medications for this visit.     REVIEW OF SYSTEMS:  [X]  denotes positive finding, [ ]  denotes negative finding Cardiac  Comments:  Chest pain or chest pressure:    Shortness of breath upon exertion:    Short of breath when lying flat:    Irregular heart rhythm:    Constitutional    Fever or chills:     PHYSICAL EXAM:   Vitals:   12/03/17 0826 12/03/17 0830  BP: (!) 157/94 (!) 155/96  Pulse:  91 92  Resp: 16   Temp: 98.3 F (36.8 C)   TempSrc: Oral   SpO2: 100%   Weight: 168 lb (76.2 kg)   Height: 5\' 9"  (1.753 m)     GENERAL: The patient is a well-nourished male, in no acute distress. The vital signs are documented above. CARDIOVASCULAR: There is a regular rate and rhythm. PULMONARY: There is good air exchange bilaterally without wheezing or rales. He has a good thrill in his left forearm AV fistula.  DATA:   DUPLEX LEFT RADIOCEPHALIC AV FISTULA: I reviewed the duplex of the left radiocephalic fistula that was done on 11/27/2017.  This shows that the diameters of the fistula range from 0.4-0.6 cm.  Thus the fistula has continued to gradually increase in size.  There are some elevated velocities in the native artery at the inflow and in the distal forearm and mid forearm.  MEDICAL ISSUES:   STATUS POST LEFT RADIOCEPHALIC AV FISTULA: Since I saw him last, they have been using his fistula and his catheter has been removed.  Therefore, we will hold off on a fistulogram.  If he has problems with the fistula, given that he is now on dialysis, certainly we could proceed with a fistulogram.  However currently the fistula is reportedly working well.  HYPERTENSION: The patient's initial blood pressure today was elevated. We repeated this and this was still elevated. We have encouraged the patient to follow up with their primary care physician for management of their blood pressure.   Deitra [facility] [phone]

## 2017-12-03 NOTE — Progress Notes (Signed)
Vitals:   12/03/17 0826  BP: (!) 157/94  Pulse: 91  Resp: 16  Temp: 98.3 F (36.8 C)  TempSrc: Oral  SpO2: 100%  Weight: 168 lb (76.2 kg)  Height: 5\' 9"  (1.753 m)

## 2017-12-12 DIAGNOSIS — M79676 Pain in unspecified toe(s): Secondary | ICD-10-CM

## 2018-01-15 DIAGNOSIS — Z8673 Personal history of transient ischemic attack (TIA), and cerebral infarction without residual deficits: Secondary | ICD-10-CM | POA: Insufficient documentation

## 2018-01-21 ENCOUNTER — Encounter (HOSPITAL_COMMUNITY): Payer: Self-pay

## 2018-01-21 ENCOUNTER — Inpatient Hospital Stay (HOSPITAL_COMMUNITY)
Admission: EM | Admit: 2018-01-21 | Discharge: 2018-01-25 | DRG: 377 | Disposition: A | Discharge status: Home / Self Care | Payer: Medicare Other | Attending: Internal Medicine | Admitting: Internal Medicine

## 2018-01-21 ENCOUNTER — Other Ambulatory Visit: Payer: Self-pay

## 2018-01-21 ENCOUNTER — Encounter: Payer: Self-pay | Admitting: Physician Assistant

## 2018-01-21 DIAGNOSIS — K922 Gastrointestinal hemorrhage, unspecified: Secondary | ICD-10-CM | POA: Diagnosis not present

## 2018-01-21 DIAGNOSIS — K2970 Gastritis, unspecified, without bleeding: Secondary | ICD-10-CM | POA: Diagnosis present

## 2018-01-21 DIAGNOSIS — E1029 Type 1 diabetes mellitus with other diabetic kidney complication: Secondary | ICD-10-CM | POA: Diagnosis present

## 2018-01-21 DIAGNOSIS — K269 Duodenal ulcer, unspecified as acute or chronic, without hemorrhage or perforation: Secondary | ICD-10-CM | POA: Diagnosis present

## 2018-01-21 DIAGNOSIS — Z992 Dependence on renal dialysis: Secondary | ICD-10-CM

## 2018-01-21 DIAGNOSIS — I248 Other forms of acute ischemic heart disease: Secondary | ICD-10-CM | POA: Diagnosis present

## 2018-01-21 DIAGNOSIS — D649 Anemia, unspecified: Secondary | ICD-10-CM

## 2018-01-21 DIAGNOSIS — Z8673 Personal history of transient ischemic attack (TIA), and cerebral infarction without residual deficits: Secondary | ICD-10-CM

## 2018-01-21 DIAGNOSIS — F17210 Nicotine dependence, cigarettes, uncomplicated: Secondary | ICD-10-CM | POA: Diagnosis present

## 2018-01-21 DIAGNOSIS — Z8249 Family history of ischemic heart disease and other diseases of the circulatory system: Secondary | ICD-10-CM

## 2018-01-21 DIAGNOSIS — I953 Hypotension of hemodialysis: Secondary | ICD-10-CM | POA: Diagnosis not present

## 2018-01-21 DIAGNOSIS — K264 Chronic or unspecified duodenal ulcer with hemorrhage: Secondary | ICD-10-CM | POA: Diagnosis not present

## 2018-01-21 DIAGNOSIS — K449 Diaphragmatic hernia without obstruction or gangrene: Secondary | ICD-10-CM | POA: Diagnosis present

## 2018-01-21 DIAGNOSIS — N186 End stage renal disease: Secondary | ICD-10-CM | POA: Diagnosis present

## 2018-01-21 DIAGNOSIS — E1022 Type 1 diabetes mellitus with diabetic chronic kidney disease: Secondary | ICD-10-CM | POA: Diagnosis present

## 2018-01-21 DIAGNOSIS — I120 Hypertensive chronic kidney disease with stage 5 chronic kidney disease or end stage renal disease: Secondary | ICD-10-CM | POA: Diagnosis present

## 2018-01-21 DIAGNOSIS — Z90411 Acquired partial absence of pancreas: Secondary | ICD-10-CM

## 2018-01-21 DIAGNOSIS — I252 Old myocardial infarction: Secondary | ICD-10-CM

## 2018-01-21 DIAGNOSIS — E1040 Type 1 diabetes mellitus with diabetic neuropathy, unspecified: Secondary | ICD-10-CM | POA: Diagnosis present

## 2018-01-21 DIAGNOSIS — E8889 Other specified metabolic disorders: Secondary | ICD-10-CM | POA: Diagnosis present

## 2018-01-21 DIAGNOSIS — D631 Anemia in chronic kidney disease: Secondary | ICD-10-CM | POA: Diagnosis present

## 2018-01-21 DIAGNOSIS — Z794 Long term (current) use of insulin: Secondary | ICD-10-CM

## 2018-01-21 DIAGNOSIS — D62 Acute posthemorrhagic anemia: Secondary | ICD-10-CM | POA: Diagnosis present

## 2018-01-21 DIAGNOSIS — N2581 Secondary hyperparathyroidism of renal origin: Secondary | ICD-10-CM | POA: Diagnosis present

## 2018-01-21 DIAGNOSIS — E785 Hyperlipidemia, unspecified: Secondary | ICD-10-CM | POA: Diagnosis present

## 2018-01-21 DIAGNOSIS — Z833 Family history of diabetes mellitus: Secondary | ICD-10-CM

## 2018-01-21 DIAGNOSIS — Z9081 Acquired absence of spleen: Secondary | ICD-10-CM

## 2018-01-21 LAB — CBC WITH DIFFERENTIAL/PLATELET
BASOS PCT: 0 %
Basophils Absolute: 0.1 10*3/uL (ref 0.0–0.1)
Eosinophils Absolute: 0 10*3/uL (ref 0.0–0.7)
Eosinophils Relative: 0 %
HEMATOCRIT: 28.3 % — AB (ref 39.0–52.0)
HEMOGLOBIN: 9.2 g/dL — AB (ref 13.0–17.0)
LYMPHS ABS: 5.9 10*3/uL — AB (ref 0.7–4.0)
Lymphocytes Relative: 35 %
MCH: 32.1 pg (ref 26.0–34.0)
MCHC: 32.5 g/dL (ref 30.0–36.0)
MCV: 98.6 fL (ref 78.0–100.0)
MONO ABS: 1.6 10*3/uL — AB (ref 0.1–1.0)
Monocytes Relative: 10 %
NEUTROS ABS: 9.3 10*3/uL — AB (ref 1.7–7.7)
Neutrophils Relative %: 55 %
Platelets: 291 10*3/uL (ref 150–400)
RBC: 2.87 MIL/uL — ABNORMAL LOW (ref 4.22–5.81)
RDW: 13.4 % (ref 11.5–15.5)
WBC: 16.9 10*3/uL — ABNORMAL HIGH (ref 4.0–10.5)

## 2018-01-21 LAB — COMPREHENSIVE METABOLIC PANEL
ALK PHOS: 198 U/L — AB (ref 38–126)
ALT: 39 U/L (ref 17–63)
ANION GAP: 16 — AB (ref 5–15)
AST: 48 U/L — ABNORMAL HIGH (ref 15–41)
Albumin: 3.2 g/dL — ABNORMAL LOW (ref 3.5–5.0)
BUN: 85 mg/dL — ABNORMAL HIGH (ref 6–20)
CALCIUM: 8.7 mg/dL — AB (ref 8.9–10.3)
CHLORIDE: 91 mmol/L — AB (ref 101–111)
CO2: 23 mmol/L (ref 22–32)
Creatinine, Ser: 5.48 mg/dL — ABNORMAL HIGH (ref 0.61–1.24)
GFR calc Af Amer: 12 mL/min — ABNORMAL LOW (ref >60)
GFR calc non Af Amer: 11 mL/min — ABNORMAL LOW (ref >60)
GLUCOSE: 199 mg/dL — AB (ref 65–99)
Potassium: 4.7 mmol/L (ref 3.5–5.1)
SODIUM: 130 mmol/L — AB (ref 135–145)
Total Bilirubin: 0.5 mg/dL (ref 0.3–1.2)
Total Protein: 6.5 g/dL (ref 6.5–8.1)

## 2018-01-21 NOTE — ED Triage Notes (Signed)
Per [facility], pt from home with complaint of dizziness and orthostatic hypotension. Mon-wed-fri dialysis pt. Pt went to Dialysis today and his BP 329 systolic(which is low for pt) then during dialysis pt became extremely dizzy and dialysis was stopped half the way through. BP drops when standing. Pt received 570ml bolus with ems and dizziness improved. VS 140/90, HR 100, RR 16, spo2 100%, CBG 271. Axox4.

## 2018-01-21 NOTE — ED Notes (Signed)
During orthostatics pt endorses some very mild dizziness but states "it's nothing like it was" No neuro deficits.

## 2018-01-22 ENCOUNTER — Encounter (HOSPITAL_COMMUNITY): Payer: Self-pay | Admitting: Internal Medicine

## 2018-01-22 DIAGNOSIS — Z992 Dependence on renal dialysis: Secondary | ICD-10-CM | POA: Diagnosis not present

## 2018-01-22 DIAGNOSIS — D62 Acute posthemorrhagic anemia: Secondary | ICD-10-CM | POA: Diagnosis present

## 2018-01-22 DIAGNOSIS — K269 Duodenal ulcer, unspecified as acute or chronic, without hemorrhage or perforation: Secondary | ICD-10-CM | POA: Diagnosis not present

## 2018-01-22 DIAGNOSIS — Z9081 Acquired absence of spleen: Secondary | ICD-10-CM | POA: Diagnosis not present

## 2018-01-22 DIAGNOSIS — I120 Hypertensive chronic kidney disease with stage 5 chronic kidney disease or end stage renal disease: Secondary | ICD-10-CM | POA: Diagnosis present

## 2018-01-22 DIAGNOSIS — N2581 Secondary hyperparathyroidism of renal origin: Secondary | ICD-10-CM | POA: Diagnosis present

## 2018-01-22 DIAGNOSIS — K2970 Gastritis, unspecified, without bleeding: Secondary | ICD-10-CM | POA: Diagnosis present

## 2018-01-22 DIAGNOSIS — Z8249 Family history of ischemic heart disease and other diseases of the circulatory system: Secondary | ICD-10-CM | POA: Diagnosis not present

## 2018-01-22 DIAGNOSIS — E1022 Type 1 diabetes mellitus with diabetic chronic kidney disease: Secondary | ICD-10-CM | POA: Diagnosis present

## 2018-01-22 DIAGNOSIS — Z833 Family history of diabetes mellitus: Secondary | ICD-10-CM | POA: Diagnosis not present

## 2018-01-22 DIAGNOSIS — Z90411 Acquired partial absence of pancreas: Secondary | ICD-10-CM | POA: Diagnosis not present

## 2018-01-22 DIAGNOSIS — K922 Gastrointestinal hemorrhage, unspecified: Secondary | ICD-10-CM | POA: Diagnosis present

## 2018-01-22 DIAGNOSIS — I248 Other forms of acute ischemic heart disease: Secondary | ICD-10-CM | POA: Diagnosis present

## 2018-01-22 DIAGNOSIS — Z794 Long term (current) use of insulin: Secondary | ICD-10-CM | POA: Diagnosis not present

## 2018-01-22 DIAGNOSIS — F17210 Nicotine dependence, cigarettes, uncomplicated: Secondary | ICD-10-CM | POA: Diagnosis present

## 2018-01-22 DIAGNOSIS — K449 Diaphragmatic hernia without obstruction or gangrene: Secondary | ICD-10-CM | POA: Diagnosis present

## 2018-01-22 DIAGNOSIS — E785 Hyperlipidemia, unspecified: Secondary | ICD-10-CM | POA: Diagnosis present

## 2018-01-22 DIAGNOSIS — E1040 Type 1 diabetes mellitus with diabetic neuropathy, unspecified: Secondary | ICD-10-CM | POA: Diagnosis present

## 2018-01-22 DIAGNOSIS — N186 End stage renal disease: Secondary | ICD-10-CM | POA: Diagnosis present

## 2018-01-22 DIAGNOSIS — I953 Hypotension of hemodialysis: Secondary | ICD-10-CM | POA: Diagnosis not present

## 2018-01-22 DIAGNOSIS — D631 Anemia in chronic kidney disease: Secondary | ICD-10-CM | POA: Diagnosis present

## 2018-01-22 DIAGNOSIS — Z8673 Personal history of transient ischemic attack (TIA), and cerebral infarction without residual deficits: Secondary | ICD-10-CM | POA: Diagnosis not present

## 2018-01-22 DIAGNOSIS — K264 Chronic or unspecified duodenal ulcer with hemorrhage: Secondary | ICD-10-CM | POA: Diagnosis present

## 2018-01-22 DIAGNOSIS — E8889 Other specified metabolic disorders: Secondary | ICD-10-CM | POA: Diagnosis present

## 2018-01-22 DIAGNOSIS — I252 Old myocardial infarction: Secondary | ICD-10-CM | POA: Diagnosis not present

## 2018-01-22 LAB — POC OCCULT BLOOD, ED: FECAL OCCULT BLD: POSITIVE — AB

## 2018-01-22 LAB — CBG MONITORING, ED: Glucose-Capillary: 140 mg/dL — ABNORMAL HIGH (ref 65–99)

## 2018-01-22 LAB — CBC
HEMATOCRIT: 25.7 % — AB (ref 39.0–52.0)
HEMOGLOBIN: 8.4 g/dL — AB (ref 13.0–17.0)
MCH: 32.6 pg (ref 26.0–34.0)
MCHC: 32.7 g/dL (ref 30.0–36.0)
MCV: 99.6 fL (ref 78.0–100.0)
Platelets: 296 10*3/uL (ref 150–400)
RBC: 2.58 MIL/uL — ABNORMAL LOW (ref 4.22–5.81)
RDW: 13.4 % (ref 11.5–15.5)
WBC: 15.8 10*3/uL — ABNORMAL HIGH (ref 4.0–10.5)

## 2018-01-22 LAB — TYPE AND SCREEN
ABO/RH(D): O POS
ANTIBODY SCREEN: NEGATIVE

## 2018-01-22 LAB — HEMOGLOBIN A1C
Hgb A1c MFr Bld: 6 % — ABNORMAL HIGH (ref 4.8–5.6)
Mean Plasma Glucose: 125.5 mg/dL

## 2018-01-22 LAB — TROPONIN I
Troponin I: 0.06 ng/mL (ref <0.03)
Troponin I: 0.06 ng/mL (ref <0.03)

## 2018-01-22 LAB — SAMPLE TO BLOOD BANK

## 2018-01-22 LAB — ABO/RH: ABO/RH(D): O POS

## 2018-01-22 LAB — GLUCOSE, CAPILLARY: Glucose-Capillary: 131 mg/dL — ABNORMAL HIGH (ref 65–99)

## 2018-01-22 MED ORDER — FAMOTIDINE IN NACL 20-0.9 MG/50ML-% IV SOLN
20.0000 mg | Freq: Two times a day (BID) | INTRAVENOUS | Status: DC
  Administered 2018-01-22: 20 mg via INTRAVENOUS
  Filled 2018-01-22: qty 50

## 2018-01-22 MED ORDER — SODIUM CHLORIDE 0.9% FLUSH
3.0000 mL | Freq: Two times a day (BID) | INTRAVENOUS | Status: DC
  Administered 2018-01-22 – 2018-01-25 (×5): 3 mL via INTRAVENOUS

## 2018-01-22 MED ORDER — PANTOPRAZOLE SODIUM 40 MG PO TBEC
40.0000 mg | DELAYED_RELEASE_TABLET | Freq: Two times a day (BID) | ORAL | Status: DC
  Administered 2018-01-22: 40 mg via ORAL
  Filled 2018-01-22: qty 1

## 2018-01-22 NOTE — ED Notes (Signed)
Tray delivered

## 2018-01-22 NOTE — ED Provider Notes (Signed)
[facility] EMERGENCY DEPARTMENT Provider Note   CSN: [id number] Arrival date & time: 01/21/18  2210     History   Chief Complaint Chief Complaint  Patient presents with  . Hypotension    HPI Brian [name] is a 57 y.o. male.  The history is provided by the patient. No language interpreter was used.  Weakness  Primary symptoms include dizziness. This is a new problem. The current episode started yesterday. The problem has been gradually worsening. There was no focality noted. There has been no fever. Pertinent negatives include no shortness of breath. There were no medications administered prior to arrival.  Pt reports dark stool x 1 on Wednesday. Pt vomited dark material on Tuesday.  Pt became weak and has decreased blood pressure at dialysis today.  Dialysis was not completed.   Past Medical History:  Diagnosis Date  . Arthritis   . Bleeding ulcer   . Chronic kidney disease    stage 5  . Complication of anesthesia    " I had a nose bleed a day or two after surgery-it could have been the oxygen"  . Diabetes mellitus without complication ([place])   . Diabetic neuropathy ([place])   . Elevated TSH   . History of kidney stones   . Hypertension   . Stroke [facility])    hemorrhagic 2010  . Tobacco abuse     Patient Active Problem List   Diagnosis Date Noted  . Type 1 diabetes mellitus with kidney complication ([place]) - on insulin 05/01/2017  . Hemorrhagic stroke [facility])- Jan 2010 05/01/2017  . H/O hyperlipidemia- sev yrs ago 05/01/2017  . h/o Hypertriglyceridemia- 13000's in past 05/01/2017  . H/O splenectomy 05/01/2017  . Status post cholecystectomy 05/01/2017  . Gout- L foot- mult times 05/01/2017  . Diabetic neuropathy ([place])- 6 mo or so; never started meds 05/01/2017  . Vitamin D deficiency 05/01/2017  . Chronic kidney disease, stage V (very severe) ([place]) 02/05/2017  . Anasarca- may 2018 hosp 02/05/2017  . Leukocytosis- s/p plenectomy 02/05/2017  .  Hypertension   . Diabetes mellitus without complication ([place])   . Tobacco abuse- >er than 55 pack yr hx; current smoker     Past Surgical History:  Procedure Laterality Date  . AV FISTULA PLACEMENT Left 05/16/2017   Procedure: ARTERIOVENOUS (AV) FISTULA CREATION-LEFT;  Surgeon: Angelia Mould, MD;  Location: [facility];  Service: Vascular;  Laterality: Left;  . CHOLECYSTECTOMY    . COLONOSCOPY    . PANCREAS SURGERY    . SPLENECTOMY          Home Medications    Prior to Admission medications   Medication Sig Start Date End Date Taking? Authorizing Provider  acetaminophen (TYLENOL) 500 MG tablet Take 1,000 mg by mouth daily as needed for mild pain.   Yes [provider]  amLODipine (NORVASC) 10 MG tablet Take 10 mg by mouth daily.   Yes [provider]  Cholecalciferol (VITAMIN D) 2000 units tablet Take 4,000 Units by mouth daily.    Yes [provider]  insulin aspart protamine- aspart (NOVOLOG MIX 70/30) (70-30) 100 UNIT/ML injection Inject 0.05 mLs (5 Units total) into the skin at bedtime. Patient taking differently: Inject 5-10 Units into the skin at bedtime. Depending on blood sugar. 02/07/17  Yes Eugenie Filler, MD  diclofenac sodium (VOLTAREN) 1 % GEL Apply 2 g topically 4 (four) times daily. Patient not taking: Reported on 12/03/2017 10/20/17   Trula Slade, DPM  Family History Family History  Problem Relation Age of Onset  . Lymphoma Mother   . Hypertension Father   . Diabetes Mellitus II Father   . Hypertension Brother   . Diabetes Mellitus I Brother     Social History Social History   Tobacco Use  . Smoking status: Current Every Day Smoker    Packs/day: 1.00    Types: Cigarettes  . Smokeless tobacco: Never Used  Substance Use Topics  . Alcohol use: No  . Drug use: No     Allergies   Penicillins; Nsaids; and Aspirin   Review of Systems Review of Systems  Respiratory: Negative for shortness of breath.     Neurological: Positive for dizziness and weakness.  All other systems reviewed and are negative.    Physical Exam Updated Vital Signs BP 134/73   Pulse 98   Temp 98.7 F (37.1 C)   Resp 16   SpO2 100%   Physical Exam  Constitutional: He appears well-developed and well-nourished.  HENT:  Head: Normocephalic and atraumatic.  Mouth/Throat: Oropharynx is clear and moist.  Eyes: Conjunctivae are normal.  Neck: Neck supple.  Cardiovascular: Normal rate and regular rhythm.  No murmur heard. Pulmonary/Chest: Effort normal and breath sounds normal. No respiratory distress.  Abdominal: Soft. There is no tenderness.  Genitourinary: Rectum normal.  Musculoskeletal: Normal range of motion. He exhibits no edema.  Neurological: He is alert.  Skin: Skin is warm and dry.  Psychiatric: He has a normal mood and affect.  Nursing note and vitals reviewed.    ED Treatments / Results  Labs (all labs ordered are listed, but only abnormal results are displayed) Labs Reviewed  COMPREHENSIVE METABOLIC PANEL - Abnormal; Notable for the following components:      Result Value   Sodium 130 (*)    Chloride 91 (*)    Glucose, Bld 199 (*)    BUN 85 (*)    Creatinine, Ser 5.48 (*)    Calcium 8.7 (*)    Albumin 3.2 (*)    AST 48 (*)    Alkaline Phosphatase 198 (*)    GFR calc non Af Amer 11 (*)    GFR calc Af Amer 12 (*)    Anion gap 16 (*)    All other components within normal limits  CBC WITH DIFFERENTIAL/PLATELET - Abnormal; Notable for the following components:   WBC 16.9 (*)    RBC 2.87 (*)    Hemoglobin 9.2 (*)    HCT 28.3 (*)    Neutro Abs 9.3 (*)    Lymphs Abs 5.9 (*)    Monocytes Absolute 1.6 (*)    All other components within normal limits  CBC - Abnormal; Notable for the following components:   WBC 15.8 (*)    RBC 2.58 (*)    Hemoglobin 8.4 (*)    HCT 25.7 (*)    All other components within normal limits  POC OCCULT BLOOD, ED - Abnormal; Notable for the following  components:   Fecal Occult Bld POSITIVE (*)    All other components within normal limits  TROPONIN I  OCCULT BLOOD X 1 CARD TO LAB, STOOL  POC OCCULT BLOOD, ED  SAMPLE TO BLOOD BANK    EKG EKG Interpretation  Date/Time:  Wednesday January 21 2018 22:24:46 EDT Ventricular Rate:  104 PR Interval:  148 QRS Duration: 88 QT Interval:  392 QTC Calculation: 515 R Axis:   -10 Text Interpretation:  Sinus tachycardia with Premature atrial complexes T wave  abnormality, consider lateral ischemia Abnormal ECG No significant change since last tracing Confirmed by Duffy Bruce [phone]) on 01/22/2018 7:01:02 AM   Radiology No results found.  Procedures Procedures (including critical care time)  Medications Ordered in ED Medications - No data to display   Initial Impression / Assessment and Plan / ED Course  I have reviewed the triage vital signs and the nursing notes.  Pertinent labs & imaging results that were available during my care of the patient were reviewed by me and considered in my medical decision making (see chart for details).  Clinical Course as of Jan 23 1140  Thu Jan 22, 2018  1137 MCHC: 32.7 [TF]    Clinical Course User Index [TF] Jerilynn Birkenhead, Student-PA   Pt had hemoglobin of 13.8 on 4/11 at [facility].  Pt's initial hemoglobin 9.2 dropped to 8.4.   Final Clinical Impressions(s) / ED Diagnoses   Final diagnoses:  Anemia, unspecified type  Gastrointestinal hemorrhage, unspecified gastrointestinal hemorrhage type    ED Discharge Orders    None    I spoke to the Internal Medicine Teaching Service.   They will see pt for admission.   Fransico Meadow, PA-C 01/22/18 1224    Duffy Bruce, MD 01/22/18 1538

## 2018-01-22 NOTE — Consult Note (Addendum)
[facility] Consult: 2:10 PM 01/22/2018  LOS: 0 days    Referring Provider: DR Trilby Drummer from teading service.    Primary Care Physician:  [name], MD Primary Gastroenterologist:  unassigned .  Seen by [facility] as inpt many years ago, but no regular pt of [facility].      Reason for Consultation: Dark emesis, dark stools, acute anemia.   HPI: Brian [name] is a 57 y.o. male.   Hx MI 2004, cath with no significant CAD.  Gout. Kidney and ureteral stones.  IDDM.  AKI 2006, 02/2017.  ESRD, began hemodialysis in 10/2017.  Left UE AV fistula placed 05/2017.  Hemorrhagic CVA 2010.   Gallstone pancreatitis in 2004.  13 x 12 cm pancreatic pseudocyst in tail found on CT performed for eval of kidney stones.  Had had no preceeding biliary or pancreatic issues. Pancreatic pseudocyst drainage and splenectomy in 12/1999.  5 days post splenectomy had acute GIB and EGD revealed pulsatile bleeding ulcer in duodenum treated with epi and cautery.   ERCP in late 2001 at [place]: recurrent pseudocyst.  S/p distal pancreatectomy to address chronic pancreatic fistula 11/2000.  Path report showing chronic fibrosing pancreatitis, portions of muscularis propria with fistula tract.  Recurrent pseudocysts post pancreatectomy required IR to place drains in 01/2001, amylase in fluid 13,000.  02/2011 recurrent GI bleed with Hgb 6:  Transfused and EGD showed HH and non-bleeding DU. Was using Vioxx at the time.   Lap chole 08/2003.    Patient was in his usual state of health without significant GI problems.  He rarely gets reflux disease or nausea.  He is not taking any PPI.  Not using NSAIDs or aspirin products and not on blood thinners. 3 days ago, Tuesday, he ate some sausage and vomited what looked like dark material.  The next morning, yesterday, before  dialysis he ate eggs and grits and vomited at the dialysis center.  The vomit looked like partially digested eggs and grits.  That night he ate dinner without consequence but he did have a dark stool and he had another one this morning.  Starting last night he was feeling dizzy and his brother checked his blood pressure.  He does not remember exact numbers but he does know that the blood pressure dropped when he stood up.  He has not had any chest pain or syncope.  No abdominal pain. He presented to the ED today because of the dizziness.  BP fluctuating 105 -148/72-92.  HR 90s to 102. Hgb 12.2 in 05/2017 >> 9.2 yesterday >>  8.4 at 10 AM today.  MCV 99.  And creatinine are markedly deranged but he is a dialysis patient.  Alkaline phosphatase is elevated.  There is mild elevation of his AST.  Troponin 0.06.  WBCs of 15.8. The nausea has not returned, in fact within the last half hour he has eaten a small amount of solid food.    Raquel Sarna history is negative for GI bleed, anemia, colon cancer, breast cancer.  His mother had lymphoma. Patient is not sure  if he ever had colonoscopy, if he did he said it would have been at a hospital in [place].  Has been told that he will need to have a colonoscopy in order to be processed for kidney transplant.  He is working with doctors at [facility] regarding transplant.   Past Medical History:  Diagnosis Date  . Arthritis   . Bleeding ulcer   . Chronic kidney disease    stage 5  . Complication of anesthesia    " I had a nose bleed a day or two after surgery-it could have been the oxygen"  . Diabetes mellitus without complication ([place])   . Diabetic neuropathy ([place])   . Elevated TSH   . History of kidney stones   . Hypertension   . Stroke [facility])    hemorrhagic 2010  . Tobacco abuse     Past Surgical History:  Procedure Laterality Date  . AV FISTULA PLACEMENT Left 05/16/2017   Procedure: ARTERIOVENOUS (AV) FISTULA CREATION-LEFT;  Surgeon: Angelia Mould, MD;  Location: [facility];  Service: Vascular;  Laterality: Left;  . CHOLECYSTECTOMY    . COLONOSCOPY    . PANCREAS SURGERY    . SPLENECTOMY      Prior to Admission medications   Medication Sig Start Date End Date Taking? Authorizing Provider  acetaminophen (TYLENOL) 500 MG tablet Take 1,000 mg by mouth daily as needed for mild pain.   Yes [provider]  amLODipine (NORVASC) 10 MG tablet Take 10 mg by mouth daily.   Yes [provider]  Cholecalciferol (VITAMIN D) 2000 units tablet Take 4,000 Units by mouth daily.    Yes [provider]  insulin aspart protamine- aspart (NOVOLOG MIX 70/30) (70-30) 100 UNIT/ML injection Inject 0.05 mLs (5 Units total) into the skin at bedtime. Patient taking differently: Inject 5-10 Units into the skin at bedtime. Depending on blood sugar. 02/07/17  Yes Eugenie Filler, MD  diclofenac sodium (VOLTAREN) 1 % GEL Apply 2 g topically 4 (four) times daily. Patient not taking: Reported on 12/03/2017 10/20/17   Trula Slade, DPM    Scheduled Meds:  Infusions: . famotidine (PEPCID) IV     PRN Meds:    Allergies as of 01/21/2018 - Review Complete 01/21/2018  Allergen Reaction Noted  . Penicillins Hives and Rash 06/22/2016  . Nsaids  05/16/2017  . Aspirin Nausea And Vomiting     Family History  Problem Relation Age of Onset  . Lymphoma Mother   . Hypertension Father   . Diabetes Mellitus II Father   . Hypertension Brother   . Diabetes Mellitus I Brother     Social History   Socioeconomic History  . Marital status: Widowed    Spouse name: Not on file  . Number of children: Not on file  . Years of education: Not on file  . Highest education level: Not on file  Occupational History  . Not on file  Social Needs  . Financial resource strain: Not on file  . Food insecurity:    Worry: Not on file    Inability: Not on file  . Transportation needs:    Medical: Not on file    Non-medical: Not on file    Tobacco Use  . Smoking status: Current Every Day Smoker    Packs/day: 1.00    Types: Cigarettes  . Smokeless tobacco: Never Used  Substance and Sexual Activity  . Alcohol use: No  . Drug use: No  . Sexual activity:  Not on file  Lifestyle  . Physical activity:    Days per week: Not on file    Minutes per session: Not on file  . Stress: Not on file  Relationships  . Social connections:    Talks on phone: Not on file    Gets together: Not on file    Attends religious service: Not on file    Active member of club or organization: Not on file    Attends meetings of clubs or organizations: Not on file    Relationship status: Not on file  . Intimate partner violence:    Fear of current or ex partner: Not on file    Emotionally abused: Not on file    Physically abused: Not on file    Forced sexual activity: Not on file  Other Topics Concern  . Not on file  Social History Narrative  . Not on file    REVIEW OF SYSTEMS: Constitutional: Feels weak, feels tired, feels dizzy. ENT:  No nose bleeds Pulm: Shortness of breath.  No cough. CV:  No palpitations, no LE edema.  No chest pain GU:  No hematuria, no frequency.  Still makes urine GI:  Per HPI.  Has not had any troubles with his pancreas other than what is described in the HPI. Heme: Generally does not have issues with unusual bleeding or bruising.  Does not think he is getting any Mircera or other EPO gin type products at dialysis.  Was told he previously had anemia but that it had improved. Transfusions: On occasions many years ago but nothing in the last few years. Neuro:  Dizziness as per HPI, resolves when he sits down.  No headaches, no peripheral tingling or numbness Derm:  No itching, no rash or sores.  Endocrine:  No sweats or chills.  No polyuria or dysuria Immunization: Did not inquire as to recent or past immunizations. Travel:  None beyond local counties in last few months.    PHYSICAL EXAM: Vital signs in last  24 hours: Vitals:   01/22/18 1300 01/22/18 1330  BP: 133/67 131/84  Pulse: 96 100  Resp: (!) 9 17  Temp:    SpO2: 97% 100%   Wt Readings from Last 3 Encounters:  12/03/17 168 lb (76.2 kg)  10/05/17 180 lb (81.6 kg)  08/27/17 183 lb (83 kg)    General: Pleasant, chronically ill-appearing, alert, comfortable WM. Head: No facial asymmetry or swelling. Eyes: No scleral icterus.  Slightly pale conjunctiva.  EOMI. Ears: No hearing deficit Nose: No congestion or discharge. Mouth: Oropharynx moist and clear.  Tongue midline. Neck: No JVD, no masses, no thyromegaly. Lungs: Diminished breath sounds overall but no adventitious sounds.  No dyspnea and no cough. Heart: RRR.  No MRG.  S1, S2 present Abdomen: Soft.  Not tender or distended.  No HSM, hernias, bruits, masses.  All sounds active..   Rectal: Deferred rectal exam.  By earlier DRE he is FOBT positive. Musc/Skeltl: No joint redness, swelling or significant deformity. Extremities: No CCE. Neurologic: Grossly intact.  No tremors, no limb weakness.  Fully alert and oriented.  Good historian. Skin: Open sores, no rashes, no telangiectasia. Tattoos: 1 polyclonal, professional appearing tattoo on the left deltoid. Nodes: No cervical adenopathy Psych: Pleasant, cooperative, calm.  Intake/Output from previous day: No intake/output data recorded. Intake/Output this shift: No intake/output data recorded.  LAB RESULTS: Recent Labs    01/21/18 2230 01/22/18 0951  WBC 16.9* 15.8*  HGB 9.2* 8.4*  HCT 28.3* 25.7*  PLT 291 296   BMET Lab Results  Component Value Date   NA 130 (L) 01/21/2018   NA 138 05/16/2017   NA 137 02/07/2017   K 4.7 01/21/2018   K 3.9 05/16/2017   K 3.8 02/07/2017   CL 91 (L) 01/21/2018   CL 106 02/07/2017   CL 108 02/06/2017   CO2 23 01/21/2018   CO2 19 (L) 02/07/2017   CO2 20 (L) 02/06/2017   GLUCOSE 199 (H) 01/21/2018   GLUCOSE 99 05/16/2017   GLUCOSE 100 (H) 02/07/2017   BUN 85 (H) 01/21/2018    BUN 56 (H) 02/07/2017   BUN 55 (H) 02/06/2017   CREATININE 5.48 (H) 01/21/2018   CREATININE 4.72 (H) 02/07/2017   CREATININE 4.62 (H) 02/06/2017   CALCIUM 8.7 (L) 01/21/2018   CALCIUM 8.1 (L) 02/07/2017   CALCIUM 8.0 (L) 02/06/2017   LFT Recent Labs    01/21/18 2230  PROT 6.5  ALBUMIN 3.2*  AST 48*  ALT 39  ALKPHOS 198*  BILITOT 0.5   PT/INR Lab Results  Component Value Date   INR 1.0 11/04/2008     RADIOLOGY STUDIES: No results found.    IMPRESSION:   *    Upper GI bleed.  Rule out recurrent ulcer disease.  Rule out gastritis.  Rule out AVMs.  History of bleeding duodenal ulcers with associated anemia on EGDs 2001 and 2012.  Not taking PPI or H2 blockers.  *Acute blood loss anemia.  Not in need of transfusion yet.  *   History gallstone pancreatitis and complicated subsequent pseudocysts.   Status post pseudocyst drainage and splenectomy 2001.  Status post distal pancreatectomy to address chronic pancreatic fistula 2002.  Recurrent pseudocyst drained 01/2001.  *    End-stage renal disease.  Dialysis patient since 10/2017.  Dialysis days are MWF.      PLAN:     *   Fortunately the patient just consumed some solid food so we will have to delay EGD until tomorrow.  EGD orders placed in [facility] [place].  Orders for n.p.o. after midnight placed. Ordered Protonix 40 mg p.o. twice daily.  He is also receiving Pepcid 20 mg IV every 12 hours.  He is stable I do not see a need for Protonix drip.   Azucena Freed  01/22/2018, 2:10 PM Phone [phone]

## 2018-01-22 NOTE — ED Notes (Signed)
Called to inform kitchen that pt is on a clear liquid diet.

## 2018-01-22 NOTE — ED Notes (Signed)
MD made aware of troponin

## 2018-01-22 NOTE — H&P (Signed)
Date: 01/22/2018               Patient Name:  Brian [name] MRN: [id number]  DOB: [birth year] Age / Sex: 57 y.o., male   PCP: [name], MD         Medical Service: Internal Medicine Teaching Service         Attending Physician: Dr. Rebeca Alert Raynaldo Opitz, MD    First Contact: Dr. Trilby Drummer Pager: [phone]  Second Contact: Dr. Hetty Ely Pager: [phone]       After Hours (After 5p/  First Contact Pager: [phone]  weekends / holidays): Second Contact Pager: [phone]   Chief Complaint: Dizziness, Dark stool  History of Present Illness: [name] is a 57 yo M with a Hx of Bleeding Ulcer (in "early 2000's"), ESRD (on HD MWF), HTN, DM, CVA (hemorrhagic), and Chronic Leukocytosis (s/p splenectomy) who presented with recent lightheadedness, and episode of dark stool. He states that his symptoms began in the afternoon of 4/16 when he had an episode of vomiting described as dark (but not bloody). He then threw up again at his dialysis session on 4/17. He states he became hypotensive during that session and it had to be cut short. He was feeling lightheaded at that time, which he states happens intermittently with dialysis, but this was significantly worse than normal. He then experienced further lightheaded at home and had a black stool. St this time he call 911 and was transported to the ED. He received 532mL bolus en route, which improved his lightheadedness. The dark stool on 4/17 was his last bowl movement. He denies aspirin or Ibuprofen use. He was prescribed Voltaren gel in January, but has not been taking this since February. He denies fevers, chills, weakness, abdominal pain, nausea, palpitations, chest pain, or shortness of breath.  In the ED, vital signs showed milf intermittent tachycardia, but were otherwise stable. CBC showed Hgb 9.2 and 8.4 on repeat (baseline ~11-12), WBC 15.8 (chronically elevated 2/2 splenectomy); CMP showed Na 130 (corrects to 132), BUN 85 (baseline ~50), Cr 5.48  (consistent with ESRD), AST 48, ALP 198; Troponin 0.06; Fecal Occult was positive. EKG showed sinus tachycardia with PACs and a prolonged QTc. Type and screen obtained. Paitent to be admitted for further workup and care.   Meds:  Current Meds  Medication Sig  . acetaminophen (TYLENOL) 500 MG tablet Take 1,000 mg by mouth daily as needed for mild pain.  Marland Kitchen amLODipine (NORVASC) 10 MG tablet Take 10 mg by mouth daily.  . Cholecalciferol (VITAMIN D) 2000 units tablet Take 4,000 Units by mouth daily.   . insulin aspart protamine- aspart (NOVOLOG MIX 70/30) (70-30) 100 UNIT/ML injection Inject 0.05 mLs (5 Units total) into the skin at bedtime. (Patient taking differently: Inject 5-10 Units into the skin at bedtime. Depending on blood sugar.)    Allergies: Allergies as of 01/21/2018 - Review Complete 01/21/2018  Allergen Reaction Noted  . Penicillins Hives and Rash 06/22/2016  . Nsaids  05/16/2017  . Aspirin Nausea And Vomiting    Past Medical History:  Diagnosis Date  . Arthritis   . Bleeding ulcer   . Chronic kidney disease    stage 5  . Complication of anesthesia    " I had a nose bleed a day or two after surgery-it could have been the oxygen"  . Diabetes mellitus without complication ([place])   . Diabetic neuropathy ([place])   . Elevated TSH   . History of kidney stones   .  Hypertension   . Stroke [facility])    hemorrhagic 2010  . Tobacco abuse    Family History: Family History  Problem Relation Age of Onset  . Lymphoma Mother   . Hypertension Father   . Diabetes Mellitus II Father   . Hypertension Brother   . Diabetes Mellitus I Brother   - Reviewed on admission  Social History: Social History   Tobacco Use  . Smoking status: Current Every Day Smoker    Packs/day: 1.00    Types: Cigarettes  . Smokeless tobacco: Never Used  Substance Use Topics  . Alcohol use: No  . Drug use: No  - Reviewed on admission  Review of Systems: A complete ROS was negative except as per  HPI.  Physical Exam: Blood pressure 134/73, pulse 98, temperature 98.7 F (37.1 C), resp. rate 16, SpO2 100 %. Physical Exam  Constitutional: He is oriented to person, place, and time. He appears well-developed and well-nourished. No distress.  HENT:  Head: Normocephalic and atraumatic.  Eyes:  No conjunctival palor  Cardiovascular: Normal rate, regular rhythm, normal heart sounds and intact distal pulses.  Pulmonary/Chest: Effort normal and breath sounds normal. No respiratory distress.  Abdominal: Soft. Bowel sounds are normal. He exhibits no distension. There is no tenderness.  Musculoskeletal: He exhibits no edema or deformity.  Neurological: He is alert and oriented to person, place, and time.  Skin: Skin is warm and dry.   EKG: personally reviewed my interpretation is sinus tachycardia, PACs, prolonged QTc  Assessment & Plan by Problem:  Demand Ischemia GI Bleed: Patient with history of bleeding ulcer in early 200s presents with 1 day of worsening lightheadedness who was noted to be hypotensive during dialysis on 4/16 that had to be ended early. Dark vomitus on 4/16 and an episode of melena on 4/17. Hgb in ED was 9.2 then 8.4 on repeat. BMP shows BUN 85, elevated from patient's baseline of ~50. Fecal Occult positive in the ED Patient history and clinical. He has a history bleed ing ulcer in "Early 200s" but states he was not prescribed and does not take acid blocking medications.  > Troponin in ED 0.06 likely due to demand ischemia 2/2 GI Bleed and Recent Hypotension > Hemodynamically stable in the ED with Systolic BP in the 917H - If patient experiences recurrent symptoms or Hgb <7 will consult Nephrology for transfusion with HD - GI consulted, appreciated their recommendations. - Famotidine 20mg  IV BID - Cardiac Monitoring - Trend Troponin - AM CBC  ESRD: On HD MWF. Last session Wednesday 4/17 was cut short 2/2 hypotension. - Will consult Nephrology for inpatient dialysis -  AM Renal Profile  Diabetes: Last Hgb A1c 5.8 (02/05/17). On 70/30 Insulin 5 Units qhs at home. - Holding home 70/30 - Hgb A1c - CBGs  Hypertension: Takes Amlodipine 10mg  Daily at home. Normotensive here so far. - Holding home Amlodipine in the setting of recent hypotension and GI Bleed  Chronic Leukocytosis: Patient with chronic leukocytosis 2/2 splenectomy that was performed as part of an operation to remove a pancreatic pseudocyst 2/2 to pancreatitis (in early 200s). WBC 16 (Baseline 12-14). Has been afebrile thus far. - AM CBC  FEN: Renal, 1280mL Restriction; NPO at MN VTE ppx: SCDs Code Status: FULL   Dispo: Admit patient to Inpatient with expected length of stay greater than 2 midnights.  Signed: Neva Seat, MD 01/22/2018, 12:48 PM  Pager: [phone]

## 2018-01-22 NOTE — ED Notes (Signed)
Admitting MD paged to [place] @ [phone].

## 2018-01-22 NOTE — ED Notes (Signed)
Pt's CBG result was 140. Informed Millie - RN.

## 2018-01-22 NOTE — ED Notes (Signed)
Pt ambulated to bathroom without difficulty.  Able to come off tele for tests/procedures.  Able to go to 5C-8 with wheelchair.

## 2018-01-23 ENCOUNTER — Other Ambulatory Visit: Payer: Self-pay

## 2018-01-23 ENCOUNTER — Encounter (HOSPITAL_COMMUNITY): Payer: Self-pay | Admitting: *Deleted

## 2018-01-23 ENCOUNTER — Encounter (HOSPITAL_COMMUNITY)
Admission: EM | Disposition: A | Discharge status: Home / Self Care | Payer: Self-pay | Source: Home / Self Care | Attending: Internal Medicine

## 2018-01-23 ENCOUNTER — Inpatient Hospital Stay (HOSPITAL_COMMUNITY): Payer: Medicare Other

## 2018-01-23 DIAGNOSIS — Z8711 Personal history of peptic ulcer disease: Secondary | ICD-10-CM

## 2018-01-23 DIAGNOSIS — D62 Acute posthemorrhagic anemia: Secondary | ICD-10-CM

## 2018-01-23 DIAGNOSIS — Z9081 Acquired absence of spleen: Secondary | ICD-10-CM

## 2018-01-23 DIAGNOSIS — K269 Duodenal ulcer, unspecified as acute or chronic, without hemorrhage or perforation: Secondary | ICD-10-CM | POA: Diagnosis present

## 2018-01-23 DIAGNOSIS — K264 Chronic or unspecified duodenal ulcer with hemorrhage: Principal | ICD-10-CM

## 2018-01-23 DIAGNOSIS — Z992 Dependence on renal dialysis: Secondary | ICD-10-CM

## 2018-01-23 DIAGNOSIS — K2970 Gastritis, unspecified, without bleeding: Secondary | ICD-10-CM | POA: Diagnosis present

## 2018-01-23 DIAGNOSIS — Z8719 Personal history of other diseases of the digestive system: Secondary | ICD-10-CM

## 2018-01-23 DIAGNOSIS — K2901 Acute gastritis with bleeding: Secondary | ICD-10-CM

## 2018-01-23 DIAGNOSIS — D72829 Elevated white blood cell count, unspecified: Secondary | ICD-10-CM

## 2018-01-23 DIAGNOSIS — E1122 Type 2 diabetes mellitus with diabetic chronic kidney disease: Secondary | ICD-10-CM

## 2018-01-23 DIAGNOSIS — N186 End stage renal disease: Secondary | ICD-10-CM

## 2018-01-23 DIAGNOSIS — I248 Other forms of acute ischemic heart disease: Secondary | ICD-10-CM

## 2018-01-23 DIAGNOSIS — K449 Diaphragmatic hernia without obstruction or gangrene: Secondary | ICD-10-CM

## 2018-01-23 DIAGNOSIS — Z794 Long term (current) use of insulin: Secondary | ICD-10-CM

## 2018-01-23 DIAGNOSIS — Z79899 Other long term (current) drug therapy: Secondary | ICD-10-CM

## 2018-01-23 DIAGNOSIS — I120 Hypertensive chronic kidney disease with stage 5 chronic kidney disease or end stage renal disease: Secondary | ICD-10-CM

## 2018-01-23 HISTORY — PX: ESOPHAGOGASTRODUODENOSCOPY: SHX5428

## 2018-01-23 LAB — RENAL FUNCTION PANEL
Albumin: 3.1 g/dL — ABNORMAL LOW (ref 3.5–5.0)
Anion gap: 15 (ref 5–15)
BUN: 114 mg/dL — ABNORMAL HIGH (ref 6–20)
CO2: 22 mmol/L (ref 22–32)
Calcium: 8.2 mg/dL — ABNORMAL LOW (ref 8.9–10.3)
Chloride: 97 mmol/L — ABNORMAL LOW (ref 101–111)
Creatinine, Ser: 7.4 mg/dL — ABNORMAL HIGH (ref 0.61–1.24)
GFR calc Af Amer: 9 mL/min — ABNORMAL LOW (ref >60)
GFR calc non Af Amer: 7 mL/min — ABNORMAL LOW (ref >60)
Glucose, Bld: 111 mg/dL — ABNORMAL HIGH (ref 65–99)
Phosphorus: 5.9 mg/dL — ABNORMAL HIGH (ref 2.5–4.6)
Potassium: 4.1 mmol/L (ref 3.5–5.1)
Sodium: 134 mmol/L — ABNORMAL LOW (ref 135–145)

## 2018-01-23 LAB — GLUCOSE, CAPILLARY
Glucose-Capillary: 103 mg/dL — ABNORMAL HIGH (ref 65–99)
Glucose-Capillary: 135 mg/dL — ABNORMAL HIGH (ref 65–99)
Glucose-Capillary: 136 mg/dL — ABNORMAL HIGH (ref 65–99)
Glucose-Capillary: 123 mg/dL — ABNORMAL HIGH (ref 65–99)

## 2018-01-23 LAB — CBC
HCT: 23.7 % — ABNORMAL LOW (ref 39.0–52.0)
Hemoglobin: 7.8 g/dL — ABNORMAL LOW (ref 13.0–17.0)
MCH: 32.5 pg (ref 26.0–34.0)
MCHC: 32.9 g/dL (ref 30.0–36.0)
MCV: 98.8 fL (ref 78.0–100.0)
Platelets: 302 10*3/uL (ref 150–400)
RBC: 2.4 MIL/uL — ABNORMAL LOW (ref 4.22–5.81)
RDW: 13.3 % (ref 11.5–15.5)
WBC: 15.3 10*3/uL — ABNORMAL HIGH (ref 4.0–10.5)

## 2018-01-23 LAB — TROPONIN I: Troponin I: 0.05 ng/mL

## 2018-01-23 LAB — MRSA PCR SCREENING: MRSA by PCR: NEGATIVE

## 2018-01-23 SURGERY — EGD (ESOPHAGOGASTRODUODENOSCOPY)
Anesthesia: Monitor Anesthesia Care

## 2018-01-23 MED ORDER — SODIUM CHLORIDE 0.9 % IV SOLN: 100.0000 mL | INTRAVENOUS | Status: DC | PRN

## 2018-01-23 MED ORDER — PANTOPRAZOLE SODIUM 40 MG IV SOLR
40.0000 mg | Freq: Two times a day (BID) | INTRAVENOUS | Status: DC
Start: 2018-01-23 — End: 2018-01-25
  Administered 2018-01-23 – 2018-01-25 (×6): 40 mg via INTRAVENOUS
  Filled 2018-01-23 (×6): qty 40

## 2018-01-23 MED ORDER — LIDOCAINE HCL (PF) 1 % IJ SOLN: 5.0000 mL | INTRAMUSCULAR | Status: DC | PRN

## 2018-01-23 MED ORDER — LIDOCAINE-PRILOCAINE 2.5-2.5 % EX CREA: 1.0000 "application " | TOPICAL_CREAM | CUTANEOUS | Status: DC | PRN

## 2018-01-23 MED ORDER — SODIUM CHLORIDE 0.9 % IV SOLN
INTRAVENOUS | Status: DC
  Administered 2018-01-23: 09:00:00 via INTRAVENOUS

## 2018-01-23 MED ORDER — PROPOFOL 500 MG/50ML IV EMUL
INTRAVENOUS | Status: DC | PRN
  Administered 2018-01-23: 100 ug/kg/min via INTRAVENOUS

## 2018-01-23 MED ORDER — PROPOFOL 10 MG/ML IV BOLUS
INTRAVENOUS | Status: DC | PRN
  Administered 2018-01-23: 30 mg via INTRAVENOUS

## 2018-01-23 MED ORDER — HEPARIN SODIUM (PORCINE) 1000 UNIT/ML DIALYSIS: 1000.0000 [IU] | INTRAMUSCULAR | Status: DC | PRN

## 2018-01-23 MED ORDER — DOXERCALCIFEROL 4 MCG/2ML IV SOLN: 3.0000 ug | INTRAVENOUS | Status: DC

## 2018-01-23 MED ORDER — PENTAFLUOROPROP-TETRAFLUOROETH EX AERO: 1.0000 "application " | INHALATION_SPRAY | CUTANEOUS | Status: DC | PRN

## 2018-01-23 MED ORDER — SODIUM CHLORIDE 0.9 % IV SOLN: 125.0000 mg | INTRAVENOUS | Status: DC

## 2018-01-23 NOTE — Consult Note (Addendum)
[facility] Renal Consultation Note    Indication for Consultation:  Management of ESRD/hemodialysis; anemia, hypertension/volume and secondary hyperparathyroidism  HPI: Brian [name] is a 57 y.o. male with ESRD 2/2 DM, HD started 10/2017 (MWF at [facility]), HTN, Type 2 DM, HLD, hx hemorraghic stroke 2010.   He is admitted with acute GI bleed. Reports 2 day history of vomiting with some dark-colored emesis, also reports passing dark stools about 2 days ago. He felt bad after dialysis on Wed with dizziness and called EMS.  ED workup significant for positive FOBT with Hgb 9.2>8.4>7.8. He was seen by GI and underwent upper endoscopy today which showed Gastritis with heme noted throughout the stomach. Biopsied to rule out H pylori. - One cratered duodenal ulcer with 2 red spots, suspected vessels. Treated with a monopolar probe to prevent rebleeding.  Seen in room after procedure and tolerating clear liquids. He denies further vomiting episodes and has not had BM since admission. Denies CP, SOB, N,V,D, abd pain.   He has been having some trouble meeting his EDW at dialysis mostly due to hypotension during treatment. He has been compliant with treatment but usually leaves over his dry weight.  His amlodipine was recently reduced to 5mg  and his dry weight was raised to 77kg on Wed.     Past Medical History:  Diagnosis Date  . Arthritis   . Bleeding ulcer   . Chronic kidney disease    stage 5  . Complication of anesthesia    " I had a nose bleed a day or two after surgery-it could have been the oxygen"  . Diabetes mellitus without complication ([place])   . Diabetic neuropathy ([place])   . Elevated TSH   . History of kidney stones   . Hypertension   . Stroke [facility])    hemorrhagic 2010  . Tobacco abuse    Past Surgical History:  Procedure Laterality Date  . AV FISTULA PLACEMENT Left 05/16/2017   Procedure: ARTERIOVENOUS (AV) FISTULA CREATION-LEFT;  Surgeon:  Angelia Mould, MD;  Location: [facility];  Service: Vascular;  Laterality: Left;  . CHOLECYSTECTOMY    . COLONOSCOPY    . PANCREAS SURGERY    . SPLENECTOMY     Family History  Problem Relation Age of Onset  . Lymphoma Mother   . Hypertension Father   . Diabetes Mellitus II Father   . Hypertension Brother   . Diabetes Mellitus I Brother    Social History:  reports that he has been smoking cigarettes.  He has a 15.00 pack-year smoking history. He has never used smokeless tobacco. He reports that he does not drink alcohol or use drugs. Allergies  Allergen Reactions  . Penicillins Hives and Rash    Has patient had a PCN reaction causing immediate rash, facial/tongue/throat swelling, SOB or lightheadedness with hypotension: Yes Has patient had a PCN reaction causing severe rash involving mucus membranes or skin necrosis: No Has patient had a PCN reaction that required hospitalization: No Has patient had a PCN reaction occurring within the last 10 years: No If all of the above answers are "NO", then may proceed with Cephalosporin use.   . Nsaids     Due to kidneys  . Aspirin Nausea And Vomiting    Other Reaction: GI Upset   Prior to Admission medications   Medication Sig Start Date End Date Taking? Authorizing Provider  acetaminophen (TYLENOL) 500 MG tablet Take 1,000 mg by mouth daily as needed for mild  pain.   Yes [provider]  amLODipine (NORVASC) 10 MG tablet Take 10 mg by mouth daily.   Yes [provider]  Cholecalciferol (VITAMIN D) 2000 units tablet Take 4,000 Units by mouth daily.    Yes [provider]  insulin aspart protamine- aspart (NOVOLOG MIX 70/30) (70-30) 100 UNIT/ML injection Inject 0.05 mLs (5 Units total) into the skin at bedtime. Patient taking differently: Inject 5-10 Units into the skin at bedtime. Depending on blood sugar. 02/07/17  Yes Eugenie Filler, MD  diclofenac sodium (VOLTAREN) 1 % GEL Apply 2 g topically 4 (four)  times daily. Patient not taking: Reported on 12/03/2017 10/20/17   Trula Slade, DPM   Current Facility-Administered Medications  Medication Dose Route Frequency Provider Last Rate Last Dose  . pantoprazole (PROTONIX) injection 40 mg  40 mg Intravenous Q12H Vena Rua, PA-C   40 mg at 01/23/18 1232  . sodium chloride flush (NS) 0.9 % injection 3 mL  3 mL Intravenous Q12H Neva Seat, MD   3 mL at 01/22/18 1519    ROS: As per HPI otherwise negative.  Physical Exam: Vitals:   01/23/18 0731 01/23/18 0840 01/23/18 1012 01/23/18 1020  BP: 117/74 (!) 145/83 (!) 100/44 101/64  Pulse: (!) 107 100 85 95  Resp:  18 20 19   Temp:  98.3 F (36.8 C) 98.2 F (36.8 C)   TempSrc:  Oral Oral   SpO2: 100% 100% 100% 100%  Weight:  79.8 kg (176 lb)    Height:  5\' 9"  (1.753 m)       General: WDWN male, appears comfortable NAD Head: NCAT sclera not icteric MMM Neck: Supple. No JVD No masses Lungs: CTA bilaterally without wheezes, rales, or rhonchi. Breathing is unlabored. Heart: RRR with S1 S2 Abdomen: soft NT + BS Lower extremities:without edema or ischemic changes, no open wounds  Neuro: A & O  X 3. Moves all extremities spontaneously. Psych:  Responds to questions appropriately with a normal affect. Dialysis Access: LUE AVF +thrill   Labs: Basic Metabolic Panel: Recent Labs  Lab 01/21/18 2230 01/23/18 0334  NA 130* 134*  K 4.7 4.1  CL 91* 97*  CO2 23 22  GLUCOSE 199* 111*  BUN 85* 114*  CREATININE 5.48* 7.40*  CALCIUM 8.7* 8.2*  PHOS  --  5.9*   Liver Function Tests: Recent Labs  Lab 01/21/18 2230 01/23/18 0334  AST 48*  --   ALT 39  --   ALKPHOS 198*  --   BILITOT 0.5  --   PROT 6.5  --   ALBUMIN 3.2* 3.1*   No results for input(s): LIPASE, AMYLASE in the last 168 hours. No results for input(s): AMMONIA in the last 168 hours. CBC: Recent Labs  Lab 01/21/18 2230 01/22/18 0951 01/23/18 0334  WBC 16.9* 15.8* 15.3*  NEUTROABS 9.3*  --   --   HGB 9.2*  8.4* 7.8*  HCT 28.3* 25.7* 23.7*  MCV 98.6 99.6 98.8  PLT 291 296 302   Cardiac Enzymes: Recent Labs  Lab 01/22/18 0951 01/22/18 1550 01/22/18 2336  TROPONINI 0.06* 0.06* 0.05*   CBG: Recent Labs  Lab 01/22/18 1804 01/22/18 2240 01/23/18 0728 01/23/18 1133  GLUCAP 140* 131* 103* 135*   Iron Studies: No results for input(s): IRON, TIBC, TRANSFERRIN, FERRITIN in the last 72 hours. Studies/Results: No results found.  Dialysis Orders:  [facility] MWF 4h 180NRe 400/800 EDW 77kg 2K/2.25Ca  L AVF Hep 3000U Hectorol 42mcg IV TIW Venofer 100mg   IV x10 (8/10 given)   Assessment/Plan: 1. GI Bleed - s/p EGD today with gastritis biopsied to rule out H pylori.  1 duodenal ulcer treated to prevent rebleeding.  2. ESRD -  MWF. For HD today on schedule.  3. Hypotension/volume  - Hypotensive episodes on HD recently. Hold amlodipine. No volume excess on exam. Outpatient EDW recently raised.  4. Anemia  - Hgb 7.8. Follow trends. Transfuse if <7. Continue IV Fe load.  5.  Metabolic bone disease -  Ca/Phos ok. Follow trends. Using Tums as binder. Continue VDRA  6. DM - per primary   Lynnda Child PA-C [place] Pager [phone] 01/23/2018, 1:52 PM   Pt seen, examined and agree w A/P as above.  Kelly Splinter MD [name] pager [phone]   01/23/2018, 2:42 PM

## 2018-01-23 NOTE — Care Management Note (Addendum)
Case Management Note  Patient Details  Name: DEAIRE MCWHIRTER MRN: [id number] Date of Birth: Jul 12, 1961  Subjective/Objective:    History of  ESRD 2/2 DM, HD started 10/2017 (MWF at [facility]), HTN, Type 2 DM, HLD,  hemorraghic stroke 2010; Admitted for GI bleed.           Action/Plan: GI consulted.  S/p upper endoscopy today which showed Gastritis with heme noted throughout the stomach. Biopsied to rule out H pylori. - One cratered duodenal ulcer with 2 red spots, suspected vessels. Treated with a monopolar probe to prevent rebleeding. PCP noted.  Prior to admission patient lived at home.  Will be returning to the same living situation after discharge.  At discharge, patient has transportation home.  Patient has the ability to pay for medications and food.  Expected Discharge Date:    To Be Determined              Expected Discharge Plan:  Home/Self Care  In-House Referral:   N/A Discharge planning Services  CM Consult  Status of Service:  In process, will continue to follow  Kristen Cardinal, RN  Nurse Case Manager [place] 01/23/2018, 3:05 PM

## 2018-01-23 NOTE — Anesthesia Preprocedure Evaluation (Signed)
Anesthesia Evaluation  Patient identified by MRN, date of birth, ID band Patient awake    Reviewed: Allergy & Precautions, NPO status , Patient's Chart, lab work & pertinent test results  Airway Mallampati: II  TM Distance: >3 FB Neck ROM: Full    Dental  (+) Dental Advisory Given   Pulmonary Current Smoker,    breath sounds clear to auscultation       Cardiovascular hypertension, Pt. on medications  Rhythm:Regular Rate:Normal     Neuro/Psych CVA    GI/Hepatic Neg liver ROS, PUD,   Endo/Other  diabetes, Type 2, Insulin Dependent  Renal/GU ESRF and DialysisRenal disease     Musculoskeletal  (+) Arthritis ,   Abdominal   Peds  Hematology negative hematology ROS (+)   Anesthesia Other Findings   Reproductive/Obstetrics                             Lab Results  Component Value Date   WBC 15.3 (H) 01/23/2018   HGB 7.8 (L) 01/23/2018   HCT 23.7 (L) 01/23/2018   MCV 98.8 01/23/2018   PLT 302 01/23/2018   Lab Results  Component Value Date   CREATININE 7.40 (H) 01/23/2018   BUN 114 (H) 01/23/2018   NA 134 (L) 01/23/2018   K 4.1 01/23/2018   CL 97 (L) 01/23/2018   CO2 22 01/23/2018    Anesthesia Physical Anesthesia Plan  ASA: III  Anesthesia Plan: MAC   Post-op Pain Management:    Induction: Intravenous  PONV Risk Score and Plan: 0 and Treatment may vary due to age or medical condition, Propofol infusion and Ondansetron  Airway Management Planned: Natural Airway and Nasal Cannula  Additional Equipment:   Intra-op Plan:   Post-operative Plan:   Informed Consent: I have reviewed the patients History and Physical, chart, labs and discussed the procedure including the risks, benefits and alternatives for the proposed anesthesia with the patient or authorized representative who has indicated his/her understanding and acceptance.     Plan Discussed with:   Anesthesia Plan  Comments:         Anesthesia Quick Evaluation

## 2018-01-23 NOTE — Op Note (Signed)
[facility] Patient Name: Brian [name] Procedure Date : 01/23/2018 MRN: [id number] Attending MD: Carlota Raspberry. Della Homan MD, MD Date of Birth: Jan 18, 1961 CSN: [id number] Age: 57 Admit Type: Inpatient Procedure:                Upper GI endoscopy Indications:              Suspected upper gastrointestinal bleeding Providers:                Remo Lipps P. Jadalee Westcott MD, MD, Kingsley Plan, RN,                            Laurena Spies, Technician Referring MD:              Medicines:                Monitored Anesthesia Care Complications:            No immediate complications. Estimated blood loss:                            Minimal. Estimated Blood Loss:     Estimated blood loss was minimal. Procedure:                Pre-Anesthesia Assessment:                           - Prior to the procedure, a History and Physical                            was performed, and patient medications and                            allergies were reviewed. The patient's tolerance of                            previous anesthesia was also reviewed. The risks                            and benefits of the procedure and the sedation                            options and risks were discussed with the patient.                            All questions were answered, and informed consent                            was obtained. Prior Anticoagulants: The patient has                            taken no previous anticoagulant or antiplatelet                            agents. ASA Grade Assessment: III - A patient with  severe systemic disease. After reviewing the risks                            and benefits, the patient was deemed in                            satisfactory condition to undergo the procedure.                           After obtaining informed consent, the endoscope was                            passed under direct vision. Throughout the   procedure, the patient's blood pressure, pulse, and                            oxygen saturations were monitored continuously. The                            EG-[id number] ([id number]) scope was introduced through the                            mouth, and advanced to the second part of duodenum.                            The upper GI endoscopy was accomplished without                            difficulty. The patient tolerated the procedure                            well. Scope In: Scope Out: Findings:      Esophagogastric landmarks were identified: the Z-line was found at 40       cm, the gastroesophageal junction was found at 40 cm and the upper       extent of the gastric folds was found at 43 cm from the incisors.      A 3 cm hiatal hernia was present.      The exam of the esophagus was otherwise normal.      Patchy moderate inflammation characterized by congestion (edema),       erythema and friability was found in the gastric body without focal       ulceration. Biopsies were taken with a cold forceps for Helicobacter       pylori testing.      Heme was also noted throughout the stomach and was lavaged. The exam of       the stomach was otherwise normal.      One non-bleeding cratered duodenal ulcer with two erythematous spots,       suspected visible vessels, was found in the duodenal bulb. The lesion       was 5 mm in largest dimension. Fulguration to ablate the vessels to       prevent re-bleeding by monopolar probe was successful.      The exam of the duodenum was otherwise normal. Impression:               -  Esophagogastric landmarks identified.                           - 3 cm hiatal hernia.                           - Normal esophagus                           - Gastritis with heme noted throughout the stomach.                            Biopsied to rule out H pylori.                           - One cratered duodenal ulcer with 2 red spots,                            suspected  vessels. Treated with a monopolar probe                            to prevent rebleeding. Recommendation:           - Return patient to hospital ward for ongoing care.                           - Clear liquid diet today.                           - Continue IV 40mg  protonix BID                           - Continue present medications.                           - Await pathology results.                           - Patient will need to remain inpatient for a few                            more days given endoscopic findings and therapy                            provided today, at risk for rebleeding. We will                            continue to follow                           - NO NSAIDs Procedure Code(s):        --- Professional ---                           34196, 59, Esophagogastroduodenoscopy, flexible,  transoral; with control of bleeding, any method                           43239, Esophagogastroduodenoscopy, flexible,                            transoral; with biopsy, single or multiple Diagnosis Code(s):        --- Professional ---                           K44.9, Diaphragmatic hernia without obstruction or                            gangrene                           K29.70, Gastritis, unspecified, without bleeding                           K26.4, Chronic or unspecified duodenal ulcer with                            hemorrhage CPT copyright 2017 American Medical Association. All rights reserved. The codes documented in this report are preliminary and upon coder review may  be revised to meet current compliance requirements. Remo Lipps P. Ariyannah Pauling MD, MD 01/23/2018 10:15:49 AM This report has been signed electronically. Number of Addenda: 0

## 2018-01-23 NOTE — Anesthesia Postprocedure Evaluation (Signed)
Anesthesia Post Note  Patient: Brian [name]  Procedure(s) Performed: ESOPHAGOGASTRODUODENOSCOPY (EGD) (N/A )     Patient location during evaluation: PACU Anesthesia Type: MAC Level of consciousness: awake and alert Pain management: pain level controlled Vital Signs Assessment: post-procedure vital signs reviewed and stable Respiratory status: spontaneous breathing, nonlabored ventilation, respiratory function stable and patient connected to nasal cannula oxygen Cardiovascular status: stable and blood pressure returned to baseline Postop Assessment: no apparent nausea or vomiting Anesthetic complications: no    Last Vitals:  Vitals:   01/23/18 1012 01/23/18 1020  BP: (!) 100/44 101/64  Pulse: 85 95  Resp: 20 19  Temp: 36.8 C   SpO2: 100% 100%    Last Pain:  Vitals:   01/23/18 1012  TempSrc: Oral  PainSc: 0-No pain                 Tiajuana Amass

## 2018-01-23 NOTE — Progress Notes (Signed)
   Subjective: Brian [name] was seen resting comfortably in his bed this morning. He states he wa feeling well and was not any any pain or experiencing any nausea. He had already had his EGD and states that he was told about and shown an image of his culprit ulcer, which had been cauterized. He is tolerating eating well. He denies any bowl movements since his black one 2 days ago. His wife was at beside and was updated on what medications he would be going home on. He had no other complaints or questions this morning.  Objective:  Vital signs in last 24 hours: Vitals:   01/23/18 0840 01/23/18 1012 01/23/18 1020 01/23/18 1627  BP: (!) 145/83 (!) 100/44 101/64 (!) 148/85  Pulse: 100 85 95 88  Resp: 18 20 19 18   Temp: 98.3 F (36.8 C) 98.2 F (36.8 C)  97.9 F (36.6 C)  TempSrc: Oral Oral  Oral  SpO2: 100% 100% 100% 100%  Weight: 176 lb (79.8 kg)     Height: 5\' 9"  (1.753 m)      Physical Exam  Constitutional: He is oriented to person, place, and time. He appears well-developed and well-nourished. No distress.  HENT:  Head: Normocephalic and atraumatic.  Cardiovascular: Normal rate, regular rhythm, normal heart sounds and intact distal pulses.  Pulmonary/Chest: Effort normal and breath sounds normal. No respiratory distress.  Abdominal: Soft. Bowel sounds are normal. He exhibits no distension. There is no tenderness.  Musculoskeletal: He exhibits no edema or deformity.  Neurological: He is alert and oriented to person, place, and time.  Skin: Skin is warm and dry.   Assessment/Plan: Demand Ischemia Bleeding Duodenal Ulcer: Patient w/ Hx of bleeding ulcer in 2000s presented w/ worsening lightheadedness, dark vomitus, and melena x 1d.  > Hgb in ED was 9.2 then 8.4 on repeat. BMP showed elevated BUN 85. Fecal Occult positive > Troponin 0.06>>0.05>>0.05  Likely 2/2 demand ischemia from GI Bleed > EGD: 3 cm hiatal hernia; Normal esophagus; Gastritis with heme noted throughout the stomach.  Biopsied to rule out H pylori; One cratered duodenal ulcer with 2 red spots, suspected vessels. Treated with a monopolar probe to prevent rebleeding. > Pathology: Pending - If patient experiences recurrent symptoms or Hgb <7 will transfuse - GI consulted, appreciated their recommendations. - Cardiac Monitoring - Protononix 40mg  IV BID - Clear liquid diet - AM CBC  ESRD: On HD MWF. Last session Wednesday 4/17 was cut short 2/2 hypotension. - Nephrology following, HD Today  Diabetes: Last Hgb A1c 5.8 (02/05/17). On 70/30 Insulin 5 Units qhs at home. Glucose 100-140 here - Holding home 70/30 - Hgb A1c - CBGs  Hypertension: Takes Amlodipine 10mg  Daily at home. [id number]-353I Systolic Last 14ERX. - Holding home Amlodipine in the setting of recent hypotension and GI Bleed  Chronic Leukocytosis: Stable. Patient with chronic leukocytosis 2/2 splenectomy that was performed as part of an operation to remove a pancreatic pseudocyst 2/2 to pancreatitis (in early 200s). WBC 16 (Baseline 12-14). Has been afebrile thus far.  FEN: Clear liquid VTE ppx: SCDs Code Status: FULL   Dispo: Anticipated discharge in approximately 1-2 day(s).   Neva Seat, MD 01/23/2018, 4:31 PM Pager: [phone]

## 2018-01-23 NOTE — Transfer of Care (Signed)
Immediate Anesthesia Transfer of Care Note  Patient: Brian [name]  Procedure(s) Performed: ESOPHAGOGASTRODUODENOSCOPY (EGD) (N/A )  Patient Location: Endoscopy Unit  Anesthesia Type:MAC  Level of Consciousness: drowsy  Airway & Oxygen Therapy: Patient Spontanous Breathing and Patient connected to nasal cannula oxygen  Post-op Assessment: Report given to RN and Post -op Vital signs reviewed and stable  Post vital signs: Reviewed and stable  Last Vitals:  Vitals Value Taken Time  BP    Temp    Pulse 86 01/23/2018 10:10 AM  Resp 19 01/23/2018 10:10 AM  SpO2 96 % 01/23/2018 10:10 AM  Vitals shown include unvalidated device data.  Last Pain:  Vitals:   01/23/18 0840  TempSrc: Oral  PainSc: 0-No pain         Complications: No apparent anesthesia complications

## 2018-01-23 NOTE — Progress Notes (Signed)
  Date: 01/23/2018  Patient name: Brian [name]  Medical record number: [id number]  Date of birth: 30-Sep-1961   I have seen and evaluated this patient and I have discussed the plan of care with the house staff. Please see their note for complete details. I concur with their findings with the following additions/corrections:   57 yo man admitted with dizziness, found to have acute blood loss anemia. Has history of PUD, EGD today showed duodenal ulcer, which was treated, and gastritis. No need for transfusion yet, will track closely, keep on PPI, per GI high risk for rebleeding. Nephrology consulted for HD.   Lenice Pressman, M.D., Ph.D. 01/23/2018, 4:39 PM

## 2018-01-24 DIAGNOSIS — E1022 Type 1 diabetes mellitus with diabetic chronic kidney disease: Secondary | ICD-10-CM

## 2018-01-24 DIAGNOSIS — N184 Chronic kidney disease, stage 4 (severe): Secondary | ICD-10-CM

## 2018-01-24 DIAGNOSIS — I129 Hypertensive chronic kidney disease with stage 1 through stage 4 chronic kidney disease, or unspecified chronic kidney disease: Secondary | ICD-10-CM

## 2018-01-24 LAB — CBC
HCT: 22.2 % — ABNORMAL LOW (ref 39.0–52.0)
Hemoglobin: 7.4 g/dL — ABNORMAL LOW (ref 13.0–17.0)
MCH: 32.5 pg (ref 26.0–34.0)
MCHC: 33.3 g/dL (ref 30.0–36.0)
MCV: 97.4 fL (ref 78.0–100.0)
Platelets: 329 10*3/uL (ref 150–400)
RBC: 2.28 MIL/uL — ABNORMAL LOW (ref 4.22–5.81)
RDW: 13.4 % (ref 11.5–15.5)
WBC: 13.4 10*3/uL — ABNORMAL HIGH (ref 4.0–10.5)

## 2018-01-24 LAB — GLUCOSE, CAPILLARY
Glucose-Capillary: 161 mg/dL — ABNORMAL HIGH (ref 65–99)
Glucose-Capillary: 169 mg/dL — ABNORMAL HIGH (ref 65–99)
Glucose-Capillary: 144 mg/dL — ABNORMAL HIGH (ref 65–99)
Glucose-Capillary: 114 mg/dL — ABNORMAL HIGH (ref 65–99)

## 2018-01-24 LAB — RENAL FUNCTION PANEL
ALBUMIN: 3.2 g/dL — AB (ref 3.5–5.0)
ANION GAP: 16 — AB (ref 5–15)
BUN: 119 mg/dL — ABNORMAL HIGH (ref 6–20)
CALCIUM: 7.9 mg/dL — AB (ref 8.9–10.3)
CO2: 19 mmol/L — ABNORMAL LOW (ref 22–32)
Chloride: 97 mmol/L — ABNORMAL LOW (ref 101–111)
Creatinine, Ser: 8.63 mg/dL — ABNORMAL HIGH (ref 0.61–1.24)
GFR, EST AFRICAN AMERICAN: 7 mL/min — AB (ref >60)
GFR, EST NON AFRICAN AMERICAN: 6 mL/min — AB (ref >60)
Glucose, Bld: 161 mg/dL — ABNORMAL HIGH (ref 65–99)
PHOSPHORUS: 7 mg/dL — AB (ref 2.5–4.6)
POTASSIUM: 3.9 mmol/L (ref 3.5–5.1)
SODIUM: 132 mmol/L — AB (ref 135–145)

## 2018-01-24 MED ORDER — POLYETHYLENE GLYCOL 3350 17 G PO PACK
17.0000 g | PACK | Freq: Every day | ORAL | Status: DC
  Administered 2018-01-24: 17 g via ORAL
  Filled 2018-01-24 (×2): qty 1

## 2018-01-24 NOTE — Progress Notes (Signed)
   Subjective: [name] is feeling well today, he is free of complaints, he has had no further emesis or dark black stools. He has been tolerating liquid diet well and was able to ambulate around the halls without SOB or lightheadedness. He is curious when he will be able to advance diet and ultimately go home.   Objective:  Vital signs in last 24 hours: Vitals:   01/24/18 0800 01/24/18 0830 01/24/18 0854 01/24/18 1038  BP: 126/82 132/84 (!) 141/80 (!) 144/96  Pulse: (!) 106 100 98 (!) 102  Resp: 16 13 14 18   Temp:   98.4 F (36.9 C) 98.5 F (36.9 C)  TempSrc:   Oral Oral  SpO2:   100% 100%  Weight:   164 lb 10.9 oz (74.7 kg)   Height:       General: sitting up in bed drinking soup, no acute distress HEENT: conjunctiva are well perfused Card: RRR, no murmur appreciated, no peripheral edema or calf tenderness  Pulm: lungs clear to auscultation, normal work of breathing  GI: abdomen soft, non tender, non distended   Assessment/Plan:    Duodenal ulcer   Upper GI bleed   Acute blood loss anemia - presented with dizziness and blood loss signs and symptoms of upper GI bleed, hx of PUD  - EGD with gastritis and duodenal ulcer with visible vessels which were cauterized, also noted a 3 cmhiatal hernia - biopsies for H. Pylori, pathology in process    - Hgb trended down to 7.4 from 9 on admission and baseline 12  - transfuse for Hgb <7  - reports no further signs of bleeding since the time of admission  - tolerated clears overnight, advanced to full liquids today  - continue IV protonix 40 BID  - GI following, appreciate their recommendations     Chronic kidney disease, stage V (very severe) (HCC) - 2L off at HD overnight - hypotensive during HD, holding home amlodipine  - nephro noted may need decreased dry weight as he is under dry weight at this time and appears euvolemic with normal BPs - return to MWF HD schedule  - nephrology following, appreciate their recommendations    Type 1 diabetes mellitus with kidney complication (HCC) - on insulin - A1c 5.8, on 70/30 5 units qHS at home  - glucose 120-160 over the past 24 hours  - continue to hold glucose and monitor CBGs as we advance diet   Hypertension  - holding antihypertensive in the setting of acute blood loss and normotension   Chronic Leukocytosis: Stable. Patient with chronic leukocytosis 2/2 splenectomy that was performed as part of an operation to remove a pancreatic pseudocyst2/2 to pancreatitis (in early 200s). WBC 16 (Baseline 12-14). Has been afebrile thus far.  Dispo: Anticipated discharge in approximately 2-3 day(s).   Ledell Noss, MD 01/24/2018, 10:54 AM Pager: [phone]

## 2018-01-24 NOTE — Progress Notes (Signed)
HD tx initiated via 15Gx3, first venous stick was successful w/ good flash but once I opened up bottom of needle the flow was too slow and then stopped, tried to reposition it w/ no success so venous stuck 2nd time and it was successful, pull/push/flush w/o well w/ problem, when tx started and BFR increased and reached prescribed rate of 400 pt had spasms at arterial site which made AP increase. BFR turned down  And needle adjusted and spasms stopped, will increase BFR slowly to help prevent more spasms. VSS, will cont to monitor while on HD tx

## 2018-01-24 NOTE — Progress Notes (Signed)
     [facility] Progress Note   Chief Complaint:   Upper GI bleed    SUBJECTIVE:    feels fine. No abdominal pain, no nausea. No overt bleeding   ASSESSMENT AND PLAN:   1. 57 yo male with upper GI bleed / ABL. He is s/p EGD yesterday >>> findings of gastritis and a DU with probable visible vessel , s/p fulguration treatment. No further bleeding.  -tolerating clears, will advance to fulls -continue IV PPI -hgb down to 7.4 today, from 9.2 on admit and baseline of 12.2. Received IV iron. No blood transfusion have been required. He feels okay -biopsies pending  2. ESRD recently started HD  OBJECTIVE:     Vital signs in last 24 hours: Temp:  [97.9 F (36.6 C)-98.5 F (36.9 C)] 98.5 F (36.9 C) (04/20 1038) Pulse Rate:  [88-106] 102 (04/20 1038) Resp:  [13-25] 18 (04/20 1038) BP: (126-153)/(75-96) 144/96 (04/20 1038) SpO2:  [99 %-100 %] 100 % (04/20 1038) Weight:  [161 lb 13.1 oz (73.4 kg)-170 lb 4.8 oz (77.2 kg)] 164 lb 10.9 oz (74.7 kg) (04/20 0854) Last BM Date: 01/24/18 General:   Alert, well-developed,  white male  in NAD EENT:  Normal hearing, non icteric sclera, conjunctive pink.  Heart:  Regular rate and rhythm; no lower extremity edema Pulm: Normal respiratory effort, lungs CTA bilaterally without wheezes or crackles. Abdomen:  Soft, nondistended, nontender.  Normal bowel sounds, no masses felt. No hepatomegaly.    Neurologic:  Alert and  oriented x4;  grossly normal neurologically. Psych:  Pleasant, cooperative.  Normal mood and affect.   Intake/Output from previous day: 04/19 0701 - 04/20 0700 In: 1540 [P.O.:1440; I.V.:100] Out: 0  Intake/Output this shift: Total I/O In: -  Out: 2000 [Other:2000]  Lab Results: Recent Labs    01/22/18 0951 01/23/18 0334 01/24/18 0548  WBC 15.8* 15.3* 13.4*  HGB 8.4* 7.8* 7.4*  HCT 25.7* 23.7* 22.2*  PLT 296 302 329   BMET Recent Labs    01/21/18 2230 01/23/18 0334 01/24/18 0548  NA 130* 134*  132*  K 4.7 4.1 3.9  CL 91* 97* 97*  CO2 23 22 19*  GLUCOSE 199* 111* 161*  BUN 85* 114* 119*  CREATININE 5.48* 7.40* 8.63*  CALCIUM 8.7* 8.2* 7.9*   LFT Recent Labs    01/21/18 2230  01/24/18 0548  PROT 6.5  --   --   ALBUMIN 3.2*   < > 3.2*  AST 48*  --   --   ALT 39  --   --   ALKPHOS 198*  --   --   BILITOT 0.5  --   --    < > = values in this interval not displayed.    Principal Problem:   Duodenal ulcer Active Problems:   Chronic kidney disease, stage V (very severe) (HCC)   Type 1 diabetes mellitus with kidney complication (HCC) - on insulin   Upper GI bleed   Gastritis   Acute blood loss anemia     LOS: 2 days   Tye Savoy ,NP 01/24/2018, 10:48 AM  Pager number [phone]

## 2018-01-24 NOTE — Progress Notes (Signed)
[facility] Progress Note  Subjective: no c/o, just completed HD started early this am.    Vitals:   01/24/18 0800 01/24/18 0830 01/24/18 0854 01/24/18 1038  BP: 126/82 132/84 (!) 141/80 (!) 144/96  Pulse: (!) 106 100 98 (!) 102  Resp: 16 13 14 18   Temp:   98.4 F (36.9 C) 98.5 F (36.9 C)  TempSrc:   Oral Oral  SpO2:   100% 100%  Weight:   74.7 kg (164 lb 10.9 oz)   Height:        Inpatient medications: . [START ON 01/26/2018] doxercalciferol  3 mcg Intravenous Q M,W,F-HD  . pantoprazole (PROTONIX) IV  40 mg Intravenous Q12H  . sodium chloride flush  3 mL Intravenous Q12H   . sodium chloride    . sodium chloride    . [START ON 01/26/2018] ferric gluconate (FERRLECIT/NULECIT) IV     sodium chloride, sodium chloride, heparin, lidocaine (PF), lidocaine-prilocaine, pentafluoroprop-tetrafluoroeth  Exam: General: WDWN male, appears comfortable NAD Head: NCAT sclera not icteric MMM Neck: Supple. No JVD No masses Lungs: CTA bilaterally without wheezes, rales, or rhonchi. Breathing is unlabored. Heart: RRR with S1 S2 Abdomen: soft NT + BS Lower extremities:without edema or ischemic changes, no open wounds  Neuro: A & O  X 3. Moves all extremities spontaneously. Psych:  Responds to questions appropriately with a normal affect. Dialysis Access: LUE AVF +thrill      Dialysis: [facility] MWF  4h 180NRe 400/800 EDW 77kg 2K/2.25Ca  L AVF Hep 3000U Hectorol 78mcg IV TIW Venofer 100mg  IV x10 (8/10 given)   Assessment/Plan: 1. GI Bleed - s/p EGD today with gastritis biopsied to rule out H pylori.  One (1) duodenal ulcer cauterized to prevent rebleeding.  2. ESRD -  MWF. HD Monday if still here 3. Hypotension/volume  - Hypotensive episodes on HD recently. Holding home amlodipine. Under dry wt, euvolemic on exam and BP's normal today. May need dry wt lowered a bit.  Output low BP's likely due to high gains.  4. Anemia ckd/ abl  - Hgb 7.8. Follow trends. Transfuse if <7.  Continue IV Fe load.  5.  Metabolic bone disease -  Ca/Phos ok. Follow trends. Using Tums as binder. Continue VDRA 6. DM - per primary    Kelly Splinter MD [place] pager [phone]   01/24/2018, 10:52 AM   Recent Labs  Lab 01/21/18 2230 01/23/18 0334 01/24/18 0548  NA 130* 134* 132*  K 4.7 4.1 3.9  CL 91* 97* 97*  CO2 23 22 19*  GLUCOSE 199* 111* 161*  BUN 85* 114* 119*  CREATININE 5.48* 7.40* 8.63*  CALCIUM 8.7* 8.2* 7.9*  PHOS  --  5.9* 7.0*   Recent Labs  Lab 01/21/18 2230 01/23/18 0334 01/24/18 0548  AST 48*  --   --   ALT 39  --   --   ALKPHOS 198*  --   --   BILITOT 0.5  --   --   PROT 6.5  --   --   ALBUMIN 3.2* 3.1* 3.2*   Recent Labs  Lab 01/21/18 2230 01/22/18 0951 01/23/18 0334 01/24/18 0548  WBC 16.9* 15.8* 15.3* 13.4*  NEUTROABS 9.3*  --   --   --   HGB 9.2* 8.4* 7.8* 7.4*  HCT 28.3* 25.7* 23.7* 22.2*  MCV 98.6 99.6 98.8 97.4  PLT 291 296 302 329   Iron/TIBC/Ferritin/ %Sat No results found for: IRON, TIBC, FERRITIN, IRONPCTSAT

## 2018-01-25 ENCOUNTER — Encounter (HOSPITAL_COMMUNITY): Payer: Self-pay | Admitting: Gastroenterology

## 2018-01-25 DIAGNOSIS — Z880 Allergy status to penicillin: Secondary | ICD-10-CM

## 2018-01-25 DIAGNOSIS — Z886 Allergy status to analgesic agent status: Secondary | ICD-10-CM

## 2018-01-25 LAB — CBC
HCT: 23.4 % — ABNORMAL LOW (ref 39.0–52.0)
Hemoglobin: 7.7 g/dL — ABNORMAL LOW (ref 13.0–17.0)
MCH: 32.2 pg (ref 26.0–34.0)
MCHC: 32.9 g/dL (ref 30.0–36.0)
MCV: 97.9 fL (ref 78.0–100.0)
Platelets: 345 10*3/uL (ref 150–400)
RBC: 2.39 MIL/uL — ABNORMAL LOW (ref 4.22–5.81)
RDW: 13.7 % (ref 11.5–15.5)
WBC: 11.2 10*3/uL — ABNORMAL HIGH (ref 4.0–10.5)

## 2018-01-25 LAB — GLUCOSE, CAPILLARY
Glucose-Capillary: 104 mg/dL — ABNORMAL HIGH (ref 65–99)
Glucose-Capillary: 114 mg/dL — ABNORMAL HIGH (ref 65–99)

## 2018-01-25 MED ORDER — PANTOPRAZOLE SODIUM 40 MG PO TBEC: 40.0000 mg | DELAYED_RELEASE_TABLET | Freq: Two times a day (BID) | ORAL | 1 refills | Status: DC

## 2018-01-25 NOTE — Progress Notes (Signed)
      Progress Note   Subjective  Patient reports feeling well. Wants to go home, tolerated liquid diet and starting solids today.    Objective   Vital signs in last 24 hours: Temp:  [98 F (36.7 C)-98.8 F (37.1 C)] 98.6 F (37 C) (04/21 0741) Pulse Rate:  [88-92] 89 (04/21 0741) Resp:  [18] 18 (04/21 0741) BP: (119-131)/(73-88) 119/74 (04/21 0741) SpO2:  [97 %-100 %] 100 % (04/21 0741) Last BM Date: 01/21/18 General:    white male in NAD Heart:  Regular rate and rhythm;  Lungs: Respirations even and unlabored, lungs CTA bilaterally Abdomen:  Soft, nontender and nondistended.  Extremities:  Without edema. Neurologic:  Alert and oriented,  grossly normal neurologically. Psych:  Cooperative. Normal mood and affect.  Intake/Output from previous day: 04/20 0701 - 04/21 0700 In: 1120 [P.O.:1120] Out: 2000  Intake/Output this shift: Total I/O In: 480 [P.O.:480] Out: -   Lab Results: Recent Labs    01/23/18 0334 01/24/18 0548 01/25/18 0358  WBC 15.3* 13.4* 11.2*  HGB 7.8* 7.4* 7.7*  HCT 23.7* 22.2* 23.4*  PLT 302 329 345   BMET Recent Labs    01/23/18 0334 01/24/18 0548  NA 134* 132*  K 4.1 3.9  CL 97* 97*  CO2 22 19*  GLUCOSE 111* 161*  BUN 114* 119*  CREATININE 7.40* 8.63*  CALCIUM 8.2* 7.9*   LFT Recent Labs    01/24/18 0548  ALBUMIN 3.2*   PT/INR No results for input(s): LABPROT, INR in the last 72 hours.  Studies/Results: No results found.     Assessment / Plan:   57 y/o male with history of ESRD and PUD, admitted with upper GI bleed.  EGD on AM of 5/19 showed a duodenal ulcer with visible vessel treated with goldprobe. He's done well on PPI since that time, tolerated liquids yesterday. Hgb stable, no further bleeding. He is really wanting to leave the hospital right now, I discussed plan as outlined with him below.  Recommend: - advance to regular diet - would given PM dose of IV protonix at around 5 or 6PM, and I think if tolerating  solids he can be discharged early evening (that dose will carry him through tomorrow AM in regards to being on IV PPI for an appropriate amount of time post endoscopy). He agrees to stay in the hospital until then. - continue protonix 40mg  BID as outpatient for 1 month, and then decrease to once daily thereafter - No NSAIDs - our office will contact him with pathology results this week. If positive for H pylori, he will need treatment for that - of note, he also needs a screening colonoscopy to get on renal transplant list, asking [place] if we can help with that. Our office can coordinate colonoscopy as outpatient  Please call with questions.   [place] Cellar, MD [facility]

## 2018-01-25 NOTE — Discharge Summary (Signed)
Name: Brian [name] MRN: [id number] DOB: 26-Jul-1961 57 y.o. PCP: [name], MD  Date of Admission: 01/21/2018 10:18 PM Date of Discharge: 01/25/2018 Attending Physician: Oda Kilts, MD  Discharge Diagnosis: Principal Problem:   Duodenal ulcer Active Problems:   Chronic kidney disease, stage V (very severe) ([place])   Type 1 diabetes mellitus with kidney complication ([place]) - on insulin   Upper GI bleed   Gastritis   Acute blood loss anemia   Discharge Medications: Allergies as of 01/25/2018      Reactions   Penicillins Hives, Rash   Has patient had a PCN reaction causing immediate rash, facial/tongue/throat swelling, SOB or lightheadedness with hypotension: Yes Has patient had a PCN reaction causing severe rash involving mucus membranes or skin necrosis: No Has patient had a PCN reaction that required hospitalization: No Has patient had a PCN reaction occurring within the last 10 years: No If all of the above answers are "NO", then may proceed with Cephalosporin use.   Nsaids    Due to kidneys   Aspirin Nausea And Vomiting   Other Reaction: GI Upset      Medication List    STOP taking these medications   acetaminophen 500 MG tablet Commonly known as:  TYLENOL   diclofenac sodium 1 % Gel Commonly known as:  VOLTAREN     TAKE these medications   amLODipine 10 MG tablet Commonly known as:  NORVASC Take 10 mg by mouth daily.   insulin aspart protamine- aspart (70-30) 100 UNIT/ML injection Commonly known as:  NOVOLOG MIX 70/30 Inject 0.05 mLs (5 Units total) into the skin at bedtime. What changed:    how much to take  additional instructions   pantoprazole 40 MG tablet Commonly known as:  PROTONIX Take 1 tablet (40 mg total) by mouth 2 (two) times daily. On 4/19 start taking once daily   Vitamin D 2000 units tablet Take 4,000 Units by mouth daily.       Disposition and follow-up:   Brian [name] was discharged from [facility] in Stable condition.  At the hospital follow up visit please address:  1.  Duodenal ulcer- Ask about further signs of bleeding and symptoms of anemia and ensure that he found access to protonix.   2.  Labs / imaging needed at time of follow-up: CBC  3.  Pending labs/ test needing follow-up: H.Pylori biopsy   Follow-up Appointments: Follow-up Information    [name], MD. Schedule an appointment as soon as possible for a visit in 1 week(s).   Specialty:  Family Medicine Contact information: [place] [address] [phone]        Yetta Flock, MD. Schedule an appointment as soon as possible for a visit in 2 week(s).   Specialty:  Gastroenterology Contact information: [address] [place] [address] [phone]           Hospital Course by problem list: Principal Problem:   Duodenal ulcer   Acute blood loss anemia Mr. Batrez was admit for lightheadedness after an episode of dark stool which was proceeded by one time vomiting of dark non bloody non bilious vomitus. Hemoglobin at the time of admission was 8.4 down from baseline 11-12 and vitals showed mild tachycardia. He did not require transfusion and was started on IV famotidine BID. He went for EGD which showed a 3 cm hiatal hernia, gastritis with heme noted throughout the stomach and a  cratered duodenal ulcer with 2 red spots, suspected vessels treated with a monopolar probe. The stomach was biopsied and sent for H. Pylori testing. His diet was advanced very slowly over the 3 days following EGD. He was able to walk around the halls without lightheadedness or signs of anemia. He was discharged with plans to start PPI and follow up with GI.   ESRD  Dry weight was challenged, he was found to be euvolemic at a lower than prior dry weight. New dry weight may be closer to 73 kg.   Discharge Vitals:   BP 119/74 (BP Location: Right Arm)   Pulse 89   Temp 98.6 F (37  C) (Oral)   Resp 18   Ht 5\' 9"  (1.753 m)   Wt 164 lb 10.9 oz (74.7 kg) Comment: standing weight  SpO2 100%   BMI 24.32 kg/m   Pertinent Labs, Studies, and Procedures:   EGD on 01/23/2018 - 3 cm hiatal hernia. - Normal esophagus - Gastritis with heme noted throughout the stomach. Biopsied to rule out H pylori. - One cratered duodenal ulcer with 2 red spots, suspected vessels. Treated with a monopolar probe to prevent rebleeding  Discharge Instructions: Discharge Instructions    Call MD for:  extreme fatigue   Complete by:  As directed    Call MD for:  persistant dizziness or light-headedness   Complete by:  As directed    Call MD for:  persistant nausea and vomiting   Complete by:  As directed    Call MD for:  severe uncontrolled pain   Complete by:  As directed    Diet - low sodium heart healthy   Complete by:  As directed    Increase activity slowly   Complete by:  As directed       Signed: Ledell Noss, MD 01/25/2018, 2:38 PM   Pager: [phone]

## 2018-01-25 NOTE — Progress Notes (Deleted)
[facility] Progress Note  Subjective: Feels good. No c/os, no further bleeding. Tolerating clear liquids. Hgb 7.7 this am  Vitals:   01/24/18 1642 01/24/18 2123 01/25/18 0445 01/25/18 0741  BP: 128/77 131/73 130/88 119/74  Pulse: 88 92 91 89  Resp: 18 18 18 18   Temp: 98.4 F (36.9 C) 98.8 F (37.1 C) 98 F (36.7 C) 98.6 F (37 C)  TempSrc: Oral Oral Oral Oral  SpO2: 97% 98% 98% 100%  Weight:      Height:        Inpatient medications: . [START ON 01/26/2018] doxercalciferol  3 mcg Intravenous Q M,W,F-HD  . pantoprazole (PROTONIX) IV  40 mg Intravenous Q12H  . polyethylene glycol  17 g Oral Daily  . sodium chloride flush  3 mL Intravenous Q12H   . sodium chloride    . sodium chloride    . [START ON 01/26/2018] ferric gluconate (FERRLECIT/NULECIT) IV     sodium chloride, sodium chloride, heparin, lidocaine (PF), lidocaine-prilocaine, pentafluoroprop-tetrafluoroeth  Exam: General: WDWN male, appears comfortable NAD Lungs: CTA bilaterally without wheezes, rales, or rhonchi. Breathing is unlabored. Heart: RRR with S1 S2 Abdomen: soft NT + BS Lower extremities:without edema or ischemic changes, no open wounds  Dialysis Access: LUE AVF +thrill     Dialysis: [facility] MWF  4h 180NRe 400/800 EDW 77kg 2K/2.25Ca  L AVF Hep 3000U Hectorol 6mcg IV TIW Venofer 100mg  IV x10 (8/10 given)   Assessment/Plan: 1. GI Bleed - s/p EGD 4/19  with gastritis biopsied to rule out H pylori.  One (1) duodenal ulcer cauterized to prevent rebleeding.  2. ESRD -  MWF. Orders written for Monday if still here.  3. Hypotension/volume  - Hypotensive episodes on HD recently. Holding home amlodipine. Under dry wt, euvolemic on exam and BP's normal today. May need dry wt lowered a bit.  Output low BP's likely due to high gains.  4. Anemia ckd/ abl  - Hgb 7.8. Follow trends. Transfuse if <7. Continue IV Fe load.  5.  Metabolic bone disease -  Phos elevated. Has Tums on OP med list. Will need  to add binder when diet resumes.  6. DM - per primary  7. Dispo - possible dc today   Lynnda Child PA-C [place] Pager [phone] 01/25/2018,12:24 PM  Pt seen, examined and agree w A/P as above.  Kelly Splinter MD [facility] pager [phone]   01/25/2018, 12:25 PM      Recent Labs  Lab 01/21/18 2230 01/23/18 0334 01/24/18 0548  NA 130* 134* 132*  K 4.7 4.1 3.9  CL 91* 97* 97*  CO2 23 22 19*  GLUCOSE 199* 111* 161*  BUN 85* 114* 119*  CREATININE 5.48* 7.40* 8.63*  CALCIUM 8.7* 8.2* 7.9*  PHOS  --  5.9* 7.0*   Recent Labs  Lab 01/21/18 2230 01/23/18 0334 01/24/18 0548  AST 48*  --   --   ALT 39  --   --   ALKPHOS 198*  --   --   BILITOT 0.5  --   --   PROT 6.5  --   --   ALBUMIN 3.2* 3.1* 3.2*   Recent Labs  Lab 01/21/18 2230  01/23/18 0334 01/24/18 0548 01/25/18 0358  WBC 16.9*   < > 15.3* 13.4* 11.2*  NEUTROABS 9.3*  --   --   --   --   HGB 9.2*   < > 7.8* 7.4* 7.7*  HCT 28.3*   < >  23.7* 22.2* 23.4*  MCV 98.6   < > 98.8 97.4 97.9  PLT 291   < > 302 329 345   < > = values in this interval not displayed.   Iron/TIBC/Ferritin/ %Sat No results found for: IRON, TIBC, FERRITIN, IRONPCTSAT

## 2018-01-25 NOTE — Discharge Instructions (Signed)
Mr. Frizell, you were hospitalized for a bleeding duodenal ulcer   - take protonix twice daily for the next month then once daily from that point forward   -continue to only slowly advance your diet   - call Dr. Doyne Keel office to follow up and schedule a follow up appointment in the next 1-2 weeks   - call your PCP to schedule a follow up in the next 1-2 weeks   - make sure that you get some iron at dialysis   Please call our clinic if you have any questions or concerns, we may be able to help and keep you from a long and expensive emergency room wait. Our clinic and after hours phone number is [phone], there is always someone available.

## 2018-01-25 NOTE — Progress Notes (Signed)
   Subjective: Mr. Dingley is feeling well today, he is free of complaints, he has had no further emesis or dark black stools. He has been tolerating full liquid diet well and has had no symptoms of anemia.  Objective:  Vital signs in last 24 hours: Vitals:   01/24/18 1642 01/24/18 2123 01/25/18 0445 01/25/18 0741  BP: 128/77 131/73 130/88 119/74  Pulse: 88 92 91 89  Resp: 18 18 18 18   Temp: 98.4 F (36.9 C) 98.8 F (37.1 C) 98 F (36.7 C) 98.6 F (37 C)  TempSrc: Oral Oral Oral Oral  SpO2: 97% 98% 98% 100%  Weight:      Height:       General: sitting up in bed drinking soup, no acute distress HEENT: conjunctiva are well perfused Card: RRR, no murmur appreciated, no peripheral edema or calf tenderness  Pulm: lungs clear to auscultation, normal work of breathing  GI: abdomen soft, non tender, non distended   Assessment/Plan:    Duodenal ulcer   Upper GI bleed   Acute blood loss anemia - presented with dizziness and blood loss signs and symptoms of upper GI bleed, hx of PUD  - EGD with gastritis and duodenal ulcer with visible vessels which were cauterized, also noted a 3 cmhiatal hernia - biopsies for H. Pylori, pathology in process, GI will contact him with pathology results this week     - Hgb trended down to 7.7 from 9 on admission and baseline 12  - transfuse for Hgb <7  - reports no further signs of bleeding since the time of admission  - tolerated full liquids today, advance to regular   - continue IV protonix 40 BID, GI feels that he can leave after the evening dose of IV protonix  - GI following, appreciate their recommendations     Chronic kidney disease, stage V (very severe) (HCC) - nephro noted may need decreased dry weight as he is under dry weight at this time and appears euvolemic with normal BPs - return to MWF HD schedule  - nephrology following, appreciate their recommendations     Type 1 diabetes mellitus with kidney complication (HCC) - on insulin - A1c  5.8, on 70/30 5 units qHS at home  - glucose 133-110s over the past 24 hours  - continue to hold glucose and monitor CBGs as we advance diet    Hypertension  - holding antihypertensive in the setting of acute blood loss and normotension   Chronic Leukocytosis: Stable. Patient with chronic leukocytosis 2/2 splenectomy that was performed as part of an operation to remove a pancreatic pseudocyst2/2 to pancreatitis (in early 200s). WBC 16 (Baseline 12-14). Has been afebrile thus far.  Dispo: Anticipated discharge in approximately today or tomorrow   Ledell Noss, MD 01/25/2018, 1:54 PM Pager: [phone]

## 2018-01-25 NOTE — Progress Notes (Signed)
Discharge instructions, medications and f/u appt discussed and reviewed with pt, verbalized understanding. IV discontinued, site clean and dry. Pt was escorted out of the unit, took all belongings with him.

## 2018-01-25 NOTE — Progress Notes (Addendum)
[name] Progress Note  Subjective: Feels good. No c/os, no further bleeding. Tolerating clear liquids. Hgb 7.7 this am  Vitals:   01/24/18 1642 01/24/18 2123 01/25/18 0445 01/25/18 0741  BP: 128/77 131/73 130/88 119/74  Pulse: 88 92 91 89  Resp: 18 18 18 18   Temp: 98.4 F (36.9 C) 98.8 F (37.1 C) 98 F (36.7 C) 98.6 F (37 C)  TempSrc: Oral Oral Oral Oral  SpO2: 97% 98% 98% 100%  Weight:      Height:        Inpatient medications: . [START ON 01/26/2018] doxercalciferol  3 mcg Intravenous Q M,W,F-HD  . pantoprazole (PROTONIX) IV  40 mg Intravenous Q12H  . polyethylene glycol  17 g Oral Daily  . sodium chloride flush  3 mL Intravenous Q12H   . sodium chloride    . sodium chloride    . [START ON 01/26/2018] ferric gluconate (FERRLECIT/NULECIT) IV     sodium chloride, sodium chloride, heparin, lidocaine (PF), lidocaine-prilocaine, pentafluoroprop-tetrafluoroeth  Exam: General: WDWN male, appears comfortable NAD Lungs: CTA bilaterally without wheezes, rales, or rhonchi. Breathing is unlabored. Heart: RRR with S1 S2 Abdomen: soft NT + BS Lower extremities:without edema or ischemic changes, no open wounds  Dialysis Access: LUE AVF +thrill     Dialysis: [place] MWF  4h 180NRe 400/800 EDW 77kg 2K/2.25Ca  L AVF Hep 3000U Hectorol 86mcg IV TIW Venofer 100mg  IV x10 (8/10 given)   Assessment/Plan: 1. GI Bleed - s/p EGD 4/19  with gastritis biopsied to rule out H pylori.  One (1) duodenal ulcer cauterized to prevent rebleeding.  2. ESRD -  MWF. Orders written for Monday if still here.  3. Hypotension/volume  - Hypotensive episodes on HD recently. Holding home amlodipine. Under dry wt, euvolemic on exam and BP's normal today. May need dry wt lowered a bit.  Output low BP's likely due to high gains.  4. Anemia ckd/ abl  - Hgb 7.8. Follow trends. Transfuse if <7. Continue IV Fe load.  5.  Metabolic bone disease -  Phos elevated. Has Tums on OP med list. Will need  to add binder when diet resumes.  6. DM - per primary    Lynnda Child PA-C [place] Pager [phone] 01/25/2018,10:27 AM  Pt seen, examined and agree w A/P as above.  Kelly Splinter MD [facility] pager [phone]   01/29/2018, 12:51 PM      Recent Labs  Lab 01/21/18 2230 01/23/18 0334 01/24/18 0548  NA 130* 134* 132*  K 4.7 4.1 3.9  CL 91* 97* 97*  CO2 23 22 19*  GLUCOSE 199* 111* 161*  BUN 85* 114* 119*  CREATININE 5.48* 7.40* 8.63*  CALCIUM 8.7* 8.2* 7.9*  PHOS  --  5.9* 7.0*   Recent Labs  Lab 01/21/18 2230 01/23/18 0334 01/24/18 0548  AST 48*  --   --   ALT 39  --   --   ALKPHOS 198*  --   --   BILITOT 0.5  --   --   PROT 6.5  --   --   ALBUMIN 3.2* 3.1* 3.2*   Recent Labs  Lab 01/21/18 2230  01/23/18 0334 01/24/18 0548 01/25/18 0358  WBC 16.9*   < > 15.3* 13.4* 11.2*  NEUTROABS 9.3*  --   --   --   --   HGB 9.2*   < > 7.8* 7.4* 7.7*  HCT 28.3*   < > 23.7* 22.2* 23.4*  MCV 98.6   < >  98.8 97.4 97.9  PLT 291   < > 302 329 345   < > = values in this interval not displayed.   Iron/TIBC/Ferritin/ %Sat No results found for: IRON, TIBC, FERRITIN, IRONPCTSAT

## 2018-02-03 ENCOUNTER — Encounter: Payer: Self-pay | Admitting: *Deleted

## 2018-02-03 NOTE — Progress Notes (Signed)
Received faxed request from Dr. Arty Baumgartner / [facility] [phone]) to do a left arm fistulogram on this patient for decreased arterial flow and difficult cannulation. Patient has HD on M,W,F, second shift. I have scheduled this fistulogram for Dr. Bridgett Larsson on Thursday 02-05-18 at 1130. [facility] nurse will inform patient to arrive at 0930.

## 2018-02-05 ENCOUNTER — Encounter (HOSPITAL_COMMUNITY)
Admission: RE | Disposition: A | Discharge status: Home / Self Care | Payer: Self-pay | Source: Ambulatory Visit | Attending: Vascular Surgery

## 2018-02-05 ENCOUNTER — Ambulatory Visit (HOSPITAL_COMMUNITY)
Admission: RE | Admit: 2018-02-05 | Discharge: 2018-02-05 | Disposition: A | Discharge status: Home / Self Care | Payer: Medicare Other | Source: Ambulatory Visit | Attending: Vascular Surgery | Admitting: Vascular Surgery

## 2018-02-05 DIAGNOSIS — Z9889 Other specified postprocedural states: Secondary | ICD-10-CM | POA: Insufficient documentation

## 2018-02-05 DIAGNOSIS — I129 Hypertensive chronic kidney disease with stage 1 through stage 4 chronic kidney disease, or unspecified chronic kidney disease: Secondary | ICD-10-CM | POA: Insufficient documentation

## 2018-02-05 DIAGNOSIS — Z9049 Acquired absence of other specified parts of digestive tract: Secondary | ICD-10-CM | POA: Diagnosis not present

## 2018-02-05 DIAGNOSIS — Z880 Allergy status to penicillin: Secondary | ICD-10-CM | POA: Insufficient documentation

## 2018-02-05 DIAGNOSIS — F17210 Nicotine dependence, cigarettes, uncomplicated: Secondary | ICD-10-CM | POA: Insufficient documentation

## 2018-02-05 DIAGNOSIS — T82898A Other specified complication of vascular prosthetic devices, implants and grafts, initial encounter: Secondary | ICD-10-CM

## 2018-02-05 DIAGNOSIS — Z992 Dependence on renal dialysis: Secondary | ICD-10-CM

## 2018-02-05 DIAGNOSIS — T82858A Stenosis of vascular prosthetic devices, implants and grafts, initial encounter: Secondary | ICD-10-CM | POA: Diagnosis present

## 2018-02-05 DIAGNOSIS — Z8673 Personal history of transient ischemic attack (TIA), and cerebral infarction without residual deficits: Secondary | ICD-10-CM | POA: Diagnosis not present

## 2018-02-05 DIAGNOSIS — N189 Chronic kidney disease, unspecified: Secondary | ICD-10-CM | POA: Diagnosis not present

## 2018-02-05 DIAGNOSIS — Z886 Allergy status to analgesic agent status: Secondary | ICD-10-CM | POA: Insufficient documentation

## 2018-02-05 DIAGNOSIS — Z87442 Personal history of urinary calculi: Secondary | ICD-10-CM | POA: Diagnosis not present

## 2018-02-05 DIAGNOSIS — Y832 Surgical operation with anastomosis, bypass or graft as the cause of abnormal reaction of the patient, or of later complication, without mention of misadventure at the time of the procedure: Secondary | ICD-10-CM | POA: Insufficient documentation

## 2018-02-05 DIAGNOSIS — Z8249 Family history of ischemic heart disease and other diseases of the circulatory system: Secondary | ICD-10-CM | POA: Insufficient documentation

## 2018-02-05 DIAGNOSIS — Z9081 Acquired absence of spleen: Secondary | ICD-10-CM | POA: Diagnosis not present

## 2018-02-05 DIAGNOSIS — M1990 Unspecified osteoarthritis, unspecified site: Secondary | ICD-10-CM | POA: Insufficient documentation

## 2018-02-05 DIAGNOSIS — E1122 Type 2 diabetes mellitus with diabetic chronic kidney disease: Secondary | ICD-10-CM | POA: Insufficient documentation

## 2018-02-05 DIAGNOSIS — E1140 Type 2 diabetes mellitus with diabetic neuropathy, unspecified: Secondary | ICD-10-CM | POA: Diagnosis not present

## 2018-02-05 DIAGNOSIS — Z833 Family history of diabetes mellitus: Secondary | ICD-10-CM | POA: Insufficient documentation

## 2018-02-05 DIAGNOSIS — N186 End stage renal disease: Secondary | ICD-10-CM

## 2018-02-05 HISTORY — PX: A/V FISTULAGRAM: CATH118298

## 2018-02-05 HISTORY — PX: PERIPHERAL VASCULAR BALLOON ANGIOPLASTY: CATH118281

## 2018-02-05 LAB — POCT I-STAT, CHEM 8
BUN: 38 mg/dL — AB (ref 6–20)
CHLORIDE: 94 mmol/L — AB (ref 101–111)
CREATININE: 5.2 mg/dL — AB (ref 0.61–1.24)
Calcium, Ion: 1.06 mmol/L — ABNORMAL LOW (ref 1.15–1.40)
Glucose, Bld: 138 mg/dL — ABNORMAL HIGH (ref 65–99)
HCT: 28 % — ABNORMAL LOW (ref 39.0–52.0)
Hemoglobin: 9.5 g/dL — ABNORMAL LOW (ref 13.0–17.0)
Potassium: 3.9 mmol/L (ref 3.5–5.1)
Sodium: 135 mmol/L (ref 135–145)
TCO2: 29 mmol/L (ref 22–32)

## 2018-02-05 SURGERY — A/V FISTULAGRAM
Anesthesia: LOCAL

## 2018-02-05 MED ORDER — LIDOCAINE HCL (PF) 1 % IJ SOLN
INTRAMUSCULAR | Status: DC | PRN
  Administered 2018-02-05: 5 mL

## 2018-02-05 MED ORDER — ONDANSETRON HCL 4 MG/2ML IJ SOLN: 4.0000 mg | Freq: Four times a day (QID) | INTRAMUSCULAR | Status: DC | PRN

## 2018-02-05 MED ORDER — LABETALOL HCL 5 MG/ML IV SOLN: 10.0000 mg | INTRAVENOUS | Status: DC | PRN

## 2018-02-05 MED ORDER — SODIUM CHLORIDE 0.9% FLUSH: 3.0000 mL | Freq: Two times a day (BID) | INTRAVENOUS | Status: DC

## 2018-02-05 MED ORDER — LIDOCAINE HCL (PF) 1 % IJ SOLN
INTRAMUSCULAR | Status: AC
  Filled 2018-02-05: qty 30

## 2018-02-05 MED ORDER — IODIXANOL 320 MG/ML IV SOLN
INTRAVENOUS | Status: DC | PRN
  Administered 2018-02-05: 65 mL via INTRAVENOUS

## 2018-02-05 MED ORDER — FENTANYL CITRATE (PF) 100 MCG/2ML IJ SOLN
INTRAMUSCULAR | Status: DC | PRN
  Administered 2018-02-05: 50 ug via INTRAVENOUS

## 2018-02-05 MED ORDER — SODIUM CHLORIDE 0.9% FLUSH: 3.0000 mL | INTRAVENOUS | Status: DC | PRN

## 2018-02-05 MED ORDER — SODIUM CHLORIDE 0.9 % IV SOLN: 250.0000 mL | INTRAVENOUS | Status: DC | PRN

## 2018-02-05 MED ORDER — FENTANYL CITRATE (PF) 100 MCG/2ML IJ SOLN
INTRAMUSCULAR | Status: AC
  Filled 2018-02-05: qty 2

## 2018-02-05 MED ORDER — HEPARIN (PORCINE) IN NACL 1000-0.9 UT/500ML-% IV SOLN
INTRAVENOUS | Status: AC
  Filled 2018-02-05: qty 500

## 2018-02-05 MED ORDER — HEPARIN SODIUM (PORCINE) 1000 UNIT/ML IJ SOLN
INTRAMUSCULAR | Status: AC
  Filled 2018-02-05: qty 1

## 2018-02-05 MED ORDER — HEPARIN SODIUM (PORCINE) 1000 UNIT/ML IJ SOLN
INTRAMUSCULAR | Status: DC | PRN
  Administered 2018-02-05: 3000 [IU] via INTRAVENOUS

## 2018-02-05 MED ORDER — HYDRALAZINE HCL 20 MG/ML IJ SOLN: 5.0000 mg | INTRAMUSCULAR | Status: DC | PRN

## 2018-02-05 MED ORDER — HEPARIN (PORCINE) IN NACL 2-0.9 UNITS/ML
INTRAMUSCULAR | Status: AC | PRN
  Administered 2018-02-05: 500 mL

## 2018-02-05 MED ORDER — ACETAMINOPHEN 325 MG PO TABS: 650.0000 mg | ORAL_TABLET | ORAL | Status: DC | PRN

## 2018-02-05 SURGICAL SUPPLY — 18 items
BALLN MUSTANG 6X60X75 (BALLOONS) ×3
BALLOON MUSTANG 6X60X75 (BALLOONS) IMPLANT
CATH SOFT-VU 4F 65 STRAIGHT (CATHETERS) IMPLANT
CATH SOFT-VU STRAIGHT 4F 65CM (CATHETERS) ×3
COVER DOME SNAP 22 D (MISCELLANEOUS) ×3 IMPLANT
COVER PRB 48X5XTLSCP FOLD TPE (BAG) ×2 IMPLANT
COVER PROBE 5X48 (BAG) ×3
DEVICE TORQUE .025-.038 (MISCELLANEOUS) ×1 IMPLANT
GUIDEWIRE ANGLED .035X150CM (WIRE) ×1 IMPLANT
KIT ENCORE 26 ADVANTAGE (KITS) ×1 IMPLANT
KIT MICROPUNCTURE NIT STIFF (SHEATH) ×1 IMPLANT
PROTECTION STATION PRESSURIZED (MISCELLANEOUS) ×3
SHEATH PINNACLE R/O II 6F 4CM (SHEATH) ×1 IMPLANT
STATION PROTECTION PRESSURIZED (MISCELLANEOUS) ×2 IMPLANT
STOPCOCK MORSE 400PSI 3WAY (MISCELLANEOUS) ×3 IMPLANT
TRAY PV CATH (CUSTOM PROCEDURE TRAY) ×3 IMPLANT
TUBING CIL FLEX 10 FLL-RA (TUBING) ×3 IMPLANT
WIRE BENTSON .035X145CM (WIRE) ×1 IMPLANT

## 2018-02-05 NOTE — H&P (Signed)
Brief History and Physical  History of Present Illness   Brian [name] is a 57 y.o. male who presents with chief complaint: drop off in thrill in L RC AVF.  The patient presents today for L arm fistulogram, possible intervention  Past Medical History:  Diagnosis Date  . Arthritis   . Bleeding ulcer   . Chronic kidney disease    stage 5  . Complication of anesthesia    " I had a nose bleed a day or two after surgery-it could have been the oxygen"  . Diabetes mellitus without complication ([place])   . Diabetic neuropathy ([place])   . Elevated TSH   . History of kidney stones   . Hypertension   . Stroke [facility])    hemorrhagic 2010  . Tobacco abuse     Past Surgical History:  Procedure Laterality Date  . AV FISTULA PLACEMENT Left 05/16/2017   Procedure: ARTERIOVENOUS (AV) FISTULA CREATION-LEFT;  Surgeon: Angelia Mould, MD;  Location: [facility];  Service: Vascular;  Laterality: Left;  . CHOLECYSTECTOMY    . COLONOSCOPY    . ESOPHAGOGASTRODUODENOSCOPY N/A 01/23/2018   Procedure: ESOPHAGOGASTRODUODENOSCOPY (EGD);  Surgeon: Yetta Flock, MD;  Location: [facility] ENDOSCOPY;  Service: Gastroenterology;  Laterality: N/A;  . PANCREAS SURGERY    . SPLENECTOMY      Social History   Socioeconomic History  . Marital status: Widowed    Spouse name: Not on file  . Number of children: Not on file  . Years of education: Not on file  . Highest education level: Not on file  Occupational History  . Not on file  Social Needs  . Financial resource strain: Not on file  . Food insecurity:    Worry: Not on file    Inability: Not on file  . Transportation needs:    Medical: Not on file    Non-medical: Not on file  Tobacco Use  . Smoking status: Current Every Day Smoker    Packs/day: 0.50    Years: 30.00    Pack years: 15.00    Types: Cigarettes  . Smokeless tobacco: Never Used  Substance and Sexual Activity  . Alcohol use: No    Comment: quit around 2010  . Drug use: No  .  Sexual activity: Not on file  Lifestyle  . Physical activity:    Days per week: Not on file    Minutes per session: Not on file  . Stress: Not on file  Relationships  . Social connections:    Talks on phone: Not on file    Gets together: Not on file    Attends religious service: Not on file    Active member of club or organization: Not on file    Attends meetings of clubs or organizations: Not on file    Relationship status: Not on file  . Intimate partner violence:    Fear of current or ex partner: Not on file    Emotionally abused: Not on file    Physically abused: Not on file    Forced sexual activity: Not on file  Other Topics Concern  . Not on file  Social History Narrative  . Not on file    Family History  Problem Relation Age of Onset  . Lymphoma Mother   . Hypertension Father   . Diabetes Mellitus II Father   . Hypertension Brother   . Diabetes Mellitus I Brother     Current Facility-Administered Medications  Medication Dose Route  Frequency Provider Last Rate Last Dose  . 0.9 %  sodium chloride infusion  250 mL Intravenous PRN Conrad Barryton, MD      . sodium chloride flush (NS) 0.9 % injection 3 mL  3 mL Intravenous Q12H Adele Barthel L, MD      . sodium chloride flush (NS) 0.9 % injection 3 mL  3 mL Intravenous PRN Conrad Pace, MD        Allergies  Allergen Reactions  . Penicillins Hives and Rash    Has patient had a PCN reaction causing immediate rash, facial/tongue/throat swelling, SOB or lightheadedness with hypotension: Yes Has patient had a PCN reaction causing severe rash involving mucus membranes or skin necrosis: No Has patient had a PCN reaction that required hospitalization: No Has patient had a PCN reaction occurring within the last 10 years: No If all of the above answers are "NO", then may proceed with Cephalosporin use.   . Nsaids     Due to kidneys  . Aspirin Nausea And Vomiting    Other Reaction: GI Upset    Review of Systems: As listed  above, otherwise negative.   Physical Examination   Vitals:   02/05/18 0905  BP: (!) 151/90  Pulse: 91  Resp: 18  Temp: 98.4 F (36.9 C)  TempSrc: Oral  SpO2: 100%  Weight: 175 lb (79.4 kg)  Height: 5\' 9"  (1.753 m)   Body mass index is 25.84 kg/m.  General Alert, O x 3, WD, NAD  Pulmonary Sym exp, good B air movt, CTA B  Cardiac RRR, Nl S1, S2, no Murmurs, No rubs, No S3,S4  Musculo- skeletal L arm RC AVF with strong distal thrill and bruit, drop off in proximal segment thrill and bruit   Neurologic Pain and light touch intact in extremities ,     Laboratory  See iStat   Medical Decision Making   Brian [name] is a 57 y.o. male who presents with: likely venous stenosis in L RC AVF.   The patient is scheduled for: L arm fistulogram, possible intervention. I discussed with the patient the nature of angiographic procedures, especially the limited patencies of any endovascular intervention.  The patient is aware of that the risks of an angiographic procedure include but are not limited to: bleeding, infection, access site complications, renal failure, embolization, rupture of vessel, dissection, possible need for emergent surgical intervention, possible need for surgical procedures to treat the patient's pathology, and stroke and death.    The patient is aware of the risks and agrees to proceed.   Adele Barthel, MD, FACS [facility] Office: [phone] Pager: [phone]  02/05/2018, 10:59 AM

## 2018-02-05 NOTE — Op Note (Signed)
OPERATIVE NOTE   PROCEDURE: 1.  left radiocephalic arteriovenous fistula cannulation under ultrasound guidance 2.  right arm shuntogram 3.  Venoplasty of cephalic vein (6 mm x 60 mm)  PRE-OPERATIVE DIAGNOSIS: left radiocephalic arteriovenous fistula with poor flow rates   POST-OPERATIVE DIAGNOSIS: same as above   SURGEON: Adele Barthel, MD  ANESTHESIA: local  ESTIMATED BLOOD LOSS: 50 cc  FINDING(S): 1. Widely patent central venous structures 2. Basilic, brachial and cephalic vein meet at trifurcation to make left subclavian vein   3. Patent brachial, basilic and cephalic vein in upper arm: co-dominant drainage 4. Forearm cephalic drains into upper arm cephalic and basiliobrachial system through extensive web of veins 5. Mid-segment sub-total occlusion of cephalic vein and adjacent stenoses <50%: <30% residual stenosis with serial venoplasty 6. Improved thrill at end of case  SPECIMEN(S):  None  CONTRAST: 65 cc   INDICATIONS: Brian [name] is a 57 y.o. male who presents with left radiocephalic arteriovenous fistula with poor flow rates.  The patient is scheduled for left arm shuntogram and possible intervention.  The patient is aware of that the risks of an angiographic procedure include but are not limited to: bleeding, infection, access site complications, thrombosis of access, renal failure, embolization, rupture of vessel, dissection, arteriovenous fistula, possible need for emergent surgical intervention, possible need for surgical procedures to treat the patient's pathology, anaphylactic reaction to contrast, and stroke and death.  The patient is aware of the risks of the procedure and elects to proceed forward.   DESCRIPTION: After full informed written consent was obtained, the patient was brought back to the angiography suite and placed supine upon the angiography table.  The patient was connected to monitoring equipment.  The left forearm was prepped and draped in the  standard fashion for a percutaneous access intervention.  Under ultrasound guidance, the left radiocephalic arteriovenous fistula was cannulated with a micropuncture needle.  The fistula was noted to be widely patent.  Ultrasound image was permanently recorded.  The microwire was advanced into the fistula and the needle was exchanged for the a microsheath, which was lodged 2 cm into the access.  The wire was removed and the sheath was connected to the IV extension tubing.  Hand injections were completed to image the access from the cannulation site up the right atrium.  Based on the images, this patient will need: venoplasty of the forearm cephalic vein.  The patient was given 3000 units of Heparin intravenously to obtain some anticoagulation.  A Bentson wire was advanced into the mid-segment cephalic vein and the sheath was exchanged for a short 6-Fr sheath.  I used a Glidewire and 4-Fr straight catheter to steer through the stenotic segment.  The wire was exchanged for a Bentson wire.    Based on the imaging, a 6 mm x 60 mm angioplasty balloon was selected.  The balloon was centered around the stenoses and inflated to 10 ATM for 1 minute.  I did another inflation distally with some overlap at 10 ATM for 1 minute.  The patient had pain with these inflation so I gave 50 mcg of Fentanyl.  This patient.  On completion imaging, a <30% residual stenosis was present was the sub-total occlusion and the adjacent stenoses looked resolved.  The thrill in this fistula was greatly improved.  Based on the completion imaging, no further intervention is necessary.  The wire and balloon were removed from the sheath.  A 4-0 Monocryl purse-string suture was sewn around the sheath.  The sheath was removed while tying down the suture.  A sterile bandage was applied to the puncture site.   COMPLICATIONS: none  CONDITION: stable   Adele Barthel, MD, [facility] [place] Office: [phone] Pager:  [phone]  02/05/2018 1:53 PM

## 2018-02-05 NOTE — Discharge Instructions (Signed)
**Note -identified via Obfuscation** Fistulogram, Care After °Refer to this sheet in the next few weeks. These instructions provide you with information on caring for yourself after your procedure. Your health care provider may also give you more specific instructions. Your treatment has been planned according to current medical practices, but problems sometimes occur. Call your health care provider if you have any problems or questions after your procedure. °What can I expect after the procedure? °After your procedure, it is typical to have the following: °· A small amount of discomfort in the area where the catheters were placed. °· A small amount of bruising around the fistula. °· Sleepiness and fatigue. ° °Follow these instructions at home: °· Rest at home for the day following your procedure. °· Do not drive or operate heavy machinery while taking pain medicine. °· Take medicines only as directed by your health care provider. °· Do not take baths, swim, or use a hot tub until your health care provider approves. You may shower 24 hours after the procedure or as directed by your health care provider. °· There are many different ways to close and cover an incision, including stitches, skin glue, and adhesive strips. Follow your health care provider's instructions on: °? Incision care. °? Bandage (dressing) changes and removal. °? Incision closure removal. °· Monitor your dialysis fistula carefully. °Contact a health care provider if: °· You have drainage, redness, swelling, or pain at your catheter site. °· You have a fever. °· You have chills. °Get help right away if: °· You feel weak. °· You have trouble balancing. °· You have trouble moving your arms or legs. °· You have problems with your speech or vision. °· You can no longer feel a vibration or buzz when you put your fingers over your dialysis fistula. °· The limb that was used for the procedure: °? Swells. °? Is painful. °? Is cold. °? Is discolored, such as blue or pale white. °This  information is not intended to replace advice given to you by your health care provider. Make sure you discuss any questions you have with your health care provider. °Document Released: 02/07/2014 Document Revised: 02/29/2016 Document Reviewed: 11/12/2013 °Elsevier Interactive Patient Education © 2018 [employer] ° °

## 2018-02-06 ENCOUNTER — Encounter (HOSPITAL_COMMUNITY): Payer: Self-pay | Admitting: Vascular Surgery

## 2018-02-10 ENCOUNTER — Ambulatory Visit (INDEPENDENT_AMBULATORY_CARE_PROVIDER_SITE_OTHER): Payer: Medicare Other | Admitting: Physician Assistant

## 2018-02-10 ENCOUNTER — Encounter: Payer: Self-pay | Admitting: Physician Assistant

## 2018-02-10 VITALS — BP 100/82 | HR 90 | Ht 69.0 in | Wt 177.0 lb

## 2018-02-10 DIAGNOSIS — Z1211 Encounter for screening for malignant neoplasm of colon: Secondary | ICD-10-CM

## 2018-02-10 DIAGNOSIS — K264 Chronic or unspecified duodenal ulcer with hemorrhage: Secondary | ICD-10-CM

## 2018-02-10 DIAGNOSIS — D649 Anemia, unspecified: Secondary | ICD-10-CM | POA: Diagnosis not present

## 2018-02-10 MED ORDER — PANTOPRAZOLE SODIUM 40 MG PO TBEC: DELAYED_RELEASE_TABLET | ORAL | 11 refills | Status: DC

## 2018-02-10 NOTE — Progress Notes (Signed)
Agree with assessment and plan as outlined.  

## 2018-02-10 NOTE — Patient Instructions (Signed)
If you are age 55 or younger, your body mass index should be between 19-25. Your Body mass index is 26.14 kg/m. If this is out of the aformentioned range listed, please consider follow up with your Primary Care Provider.  We have sent the following medications to your pharmacy for you to pick up at your convenience: 1. Pantoprazole sodium 40 mg.  We have provided you with a colonoscopy prep sample.   You have been scheduled for a colonoscopy. Please follow written instructions given to you at your visit today.  Please pick up your prep supplies at the pharmacy within the next 1-3 days. If you use inhalers (even only as needed), please bring them with you on the day of your procedure. Your physician has requested that you go to www.startemmi.com and enter the access code given to you at your visit today. This web site gives a general overview about your procedure. However, you should still follow specific instructions given to you by our office regarding your preparation for the procedure.

## 2018-02-10 NOTE — Progress Notes (Signed)
Subjective:    Patient ID: Brian [name], male    DOB: 19-Aug-1961, 57 y.o.   MRN: [id number]  HPI Brian [name] is a pleasant 57 year old white male, recently known to Dr. Havery Moros from hospital consultation.  He was admitted 01/22/2018 with an acute upper GI bleed.  Patient has history of end-stage renal disease, on dialysis, type 1 diabetes, history of hemorrhagic CVA 2010, remote history of duodenal ulcer, hypertension, prior history of pancreatitis, history of CHF with EF of 60%, and status post cholecystectomy. Patient underwent EGD on 01/23/2018 with finding of a duodenal ulcer with visible vessel this was treated with gold probe with hemostasis.  Biopsies were done and were negative for H. pylori.  He was discharged home on twice daily Protonix 40 mg. Hemoglobin was 7.7 on discharge.  Patient tells me today he had his hemoglobin checked through his PCP Dr. Arelia Sneddon 1 week after discharge and hemoglobin was up to 9.1. He says he has been feeling fine, he has no complaints of abdominal discomfort nausea or indigestion.  He has not seen any further melena or hematochezia and bowels have been normal. Patient is in the process of trying to get listed for kidney transplant through [facility].  He needs to have a screening colonoscopy as part of that process. Family history is negative for colon cancer and polyps as far as he is aware.  Review of Systems Pertinent positive and negative review of systems were noted in the above HPI section.  All other review of systems was otherwise negative.  Outpatient Encounter Medications as of 02/10/2018  Medication Sig  . acetaminophen (TYLENOL) 325 MG tablet Take 650 mg by mouth every 6 (six) hours as needed.  Marland Kitchen amLODipine (NORVASC) 10 MG tablet Take 10 mg by mouth daily.  . B Complex-C-Folic Acid (RENA-VITE PO) Take 1 tablet by mouth daily.  . Cholecalciferol (VITAMIN D) 2000 units tablet Take 4,000 Units by mouth daily.   . insulin aspart  protamine- aspart (NOVOLOG MIX 70/30) (70-30) 100 UNIT/ML injection Inject 0.05 mLs (5 Units total) into the skin at bedtime. (Patient taking differently: Inject 5-10 Units into the skin at bedtime. Depending on blood sugar.)  . pantoprazole (PROTONIX) 40 MG tablet Take 1 tab by mouth  every morning.  . [DISCONTINUED] pantoprazole (PROTONIX) 40 MG tablet Take 1 tablet (40 mg total) by mouth 2 (two) times daily. On 4/19 start taking once daily   No facility-administered encounter medications on file as of 02/10/2018.    Allergies  Allergen Reactions  . Penicillins Hives and Rash    Has patient had a PCN reaction causing immediate rash, facial/tongue/throat swelling, SOB or lightheadedness with hypotension: Yes Has patient had a PCN reaction causing severe rash involving mucus membranes or skin necrosis: No Has patient had a PCN reaction that required hospitalization: No Has patient had a PCN reaction occurring within the last 10 years: No If all of the above answers are "NO", then may proceed with Cephalosporin use.   . Nsaids     Due to kidneys  . Aspirin Nausea And Vomiting    Other Reaction: GI Upset   Patient Active Problem List   Diagnosis Date Noted  . Gastritis 01/23/2018  . Acute blood loss anemia 01/23/2018  . Duodenal ulcer   . Upper GI bleed 01/22/2018  . Type 1 diabetes mellitus with kidney complication ([place]) - on insulin 05/01/2017  . Hemorrhagic stroke [facility])- Jan 2010 05/01/2017  . H/O hyperlipidemia- sev yrs ago 05/01/2017  .  h/o Hypertriglyceridemia- 13000's in past 05/01/2017  . H/O splenectomy 05/01/2017  . Status post cholecystectomy 05/01/2017  . Gout- L foot- mult times 05/01/2017  . Diabetic neuropathy ([place])- 6 mo or so; never started meds 05/01/2017  . Vitamin D deficiency 05/01/2017  . ESRD on dialysis ([place]) 02/05/2017  . Anasarca- may 2018 hosp 02/05/2017  . Leukocytosis- s/p plenectomy 02/05/2017  . Hypertension   . Diabetes mellitus without complication  ([place])   . Tobacco abuse- >er than 55 pack yr hx; current smoker    Social History   Socioeconomic History  . Marital status: Widowed    Spouse name: Not on file  . Number of children: Not on file  . Years of education: Not on file  . Highest education level: Not on file  Occupational History  . Not on file  Social Needs  . Financial resource strain: Not on file  . Food insecurity:    Worry: Not on file    Inability: Not on file  . Transportation needs:    Medical: Not on file    Non-medical: Not on file  Tobacco Use  . Smoking status: Current Every Day Smoker    Packs/day: 0.50    Years: 30.00    Pack years: 15.00    Types: Cigarettes  . Smokeless tobacco: Never Used  Substance and Sexual Activity  . Alcohol use: No    Comment: quit around 2010  . Drug use: No  . Sexual activity: Not on file  Lifestyle  . Physical activity:    Days per week: Not on file    Minutes per session: Not on file  . Stress: Not on file  Relationships  . Social connections:    Talks on phone: Not on file    Gets together: Not on file    Attends religious service: Not on file    Active member of club or organization: Not on file    Attends meetings of clubs or organizations: Not on file    Relationship status: Not on file  . Intimate partner violence:    Fear of current or ex partner: Not on file    Emotionally abused: Not on file    Physically abused: Not on file    Forced sexual activity: Not on file  Other Topics Concern  . Not on file  Social History Narrative  . Not on file    Brian [name] family history includes Diabetes Mellitus I in his brother; Diabetes Mellitus II in his father; Hypertension in his brother, brother, and father; Lymphoma in his mother.      Objective:    Vitals:   02/10/18 1032  BP: 100/82  Pulse: 90    Physical Exam; well-developed white male in no acute distress, pleasant blood pressure 100/82, pulse 90, height 5 foot 9, weight 177, BMI 26.1.   HEENT; nontraumatic normocephalic EOMI PERRLA sclera anicteric, oropharynx benign Cardiovascular; regular rate and rhythm with S1-S2 no murmur rub or gallop, Pulmonary; clear bilaterally, Abdomen ;soft, nontender nondistended bowel sounds are active there is no palpable mass or hepatosplenomegaly Rectal ;exam not done, Extremities; no clubbing cyanosis or edema skin warm and dry, Neuro psych ;alert and oriented x3, grossly nonfocal mood and affect appropriate       Assessment & Plan:   #66 57 year old white male with history of recent hospitalization for acute upper GI bleed secondary to duodenal ulcer with visible vessel. Biopsies negative for H. pylori, no NSAID use. Patient is currently doing well  on twice daily Protonix  #2 acute on chronic anemia secondary to upper GI bleed- #3 end-stage renal disease/on dialysis #4 hypertension #5 diabetes mellitus with diabetic nephropathy #6 history of CVA 2010 hemorrhagic #7 prior history of pancreatitis #8 that is post cholecystectomy  #9 colon cancer screening-patient may have had remote colonoscopy greater than 10 years ago.  He is due for colon cancer surveillance and needs preop colonoscopy done prior to potential listening for kidney transplant.  Plan; Patient will continue twice daily Protonix for 2 more weeks then decrease to 40 mg p.o. every morning long-term.   Refills sent . We discussed ongoing avoidance of aspirin and NSAIDs. Patient will be scheduled for colonoscopy with Dr. Havery Moros.  Procedure was discussed in detail with the patient including indications risks and benefits and he is agreeable to proceed.    Amy S Esterwood PA-C 02/10/2018   Cc: Donato Heinz, MD

## 2018-02-19 ENCOUNTER — Encounter: Payer: Self-pay | Admitting: Gastroenterology

## 2018-02-19 ENCOUNTER — Ambulatory Visit (AMBULATORY_SURGERY_CENTER): Payer: Medicare Other | Admitting: Gastroenterology

## 2018-02-19 VITALS — BP 136/74 | HR 92 | Temp 99.6°F | Resp 17 | Ht 69.0 in | Wt 177.0 lb

## 2018-02-19 DIAGNOSIS — D122 Benign neoplasm of ascending colon: Secondary | ICD-10-CM

## 2018-02-19 DIAGNOSIS — D123 Benign neoplasm of transverse colon: Secondary | ICD-10-CM

## 2018-02-19 DIAGNOSIS — D120 Benign neoplasm of cecum: Secondary | ICD-10-CM | POA: Diagnosis not present

## 2018-02-19 DIAGNOSIS — Z1211 Encounter for screening for malignant neoplasm of colon: Secondary | ICD-10-CM

## 2018-02-19 MED ORDER — SODIUM CHLORIDE 0.9 % IV SOLN: 500.0000 mL | Freq: Once | INTRAVENOUS | Status: DC

## 2018-02-19 NOTE — Op Note (Addendum)
[place] Patient Name: Brian [name] Procedure Date: 02/19/2018 1:56 PM MRN: [id number] Endoscopist: Remo Lipps P. Handy Mcloud MD, MD Age: 57 Referring MD:  Date of Birth: 22-Oct-1960 Gender: Male Account #: [id number] Procedure:                Colonoscopy Indications:              Screening for colorectal malignant neoplasm Medicines:                Monitored Anesthesia Care Procedure:                Pre-Anesthesia Assessment:                           - Prior to the procedure, a History and Physical                            was performed, and patient medications and                            allergies were reviewed. The patient's tolerance of                            previous anesthesia was also reviewed. The risks                            and benefits of the procedure and the sedation                            options and risks were discussed with the patient.                            All questions were answered, and informed consent                            was obtained. Prior Anticoagulants: The patient has                            taken no previous anticoagulant or antiplatelet                            agents. ASA Grade Assessment: III - A patient with                            severe systemic disease. After reviewing the risks                            and benefits, the patient was deemed in                            satisfactory condition to undergo the procedure.                           After obtaining informed consent, the colonoscope  was passed under direct vision. Throughout the                            procedure, the patient's blood pressure, pulse, and                            oxygen saturations were monitored continuously. The                            Colonoscope was introduced through the anus and                            advanced to the the cecum, identified by                            appendiceal orifice  and ileocecal valve. The                            colonoscopy was performed without difficulty. The                            patient tolerated the procedure well. The quality                            of the bowel preparation was good. The ileocecal                            valve, appendiceal orifice, and rectum were                            photographed. Scope In: 2:08:23 PM Scope Out: 2:35:57 PM Scope Withdrawal Time: 0 hours 22 minutes 34 seconds  Total Procedure Duration: 0 hours 27 minutes 34 seconds  Findings:                 The perianal and digital rectal examinations were                            normal.                           A 5 mm polyp was found in the ileocecal valve. The                            polyp was sessile. The polyp was removed with a                            cold snare. Resection and retrieval were complete.                           Three sessile polyps were found in the ascending                            colon. The polyps were 4 to 8 mm in size. These  polyps were removed with a cold snare. Resection                            and retrieval were complete.                           There was a medium-sized lipoma, in the ascending                            colon.                           A 4 mm polyp was found in the transverse colon. The                            polyp was sessile. The polyp was removed with a                            cold snare. Resection and retrieval were complete.                           A few medium-mouthed diverticula were found in the                            ascending colon and cecum.                           Internal hemorrhoids were found during retroflexion.                           The colon was extremely spastic which prolonged the                            procedure. The exam was otherwise without                            abnormality. Complications:            No  immediate complications. Estimated blood loss:                            Minimal. Estimated Blood Loss:     Estimated blood loss was minimal. Impression:               - One 5 mm polyp at the ileocecal valve, removed                            with a cold snare. Resected and retrieved.                           - Three 4 to 8 mm polyps in the ascending colon,                            removed with a cold snare. Resected and retrieved.                           -  Medium-sized lipoma in the ascending colon.                           - One 4 mm polyp in the transverse colon, removed                            with a cold snare. Resected and retrieved.                           - Diverticulosis in the ascending colon and in the                            cecum.                           - Internal hemorrhoids.                           - Extremely spastic colon.                           - The examination was otherwise normal. Recommendation:           - Patient has a contact number available for                            emergencies. The signs and symptoms of potential                            delayed complications were discussed with the                            patient. Return to normal activities tomorrow.                            Written discharge instructions were provided to the                            patient.                           - Resume previous diet.                           - Continue present medications.                           - Await pathology results.                           - Repeat colonoscopy for surveillance pending                            pathology results. Remo Lipps P. Tahirah Sara MD, MD 02/19/2018 2:42:14 PM This report has been signed electronically.

## 2018-02-19 NOTE — Patient Instructions (Signed)
Await pathology on 5 polyps. Continue present medication. Start out with low-gas producing meal (no spicy, raw, greasy foods for first meal).  Breakfast foods such as eggs , grits, toast, pancakes are good choices or bread and lean meats.    YOU HAD AN ENDOSCOPIC PROCEDURE TODAY AT [facility]:   Refer to the procedure report that was given to you for any specific questions about what was found during the examination.  If the procedure report does not answer your questions, please call your gastroenterologist to clarify.  If you requested that your care partner not be given the details of your procedure findings, then the procedure report has been included in a sealed envelope for you to review at your convenience later.  YOU SHOULD EXPECT: Some feelings of bloating in the abdomen. Passage of more gas than usual.  Walking can help get rid of the air that was put into your GI tract during the procedure and reduce the bloating. If you had a lower endoscopy (such as a colonoscopy or flexible sigmoidoscopy) you may notice spotting of blood in your stool or on the toilet paper. If you underwent a bowel prep for your procedure, you may not have a normal bowel movement for a few days.  Please Note:  You might notice some irritation and congestion in your nose or some drainage.  This is from the oxygen used during your procedure.  There is no need for concern and it should clear up in a day or so.  SYMPTOMS TO REPORT IMMEDIATELY:   Following lower endoscopy (colonoscopy or flexible sigmoidoscopy):  Excessive amounts of blood in the stool  Significant tenderness or worsening of abdominal pains  Swelling of the abdomen that is new, acute  Fever of 100F or higher    For urgent or emergent issues, a gastroenterologist can be reached at any hour by calling [phone].   DIET:  We do recommend a small meal at first, but then you may proceed to your regular diet.  Drink plenty of  fluids but you should avoid alcoholic beverages for 24 hours.  ACTIVITY:  You should plan to take it easy for the rest of today and you should NOT DRIVE or use heavy machinery until tomorrow (because of the sedation medicines used during the test).    FOLLOW UP: Our staff will call the number listed on your records the next business day following your procedure to check on you and address any questions or concerns that you may have regarding the information given to you following your procedure. If we do not reach you, we will leave a message.  However, if you are feeling well and you are not experiencing any problems, there is no need to return our call.  We will assume that you have returned to your regular daily activities without incident.  If any biopsies were taken you will be contacted by phone or by letter within the next 1-3 weeks.  Please call us at [phone] if you have not heard about the biopsies in 3 weeks.    SIGNATURES/CONFIDENTIALITY: You and/or your care partner have signed paperwork which will be entered into your electronic medical record.  These signatures attest to the fact that that the information above on your After Visit Summary has been reviewed and is understood.  Full responsibility of the confidentiality of this discharge information lies with you and/or your care-partner.

## 2018-02-19 NOTE — Progress Notes (Signed)
Called to room to assist during endoscopic procedure.  Patient ID and intended procedure confirmed with present staff. Received instructions for my participation in the procedure from the performing physician.  

## 2018-02-19 NOTE — Progress Notes (Signed)
A and O x3. Report to RN. Tolerated MAC anesthesia well.

## 2018-02-19 NOTE — Progress Notes (Signed)
Fistula to left lower arm.

## 2018-02-19 NOTE — Progress Notes (Signed)
Pt's states no medical or surgical changes since previsit or office visit. 

## 2018-02-20 ENCOUNTER — Telehealth: Payer: Self-pay | Admitting: *Deleted

## 2018-02-20 ENCOUNTER — Telehealth: Payer: Self-pay

## 2018-02-20 NOTE — Telephone Encounter (Signed)
Attempted to reach patient for post-procedure f/u call. No answer. Left message that we will make another attempt to reach him again later today and for him to please not hesitate to call us if he has any questions/concerns regarding his care. 

## 2018-02-20 NOTE — Telephone Encounter (Signed)
No answer. Name identifier. Message left to call if questions or concerns. 

## 2018-02-25 ENCOUNTER — Encounter: Payer: Self-pay | Admitting: Gastroenterology

## 2018-07-01 DIAGNOSIS — Z23 Encounter for immunization: Secondary | ICD-10-CM | POA: Insufficient documentation

## 2018-07-16 DIAGNOSIS — K7460 Unspecified cirrhosis of liver: Secondary | ICD-10-CM | POA: Insufficient documentation

## 2018-07-28 DIAGNOSIS — Z4802 Encounter for removal of sutures: Secondary | ICD-10-CM | POA: Insufficient documentation

## 2019-02-15 ENCOUNTER — Other Ambulatory Visit: Payer: Self-pay | Admitting: Physician Assistant

## 2019-03-18 ENCOUNTER — Other Ambulatory Visit: Payer: Self-pay | Admitting: Gastroenterology

## 2019-05-18 ENCOUNTER — Other Ambulatory Visit: Payer: Self-pay | Admitting: Gastroenterology

## 2019-05-21 ENCOUNTER — Other Ambulatory Visit: Payer: Self-pay

## 2019-05-21 ENCOUNTER — Telehealth: Payer: Self-pay | Admitting: Gastroenterology

## 2019-05-21 MED ORDER — PANTOPRAZOLE SODIUM 40 MG PO TBEC: DELAYED_RELEASE_TABLET | ORAL | 1 refills | Status: DC

## 2019-05-21 MED ORDER — PANTOPRAZOLE SODIUM 40 MG PO TBEC: 40.0000 mg | DELAYED_RELEASE_TABLET | Freq: Every day | ORAL | 1 refills | Status: DC

## 2019-06-29 ENCOUNTER — Ambulatory Visit (INDEPENDENT_AMBULATORY_CARE_PROVIDER_SITE_OTHER): Payer: Medicare Other | Admitting: Gastroenterology

## 2019-06-29 ENCOUNTER — Other Ambulatory Visit: Payer: Self-pay

## 2019-06-29 VITALS — BP 142/80 | HR 68 | Temp 98.9°F | Ht 69.0 in | Wt 185.0 lb

## 2019-06-29 DIAGNOSIS — N186 End stage renal disease: Secondary | ICD-10-CM | POA: Diagnosis not present

## 2019-06-29 DIAGNOSIS — Z86010 Personal history of colonic polyps: Secondary | ICD-10-CM

## 2019-06-29 DIAGNOSIS — Z8719 Personal history of other diseases of the digestive system: Secondary | ICD-10-CM

## 2019-06-29 DIAGNOSIS — Z720 Tobacco use: Secondary | ICD-10-CM | POA: Diagnosis not present

## 2019-06-29 DIAGNOSIS — K7460 Unspecified cirrhosis of liver: Secondary | ICD-10-CM

## 2019-06-29 MED ORDER — PANTOPRAZOLE SODIUM 20 MG PO TBEC: 20.0000 mg | DELAYED_RELEASE_TABLET | Freq: Every day | ORAL | 3 refills | Status: DC

## 2019-06-29 NOTE — Progress Notes (Signed)
HPI :  58 year old male here for follow-up visit.  He has a history of compensated cirrhosis (cryptogenic), end-stage renal disease on hemodialysis,pancreatic disease (s/p partial pancreatectomy & splenectomy), duodenal ulcer, here for follow-up visit.  I met him when he was hospitalized in April 2019 for an upper GI bleed.  EGD at that time showed gastritis along with a cratered duodenal ulcer with visible vessel which we cauterized.  Biopsies from the EGD were negative for H. pylori.  He was treated with high-dose Protonix initially which was then reduced to once daily at 40 mg.  He has continued that dose.  He completely has avoided NSAIDs, only using Tylenol as needed for pain.  He denies any issues with reflux or regurgitation.  No dysphasia.  He denies any problems with his bowel habits.  He denies any abdominal pains.  No blood in his stools.  He has been followed by [facility] hepatology clinic for cirrhosis.  He has not had any decompensations that we are aware of.  His last EGD showed no esophageal varices.  Given his end-stage renal disease he has been considered for renal transplant, and in light of his history of cirrhosis he is being evaluated for dual liver kidney transplant.  He was referred to [facility] for this as [facility] does not do double organ transplants.  He is currently not a candidate for transplant at [facility] due to his ongoing tobacco use.  He states he continues to smoke cigarettes routinely and is having a hard time quitting. Serologic evaluation was negative for chronic viral hepatitis, autoimmune hepatitis, PBC, Wilson disease, and alpha-1 antitrypsin deficiency. Ferritin and % saturation have been non-specifically elevated, with negative HFE testing. Transjugular liver biopsy on 06/04/2018 (detailed below) demonstrated HVPG 13 mmHg (c/w CSPH) with histology demonstrating chronic active hepatitis with bridging fibrosis/cirrhosis and secondary iron accumulation.  He denies any lower  extremity edema, no ascites.  He has not had any jaundice.  No history of hepatic encephalopathy or esophageal varices.  He had an ultrasound of his abdomen in May 2020 which did not show any new mass lesions.  He has his blood drawn regularly by the dialysis clinic, records of that are not available today.  His flu shot is pending with the dialysis clinic.  Last colonoscopy was in 2019 as below, he had 5 adenomas removed.  EGD 01/23/18 - Esophagogastric landmarks identified. - 3 cm hiatal hernia. - Normal esophagus - Gastritis with heme noted throughout the stomach. Biopsied to rule out H pylori. - One cratered duodenal ulcer with 2 red spots, suspected vessels. Treated with a monopolar probe to prevent rebleeding.  Biopsies negative for H pylori  Colonoscopy 02/19/18 - The perianal and digital rectal examinations were normal. - A 5 mm polyp was found in the ileocecal valve. The polyp was sessile. The polyp was removed with a cold snare. Resection and retrieval were complete. - Three sessile polyps were found in the ascending colon. The polyps were 4 to 8 mm in size. These polyps were removed with a cold snare. Resection and retrieval were complete. - There was a medium-sized lipoma, in the ascending colon. - A 4 mm polyp was found in the transverse colon. The polyp was sessile. The polyp was removed with a cold snare. Resection and retrieval were complete. - A few medium-mouthed diverticula were found in the ascending colon and cecum. - Internal hemorrhoids were found during retroflexion. - The colon was extremely spastic which prolonged the procedure. The exam  was otherwise without abnormality.  - all adenomas - repeat colonoscopy in 3 years   Past Medical History:  Diagnosis Date  . Arthritis   . Bleeding ulcer   . Chronic kidney disease    stage 5  . Complication of anesthesia    " I had a nose bleed a day or two after surgery-it could have been the oxygen"  . Diabetes  mellitus without complication ([place])   . Diabetic neuropathy ([place])   . Elevated TSH   . History of kidney stones   . Hypertension   . Stroke [facility])    hemorrhagic 2010  . Tobacco abuse      Past Surgical History:  Procedure Laterality Date  . A/V FISTULAGRAM N/A 02/05/2018   Procedure: A/V FISTULAGRAM;  Surgeon: Conrad Okmulgee, MD;  Location: [place] [facility];  Service: Cardiovascular;  Laterality: N/A;  . AV FISTULA PLACEMENT Left 05/16/2017   Procedure: ARTERIOVENOUS (AV) FISTULA CREATION-LEFT;  Surgeon: Angelia Mould, MD;  Location: [place];  Service: Vascular;  Laterality: Left;  . CHOLECYSTECTOMY    . COLONOSCOPY    . ESOPHAGOGASTRODUODENOSCOPY N/A 01/23/2018   Procedure: ESOPHAGOGASTRODUODENOSCOPY (EGD);  Surgeon: Yetta Flock, MD;  Location: [facility] ENDOSCOPY;  Service: Gastroenterology;  Laterality: N/A;  . PANCREAS SURGERY    . PERIPHERAL VASCULAR BALLOON ANGIOPLASTY  02/05/2018   Procedure: PERIPHERAL VASCULAR BALLOON ANGIOPLASTY;  Surgeon: Conrad Gresham, MD;  Location: [place] [facility];  Service: Cardiovascular;;  left AV fistula  . SPLENECTOMY     Family History  Problem Relation Age of Onset  . Lymphoma Mother   . Hypertension Father   . Diabetes Mellitus II Father   . Hypertension Brother   . Diabetes Mellitus I Brother   . Hypertension Brother   . Colon cancer Neg Hx   . Stomach cancer Neg Hx    Social History   Tobacco Use  . Smoking status: Current Every Day Smoker    Packs/day: 0.50    Years: 30.00    Pack years: 15.00    Types: Cigarettes  . Smokeless tobacco: Never Used  Substance Use Topics  . Alcohol use: No    Comment: quit around 2010  . Drug use: No   Current Outpatient Medications  Medication Sig Dispense Refill  . acetaminophen (TYLENOL) 325 MG tablet Take 650 mg by mouth every 6 (six) hours as needed.    . B Complex-C-Folic Acid (RENA-VITE PO) Take 1 tablet by mouth daily.    . ferric citrate (AURYXIA) 1 GM 210 MG(Fe) tablet Take  2 tablets by mouth 3 (three) times daily.    . heparin 1000 UNIT/ML injection Inject 1,000 Units into the vein 3 (three) times a week. With dialysis    . insulin aspart protamine- aspart (NOVOLOG MIX 70/30) (70-30) 100 UNIT/ML injection Inject 0.05 mLs (5 Units total) into the skin at bedtime. (Patient taking differently: Inject 5-10 Units into the skin at bedtime. Depending on blood sugar.) 10 mL 0  . pantoprazole (PROTONIX) 40 MG tablet Take 1 tablet (40 mg total) by mouth daily. Please keep your September appointment for further refills. 30 tablet 1   Current Facility-Administered Medications  Medication Dose Route Frequency Provider Last Rate Last Dose  . 0.9 %  sodium chloride infusion  500 mL Intravenous Once Armbruster, Carlota Raspberry, MD       Allergies  Allergen Reactions  . Penicillins Hives and Rash    Has patient had a PCN reaction causing immediate  rash, facial/tongue/throat swelling, SOB or lightheadedness with hypotension: Yes Has patient had a PCN reaction causing severe rash involving mucus membranes or skin necrosis: No Has patient had a PCN reaction that required hospitalization: No Has patient had a PCN reaction occurring within the last 10 years: No If all of the above answers are "NO", then may proceed with Cephalosporin use.   . Nsaids     Due to kidneys  . Aspirin Nausea And Vomiting    Other Reaction: GI Upset     Review of Systems: All systems reviewed and negative except where noted in HPI.   Lab Results  Component Value Date   WBC 11.2 (H) 01/25/2018   HGB 9.5 (L) 02/05/2018   HCT 28.0 (L) 02/05/2018   MCV 97.9 01/25/2018   PLT 345 01/25/2018    Lab Results  Component Value Date   CREATININE 5.20 (H) 02/05/2018   BUN 38 (H) 02/05/2018   NA 135 02/05/2018   K 3.9 02/05/2018   CL 94 (L) 02/05/2018   CO2 19 (L) 01/24/2018    Lab Results  Component Value Date   ALT 39 01/21/2018   AST 48 (H) 01/21/2018   ALKPHOS 198 (H) 01/21/2018   BILITOT 0.5  01/21/2018     Physical Exam: BP (!) 142/80   Pulse 68   Temp 98.9 F (37.2 C)   Ht _0  (1.753 m)   Wt 185 lb (83.9 kg)   BMI 27.32 kg/m  Constitutional: Pleasant,well-developed, male in no acute distress. HEENT: Normocephalic and atraumatic. Conjunctivae are normal. No scleral icterus. Neck supple.  Cardiovascular: Normal rate, regular rhythm.  Pulmonary/chest: Effort normal and breath sounds normal. No wheezing, rales or rhonchi. Abdominal: Soft, nondistended, nontender. There are no masses palpable. No hepatomegaly. Extremities: no edema Lymphadenopathy: No cervical adenopathy noted. Neurological: Alert and oriented to person place and time. Skin: Skin is warm and dry. No rashes noted. Psychiatric: Normal mood and affect. Behavior is normal.   ASSESSMENT AND PLAN: 58 year old male here for reassessment of the following:  History of duodenal ulcer - recovered without recurrence, biopsies for H. pylori negative.  He has been on Protonix once daily since that time at 40 mg.  We discussed long-term risk benefits of PPI use.  We will dose reduce to 20 mg once a day and see how he does on that regimen, use lowest dose needed.  If he has worsening reflux or abdominal discomfort with lowering dose can increase back up as needed. Will reassess his bowel during EGD needed for varices screening in April.  Cirrhosis without ascites / End stage renal disease / Tobacco use - cryptogenic cirrhosis, compensated, following with [facility] for consideration of dual kidney and liver transplant given his end-stage renal disease.  He currently is not a candidate for transplant with ongoing tobacco use and is working on cessation although has not been able to stop.  He has follow-up with [facility] in a few months.  He is due for an EGD with [place] in April 2021 for varices screening.  Otherwise his blood work he states is up-to-date and done at the dialysis center.  He is due for an ultrasound and AFP in November for  [facility] screening, he will get that done at [facility].  His flu shot will be done soon at his dialysis center.  History of colon polyps - due for surveillance colonoscopy 2022.  [place] Cellar, MD [facility] Gastroenterology

## 2019-06-29 NOTE — Patient Instructions (Signed)
If you are age 56 or older, your body mass index should be between 23-30. Your Body mass index is 27.32 kg/m. If this is out of the aforementioned range listed, please consider follow up with your Primary Care Provider.  If you are age 47 or younger, your body mass index should be between 19-25. Your Body mass index is 27.32 kg/m. If this is out of the aformentioned range listed, please consider follow up with your Primary Care Provider.   To help prevent the possible spread of infection to our patients, communities, and staff; we will be implementing the following measures:  As of now we are not allowing any visitors/family members to accompany you to any upcoming appointments with [facility]. If you have any concerns about this please contact our office to discuss prior to the appointment.   We have sent the following medications to your pharmacy for you to pick up at your convenience: Protonix 20mg : Take once daily    You will be due for a recall endoscopy in April of 2021. We will send you a reminder in the mail when it gets closer to that time.  Thank you for entrusting me with your care and for choosing [facility], Dr. [place] Cellar

## 2019-08-16 DIAGNOSIS — R197 Diarrhea, unspecified: Secondary | ICD-10-CM | POA: Insufficient documentation

## 2019-09-10 DIAGNOSIS — R519 Headache, unspecified: Secondary | ICD-10-CM | POA: Insufficient documentation

## 2020-02-15 ENCOUNTER — Other Ambulatory Visit: Payer: Self-pay

## 2020-02-15 ENCOUNTER — Ambulatory Visit (INDEPENDENT_AMBULATORY_CARE_PROVIDER_SITE_OTHER): Payer: Medicare Other | Admitting: Podiatry

## 2020-02-15 ENCOUNTER — Ambulatory Visit (INDEPENDENT_AMBULATORY_CARE_PROVIDER_SITE_OTHER): Payer: Medicare Other

## 2020-02-15 ENCOUNTER — Encounter: Payer: Self-pay | Admitting: Podiatry

## 2020-02-15 VITALS — Temp 98.1°F

## 2020-02-15 DIAGNOSIS — L97519 Non-pressure chronic ulcer of other part of right foot with unspecified severity: Secondary | ICD-10-CM

## 2020-02-15 DIAGNOSIS — L97512 Non-pressure chronic ulcer of other part of right foot with fat layer exposed: Secondary | ICD-10-CM | POA: Diagnosis not present

## 2020-02-15 DIAGNOSIS — E08621 Diabetes mellitus due to underlying condition with foot ulcer: Secondary | ICD-10-CM

## 2020-02-15 DIAGNOSIS — E1149 Type 2 diabetes mellitus with other diabetic neurological complication: Secondary | ICD-10-CM

## 2020-02-15 MED ORDER — DOXYCYCLINE HYCLATE 100 MG PO TABS: 100.0000 mg | ORAL_TABLET | Freq: Two times a day (BID) | ORAL | 0 refills | Status: DC

## 2020-02-16 NOTE — Progress Notes (Signed)
Subjective: 59 year old male presents the office today for concerns of a callus to his right foot that he states turned into a hole and has not been healing.  This started a few months ago but states he did not want to come to the office because of the pandemic.  He does note some bleeding at times.  Is been about the same size has not changed.  He is diabetic he is unsure of his last A1c.  Denies any claudication symptoms. Denies any systemic complaints such as fevers, chills, nausea, vomiting. No acute changes since last appointment, and no other complaints at this time.   Objective: AAO x3, NAD DP/PT pulses palpable bilaterally, CRT less than 3 seconds Protective sensation decreased with Simms Weinstein monofilament On the right foot submetatarsal 5 is an ulceration with hyperkeratotic periwound.  Prior to debridement the wound measures 0.4 x 0.4 x 0.2 cm after debridement measures 0.5 x 0.5 x 0.4 cm.  There is no probing to bone, undermining or tunneling.  There is no surrounding erythema, ascending cellulitis.  Is no fluctuation crepitation.  There is no malodor.  No open lesions or pre-ulcerative lesions.  No pain with calf compression, swelling, warmth, erythema  Assessment: Right foot submetatarsal 5 ulceration  Plan: -All treatment options discussed with the patient including all alternatives, risks, complications.  -X-rays obtained reviewed.  No evidence of acute fracture, osteomyelitis or soft tissue emphysema. -I sharply debrided the wound cellulitis #312 with scalpel down to healthy, bleeding, viable/granulation tissue with minimal blood loss.  This was down to level subcutaneous tissue.  There is no probing to bone. -Doxycycline prescribed -Mupirocin ointment daily -Surgical shoe with offloading dispensed. -Patient encouraged to call the office with any questions, concerns, change in symptoms.   Return in about 2 weeks (around 02/29/2020).  Trula Slade DPM

## 2020-02-22 ENCOUNTER — Ambulatory Visit (AMBULATORY_SURGERY_CENTER): Payer: Self-pay | Admitting: *Deleted

## 2020-02-22 ENCOUNTER — Other Ambulatory Visit: Payer: Self-pay

## 2020-02-22 VITALS — Temp 97.7°F | Ht 69.0 in | Wt 187.0 lb

## 2020-02-22 DIAGNOSIS — Z8719 Personal history of other diseases of the digestive system: Secondary | ICD-10-CM

## 2020-02-22 DIAGNOSIS — K7460 Unspecified cirrhosis of liver: Secondary | ICD-10-CM

## 2020-02-22 NOTE — Progress Notes (Signed)
Patient is here in-person for PV. Patient denies any allergies to eggs or soy. Patient denies any problems with anesthesia/sedation. Patient denies any oxygen use at home. Patient denies taking any diet/weight loss medications or blood thinners. Patient is not being treated for MRSA or C-diff. Patient is aware of our care-partner policy and [facility]W-23 safety protocol. Pt completed vaccines x2 April 2021 per pt.

## 2020-02-29 ENCOUNTER — Other Ambulatory Visit: Payer: Self-pay

## 2020-02-29 ENCOUNTER — Ambulatory Visit (INDEPENDENT_AMBULATORY_CARE_PROVIDER_SITE_OTHER): Payer: Medicare Other | Admitting: Podiatry

## 2020-02-29 DIAGNOSIS — L97512 Non-pressure chronic ulcer of other part of right foot with fat layer exposed: Secondary | ICD-10-CM | POA: Diagnosis not present

## 2020-02-29 DIAGNOSIS — E1149 Type 2 diabetes mellitus with other diabetic neurological complication: Secondary | ICD-10-CM

## 2020-02-29 DIAGNOSIS — E08621 Diabetes mellitus due to underlying condition with foot ulcer: Secondary | ICD-10-CM | POA: Diagnosis not present

## 2020-02-29 NOTE — Progress Notes (Signed)
Subjective: 59 year old male presents the office today for follow-up evaluation of a wound on the right foot submetatarsal 5.  He states that he had some bloody drainage originally but this is resolved.  No drainage or pus.  No swelling or redness of the foot.  He did not get the doxycycline.  He has been change the bandage with Iodosorb daily.  Remain in surgical shoe. Denies any systemic complaints such as fevers, chills, nausea, vomiting. No acute changes since last appointment, and no other complaints at this time.   Objective: AAO x3, NAD DP/PT pulses palpable bilaterally, CRT less than 3 seconds On the right foot submetatarsal 5 is a hyperkeratotic lesion with central wound present.  After debridement the wound today measures about the same at 0.5 x 0.5 x 0.3 cm and appears to be healthier with more granulation tissue.  There is no surrounding erythema, ascending cellulitis.  No fluctuation crepitation there is no malodor.  No swelling or warmth of the foot. No open lesions or pre-ulcerative lesions.  No pain with calf compression, swelling, warmth, erythema  Assessment: Right foot ulceration  Plan: -All treatment options discussed with the patient including all alternatives, risks, complications.  -Shelby debrided the wound today down to healthy, bleeding, granular tissue utilized #312 with scalpel.  Continue Iodosorb dressing changes daily I dispensed more form today.  Continue offloading dressing off the foot as much as possible.  Monitor closely for any signs or symptoms of infection call the office or go to the ER should any occur. -We will hold antibiotics is no signs of infection.  Was doing this more as a precaution last appointment.  Monitor closely. -Patient encouraged to call the office with any questions, concerns, change in symptoms.   Trula Slade DPM

## 2020-03-07 ENCOUNTER — Encounter: Payer: Self-pay | Admitting: Gastroenterology

## 2020-03-07 ENCOUNTER — Other Ambulatory Visit: Payer: Self-pay

## 2020-03-07 ENCOUNTER — Ambulatory Visit (AMBULATORY_SURGERY_CENTER): Payer: Medicare Other | Admitting: Gastroenterology

## 2020-03-07 VITALS — BP 109/62 | HR 82 | Temp 97.1°F | Resp 19 | Ht 69.0 in | Wt 187.0 lb

## 2020-03-07 DIAGNOSIS — K3189 Other diseases of stomach and duodenum: Secondary | ICD-10-CM

## 2020-03-07 DIAGNOSIS — Z8719 Personal history of other diseases of the digestive system: Secondary | ICD-10-CM | POA: Diagnosis not present

## 2020-03-07 DIAGNOSIS — K766 Portal hypertension: Secondary | ICD-10-CM | POA: Diagnosis not present

## 2020-03-07 DIAGNOSIS — K7460 Unspecified cirrhosis of liver: Secondary | ICD-10-CM | POA: Diagnosis present

## 2020-03-07 DIAGNOSIS — K449 Diaphragmatic hernia without obstruction or gangrene: Secondary | ICD-10-CM | POA: Diagnosis not present

## 2020-03-07 MED ORDER — SODIUM CHLORIDE 0.9 % IV SOLN: 500.0000 mL | Freq: Once | INTRAVENOUS | Status: DC

## 2020-03-07 NOTE — Patient Instructions (Signed)
You will need another EGD in 1-2 years.  Read your discharge instructions.  Thank-you for choosing [place] for your healthcare needs today.  YOU HAD AN ENDOSCOPIC PROCEDURE TODAY AT [facility]:   Refer to the procedure report that was given to you for any specific questions about what was found during the examination.  If the procedure report does not answer your questions, please call your gastroenterologist to clarify.  If you requested that your care partner not be given the details of your procedure findings, then the procedure report has been included in a sealed envelope for you to review at your convenience later.  YOU SHOULD EXPECT: Some feelings of bloating in the abdomen. Passage of more gas than usual.  Walking can help get rid of the air that was put into your GI tract during the procedure and reduce the bloating.   Please Note:  You might notice some irritation and congestion in your nose or some drainage.  This is from the oxygen used during your procedure.  There is no need for concern and it should clear up in a day or so.  SYMPTOMS TO REPORT IMMEDIATELY:    Following upper endoscopy (EGD)  Vomiting of blood or coffee ground material  New chest pain or pain under the shoulder blades  Painful or persistently difficult swallowing  New shortness of breath  Fever of 100F or higher  Black, tarry-looking stools  For urgent or emergent issues, a gastroenterologist can be reached at any hour by calling [phone]. Do not use MyChart messaging for urgent concerns.    DIET:  We do recommend a small meal at first, but then you may proceed to your regular diet.  Drink plenty of fluids but you should avoid alcoholic beverages for 24 hours.  ACTIVITY:  You should plan to take it easy for the rest of today and you should NOT DRIVE or use heavy machinery until tomorrow (because of the sedation medicines used during the test).    FOLLOW UP: Our staff will call the number  listed on your records 48-72 hours following your procedure to check on you and address any questions or concerns that you may have regarding the information given to you following your procedure. If we do not reach you, we will leave a message.  We will attempt to reach you two times.  During this call, we will ask if you have developed any symptoms of COVID 19. If you develop any symptoms (ie: fever, flu-like symptoms, shortness of breath, cough etc.) before then, please call [phone].  If you test positive for Covid 19 in the 2 weeks post procedure, please call and report this information to [place].    If any biopsies were taken you will be contacted by phone or by letter within the next 1-3 weeks.  Please call us at [phone] if you have not heard about the biopsies in 3 weeks.    SIGNATURES/CONFIDENTIALITY: You and/or your care partner have signed paperwork which will be entered into your electronic medical record.  These signatures attest to the fact that that the information above on your After Visit Summary has been reviewed and is understood.  Full responsibility of the confidentiality of this discharge information lies with you and/or your care-partner.

## 2020-03-07 NOTE — Progress Notes (Signed)
pt tolerated well. VSS. awake and to recovery. Report given to RN. Bite block placed awake with no complaint and removed with ease. Atraumatic.

## 2020-03-07 NOTE — Op Note (Addendum)
[name]: Brian [name] Procedure Date: 03/07/2020 9:39 AM MRN: [id number] Endoscopist: Remo Lipps P. Havery Moros , MD Age: 59 Referring MD:  Date of Birth: 1961/06/27 Gender: Male Account #: [id number] Procedure:                Upper GI endoscopy Indications:              Cirrhosis rule out esophageal varices, also with                            history of duodenal ulcer with bleeding in 2019 Medicines:                Monitored Anesthesia Care Procedure:                Pre-Anesthesia Assessment:                           - Prior to the procedure, a History and Physical                            was performed, and patient medications and                            allergies were reviewed. The patient's tolerance of                            previous anesthesia was also reviewed. The risks                            and benefits of the procedure and the sedation                            options and risks were discussed with the patient.                            All questions were answered, and informed consent                            was obtained. Prior Anticoagulants: The patient has                            taken no previous anticoagulant or antiplatelet                            agents. ASA Grade Assessment: III - A patient with                            severe systemic disease. After reviewing the risks                            and benefits, the patient was deemed in                            satisfactory condition to undergo the procedure.  After obtaining informed consent, the endoscope was                            passed under direct vision. Throughout the                            procedure, the patient's blood pressure, pulse, and                            oxygen saturations were monitored continuously. The                            Endoscope was introduced through the mouth, and                             advanced to the second part of duodenum. The upper                            GI endoscopy was accomplished without difficulty.                            The patient tolerated the procedure well. Scope In: Scope Out: Findings:                 Esophagogastric landmarks were identified: the                            Z-line was found at 40 cm, the gastroesophageal                            junction was found at 40 cm and the upper extent of                            the gastric folds was found at 42 cm from the                            incisors.                           A 2 cm hiatal hernia was present. Z line just                            slightly irregular but did not meet criteria for                            Barrett's                           Obe column of suspected trace varix was found in                            the lower third of the esophagus, easily flattened  with insufflation, no high risk stigmata noted. No                            significant varices otherwise                           The exam of the esophagus was otherwise normal.                           Portal hypertensive gastropathy was found in the                            gastric fundus and in the gastric body diffusely                            with hypertrophied gastric folds. No gastric                            varices noted.                           The exam of the stomach was otherwise normal.                           Biopsies were taken with a cold forceps in the                            gastric body and in the gastric antrum for                            Helicobacter pylori testing.                           The duodenal bulb and second portion of the                            duodenum were normal. Complications:            No immediate complications. Estimated blood loss:                            Minimal. Estimated Blood Loss:     Estimated blood loss was  minimal. Impression:               - Esophagogastric landmarks identified.                           - 2 cm hiatal hernia.                           - Slightly irregular z line which did not meet                            criteria for Barrett's                           -  One column of suspected trace esophageal varix.                           - Portal hypertensive gastropathy.                           - Normal duodenal bulb and second portion of the                            duodenum.                           - Biopsies were taken with a cold forceps for                            Helicobacter pylori testing. Recommendation:           - Patient has a contact number available for                            emergencies. The signs and symptoms of potential                            delayed complications were discussed with the                            patient. Return to normal activities tomorrow.                            Written discharge instructions were provided to the                            patient.                           - Resume previous diet.                           - Continue present medications.                           - Await pathology results.                           - Repeat EGD in 1-2 years for surveillance                           - Follow up in the office for routine care of                            cirrhosis Remo Lipps P. Anjel Pardo, MD 03/07/2020 10:13:59 AM This report has been signed electronically.

## 2020-03-07 NOTE — Progress Notes (Signed)
Called to room to assist during endoscopic procedure.  Patient ID and intended procedure confirmed with present staff. Received instructions for my participation in the procedure from the performing physician.  

## 2020-03-07 NOTE — Progress Notes (Signed)
Pt's states no medical or surgical changes since previsit or office visit. 

## 2020-03-09 ENCOUNTER — Telehealth: Payer: Self-pay | Admitting: *Deleted

## 2020-03-09 ENCOUNTER — Telehealth: Payer: Self-pay

## 2020-03-09 NOTE — Telephone Encounter (Signed)
Called [facility] at [facility] and left a message for doxycycline 100 mg #20 tablets and take bid. Lattie Haw

## 2020-03-09 NOTE — Telephone Encounter (Signed)
Sent over the prior authorization for the doxycycline to [facility] and the fax number is [phone]. Brian [name]

## 2020-03-09 NOTE — Telephone Encounter (Signed)
  Follow up Call-  Call back number 03/07/2020 02/19/2018  Post procedure Call Back phone  # [phone]  Permission to leave phone message Yes Yes  Some recent data might be hidden     Patient questions:  Do you have a fever, pain , or abdominal swelling? No. Pain Score  0 *  Have you tolerated food without any problems? Yes.    Have you been able to return to your normal activities? Yes.    Do you have any questions about your discharge instructions: Diet   No. Medications  No. Follow up visit  No.  Do you have questions or concerns about your Care? No.  Actions: * If pain score is 4 or above: No action needed, pain <4.  1. Have you developed a fever since your procedure? no  2.   Have you had an respiratory symptoms (SOB or cough) since your procedure? no  3.   Have you tested positive for COVID 19 since your procedure no  4.   Have you had any family members/close contacts diagnosed with the COVID 19 since your procedure?  no   If yes to any of these questions please route to Joylene John, RN and Erenest Rasher, RN

## 2020-03-16 ENCOUNTER — Ambulatory Visit (INDEPENDENT_AMBULATORY_CARE_PROVIDER_SITE_OTHER): Payer: Medicare Other | Admitting: Podiatry

## 2020-03-16 ENCOUNTER — Other Ambulatory Visit: Payer: Self-pay

## 2020-03-16 VITALS — Temp 97.1°F

## 2020-03-16 DIAGNOSIS — L97512 Non-pressure chronic ulcer of other part of right foot with fat layer exposed: Secondary | ICD-10-CM

## 2020-03-16 DIAGNOSIS — I739 Peripheral vascular disease, unspecified: Secondary | ICD-10-CM

## 2020-03-16 DIAGNOSIS — E08621 Diabetes mellitus due to underlying condition with foot ulcer: Secondary | ICD-10-CM | POA: Diagnosis not present

## 2020-03-16 DIAGNOSIS — E1149 Type 2 diabetes mellitus with other diabetic neurological complication: Secondary | ICD-10-CM | POA: Diagnosis not present

## 2020-03-16 NOTE — Progress Notes (Signed)
Subjective: 59 year old male presents the office today for follow-up evaluation of a wound on the right foot submetatarsal 5.  He has been keeping Iodosorb on the wound daily.  Denies any pain or swelling or any redness or drainage coming from the wound except for some amount of bloody drainage.  No pus. Denies any systemic complaints such as fevers, chills, nausea, vomiting. No acute changes since last appointment, and no other complaints at this time.   Objective: AAO x3, NAD DP/PT pulses palpable bilaterally, CRT less than 3 seconds On the right foot submetatarsal 5 is a hyperkeratotic lesion with central wound present.  After debridement the wound today measures about the same at 0.8 x 0.6 x 0.3 cm after debridement.  Prior to debridement the wound measures 0.5 x 0.3 x 0.2 cm.  Prior to debridement there was fibrotic, nonviable tissue and after debridement I debrided to healthy, bleeding, granular tissue.  There is no purulence.  There is no surrounding erythema, ascending cellulitis.  No fluctuation or rotation.  There is no malodor. No open lesions or pre-ulcerative lesions.  No pain with calf compression, swelling, warmth, erythema  Assessment: Right foot ulceration  Plan: -All treatment options discussed with the patient including all alternatives, risks, complications.  -Sharply debrided the wound today utilizing the 312 with scalpel to any complications none healthy, bleeding, viable tissue and debris and nonviable hyperkeratotic periwound, fibrotic tissue.  Minimal blood loss.  Order to switch to using Prisma dressing changes daily which I also dispensed today.  Continue offloading.  Also performed an ABI in the office which was normal.  Right was 1.07 Left was 1.14.  Return in about 2 weeks (around 03/30/2020).  Trula Slade DPM

## 2020-03-20 NOTE — Addendum Note (Signed)
Addended by: Teresita Madura on: 03/20/2020 08:49 AM   Modules accepted: Orders

## 2020-03-23 ENCOUNTER — Telehealth: Payer: Self-pay | Admitting: Podiatry

## 2020-03-23 NOTE — Telephone Encounter (Signed)
Pt called and is concerened when he has bandages on even loose that 2 toes are swelling and red pt did try to change the tape that the foot is wrapped to see if that would help and it did some would like more advise

## 2020-03-23 NOTE — Telephone Encounter (Signed)
Patient needs to be seen. Thanks Blanca Thornton

## 2020-03-23 NOTE — Telephone Encounter (Signed)
Called pt to schedule lvm to call to get scheduled

## 2020-03-24 ENCOUNTER — Other Ambulatory Visit: Payer: Self-pay

## 2020-03-24 ENCOUNTER — Ambulatory Visit (INDEPENDENT_AMBULATORY_CARE_PROVIDER_SITE_OTHER): Payer: 59

## 2020-03-24 ENCOUNTER — Ambulatory Visit (INDEPENDENT_AMBULATORY_CARE_PROVIDER_SITE_OTHER): Payer: Medicare Other | Admitting: Podiatry

## 2020-03-24 DIAGNOSIS — E1149 Type 2 diabetes mellitus with other diabetic neurological complication: Secondary | ICD-10-CM

## 2020-03-24 DIAGNOSIS — E08621 Diabetes mellitus due to underlying condition with foot ulcer: Secondary | ICD-10-CM | POA: Diagnosis not present

## 2020-03-24 DIAGNOSIS — I739 Peripheral vascular disease, unspecified: Secondary | ICD-10-CM | POA: Diagnosis not present

## 2020-03-24 DIAGNOSIS — L97512 Non-pressure chronic ulcer of other part of right foot with fat layer exposed: Secondary | ICD-10-CM

## 2020-03-24 MED ORDER — DOXYCYCLINE HYCLATE 100 MG PO TABS
100.0000 mg | ORAL_TABLET | Freq: Two times a day (BID) | ORAL | 0 refills | Status: DC
Start: 2020-03-24 — End: 2020-03-26

## 2020-03-26 ENCOUNTER — Inpatient Hospital Stay (HOSPITAL_COMMUNITY)
Admission: EM | Admit: 2020-03-26 | Discharge: 2020-04-02 | DRG: 239 | Disposition: A | Discharge status: Home Health | Payer: Medicare Other | Attending: Internal Medicine | Admitting: Internal Medicine

## 2020-03-26 ENCOUNTER — Encounter (HOSPITAL_COMMUNITY): Payer: Self-pay | Admitting: Internal Medicine

## 2020-03-26 ENCOUNTER — Inpatient Hospital Stay (HOSPITAL_COMMUNITY): Payer: Medicare Other

## 2020-03-26 ENCOUNTER — Emergency Department (HOSPITAL_COMMUNITY): Payer: Medicare Other

## 2020-03-26 ENCOUNTER — Telehealth: Payer: Self-pay | Admitting: Podiatry

## 2020-03-26 ENCOUNTER — Encounter: Payer: Self-pay | Admitting: Podiatry

## 2020-03-26 ENCOUNTER — Other Ambulatory Visit: Payer: Self-pay

## 2020-03-26 DIAGNOSIS — E11628 Type 2 diabetes mellitus with other skin complications: Secondary | ICD-10-CM

## 2020-03-26 DIAGNOSIS — D631 Anemia in chronic kidney disease: Secondary | ICD-10-CM | POA: Diagnosis present

## 2020-03-26 DIAGNOSIS — E871 Hypo-osmolality and hyponatremia: Secondary | ICD-10-CM | POA: Diagnosis present

## 2020-03-26 DIAGNOSIS — Z8673 Personal history of transient ischemic attack (TIA), and cerebral infarction without residual deficits: Secondary | ICD-10-CM | POA: Diagnosis not present

## 2020-03-26 DIAGNOSIS — L97519 Non-pressure chronic ulcer of other part of right foot with unspecified severity: Secondary | ICD-10-CM | POA: Diagnosis present

## 2020-03-26 DIAGNOSIS — E11621 Type 2 diabetes mellitus with foot ulcer: Secondary | ICD-10-CM | POA: Diagnosis present

## 2020-03-26 DIAGNOSIS — E1029 Type 1 diabetes mellitus with other diabetic kidney complication: Secondary | ICD-10-CM | POA: Diagnosis present

## 2020-03-26 DIAGNOSIS — L03031 Cellulitis of right toe: Secondary | ICD-10-CM | POA: Diagnosis present

## 2020-03-26 DIAGNOSIS — L98499 Non-pressure chronic ulcer of skin of other sites with unspecified severity: Secondary | ICD-10-CM | POA: Diagnosis present

## 2020-03-26 DIAGNOSIS — Z992 Dependence on renal dialysis: Secondary | ICD-10-CM

## 2020-03-26 DIAGNOSIS — N2581 Secondary hyperparathyroidism of renal origin: Secondary | ICD-10-CM | POA: Diagnosis present

## 2020-03-26 DIAGNOSIS — Z20822 Contact with and (suspected) exposure to covid-19: Secondary | ICD-10-CM | POA: Diagnosis present

## 2020-03-26 DIAGNOSIS — M109 Gout, unspecified: Secondary | ICD-10-CM | POA: Diagnosis present

## 2020-03-26 DIAGNOSIS — E1169 Type 2 diabetes mellitus with other specified complication: Secondary | ICD-10-CM | POA: Diagnosis present

## 2020-03-26 DIAGNOSIS — I120 Hypertensive chronic kidney disease with stage 5 chronic kidney disease or end stage renal disease: Secondary | ICD-10-CM | POA: Diagnosis present

## 2020-03-26 DIAGNOSIS — Z794 Long term (current) use of insulin: Secondary | ICD-10-CM

## 2020-03-26 DIAGNOSIS — Z09 Encounter for follow-up examination after completed treatment for conditions other than malignant neoplasm: Secondary | ICD-10-CM

## 2020-03-26 DIAGNOSIS — Z807 Family history of other malignant neoplasms of lymphoid, hematopoietic and related tissues: Secondary | ICD-10-CM | POA: Diagnosis not present

## 2020-03-26 DIAGNOSIS — L02611 Cutaneous abscess of right foot: Secondary | ICD-10-CM | POA: Diagnosis not present

## 2020-03-26 DIAGNOSIS — L089 Local infection of the skin and subcutaneous tissue, unspecified: Secondary | ICD-10-CM | POA: Diagnosis not present

## 2020-03-26 DIAGNOSIS — M869 Osteomyelitis, unspecified: Secondary | ICD-10-CM | POA: Diagnosis present

## 2020-03-26 DIAGNOSIS — E785 Hyperlipidemia, unspecified: Secondary | ICD-10-CM | POA: Diagnosis present

## 2020-03-26 DIAGNOSIS — F17210 Nicotine dependence, cigarettes, uncomplicated: Secondary | ICD-10-CM | POA: Diagnosis present

## 2020-03-26 DIAGNOSIS — I96 Gangrene, not elsewhere classified: Secondary | ICD-10-CM

## 2020-03-26 DIAGNOSIS — N186 End stage renal disease: Secondary | ICD-10-CM | POA: Diagnosis present

## 2020-03-26 DIAGNOSIS — E781 Pure hyperglyceridemia: Secondary | ICD-10-CM | POA: Diagnosis present

## 2020-03-26 DIAGNOSIS — M86671 Other chronic osteomyelitis, right ankle and foot: Secondary | ICD-10-CM

## 2020-03-26 DIAGNOSIS — E1122 Type 2 diabetes mellitus with diabetic chronic kidney disease: Secondary | ICD-10-CM | POA: Diagnosis present

## 2020-03-26 DIAGNOSIS — Z720 Tobacco use: Secondary | ICD-10-CM | POA: Diagnosis present

## 2020-03-26 DIAGNOSIS — Z8249 Family history of ischemic heart disease and other diseases of the circulatory system: Secondary | ICD-10-CM | POA: Diagnosis not present

## 2020-03-26 DIAGNOSIS — E1152 Type 2 diabetes mellitus with diabetic peripheral angiopathy with gangrene: Secondary | ICD-10-CM | POA: Diagnosis present

## 2020-03-26 DIAGNOSIS — Z886 Allergy status to analgesic agent status: Secondary | ICD-10-CM

## 2020-03-26 DIAGNOSIS — Z9081 Acquired absence of spleen: Secondary | ICD-10-CM

## 2020-03-26 DIAGNOSIS — Z880 Allergy status to penicillin: Secondary | ICD-10-CM

## 2020-03-26 DIAGNOSIS — K7469 Other cirrhosis of liver: Secondary | ICD-10-CM | POA: Diagnosis present

## 2020-03-26 DIAGNOSIS — L0390 Cellulitis, unspecified: Secondary | ICD-10-CM | POA: Diagnosis not present

## 2020-03-26 DIAGNOSIS — L03115 Cellulitis of right lower limb: Secondary | ICD-10-CM | POA: Diagnosis present

## 2020-03-26 DIAGNOSIS — I10 Essential (primary) hypertension: Secondary | ICD-10-CM | POA: Diagnosis present

## 2020-03-26 DIAGNOSIS — Z8639 Personal history of other endocrine, nutritional and metabolic disease: Secondary | ICD-10-CM

## 2020-03-26 DIAGNOSIS — Z833 Family history of diabetes mellitus: Secondary | ICD-10-CM

## 2020-03-26 HISTORY — DX: End stage renal disease: N18.6

## 2020-03-26 LAB — PREALBUMIN: Prealbumin: 6.8 mg/dL — ABNORMAL LOW (ref 18–38)

## 2020-03-26 LAB — BASIC METABOLIC PANEL
Anion gap: 15 (ref 5–15)
BUN: 42 mg/dL — ABNORMAL HIGH (ref 6–20)
CO2: 23 mmol/L (ref 22–32)
Calcium: 8.9 mg/dL (ref 8.9–10.3)
Chloride: 87 mmol/L — ABNORMAL LOW (ref 98–111)
Creatinine, Ser: 6.97 mg/dL — ABNORMAL HIGH (ref 0.61–1.24)
GFR calc Af Amer: 9 mL/min — ABNORMAL LOW (ref >60)
GFR calc non Af Amer: 8 mL/min — ABNORMAL LOW (ref >60)
Glucose, Bld: 145 mg/dL — ABNORMAL HIGH (ref 70–99)
Potassium: 3.8 mmol/L (ref 3.5–5.1)
Sodium: 125 mmol/L — ABNORMAL LOW (ref 135–145)

## 2020-03-26 LAB — CBC
HCT: 39.5 % (ref 39.0–52.0)
Hemoglobin: 13.1 g/dL (ref 13.0–17.0)
MCH: 34.1 pg — ABNORMAL HIGH (ref 26.0–34.0)
MCHC: 33.2 g/dL (ref 30.0–36.0)
MCV: 102.9 fL — ABNORMAL HIGH (ref 80.0–100.0)
Platelets: 333 10*3/uL (ref 150–400)
RBC: 3.84 MIL/uL — ABNORMAL LOW (ref 4.22–5.81)
RDW: 12.3 % (ref 11.5–15.5)
WBC: 20 10*3/uL — ABNORMAL HIGH (ref 4.0–10.5)
nRBC: 0 % (ref 0.0–0.2)

## 2020-03-26 LAB — HEPATIC FUNCTION PANEL
ALT: 22 U/L (ref 0–44)
AST: 37 U/L (ref 15–41)
Albumin: 3 g/dL — ABNORMAL LOW (ref 3.5–5.0)
Alkaline Phosphatase: 129 U/L — ABNORMAL HIGH (ref 38–126)
Bilirubin, Direct: 0.6 mg/dL — ABNORMAL HIGH (ref 0.0–0.2)
Indirect Bilirubin: 1.4 mg/dL — ABNORMAL HIGH (ref 0.3–0.9)
Total Bilirubin: 2 mg/dL — ABNORMAL HIGH (ref 0.3–1.2)
Total Protein: 6.8 g/dL (ref 6.5–8.1)

## 2020-03-26 LAB — CBG MONITORING, ED: Glucose-Capillary: 114 mg/dL — ABNORMAL HIGH (ref 70–99)

## 2020-03-26 LAB — C-REACTIVE PROTEIN: CRP: 12.4 mg/dL — ABNORMAL HIGH (ref <1.0)

## 2020-03-26 LAB — HEMOGLOBIN A1C
Hgb A1c MFr Bld: 6.5 % — ABNORMAL HIGH (ref 4.8–5.6)
Mean Plasma Glucose: 139.85 mg/dL

## 2020-03-26 LAB — LACTIC ACID, PLASMA: Lactic Acid, Venous: 1.4 mmol/L (ref 0.5–1.9)

## 2020-03-26 LAB — SARS CORONAVIRUS 2 BY RT PCR (HOSPITAL ORDER, PERFORMED IN ~~LOC~~ HOSPITAL LAB): SARS Coronavirus 2: NEGATIVE

## 2020-03-26 LAB — HIV ANTIBODY (ROUTINE TESTING W REFLEX): HIV Screen 4th Generation wRfx: NONREACTIVE

## 2020-03-26 LAB — PROTIME-INR
INR: 1 (ref 0.8–1.2)
Prothrombin Time: 13.1 seconds (ref 11.4–15.2)

## 2020-03-26 LAB — SURGICAL PCR SCREEN
MRSA, PCR: NEGATIVE
Staphylococcus aureus: NEGATIVE

## 2020-03-26 LAB — SEDIMENTATION RATE: Sed Rate: 40 mm/hr — ABNORMAL HIGH (ref 0–16)

## 2020-03-26 LAB — GLUCOSE, CAPILLARY
Glucose-Capillary: 136 mg/dL — ABNORMAL HIGH (ref 70–99)
Glucose-Capillary: 214 mg/dL — ABNORMAL HIGH (ref 70–99)

## 2020-03-26 LAB — APTT: aPTT: 35 seconds (ref 24–36)

## 2020-03-26 MED ORDER — SODIUM CHLORIDE 0.9 % IV SOLN: 2.0000 g | INTRAVENOUS | Status: DC

## 2020-03-26 MED ORDER — ACETAMINOPHEN 650 MG RE SUPP: 650.0000 mg | Freq: Four times a day (QID) | RECTAL | Status: DC | PRN

## 2020-03-26 MED ORDER — PANTOPRAZOLE SODIUM 20 MG PO TBEC
20.0000 mg | DELAYED_RELEASE_TABLET | Freq: Every day | ORAL | Status: DC
  Administered 2020-03-26 – 2020-04-02 (×8): 20 mg via ORAL
  Filled 2020-03-26 (×9): qty 1

## 2020-03-26 MED ORDER — METRONIDAZOLE IN NACL 5-0.79 MG/ML-% IV SOLN
500.0000 mg | Freq: Three times a day (TID) | INTRAVENOUS | Status: DC
  Administered 2020-03-26 – 2020-03-31 (×14): 500 mg via INTRAVENOUS
  Filled 2020-03-26 (×14): qty 100

## 2020-03-26 MED ORDER — FERRIC CITRATE 1 GM 210 MG(FE) PO TABS: 210.0000 mg | ORAL_TABLET | Freq: Three times a day (TID) | ORAL | Status: DC

## 2020-03-26 MED ORDER — ONDANSETRON HCL 4 MG/2ML IJ SOLN
4.0000 mg | Freq: Four times a day (QID) | INTRAMUSCULAR | Status: DC | PRN
  Administered 2020-04-02: 4 mg via INTRAVENOUS
  Filled 2020-03-26: qty 2

## 2020-03-26 MED ORDER — VANCOMYCIN HCL 1750 MG/350ML IV SOLN
1750.0000 mg | Freq: Once | INTRAVENOUS | Status: AC
  Administered 2020-03-26: 1750 mg via INTRAVENOUS
  Filled 2020-03-26: qty 350

## 2020-03-26 MED ORDER — VANCOMYCIN HCL IN DEXTROSE 1-5 GM/200ML-% IV SOLN: 1000.0000 mg | Freq: Once | INTRAVENOUS | Status: DC

## 2020-03-26 MED ORDER — AMLODIPINE BESYLATE 5 MG PO TABS
5.0000 mg | ORAL_TABLET | Freq: Every day | ORAL | Status: DC
  Administered 2020-03-26 – 2020-03-28 (×3): 5 mg via ORAL
  Filled 2020-03-26 (×4): qty 1

## 2020-03-26 MED ORDER — CHLORHEXIDINE GLUCONATE CLOTH 2 % EX PADS
6.0000 | MEDICATED_PAD | Freq: Once | CUTANEOUS | Status: AC
  Administered 2020-03-26: 6 via TOPICAL

## 2020-03-26 MED ORDER — CHLORHEXIDINE GLUCONATE CLOTH 2 % EX PADS
6.0000 | MEDICATED_PAD | Freq: Every day | CUTANEOUS | Status: DC
  Administered 2020-03-27 – 2020-03-28 (×2): 6 via TOPICAL

## 2020-03-26 MED ORDER — SODIUM CHLORIDE 0.9 % IV SOLN
1.0000 g | INTRAVENOUS | Status: DC
  Filled 2020-03-26: qty 1

## 2020-03-26 MED ORDER — SODIUM CHLORIDE 0.9 % IV SOLN
2.0000 g | INTRAVENOUS | Status: DC
  Administered 2020-03-26 – 2020-04-02 (×8): 2 g via INTRAVENOUS
  Filled 2020-03-26 (×8): qty 20

## 2020-03-26 MED ORDER — SODIUM CHLORIDE 0.9 % IV SOLN: 2.0000 g | Freq: Once | INTRAVENOUS | Status: DC

## 2020-03-26 MED ORDER — VANCOMYCIN HCL IN DEXTROSE 1-5 GM/200ML-% IV SOLN
1000.0000 mg | INTRAVENOUS | Status: DC
  Administered 2020-03-29 – 2020-03-31 (×2): 1000 mg via INTRAVENOUS
  Filled 2020-03-26 (×3): qty 200

## 2020-03-26 MED ORDER — FERRIC CITRATE 1 GM 210 MG(FE) PO TABS
420.0000 mg | ORAL_TABLET | Freq: Three times a day (TID) | ORAL | Status: DC
  Administered 2020-03-26 – 2020-04-02 (×18): 420 mg via ORAL
  Filled 2020-03-26 (×20): qty 2

## 2020-03-26 MED ORDER — NICOTINE 14 MG/24HR TD PT24
14.0000 mg | MEDICATED_PATCH | Freq: Every day | TRANSDERMAL | Status: DC
  Filled 2020-03-26 (×3): qty 1

## 2020-03-26 MED ORDER — INSULIN ASPART 100 UNIT/ML ~~LOC~~ SOLN
0.0000 [IU] | Freq: Three times a day (TID) | SUBCUTANEOUS | Status: DC
  Administered 2020-03-26 – 2020-03-27 (×2): 2 [IU] via SUBCUTANEOUS
  Administered 2020-03-28 (×3): 1 [IU] via SUBCUTANEOUS
  Administered 2020-03-30: 4 [IU] via SUBCUTANEOUS
  Administered 2020-03-31 (×2): 2 [IU] via SUBCUTANEOUS
  Administered 2020-04-01 – 2020-04-02 (×3): 1 [IU] via SUBCUTANEOUS

## 2020-03-26 MED ORDER — HEPARIN SODIUM (PORCINE) 5000 UNIT/ML IJ SOLN
5000.0000 [IU] | Freq: Three times a day (TID) | INTRAMUSCULAR | Status: DC
  Administered 2020-03-26 – 2020-04-02 (×15): 5000 [IU] via SUBCUTANEOUS
  Filled 2020-03-26 (×16): qty 1

## 2020-03-26 MED ORDER — ONDANSETRON HCL 4 MG PO TABS: 4.0000 mg | ORAL_TABLET | Freq: Four times a day (QID) | ORAL | Status: DC | PRN

## 2020-03-26 MED ORDER — DOCUSATE SODIUM 100 MG PO CAPS
100.0000 mg | ORAL_CAPSULE | Freq: Two times a day (BID) | ORAL | Status: DC
  Filled 2020-03-26 (×8): qty 1

## 2020-03-26 MED ORDER — ACETAMINOPHEN 325 MG PO TABS: 650.0000 mg | ORAL_TABLET | Freq: Four times a day (QID) | ORAL | Status: DC | PRN

## 2020-03-26 MED ORDER — SODIUM CHLORIDE 0.9 % IV BOLUS: 1000.0000 mL | Freq: Once | INTRAVENOUS | Status: DC

## 2020-03-26 NOTE — Progress Notes (Signed)
Pharmacy Antibiotic Note  Brian [name] is a 59 y.o. male admitted on 03/26/2020 with sepsis likely secondary to right toe infection.  Pharmacy has been consulted for vancomycin and cefepime dosing. He is followed by the wound clinic and has been taking doxycycline.   Patient is ESRD on HD.   Plan: Vancomycin 1750mg  x1 Follow up HD schedule for redosing  Cefepime 1g q24h      Temp (24hrs), Avg:98.7 F (37.1 C), Min:98.7 F (37.1 C), Max:98.7 F (37.1 C)  Recent Labs  Lab 03/26/20 0105  WBC 20.0*  CREATININE 6.97*    CrCl cannot be calculated (Unknown ideal weight.).    Allergies  Allergen Reactions  . Penicillins Hives and Rash    Has patient had a PCN reaction causing immediate rash, facial/tongue/throat swelling, SOB or lightheadedness with hypotension: Yes Has patient had a PCN reaction causing severe rash involving mucus membranes or skin necrosis: No Has patient had a PCN reaction that required hospitalization: No Has patient had a PCN reaction occurring within the last 10 years: No If all of the above answers are "NO", then may proceed with Cephalosporin use.   . Nsaids     Due to kidneys  . Aspirin Nausea And Vomiting    Other Reaction: GI Upset    Antimicrobials this admission: 6/20 Vancomycin >>  6/20 cefepime >>   Microbiology results: 6/20 BCx: 6/20 UCx:   Thank you for allowing pharmacy to be a part of this patient's care.  Phillis Haggis 03/26/2020 8:53 AM

## 2020-03-26 NOTE — Consult Note (Signed)
Reason for Consult: Dr. [name], MD Referring Physician: Osteomyelitis   Brian [name] is an 59 y.o. male.  HPI: 59 year old male was admitted to the hospital for osteomyelitis, cellulitis to his right foot.  I recently saw the patient on May 11 for a wound.The wound appears to be stable I did prescribe doxycycline originally at the wound and present for a few months at that point already.On my last examination he was doing well but we did switch him to [facility] dressing changes.  He apparently came to the office on Friday to see Dr. Posey Pronto for worsening of the wound as well as redness.  He is started on doxycycline.  She is on Saturday change the bandage and the foot was more red and swollen and he came to the emergency department this morning.  X-rays were performed which were concerning for osteomyelitis.  He was started on broad-spectrum antibiotics.  He states that he is currently feeling better but he was having low-grade fevers yesterday.  He has no pain to the foot currently.  He states that he knows that he needs amputation of the fifth toe.  Previous ABI was done in the office in the right was 1.07 the left was 1.14.  Past Medical History:  Diagnosis Date  . Arthritis   . Bleeding ulcer   . Complication of anesthesia    " I had a nose bleed a day or two after surgery-it could have been the oxygen"  . Diabetes mellitus without complication ([place])   . Diabetic neuropathy ([place])   . Elevated TSH   . ESRD on dialysis [facility])    MWF  . History of kidney stones   . Hypertension   . Stroke [facility])    hemorrhagic 2010  . Tobacco abuse     Past Surgical History:  Procedure Laterality Date  . A/V FISTULAGRAM N/A 02/05/2018   Procedure: A/V FISTULAGRAM;  Surgeon: Conrad Stow, MD;  Location: [facility];  Service: Cardiovascular;  Laterality: N/A;  . AV FISTULA PLACEMENT Left 05/16/2017   Procedure: ARTERIOVENOUS (AV) FISTULA CREATION-LEFT;  Surgeon: Angelia Mould, MD;   Location: [facility];  Service: Vascular;  Laterality: Left;  . CHOLECYSTECTOMY    . COLONOSCOPY    . ESOPHAGOGASTRODUODENOSCOPY N/A 01/23/2018   Procedure: ESOPHAGOGASTRODUODENOSCOPY (EGD);  Surgeon: Yetta Flock, MD;  Location: [facility] ENDOSCOPY;  Service: Gastroenterology;  Laterality: N/A;  . PANCREAS SURGERY    . PERIPHERAL VASCULAR BALLOON ANGIOPLASTY  02/05/2018   Procedure: PERIPHERAL VASCULAR BALLOON ANGIOPLASTY;  Surgeon: Conrad , MD;  Location: [facility];  Service: Cardiovascular;;  left AV fistula  . SPLENECTOMY      Family History  Problem Relation Age of Onset  . Lymphoma Mother   . Hypertension Father   . Diabetes Mellitus II Father   . Hypertension Brother   . Diabetes Mellitus I Brother   . Hypertension Brother   . Colon cancer Neg Hx   . Stomach cancer Neg Hx   . Colon polyps Neg Hx   . Esophageal cancer Neg Hx   . Rectal cancer Neg Hx     Social History:  reports that he has been smoking cigarettes. He has a 20.00 pack-year smoking history. He has never used smokeless tobacco. He reports that he does not drink alcohol and does not use drugs.  Allergies:  Allergies  Allergen Reactions  . Penicillins Hives and Rash    Has patient had a PCN reaction causing  immediate rash, facial/tongue/throat swelling, SOB or lightheadedness with hypotension: Yes Has patient had a PCN reaction causing severe rash involving mucus membranes or skin necrosis: No Has patient had a PCN reaction that required hospitalization: No Has patient had a PCN reaction occurring within the last 10 years: No If all of the above answers are "NO", then may proceed with Cephalosporin use.   . Nsaids     Due to kidneys  . Aspirin Nausea And Vomiting    Other Reaction: GI Upset    Medications: I have reviewed the patient's current medications.  Results for orders placed or performed during the hospital encounter of 03/26/20 (from the past 48 hour(s))  CBC     Status: Abnormal    Collection Time: 03/26/20  1:05 AM  Result Value Ref Range   WBC 20.0 (H) 4.0 - 10.5 K/uL   RBC 3.84 (L) 4.22 - 5.81 MIL/uL   Hemoglobin 13.1 13.0 - 17.0 g/dL   HCT 39.5 39 - 52 %   MCV 102.9 (H) 80.0 - 100.0 fL   MCH 34.1 (H) 26.0 - 34.0 pg   MCHC 33.2 30.0 - 36.0 g/dL   RDW 12.3 11.5 - 15.5 %   Platelets 333 150 - 400 K/uL   nRBC 0.0 0.0 - 0.2 %    Comment: Performed at [facility], [place] [address], [place], [place] [address]  Basic metabolic panel     Status: Abnormal   Collection Time: 03/26/20  1:05 AM  Result Value Ref Range   Sodium 125 (L) 135 - 145 mmol/L   Potassium 3.8 3.5 - 5.1 mmol/L   Chloride 87 (L) 98 - 111 mmol/L   CO2 23 22 - 32 mmol/L   Glucose, Bld 145 (H) 70 - 99 mg/dL    Comment: Glucose reference range applies only to samples taken after fasting for at least 8 hours.   BUN 42 (H) 6 - 20 mg/dL   Creatinine, Ser 6.97 (H) 0.61 - 1.24 mg/dL   Calcium 8.9 8.9 - 10.3 mg/dL   GFR calc non Af Amer 8 (L) >60 mL/min   GFR calc Af Amer 9 (L) >60 mL/min   Anion gap 15 5 - 15    Comment: Performed at [facility] [address], [place], [place] [address]  Lactic acid, plasma     Status: None   Collection Time: 03/26/20  8:48 AM  Result Value Ref Range   Lactic Acid, Venous 1.4 0.5 - 1.9 mmol/L    Comment: Performed at [facility] [address] [place], [place] [address]  APTT     Status: None   Collection Time: 03/26/20  9:05 AM  Result Value Ref Range   aPTT 35 24 - 36 seconds    Comment: Performed at [facility] [address], [place], [place] [address]  Protime-INR     Status: None   Collection Time: 03/26/20  9:05 AM  Result Value Ref Range   Prothrombin Time 13.1 11.4 - 15.2 seconds   INR 1.0 0.8 - 1.2    Comment: (NOTE) INR goal varies based on device and disease states. Performed at [facility], [place] [address], [place], [place] [address]   Hepatic function panel     Status: Abnormal   Collection Time: 03/26/20   9:05 AM  Result Value Ref Range   Total Protein 6.8 6.5 - 8.1 g/dL   Albumin 3.0 (L) 3.5 - 5.0  g/dL   AST 37 15 - 41 U/L   ALT 22 0 - 44 U/L   Alkaline Phosphatase 129 (H) 38 - 126 U/L   Total Bilirubin 2.0 (H) 0.3 - 1.2 mg/dL   Bilirubin, Direct 0.6 (H) 0.0 - 0.2 mg/dL   Indirect Bilirubin 1.4 (H) 0.3 - 0.9 mg/dL    Comment: Performed at [facility] [address], [place], [place] [address]  Hemoglobin A1c     Status: Abnormal   Collection Time: 03/26/20 11:38 AM  Result Value Ref Range   Hgb A1c MFr Bld 6.5 (H) 4.8 - 5.6 %    Comment: (NOTE) Pre diabetes:          5.7%-6.4%  Diabetes:              >6.4%  Glycemic control for   <7.0% adults with diabetes    Mean Plasma Glucose 139.85 mg/dL    Comment: Performed at [facility] [address], [place], [place] [address]  Sedimentation rate     Status: Abnormal   Collection Time: 03/26/20 11:38 AM  Result Value Ref Range   Sed Rate 40 (H) 0 - 16 mm/hr    Comment: Performed at [facility] [address], [place], [place] [address]  C-reactive protein     Status: Abnormal   Collection Time: 03/26/20 11:38 AM  Result Value Ref Range   CRP 12.4 (H) <1.0 mg/dL    Comment: Performed at [facility] [address] [place], [place] [address]  Prealbumin     Status: Abnormal   Collection Time: 03/26/20 11:38 AM  Result Value Ref Range   Prealbumin 6.8 (L) 18 - 38 mg/dL    Comment: Performed at [facility] [address], [place], [place] [address]  HIV Antibody (routine testing w rflx)     Status: None   Collection Time: 03/26/20 11:38 AM  Result Value Ref Range   HIV Screen 4th Generation wRfx Non Reactive Non Reactive    Comment: Performed at [facility], [place] [address], [place], [place] [address]  SARS Coronavirus 2 by RT PCR (hospital order, performed in [facility] hospital lab) Nasopharyngeal Nasopharyngeal Swab     Status: None   Collection Time: 03/26/20 11:38 AM   Specimen:  Nasopharyngeal Swab  Result Value Ref Range   SARS Coronavirus 2 NEGATIVE NEGATIVE    Comment: (NOTE) SARS-CoV-2 target nucleic acids are NOT DETECTED.  The SARS-CoV-2 RNA is generally detectable in upper and lower respiratory specimens during the acute phase of infection. The lowest concentration of SARS-CoV-2 viral copies this assay can detect is 250 copies / mL. A negative result does not preclude SARS-CoV-2 infection and should not be used as the sole basis for treatment or other patient management decisions.  A negative result may occur with improper specimen collection / handling, submission of specimen other than nasopharyngeal swab, presence of viral mutation(s) within the areas targeted by this assay, and inadequate number of viral copies (<250 copies / mL). A negative result must be combined with clinical observations, patient history, and epidemiological information.  Fact Sheet for Patients:   StrictlyIdeas.no  Fact Sheet for Healthcare Providers: BankingDealers.co.za  This test is not yet approved or  cleared by the [place] FDA and has been authorized for detection and/or diagnosis of SARS-CoV-2 by FDA under an Emergency Use Authorization (EUA).  This EUA will remain in effect (meaning this test can  be used) for the duration of the COVID-19 declaration under Section 564(b)(1) of the Act, 21 U.S.C. section 360bbb-3(b)(1), unless the authorization is terminated or revoked sooner.  Performed at [facility], [place] [address], [place], [place] [address]   CBG monitoring, ED     Status: Abnormal   Collection Time: 03/26/20  1:20 PM  Result Value Ref Range   Glucose-Capillary 114 (H) 70 - 99 mg/dL    Comment: Glucose reference range applies only to samples taken after fasting for at least 8 hours.    DG Ankle Complete Right  Result Date: 03/26/2020 CLINICAL DATA:  Diabetes with neuropathy. Evaluate for  osteomyelitis. EXAM: RIGHT ANKLE - COMPLETE 3+ VIEW COMPARISON:  None. FINDINGS: Ankle mortise is normal. Mild diffuse decreased bone mineralization. Moderate degenerative changes over the midfoot and hindfoot as well as the tibiotalar joint. No evidence of bone destruction to suggest osteomyelitis. Spurring over the posterior and inferior calcaneus. IMPRESSION: No acute findings. Electronically Signed   By: Marin Olp M.D.   On: 03/26/2020 10:49   DG Foot Complete Right  Result Date: 03/26/2020 CLINICAL DATA:  History of diabetes and neuropathy. Open wound between fourth and fifth toes 1 week. Evaluate for osteomyelitis. EXAM: RIGHT FOOT COMPLETE - 3+ VIEW COMPARISON:  None. FINDINGS: Soft tissue wound over the plantar surface of the forefoot adjacent the fifth MTP joint. No significant air in the soft tissues. Slight lucency over the head of the fifth metatarsal on the AP view without discrete bone destruction. No other areas of bone destruction to suggest osteomyelitis. There is moderate degenerative change over the first MTP joint and midfoot/hindfoot. Spurring over the posterior and inferior calcaneus. IMPRESSION: 1. Soft tissue wound over the plantar aspect of the forefoot adjacent the fifth MTP joint. Subtle lucency over the head of the fifth metatarsal mildly suspicious for osteomyelitis. No discrete bone destruction. 2.  Moderate degenerative changes as described Electronically Signed   By: Marin Olp M.D.   On: 03/26/2020 10:41   DG Toe 5th Right  Result Date: 03/26/2020 CLINICAL DATA:  Toe infection EXAM: RIGHT FIFTH TOE COMPARISON:  None. FINDINGS: There is question of periosteal reaction seen with cortical step-off at the base of the distal fifth phalanx. Diffuse overlying soft tissue swelling with subcutaneous emphysema is seen. IMPRESSION: Cortical irregularity and periosteal reaction of the fifth distal phalanx which could be due to osteomyelitis. Overlying soft tissue swelling and  subcutaneous emphysema. Electronically Signed   By: Prudencio Pair M.D.   On: 03/26/2020 01:38    Review of Systems Blood pressure 125/67, pulse 82, temperature 98.7 F (37.1 C), temperature source Oral, resp. rate (!) 23, SpO2 96 %. Physical Exam General: AAO x3, NAD  Dermatological: Clinical pictures reviewed as well as in person.  There is full-thickness wound submetatarsal 5 as well as to the lateral fifth digit with necrotic tissue present.  There is malodor present.  There is what appears to be a flat blister, concern for abscess on the sulcus submetatarsal 5/4.  Erythema to the distal leg and foot.  Warm to the foot.  There is no areas of fluctuation identified today or crepitation.  Vascular: Dorsalis Pedis artery and Posterior Tibial artery pedal pulses are palpable bilateral with immedate capillary fill time except for the fifth toe on the right foot which is delayed.  Neruologic: Sensation absent  Musculoskeletal: No pain  Assessment/Plan: -I did not review the chart as well as labs and x-ray findings.  Given concern for  the infection I have ordered MRI however given the extent of the infection I do recommend partial fifth ray amputation.  I discussed the surgery as well as the postoperative course including, but not limited to, spread of infection, need for further dictation as well as general risks of surgery including stroke, heart attack, death.  We will plan on doing surgery tomorrow he is scheduled for 12:15 PM for right partial fifth ray amputation.  We will hopefully get an MRI overnight which is ordered today.  N.p.o. after midnight. -Continue broad-spectrum antibiotics for now.  Will obtain cultures intraoperatively tomorrow. -Arterial studies pending.  ABI was performed in the office which is normal but will obtain formal studies. -Elevation -I discussed the findings with the patient in detail he has no further questions or concerns.  I also contacted the patient's sister at  his request discussed the plan. -Discussed with him likely will do amputation tomorrow and will return to the operating on Wednesday or Thursday for hopeful debridement as well as wound closure.  Celesta Gentile, DPM O: [phone] C: [phone]  Trula Slade 03/26/2020, 5:08 PM

## 2020-03-26 NOTE — Consult Note (Addendum)
[facility] Nurse Consult Note: Patient seen by Podiatry on 03/24/20.  Podiatry will see while in house.  Silver collagen (PRomogran/Prisma) was ordered on Friday, this is a non formulary product at [facility].  I will provide orders for silver hydrofiber dressing (Aquacel Advantage) while here and patient can resume silver collagen post discharge.  See Dr. Leigh Aurora note for measurements post debridement on Friday.  [facility] nursing team will not follow, but will remain available to this patient, the nursing and medical teams.  Please re-consult if needed. Thanks, Maudie Flakes, MSN, RN, [place], [facility]  Pager# [phone]

## 2020-03-26 NOTE — ED Provider Notes (Signed)
[place] EMERGENCY DEPARTMENT Provider Note   CSN: [id number] Arrival date & time: 03/26/20  0040     History Chief Complaint  Patient presents with  . Wound Infection    Brian [name] is a 59 y.o. male.  HPI      59 year old male with history of diabetes, hypertension, CVA, hyperlipidemia, splenectomy, ESRD on dialysis who has been [facility] with [facility] with concern for right diabetic foot wound, recently started on doxycycline on Friday, presents with concern for worsening erythema.  He has had chronic wound to the right fifth metatarsal, and had some redness and drainage beginning last Wednesday. Reports some subjective fevers at that time. He was seen in the office by Dr. Jacqualyn Posey on Friday, where he had some redness and drainage, and was started on doxycycline. He is continue take the antibiotic as prescribed, but has had continuing and worsening redness of the foot. Reports extension from the toe to the dorsum of the foot, as well as a small area over his lower leg. Reports due to his neuropathy he does not have pain. He has not been able to check his temperature at home, but feels he may have waxing and waning subjective fevers. Denies nausea vomiting, diarrhea, shortness of breath or other concerns.  Glucose has been well controlled. Past Medical History:  Diagnosis Date  . Arthritis   . Bleeding ulcer   . Complication of anesthesia    " I had a nose bleed a day or two after surgery-it could have been the oxygen"  . Diabetes mellitus without complication ([place])   . Diabetic neuropathy ([place])   . Elevated TSH   . ESRD on dialysis [facility])    MWF  . History of kidney stones   . Hypertension   . Stroke [facility])    hemorrhagic 2010  . Tobacco abuse     Patient Active Problem List   Diagnosis Date Noted  . Diabetic infection of right foot ([place]) 03/26/2020  . Gastritis 01/23/2018  . Acute blood loss anemia 01/23/2018  . Duodenal ulcer   .  Upper GI bleed 01/22/2018  . Type 1 diabetes mellitus with kidney complication ([place]) - on insulin 05/01/2017  . Hemorrhagic stroke [facility])- Jan 2010 05/01/2017  . H/O hyperlipidemia- sev yrs ago 05/01/2017  . h/o Hypertriglyceridemia- 13000's in past 05/01/2017  . H/O splenectomy 05/01/2017  . Status post cholecystectomy 05/01/2017  . Gout- L foot- mult times 05/01/2017  . Diabetic neuropathy ([place])- 6 mo or so; never started meds 05/01/2017  . Vitamin D deficiency 05/01/2017  . ESRD on dialysis ([place]) 02/05/2017  . Anasarca- may 2018 hosp 02/05/2017  . Leukocytosis- s/p plenectomy 02/05/2017  . Hypertension   . Diabetes mellitus without complication ([place])   . Tobacco abuse- >er than 55 pack yr hx; current smoker     Past Surgical History:  Procedure Laterality Date  . A/V FISTULAGRAM N/A 02/05/2018   Procedure: A/V FISTULAGRAM;  Surgeon: Conrad Delbarton, MD;  Location: [place] CV LAB;  Service: Cardiovascular;  Laterality: N/A;  . AV FISTULA PLACEMENT Left 05/16/2017   Procedure: ARTERIOVENOUS (AV) FISTULA CREATION-LEFT;  Surgeon: Angelia Mould, MD;  Location: [place];  Service: Vascular;  Laterality: Left;  . CHOLECYSTECTOMY    . COLONOSCOPY    . ESOPHAGOGASTRODUODENOSCOPY N/A 01/23/2018   Procedure: ESOPHAGOGASTRODUODENOSCOPY (EGD);  Surgeon: Yetta Flock, MD;  Location: [facility] ENDOSCOPY;  Service: Gastroenterology;  Laterality: N/A;  . PANCREAS SURGERY    .  PERIPHERAL VASCULAR BALLOON ANGIOPLASTY  02/05/2018   Procedure: PERIPHERAL VASCULAR BALLOON ANGIOPLASTY;  Surgeon: Conrad , MD;  Location: [place] CV LAB;  Service: Cardiovascular;;  left AV fistula  . SPLENECTOMY         Family History  Problem Relation Age of Onset  . Lymphoma Mother   . Hypertension Father   . Diabetes Mellitus II Father   . Hypertension Brother   . Diabetes Mellitus I Brother   . Hypertension Brother   . Colon cancer Neg Hx   . Stomach cancer Neg Hx   . Colon polyps Neg Hx   .  Esophageal cancer Neg Hx   . Rectal cancer Neg Hx     Social History   Tobacco Use  . Smoking status: Current Every Day Smoker    Packs/day: 0.50    Years: 40.00    Pack years: 20.00    Types: Cigarettes  . Smokeless tobacco: Never Used  Vaping Use  . Vaping Use: Never used  Substance Use Topics  . Alcohol use: No    Comment: quit around 2010; no h/o heavy use  . Drug use: No    Home Medications Prior to Admission medications   Medication Sig Start Date End Date Taking? Authorizing Provider  acetaminophen (TYLENOL) 325 MG tablet Take 650 mg by mouth every 6 (six) hours as needed.   Yes [provider]  amLODipine (NORVASC) 5 MG tablet  12/24/19  Yes [provider]  ferric citrate (AURYXIA) 1 GM 210 MG(Fe) tablet Take 2 tablets by mouth 3 (three) times daily.   Yes [provider]  insulin aspart protamine- aspart (NOVOLOG MIX 70/30) (70-30) 100 UNIT/ML injection Inject 0.05 mLs (5 Units total) into the skin at bedtime. Patient taking differently: Inject 5-10 Units into the skin at bedtime. Depending on blood sugar. 02/07/17  Yes Eugenie Filler, MD  pantoprazole (PROTONIX) 20 MG tablet Take 1 tablet (20 mg total) by mouth daily. 06/29/19  Yes Armbruster, Carlota Raspberry, MD    Allergies    Penicillins, Nsaids, and Aspirin  Review of Systems   Review of Systems  Constitutional: Positive for appetite change and fever (subjective).  Respiratory: Negative for cough and shortness of breath.   Cardiovascular: Positive for leg swelling (foot). Negative for chest pain.  Gastrointestinal: Negative for abdominal pain, nausea and vomiting.  Musculoskeletal: Negative for arthralgias (denies given neuropathy).  Skin: Positive for rash and wound.  Neurological: Negative for headaches.    Physical Exam Updated Vital Signs BP (!) 146/83   Pulse 92   Temp 98.7 F (37.1 C) (Oral)   Resp (!) 23   SpO2 96%   Physical Exam Vitals and nursing note reviewed.    Constitutional:      General: He is not in acute distress.    Appearance: Normal appearance. He is not ill-appearing, toxic-appearing or diaphoretic.  HENT:     Head: Normocephalic.  Eyes:     Conjunctiva/sclera: Conjunctivae normal.  Cardiovascular:     Rate and Rhythm: Normal rate and regular rhythm.     Pulses: Normal pulses.     Comments: Pulses 2-3+ LLE, 1+ RLE limited by swelling (confirmed with strong doppler signal)  Pulmonary:     Effort: Pulmonary effort is normal. No respiratory distress.  Musculoskeletal:        General: No deformity or signs of injury.     Cervical back: No rigidity.  Skin:    General: Skin is warm and dry.  Coloration: Skin is not jaundiced or pale.  Neurological:     General: No focal deficit present.     Mental Status: He is alert and oriented to person, place, and time.              ED Results / Procedures / Treatments   Labs (all labs ordered are listed, but only abnormal results are displayed) Labs Reviewed  CBC - Abnormal; Notable for the following components:      Result Value   WBC 20.0 (*)    RBC 3.84 (*)    MCV 102.9 (*)    MCH 34.1 (*)    All other components within normal limits  BASIC METABOLIC PANEL - Abnormal; Notable for the following components:   Sodium 125 (*)    Chloride 87 (*)    Glucose, Bld 145 (*)    BUN 42 (*)    Creatinine, Ser 6.97 (*)    GFR calc non Af Amer 8 (*)    GFR calc Af Amer 9 (*)    All other components within normal limits  HEPATIC FUNCTION PANEL - Abnormal; Notable for the following components:   Albumin 3.0 (*)    Alkaline Phosphatase 129 (*)    Total Bilirubin 2.0 (*)    Bilirubin, Direct 0.6 (*)    Indirect Bilirubin 1.4 (*)    All other components within normal limits  HEMOGLOBIN A1C - Abnormal; Notable for the following components:   Hgb A1c MFr Bld 6.5 (*)    All other components within normal limits  C-REACTIVE PROTEIN - Abnormal; Notable for the following components:    CRP 12.4 (*)    All other components within normal limits  PREALBUMIN - Abnormal; Notable for the following components:   Prealbumin 6.8 (*)    All other components within normal limits  CULTURE, BLOOD (ROUTINE X 2)  CULTURE, BLOOD (ROUTINE X 2)  SARS CORONAVIRUS 2 BY RT PCR (HOSPITAL ORDER, [facility])  LACTIC ACID, PLASMA  APTT  PROTIME-INR  SEDIMENTATION RATE  HIV ANTIBODY (ROUTINE TESTING W REFLEX)    EKG None  Radiology DG Ankle Complete Right  Result Date: 03/26/2020 CLINICAL DATA:  Diabetes with neuropathy. Evaluate for osteomyelitis. EXAM: RIGHT ANKLE - COMPLETE 3+ VIEW COMPARISON:  None. FINDINGS: Ankle mortise is normal. Mild diffuse decreased bone mineralization. Moderate degenerative changes over the midfoot and hindfoot as well as the tibiotalar joint. No evidence of bone destruction to suggest osteomyelitis. Spurring over the posterior and inferior calcaneus. IMPRESSION: No acute findings. Electronically Signed   By: Marin Olp M.D.   On: 03/26/2020 10:49   DG Foot Complete Right  Result Date: 03/26/2020 CLINICAL DATA:  History of diabetes and neuropathy. Open wound between fourth and fifth toes 1 week. Evaluate for osteomyelitis. EXAM: RIGHT FOOT COMPLETE - 3+ VIEW COMPARISON:  None. FINDINGS: Soft tissue wound over the plantar surface of the forefoot adjacent the fifth MTP joint. No significant air in the soft tissues. Slight lucency over the head of the fifth metatarsal on the AP view without discrete bone destruction. No other areas of bone destruction to suggest osteomyelitis. There is moderate degenerative change over the first MTP joint and midfoot/hindfoot. Spurring over the posterior and inferior calcaneus. IMPRESSION: 1. Soft tissue wound over the plantar aspect of the forefoot adjacent the fifth MTP joint. Subtle lucency over the head of the fifth metatarsal mildly suspicious for osteomyelitis. No discrete bone destruction. 2.  Moderate  degenerative changes as  described Electronically Signed   By: Marin Olp M.D.   On: 03/26/2020 10:41   DG Toe 5th Right  Result Date: 03/26/2020 CLINICAL DATA:  Toe infection EXAM: RIGHT FIFTH TOE COMPARISON:  None. FINDINGS: There is question of periosteal reaction seen with cortical step-off at the base of the distal fifth phalanx. Diffuse overlying soft tissue swelling with subcutaneous emphysema is seen. IMPRESSION: Cortical irregularity and periosteal reaction of the fifth distal phalanx which could be due to osteomyelitis. Overlying soft tissue swelling and subcutaneous emphysema. Electronically Signed   By: Prudencio Pair M.D.   On: 03/26/2020 01:38    Procedures Procedures (including critical care time)  Medications Ordered in ED Medications  amLODipine (NORVASC) tablet 5 mg (has no administration in time range)  pantoprazole (PROTONIX) EC tablet 20 mg (has no administration in time range)  cefTRIAXone (ROCEPHIN) 2 g in sodium chloride 0.9 % 100 mL IVPB (has no administration in time range)  metroNIDAZOLE (FLAGYL) IVPB 500 mg (500 mg Intravenous New Bag/Given 03/26/20 1204)  heparin injection 5,000 Units (has no administration in time range)  acetaminophen (TYLENOL) tablet 650 mg (has no administration in time range)    Or  acetaminophen (TYLENOL) suppository 650 mg (has no administration in time range)  ondansetron (ZOFRAN) tablet 4 mg (has no administration in time range)    Or  ondansetron (ZOFRAN) injection 4 mg (has no administration in time range)  docusate sodium (COLACE) capsule 100 mg (has no administration in time range)  nicotine (NICODERM CQ - dosed in mg/24 hours) patch 14 mg (has no administration in time range)  insulin aspart (novoLOG) injection 0-6 Units (has no administration in time range)  vancomycin (VANCOCIN) IVPB 1000 mg/200 mL premix (has no administration in time range)  vancomycin (VANCOREADY) IVPB 1750 mg/350 mL (1,750 mg Intravenous New Bag/Given 03/26/20  0949)    ED Course  I have reviewed the triage vital signs and the nursing notes.  Pertinent labs & imaging results that were available during my care of the patient were reviewed by me and considered in my medical decision making (see chart for details).    MDM Rules/Calculators/A&P                          59 year old male with history of diabetes, hypertension, CVA, hyperlipidemia, splenectomy, ESRD on dialysis who has been [facility] with [facility] with concern for right diabetic foot wound, recently started on doxycycline on Friday, presents with concern for worsening erythema.  Exam and history concerning for diabetic foot wound with necrotic tissue, worsening cellulitis with osteomyelitis on XR and failure of outpatient antibiotics. He is hemodynamically stable.  Labs significant for leukocytosis of 20,000, sodium of 125.  Given vanc/cefepime.   Consulted [facility], Dr. Jacqualyn Posey to come this evening to evaluate pt. Admitted to hospitalist for further care.    Final Clinical Impression(s) / ED Diagnoses Final diagnoses:  Cellulitis of right lower extremity  Gangrene of toe of right foot ([place])  Osteomyelitis of fifth toe of left foot [facility])    Rx / DC Orders ED Discharge Orders    None       Gareth Morgan, MD 03/26/20 1512

## 2020-03-26 NOTE — Consult Note (Signed)
Renal Service Consult Note [facility]  Brian [name] 03/26/2020 Brian [name] Requesting Physician:  Dr Brian [name], Brian [name].   Reason for Consult:  ESRD pt w/ foot infection HPI: The patient is a 59 y.o. year-old hx of CVA, HTN, tobacco use, cryptogenic cirrhosis, DM2 and ESRD on HD (3-4 yrs) came to ED came to ED yest w/ infection of R foot. Found to have foot w/ marked erythema, odor and some black tissue on one of the toes. Does not have any feeling in the foot. Was getting home po abx.  +low grade fevers. Pt admitted and started on IV abx Vanc / maxipime. Xray showed probable osteomyelitis involving the 5th toe. Podiatry has been consulted, ABI's ordered. Asked to see for ESRD.    Pt denies any SOB, leg edema, missed HD, AVF issues. On HD 3.5 yrs.   ROS  denies CP  no joint pain   no HA  no blurry vision  no rash  no diarrhea  no nausea/ vomiting      Past Medical History  Past Medical History:  Diagnosis Date  . Arthritis   . Bleeding ulcer   . Complication of anesthesia    " I had a nose bleed a day or two after surgery-it could have been the oxygen"  . Diabetes mellitus without complication ([place])   . Diabetic neuropathy ([place])   . Elevated TSH   . ESRD on dialysis [facility])    MWF  . History of kidney stones   . Hypertension   . Stroke [facility])    hemorrhagic 2010  . Tobacco abuse    Past Surgical History  Past Surgical History:  Procedure Laterality Date  . A/V FISTULAGRAM N/A 02/05/2018   Procedure: A/V FISTULAGRAM;  Surgeon: Brian [place], MD;  Location: [place] CV LAB;  Service: Cardiovascular;  Laterality: N/A;  . AV FISTULA PLACEMENT Left 05/16/2017   Procedure: ARTERIOVENOUS (AV) FISTULA CREATION-LEFT;  Surgeon: Brian Mould, MD;  Location: [place];  Service: Vascular;  Laterality: Left;  . CHOLECYSTECTOMY    . COLONOSCOPY    . ESOPHAGOGASTRODUODENOSCOPY N/A 01/23/2018   Procedure: ESOPHAGOGASTRODUODENOSCOPY (EGD);  Surgeon: Brian Flock, MD;  Location: [facility] ENDOSCOPY;  Service: Gastroenterology;  Laterality: N/A;  . PANCREAS SURGERY    . PERIPHERAL VASCULAR BALLOON ANGIOPLASTY  02/05/2018   Procedure: PERIPHERAL VASCULAR BALLOON ANGIOPLASTY;  Surgeon: Brian [place], MD;  Location: [place] CV LAB;  Service: Cardiovascular;;  left AV fistula  . SPLENECTOMY     Family History  Family History  Problem Relation Age of Onset  . Lymphoma Mother   . Hypertension Father   . Diabetes Mellitus II Father   . Hypertension Brother   . Diabetes Mellitus I Brother   . Hypertension Brother   . Colon cancer Neg Hx   . Stomach cancer Neg Hx   . Colon polyps Neg Hx   . Esophageal cancer Neg Hx   . Rectal cancer Neg Hx    Social History  reports that he has been smoking cigarettes. He has a 20.00 pack-year smoking history. He has never used smokeless tobacco. He reports that he does not drink alcohol and does not use drugs. Allergies  Allergies  Allergen Reactions  . Penicillins Hives and Rash    Has patient had a PCN reaction causing immediate rash, facial/tongue/throat swelling, SOB or lightheadedness with hypotension: Yes Has patient had a PCN reaction causing severe rash involving mucus membranes or skin necrosis: No  Has patient had a PCN reaction that required hospitalization: No Has patient had a PCN reaction occurring within the last 10 years: No If all of the above answers are "NO", then may proceed with Cephalosporin use.   . Nsaids     Due to kidneys  . Aspirin Nausea And Vomiting    Other Reaction: GI Upset   Home medications Prior to Admission medications   Medication Sig Start Date End Date Taking? Authorizing Provider  acetaminophen (TYLENOL) 325 MG tablet Take 650 mg by mouth every 6 (six) hours as needed.   Yes [provider]  amLODipine (NORVASC) 5 MG tablet  12/24/19  Yes [provider]  ferric citrate (AURYXIA) 1 GM 210 MG(Fe) tablet Take 2 tablets by mouth 3 (three) times daily.    Yes [provider]  insulin aspart protamine- aspart (NOVOLOG MIX 70/30) (70-30) 100 UNIT/ML injection Inject 0.05 mLs (5 Units total) into the skin at bedtime. Patient taking differently: Inject 5-10 Units into the skin at bedtime. Depending on blood sugar. 02/07/17  Yes Brian Filler, MD  pantoprazole (PROTONIX) 20 MG tablet Take 1 tablet (20 mg total) by mouth daily. 06/29/19  Yes Brian [name], Brian Raspberry, MD     Vitals:   03/26/20 0958 03/26/20 1028 03/26/20 1058 03/26/20 1117  BP: (!) 163/98 139/85 (!) 144/86 (!) 146/83  Pulse: 89 87 89 92  Resp:    (!) 23  Temp:      TempSrc:      SpO2: 98% (!) 88% 98% 96%   Exam Gen alert, no distress, calm No rash, cyanosis or gangrene Sclera anicteric, throat clear  No jvd or bruits Chest clear bilat to bases no rales, wheezing or bronchial B RRR no MRG Abd soft ntnd no mass or ascites +bs GU normal male  MS no deformity Ext R foot sig erythema, odor; no pretib edema Neuro is alert, Ox 3 , nf LUA AVF+bruit   Home meds:  - norvasc 5 qd  - auryxia 2 gm tid ac  - insulin aspart 5-10 u hs  - pantoprazole 20 qd    OP HD: [place] MWF   4h  400/800  82kg 2/2 bath  AVF L   Hep 3000  - hect 5  - no esa     Assessment/ Plan: 1. R diabetic foot infection / osteo - per primary team, on IV abx  2. ESRD - on HD MWF.  Plan for HD tomorrow.  3. HTN/ vol - BP's up, Na+ down, UF 3 L as tol tomorrow 4. DM2 on insulin 5. Anemia ckd - no esa Hb 13       Brian Symphony Demuro  MD 03/26/2020, 3:06 PM  Recent Labs  Lab 03/26/20 0105  WBC 20.0*  HGB 13.1   Recent Labs  Lab 03/26/20 0105  K 3.8  BUN 42*  CREATININE 6.97*  CALCIUM 8.9

## 2020-03-26 NOTE — ED Triage Notes (Addendum)
Pt presents to ED POv. Pt c/of toe infection on R 5th toe, followed by wound clinic, been taking doxycycline. Toe still worsens. Some black tissue on toe, malodorous. Pt reports no pain d/t neuropathy on foot. RN marked red area on foot. And rewrapped wound.

## 2020-03-26 NOTE — Telephone Encounter (Signed)
Received text that this patient is being admitted to hospital.  He has a wound infection.  He has been seen by Dr.  Jacqualyn Posey on 6/10 and then Dr.  Posey Pr[place] 6/18.  This text was sent to [facility] who will contact a doctor with hospital privileges.  Gardiner Barefoot DPM

## 2020-03-26 NOTE — Telephone Encounter (Signed)
[name] called to see if I received her initial message this morning about Brian [name]. Told her that the message was forwarded to [facility] who contacted Dr.  Jacqualyn Posey.  Dr. Jacqualyn Posey was to visit this patient this evening.  The answering service also called to see if I received the call about [name].   I told them both that the matter was taken care.  Gardiner Barefoot DPM

## 2020-03-26 NOTE — Progress Notes (Signed)
Subjective:  Patient ID: Brian [name], male    DOB: [birth year],  MRN: [id number]  Chief Complaint  Patient presents with  . Foot Pain    pt is here for a f/u of the right foot, pt states that he is concerned due to the swelling in the right foot, pt states that his pain is elevated to the touch.     59 y.o. male presents for wound care.  Patient presents with a follow-up of right fifth digit diabetic foot infection.  Patient states it started draining.  Patient is well-known to Dr. Jacqualyn Posey.  He denies any taking antibiotics.  He states that it came out of nowhere.  Patient states he does not have any pain due to neuropathy.  Patient is concerned the redness may start to get worse and is worried about losing his toe.  He has been keeping it dry as well.  He denies any other acute complaints.   Review of Systems: Negative except as noted in the HPI. Denies N/V/F/Ch.  Past Medical History:  Diagnosis Date  . Arthritis   . Bleeding ulcer   . Complication of anesthesia    " I had a nose bleed a day or two after surgery-it could have been the oxygen"  . Diabetes mellitus without complication ([place])   . Diabetic neuropathy ([place])   . Elevated TSH   . ESRD on dialysis [facility])    MWF  . History of kidney stones   . Hypertension   . Stroke [facility])    hemorrhagic 2010  . Tobacco abuse    No current facility-administered medications for this visit. No current outpatient medications on file.  Facility-Administered Medications Ordered in Other Visits:  .  acetaminophen (TYLENOL) tablet 650 mg, 650 mg, Oral, Q6H PRN **OR** acetaminophen (TYLENOL) suppository 650 mg, 650 mg, Rectal, Q6H PRN, [name], MD .  amLODipine (NORVASC) tablet 5 mg, 5 mg, Oral, Daily, [name], MD .  cefTRIAXone (ROCEPHIN) 2 g in sodium chloride 0.9 % 100 mL IVPB, 2 g, Intravenous, Q24H, [name], MD, Stopped at 03/26/20 1451 .  [START ON 03/27/2020] Chlorhexidine Gluconate Cloth 2 % PADS 6 each, 6 each,  Topical, Q0600, Roney Jaffe, MD .  docusate sodium (COLACE) capsule 100 mg, 100 mg, Oral, BID, [name], MD .  ferric citrate (AURYXIA) tablet 420 mg, 420 mg, Oral, TID WC, [name], MD, 420 mg at 03/26/20 1826 .  heparin injection 5,000 Units, 5,000 Units, Subcutaneous, Q8H, [name], MD .  insulin aspart (novoLOG) injection 0-6 Units, 0-6 Units, Subcutaneous, TID WC, [name], MD .  metroNIDAZOLE (FLAGYL) IVPB 500 mg, 500 mg, Intravenous, Q8H, [name], MD, Last Rate: 100 mL/hr at 03/26/20 1756, 500 mg at 03/26/20 1756 .  nicotine (NICODERM CQ - dosed in mg/24 hours) patch 14 mg, 14 mg, Transdermal, Daily, [name], MD .  ondansetron [facility]) tablet 4 mg, 4 mg, Oral, Q6H PRN **OR** ondansetron (ZOFRAN) injection 4 mg, 4 mg, Intravenous, Q6H PRN, [name], MD .  pantoprazole (PROTONIX) EC tablet 20 mg, 20 mg, Oral, Daily, [name], MD .  Derrill Memo ON 03/27/2020] vancomycin (VANCOCIN) IVPB 1000 mg/200 mL premix, 1,000 mg, Intravenous, Q M,W,F-HD, Duanne Limerick, RPH  Social History   Tobacco Use  Smoking Status Current Every Day Smoker  . Packs/day: 0.50  . Years: 40.00  . Pack years: 20.00  . Types: Cigarettes  Smokeless Tobacco Never Used    Allergies  Allergen Reactions  . Penicillins Hives and  Rash    Has patient had a PCN reaction causing immediate rash, facial/tongue/throat swelling, SOB or lightheadedness with hypotension: Yes Has patient had a PCN reaction causing severe rash involving mucus membranes or skin necrosis: No Has patient had a PCN reaction that required hospitalization: No Has patient had a PCN reaction occurring within the last 10 years: No If all of the above answers are "NO", then may proceed with Cephalosporin use.   . Nsaids     Due to kidneys  . Aspirin Nausea And Vomiting    Other Reaction: GI Upset   Objective:  There were no vitals filed for this visit. There is no height or weight on file to  calculate BMI. Constitutional Well developed. Well nourished.  Vascular Dorsalis pedis nonpalpable bilaterally. Posterior tibial nonpalpable bilaterally Capillary refill normal to all digits.  No cyanosis or clubbing noted. Pedal hair growth normal.  Neurologic Normal speech. Oriented to person, place, and time. Protective sensation absent  Dermatologic  right fifth digit diabetic foot ulcer with erythema.  There is some swelling surrounding the fifth digit as well.  No fluctuance or crepitus noted.  Orthopedic: No pain to palpation either foot.   Radiographs: 3 views of skeletally mature adult right foot: Cortical irregularity noted at the head of the fifth metatarsal concern for possible osteomyelitis.  No soft tissue emphysema noted.  Soft tissue air noted consistent with the wound. Assessment:   1. Diabetic ulcer of other part of right foot associated with diabetes mellitus due to underlying condition, with fat layer exposed ([place])    Plan:  Patient was evaluated and treated and all questions answered.  Right fifth digit diabetic foot infection with possible concern for possible underlying osteomyelitis -I discussed with the patient and various treatment options in extensive detail including local wound care with conservative care versus surgical management/amputation with admission to hospital.  Patient was a little bit hesitant to being admitted to the hospital for amputation.  However, patient wanted to try doing antibiotics over the next couple of days in an effort to control the infection.  I did discuss with him that if this does tend to work get worse he will need to be admitted to the hospital go to the emergency room right away for IV antibiotics and a possible amputation.  I explained to him that if he does go to the emergency room to contact me or Dr. Jacqualyn Posey right away if the infection continues to get worse.  Patient states understanding he states that he will do that. -I have  asked patient to go to the emergency room right away if the infection worsens or if the redness continues to get worse.  Patient states understanding will do so right away. -Local wound care with Betadine wet-to-dry dressing -Doxycycline was dispensed for skin and soft tissue prophylaxis -Continue wearing surgical shoe to offload the fifth digit  No follow-ups on file.

## 2020-03-26 NOTE — H&P (Signed)
History and Physical    Brian [name] LZJ:[id number] DOB: April 02, 1961 DOA: 03/26/2020  PCP: [name], MD Consultants:  Jacqualyn Posey - podiatry; [facility]bruster - GI Patient coming from:  Home - lives alone; NOK: Gordy Clement, [phone]   Chief Complaint: Diabetic foot infection  HPI: Brian [name] is a 59 y.o. male with medical history significant of CVA; HTN; DM; cryptogenic cirrhosis; and ESRD on HD presenting with a toe infection.  He started with a bad place on the bottom of his right foot, a whole in it.  He has been seeing Dr. Jacqualyn Posey for that and it has progressed with infection.  He thinks this has all started about a month ago but there were no signs of infection until a wee or so ago.  His toe has become frankly infected.  No h/o PAD.  Only his R foot is affected.  On and off low-grade fevers for a couple of days.  He has been feeling a little queasy and has been eating light.  He recognizes that he is likely to need amputation.    ED Course:  Diabetic foot wound, treated with doxy and failed.  Worsening cellulitis and drainage from R 5th toe to metatarsal.  Given Vanc/Cefepime.  Dr. Erlinda Hong discussed, encouraged podiatry consultation instead.  Xrays ordered.  Review of Systems: As per HPI; otherwise review of systems reviewed and negative.   Ambulatory Status:  Ambulates without assistance  COVID Vaccine Status:  Complete  Past Medical History:  Diagnosis Date  . Arthritis   . Bleeding ulcer   . Complication of anesthesia    " I had a nose bleed a day or two after surgery-it could have been the oxygen"  . Diabetes mellitus without complication ([place])   . Diabetic neuropathy ([place])   . Elevated TSH   . ESRD on dialysis [facility])    MWF  . History of kidney stones   . Hypertension   . Stroke [facility])    hemorrhagic 2010  . Tobacco abuse     Past Surgical History:  Procedure Laterality Date  . A/V FISTULAGRAM N/A 02/05/2018   Procedure: A/V FISTULAGRAM;  Surgeon:  Conrad Cassoday, MD;  Location: [place] CV LAB;  Service: Cardiovascular;  Laterality: N/A;  . AV FISTULA PLACEMENT Left 05/16/2017   Procedure: ARTERIOVENOUS (AV) FISTULA CREATION-LEFT;  Surgeon: Angelia Mould, MD;  Location: [place];  Service: Vascular;  Laterality: Left;  . CHOLECYSTECTOMY    . COLONOSCOPY    . ESOPHAGOGASTRODUODENOSCOPY N/A 01/23/2018   Procedure: ESOPHAGOGASTRODUODENOSCOPY (EGD);  Surgeon: Yetta Flock, MD;  Location: [facility] ENDOSCOPY;  Service: Gastroenterology;  Laterality: N/A;  . PANCREAS SURGERY    . PERIPHERAL VASCULAR BALLOON ANGIOPLASTY  02/05/2018   Procedure: PERIPHERAL VASCULAR BALLOON ANGIOPLASTY;  Surgeon: Conrad Bruin, MD;  Location: [place] CV LAB;  Service: Cardiovascular;;  left AV fistula  . SPLENECTOMY      Social History   Socioeconomic History  . Marital status: Widowed    Spouse name: Not on file  . Number of children: Not on file  . Years of education: Not on file  . Highest education level: Not on file  Occupational History  . Occupation: disability  Tobacco Use  . Smoking status: Current Every Day Smoker    Packs/day: 0.50    Years: 40.00    Pack years: 20.00    Types: Cigarettes  . Smokeless tobacco: Never Used  Vaping Use  . Vaping Use: Never used  Substance and Sexual Activity  . Alcohol use: No    Comment: quit around 2010; no h/o heavy use  . Drug use: No  . Sexual activity: Not on file  Other Topics Concern  . Not on file  Social History Narrative  . Not on file   Social Determinants of Health   Financial Resource Strain:   . Difficulty of Paying Living Expenses:   Food Insecurity:   . Worried About Charity fundraiser in the Last Year:   . Arboriculturist in the Last Year:   Transportation Needs:   . Film/video editor (Medical):   Marland Kitchen Lack of Transportation (Non-Medical):   Physical Activity:   . Days of Exercise per Week:   . Minutes of Exercise per Session:   Stress:   . Feeling of Stress  :   Social Connections:   . Frequency of Communication with Friends and Family:   . Frequency of Social Gatherings with Friends and Family:   . Attends Religious Services:   . Active Member of Clubs or Organizations:   . Attends Archivist Meetings:   Marland Kitchen Marital Status:   Intimate Partner Violence:   . Fear of Current or Ex-Partner:   . Emotionally Abused:   Marland Kitchen Physically Abused:   . Sexually Abused:     Allergies  Allergen Reactions  . Penicillins Hives and Rash    Has patient had a PCN reaction causing immediate rash, facial/tongue/throat swelling, SOB or lightheadedness with hypotension: Yes Has patient had a PCN reaction causing severe rash involving mucus membranes or skin necrosis: No Has patient had a PCN reaction that required hospitalization: No Has patient had a PCN reaction occurring within the last 10 years: No If all of the above answers are "NO", then may proceed with Cephalosporin use.   . Nsaids     Due to kidneys  . Aspirin Nausea And Vomiting    Other Reaction: GI Upset    Family History  Problem Relation Age of Onset  . Lymphoma Mother   . Hypertension Father   . Diabetes Mellitus II Father   . Hypertension Brother   . Diabetes Mellitus I Brother   . Hypertension Brother   . Colon cancer Neg Hx   . Stomach cancer Neg Hx   . Colon polyps Neg Hx   . Esophageal cancer Neg Hx   . Rectal cancer Neg Hx     Prior to Admission medications   Medication Sig Start Date End Date Taking? Authorizing Provider  acetaminophen (TYLENOL) 325 MG tablet Take 650 mg by mouth every 6 (six) hours as needed.    [provider]  amLODipine (NORVASC) 5 MG tablet  12/24/19   [provider]  doxycycline (VIBRA-TABS) 100 MG tablet Take 1 tablet (100 mg total) by mouth 2 (two) times daily. 03/24/20   Felipa Furnace, DPM  ferric citrate (AURYXIA) 1 GM 210 MG(Fe) tablet Take 2 tablets by mouth 3 (three) times daily.    [provider]  insulin  aspart protamine- aspart (NOVOLOG MIX 70/30) (70-30) 100 UNIT/ML injection Inject 0.05 mLs (5 Units total) into the skin at bedtime. Patient taking differently: Inject 5-10 Units into the skin at bedtime. Depending on blood sugar. 02/07/17   Eugenie Filler, MD  pantoprazole (PROTONIX) 20 MG tablet Take 1 tablet (20 mg total) by mouth daily. 06/29/19   Yetta Flock, MD    Physical Exam: Vitals:   03/26/20 5093 03/26/20  0316  BP: (!) 164/89 (!) 150/88  Pulse: 94 79  Resp: 18 17  Temp: 98.7 F (37.1 C)   TempSrc: Oral   SpO2: 96% 98%     . General:  Appears calm and comfortable and is NAD . Eyes:  PERRL, EOMI, normal lids, iris . ENT:  grossly normal hearing, lips & tongue, mmm . Neck:  no LAD, masses or thyromegaly . Cardiovascular:  RRR, no m/r/g. No LE edema.  Marland Kitchen Respiratory:   CTA bilaterally with no wheezes/rales/rhonchi.  Normal respiratory effort. . Abdomen:  soft, NT, ND, NABS . Skin:  Right foot with necrotic lesion on 5th toe as well as plantar ulceration along 5th metatarsal.  +foul-smelling drainage.  +surrounding erythema/edema on the foot and also a well circumscribed patch of erythema on the right anterior lower leg          . Musculoskeletal:  grossly normal tone BUE/BLE, good ROM, diabetic foot infection as noted above . Psychiatric:  grossly normal mood and affect, speech fluent and appropriate, AOx3 . Neurologic:  CN 2-12 grossly intact, moves all extremities in coordinated fashion    Radiological Exams on Admission: DG Foot Complete Right  Result Date: 03/26/2020 CLINICAL DATA:  History of diabetes and neuropathy. Open wound between fourth and fifth toes 1 week. Evaluate for osteomyelitis. EXAM: RIGHT FOOT COMPLETE - 3+ VIEW COMPARISON:  None. FINDINGS: Soft tissue wound over the plantar surface of the forefoot adjacent the fifth MTP joint. No significant air in the soft tissues. Slight lucency over the head of the fifth metatarsal on the AP view  without discrete bone destruction. No other areas of bone destruction to suggest osteomyelitis. There is moderate degenerative change over the first MTP joint and midfoot/hindfoot. Spurring over the posterior and inferior calcaneus. IMPRESSION: 1. Soft tissue wound over the plantar aspect of the forefoot adjacent the fifth MTP joint. Subtle lucency over the head of the fifth metatarsal mildly suspicious for osteomyelitis. No discrete bone destruction. 2.  Moderate degenerative changes as described Electronically Signed   By: Marin Olp M.D.   On: 03/26/2020 10:41   DG Toe 5th Right  Result Date: 03/26/2020 CLINICAL DATA:  Toe infection EXAM: RIGHT FIFTH TOE COMPARISON:  None. FINDINGS: There is question of periosteal reaction seen with cortical step-off at the base of the distal fifth phalanx. Diffuse overlying soft tissue swelling with subcutaneous emphysema is seen. IMPRESSION: Cortical irregularity and periosteal reaction of the fifth distal phalanx which could be due to osteomyelitis. Overlying soft tissue swelling and subcutaneous emphysema. Electronically Signed   By: Prudencio Pair M.D.   On: 03/26/2020 01:38    EKG:  pending   Labs on Admission: I have personally reviewed the available labs and imaging studies at the time of the admission.  Pertinent labs:   Na++ 125 Glucose 145 BUN 42/Creatinine 6.97/GFR 8 Albumin 3.0 WBC 20.0 INR 1.0 Blood culture pending   Assessment/Plan Principal Problem:   Diabetic infection of right foot (HCC) Active Problems:   ESRD on dialysis ([place])   Hypertension   Tobacco abuse- >er than 55 pack yr hx; current smoker   Type 1 diabetes mellitus with kidney complication (HCC) - on insulin   H/O hyperlipidemia- sev yrs ago   Diabetic foot infection, R lateral foot -Patient has been followed by Dr. Jacqualyn Posey from podiatry for a R foot 5th submetatarsal infection; it was sharply debrided on 6/10 -He was seen again on 6/18 by Dr. Posey Pr[place] and was given  doxycycline; xray  was performed and that result is pending -Meanwhile, it has not improved at all on doxy and so the patient came to the ER today -Foot xray shows cortical irregularity and periosteal reaction of the fifth distal phalanx, likely from osteomyelitis as well as subtle lucency over the head of the 5th metatarsal that is also suspicious -Foot ulcer is present and draining and now the patient has surrounding cellulitis -Negative lactate, no current concerns for sepsis -Will treat with IV antibiotics (Rocephin, Flagyl and Vancomycin as per the diabetic foot ulcer algorithm) -Dr. Erlinda Hong was initially contacted by the ER but he requested podiatry consultation instead; Dr. Prudence Davidson will notify one of his partner's will hospital privileges that the patient will need evaluation -Based on refractory ulcer infection with osteitis, it seems that the patient may be heading for amputation -I have ordered ABIs in case revascularization may be indicated -Patient is NPO after midnight in case he needs a procedure tomorrow -LE wound order set utilized including labs (CRP, ESR, A1c, prealbumin, HIV, and blood cultures) and consults (TOC team; diabetes coordinator; peripheral vascular navigator; wound care; and nutrition)  ESRD on HD -Patient on chronic MWF HD -Nephrology prn order set utilized -He does not appear to be volume overloaded or otherwise in need of acute HD -Dr. Jonnie Finner has been notified that patient will need HD tomorrow  DM -Prior A1c was 6.0, indicating good control -hold 70/30 (he appears to use this on a sliding scale, which is suboptimal) -Cover with very sensitive-scale SSI  HTN -Continue Norvasc  HLD -He does not appear to be taking medications for this issue at this time  Tobacco dependence -Encourage cessation; he appears to be quite pre-contemplative at this time. -This was discussed with the patient and should be reviewed on an ongoing basis.   -Patch ordered at patient  request.   Note: This patient has been tested and is pending for the novel coronavirus COVID-19.  DVT prophylaxis: Heparin Code Status:  Full - confirmed with patient Family Communication: None present; he is capable of communicating with family at this time Disposition Plan:  The patient is from: home  Anticipated d/c is to: home vs. SNF - to be determined since he lives alone and appears to need amputation  Anticipated d/c date will depend on clinical response to treatment, likely post-operatively  Patient is currently: acutely ill Consults called: Podiatry; TOC team; diabetes coordinator; peripheral vascular navigator; wound care; and nutrition Admission status:  Admit - It is my clinical opinion that admission to INPATIENT is reasonable and necessary because of the expectation that this patient will require hospital care that crosses at least 2 midnights to treat this condition based on the medical complexity of the problems presented.  Given the aforementioned information, the predictability of an adverse outcome is felt to be significant.    [name] MD [facility]   How to contact the [facility] Attending or Consulting provider [place] or covering provider during after hours [place], for this patient?  1. Check the care team in [facility] and look for a) attending/consulting TRH provider listed and b) the [facility] team listed 2. Log into [email or web address] and use [place]'s universal password to access. If you do not have the password, please contact the hospital operator. 3. Locate the [facility] provider you are looking for under [facility] and page to a number that you can be directly reached. 4. If you still have difficulty reaching the provider, please page the [facility] (Director on  Call) for the Hospitalists listed on amion for assistance.   03/26/2020, 10:44 AM

## 2020-03-26 NOTE — Progress Notes (Addendum)
Patient trasfered from ED to [phone] via stretcher; alert and oriented x 4; no complaints of pain; IV saline locked in RH; AV fistula LFA; skin dry; R foot diabetic wound and psoriasis on LLE. Orient patient to room and unit; gave patient care guide; instructed how to use the call bell and  fall risk precautions. Will continue to monitor the patient.

## 2020-03-27 ENCOUNTER — Inpatient Hospital Stay (HOSPITAL_COMMUNITY): Payer: Medicare Other

## 2020-03-27 ENCOUNTER — Encounter (HOSPITAL_COMMUNITY): Payer: Medicare Other

## 2020-03-27 ENCOUNTER — Encounter (HOSPITAL_COMMUNITY)
Admission: EM | Disposition: A | Discharge status: Home Health | Payer: Self-pay | Source: Home / Self Care | Attending: Internal Medicine

## 2020-03-27 ENCOUNTER — Inpatient Hospital Stay (HOSPITAL_COMMUNITY): Payer: Medicare Other | Admitting: Anesthesiology

## 2020-03-27 ENCOUNTER — Encounter (HOSPITAL_COMMUNITY): Payer: Self-pay | Admitting: Internal Medicine

## 2020-03-27 DIAGNOSIS — L089 Local infection of the skin and subcutaneous tissue, unspecified: Secondary | ICD-10-CM

## 2020-03-27 DIAGNOSIS — E11628 Type 2 diabetes mellitus with other skin complications: Secondary | ICD-10-CM

## 2020-03-27 DIAGNOSIS — M86671 Other chronic osteomyelitis, right ankle and foot: Secondary | ICD-10-CM

## 2020-03-27 DIAGNOSIS — L02611 Cutaneous abscess of right foot: Secondary | ICD-10-CM

## 2020-03-27 HISTORY — PX: AMPUTATION: SHX166

## 2020-03-27 LAB — CBC
HCT: 35.3 % — ABNORMAL LOW (ref 39.0–52.0)
Hemoglobin: 11.8 g/dL — ABNORMAL LOW (ref 13.0–17.0)
MCH: 33.6 pg (ref 26.0–34.0)
MCHC: 33.4 g/dL (ref 30.0–36.0)
MCV: 100.6 fL — ABNORMAL HIGH (ref 80.0–100.0)
Platelets: 322 10*3/uL (ref 150–400)
RBC: 3.51 MIL/uL — ABNORMAL LOW (ref 4.22–5.81)
RDW: 11.9 % (ref 11.5–15.5)
WBC: 15.1 10*3/uL — ABNORMAL HIGH (ref 4.0–10.5)
nRBC: 0 % (ref 0.0–0.2)

## 2020-03-27 LAB — GLUCOSE, CAPILLARY
Glucose-Capillary: 139 mg/dL — ABNORMAL HIGH (ref 70–99)
Glucose-Capillary: 137 mg/dL — ABNORMAL HIGH (ref 70–99)
Glucose-Capillary: 132 mg/dL — ABNORMAL HIGH (ref 70–99)
Glucose-Capillary: 226 mg/dL — ABNORMAL HIGH (ref 70–99)

## 2020-03-27 LAB — BASIC METABOLIC PANEL
Anion gap: 15 (ref 5–15)
BUN: 62 mg/dL — ABNORMAL HIGH (ref 6–20)
CO2: 24 mmol/L (ref 22–32)
Calcium: 8.9 mg/dL (ref 8.9–10.3)
Chloride: 90 mmol/L — ABNORMAL LOW (ref 98–111)
Creatinine, Ser: 9.05 mg/dL — ABNORMAL HIGH (ref 0.61–1.24)
GFR calc Af Amer: 7 mL/min — ABNORMAL LOW (ref >60)
GFR calc non Af Amer: 6 mL/min — ABNORMAL LOW (ref >60)
Glucose, Bld: 125 mg/dL — ABNORMAL HIGH (ref 70–99)
Potassium: 3.8 mmol/L (ref 3.5–5.1)
Sodium: 129 mmol/L — ABNORMAL LOW (ref 135–145)

## 2020-03-27 SURGERY — AMPUTATION, FOOT, RAY
Anesthesia: Monitor Anesthesia Care | Laterality: Right

## 2020-03-27 MED ORDER — HYDRALAZINE HCL 20 MG/ML IJ SOLN: 10.0000 mg | Freq: Four times a day (QID) | INTRAMUSCULAR | Status: DC | PRN

## 2020-03-27 MED ORDER — SODIUM CHLORIDE 0.9 % IV SOLN
INTRAVENOUS | Status: DC
  Administered 2020-03-29: 10 mL via INTRAVENOUS

## 2020-03-27 MED ORDER — CHLORHEXIDINE GLUCONATE 0.12 % MT SOLN
15.0000 mL | Freq: Once | OROMUCOSAL | Status: AC
  Administered 2020-03-27: 15 mL via OROMUCOSAL
  Filled 2020-03-27 (×2): qty 15

## 2020-03-27 MED ORDER — LIDOCAINE 1 % OPTIME INJ - NO CHARGE
INTRAMUSCULAR | Status: DC | PRN
  Administered 2020-03-27 (×2): 5 mL

## 2020-03-27 MED ORDER — LIDOCAINE HCL (PF) 1 % IJ SOLN
INTRAMUSCULAR | Status: AC
  Filled 2020-03-27: qty 30

## 2020-03-27 MED ORDER — MIDAZOLAM HCL 2 MG/2ML IJ SOLN
INTRAMUSCULAR | Status: AC
  Filled 2020-03-27: qty 2

## 2020-03-27 MED ORDER — PROPOFOL 1000 MG/100ML IV EMUL
INTRAVENOUS | Status: AC
  Filled 2020-03-27: qty 100

## 2020-03-27 MED ORDER — FENTANYL CITRATE (PF) 250 MCG/5ML IJ SOLN
INTRAMUSCULAR | Status: AC
  Filled 2020-03-27: qty 5

## 2020-03-27 MED ORDER — RENA-VITE PO TABS
1.0000 | ORAL_TABLET | Freq: Every day | ORAL | Status: DC
  Administered 2020-03-27 – 2020-04-01 (×6): 1 via ORAL
  Filled 2020-03-27 (×6): qty 1

## 2020-03-27 MED ORDER — PROPOFOL 10 MG/ML IV BOLUS
INTRAVENOUS | Status: DC | PRN
  Administered 2020-03-27 (×2): 15 mg via INTRAVENOUS

## 2020-03-27 MED ORDER — BUPIVACAINE HCL 0.5 % IJ SOLN
INTRAMUSCULAR | Status: DC | PRN
  Administered 2020-03-27 (×2): 5 mL

## 2020-03-27 MED ORDER — MIDAZOLAM HCL 2 MG/2ML IJ SOLN
INTRAMUSCULAR | Status: DC | PRN
  Administered 2020-03-27: 2 mg via INTRAVENOUS

## 2020-03-27 MED ORDER — 0.9 % SODIUM CHLORIDE (POUR BTL) OPTIME
TOPICAL | Status: DC | PRN
  Administered 2020-03-27: 1000 mL

## 2020-03-27 MED ORDER — FENTANYL CITRATE (PF) 250 MCG/5ML IJ SOLN
INTRAMUSCULAR | Status: DC | PRN
  Administered 2020-03-27 (×2): 25 ug via INTRAVENOUS

## 2020-03-27 MED ORDER — PRO-STAT SUGAR FREE PO LIQD
30.0000 mL | Freq: Two times a day (BID) | ORAL | Status: DC
  Administered 2020-03-28 – 2020-04-02 (×7): 30 mL via ORAL
  Filled 2020-03-27 (×9): qty 30

## 2020-03-27 MED ORDER — NEPRO/CARBSTEADY PO LIQD
237.0000 mL | Freq: Two times a day (BID) | ORAL | Status: DC
  Administered 2020-03-28 (×2): 237 mL via ORAL

## 2020-03-27 MED ORDER — PROPOFOL 10 MG/ML IV BOLUS
INTRAVENOUS | Status: AC
  Filled 2020-03-27: qty 20

## 2020-03-27 MED ORDER — HEPARIN SODIUM (PORCINE) 1000 UNIT/ML DIALYSIS: 1500.0000 [IU] | INTRAMUSCULAR | Status: DC | PRN

## 2020-03-27 MED ORDER — CEFAZOLIN SODIUM 1 G IJ SOLR
INTRAMUSCULAR | Status: AC
  Filled 2020-03-27: qty 20

## 2020-03-27 MED ORDER — DOXERCALCIFEROL 2.5 MCG PO CAPS
5.0000 ug | ORAL_CAPSULE | ORAL | Status: DC
  Administered 2020-03-31: 5 ug via ORAL
  Filled 2020-03-27 (×3): qty 2

## 2020-03-27 MED ORDER — BUPIVACAINE HCL (PF) 0.5 % IJ SOLN
INTRAMUSCULAR | Status: AC
  Filled 2020-03-27: qty 30

## 2020-03-27 MED ORDER — PROPOFOL 500 MG/50ML IV EMUL
INTRAVENOUS | Status: DC | PRN
  Administered 2020-03-27: 50 ug/kg/min via INTRAVENOUS

## 2020-03-27 MED ORDER — HEPARIN SODIUM (PORCINE) 1000 UNIT/ML IJ SOLN
INTRAMUSCULAR | Status: AC
  Filled 2020-03-27: qty 2

## 2020-03-27 MED ORDER — VANCOMYCIN HCL IN DEXTROSE 1-5 GM/200ML-% IV SOLN
INTRAVENOUS | Status: AC
  Administered 2020-03-27: 1000 mg via INTRAVENOUS
  Filled 2020-03-27: qty 200

## 2020-03-27 SURGICAL SUPPLY — 51 items
BANDAGE ESMARK 6X9 LF (GAUZE/BANDAGES/DRESSINGS) IMPLANT
BLADE AVERAGE 25MMX9MM (BLADE) ×1
BLADE AVERAGE 25X9 (BLADE) ×1 IMPLANT
BLADE SURG 10 STRL SS (BLADE) ×3 IMPLANT
BNDG CMPR 9X6 STRL LF SNTH (GAUZE/BANDAGES/DRESSINGS)
BNDG COHESIVE 4X5 TAN STRL (GAUZE/BANDAGES/DRESSINGS) ×3 IMPLANT
BNDG ELASTIC 4X5.8 VLCR STR LF (GAUZE/BANDAGES/DRESSINGS) ×2 IMPLANT
BNDG ESMARK 6X9 LF (GAUZE/BANDAGES/DRESSINGS)
BNDG GAUZE ELAST 4 BULKY (GAUZE/BANDAGES/DRESSINGS) ×2 IMPLANT
COVER WAND RF STERILE (DRAPES) ×3 IMPLANT
CUFF TOURN SGL QUICK 18X4 (TOURNIQUET CUFF) ×2 IMPLANT
CUFF TOURN SGL QUICK 34 (TOURNIQUET CUFF)
CUFF TOURN SGL QUICK 42 (TOURNIQUET CUFF) IMPLANT
CUFF TRNQT CYL 34X4.125X (TOURNIQUET CUFF) IMPLANT
DECANTER SPIKE VIAL GLASS SM (MISCELLANEOUS) ×3 IMPLANT
DRAPE C-ARM MINI (DRAPES) ×3 IMPLANT
DRAPE U-SHAPE 47X51 STRL (DRAPES) ×6 IMPLANT
DURAPREP 26ML APPLICATOR (WOUND CARE) ×3 IMPLANT
ELECT REM PT RETURN 9FT ADLT (ELECTROSURGICAL) ×3
ELECTRODE REM PT RTRN 9FT ADLT (ELECTROSURGICAL) ×1 IMPLANT
GAUZE SPONGE 4X4 12PLY STRL (GAUZE/BANDAGES/DRESSINGS) ×3 IMPLANT
GAUZE XEROFORM 5X9 LF (GAUZE/BANDAGES/DRESSINGS) ×3 IMPLANT
GLOVE BIO SURGEON STRL SZ8 (GLOVE) ×6 IMPLANT
GOWN STRL REUS W/ TWL LRG LVL3 (GOWN DISPOSABLE) ×1 IMPLANT
GOWN STRL REUS W/ TWL XL LVL3 (GOWN DISPOSABLE) ×1 IMPLANT
GOWN STRL REUS W/TWL LRG LVL3 (GOWN DISPOSABLE) ×3
GOWN STRL REUS W/TWL XL LVL3 (GOWN DISPOSABLE) ×3
KIT BASIN OR (CUSTOM PROCEDURE TRAY) ×3 IMPLANT
KIT TURNOVER KIT B (KITS) ×3 IMPLANT
NDL 25GX 5/8IN NON SAFETY (NEEDLE) ×1 IMPLANT
NEEDLE 25GX 5/8IN NON SAFETY (NEEDLE) ×3 IMPLANT
NS IRRIG 1000ML POUR BTL (IV SOLUTION) ×3 IMPLANT
PACK ORTHO EXTREMITY (CUSTOM PROCEDURE TRAY) ×3 IMPLANT
PAD ABD 8X10 STRL (GAUZE/BANDAGES/DRESSINGS) ×2 IMPLANT
PAD ARMBOARD 7.5X6 YLW CONV (MISCELLANEOUS) ×6 IMPLANT
PAD CAST 4YDX4 CTTN HI CHSV (CAST SUPPLIES) ×1 IMPLANT
PADDING CAST COTTON 4X4 STRL (CAST SUPPLIES) ×3
SPONGE LAP 18X18 RF (DISPOSABLE) ×6 IMPLANT
STAPLER VISISTAT 35W (STAPLE) ×3 IMPLANT
STOCKINETTE IMPERVIOUS LG (DRAPES) IMPLANT
SUT ETHILON 2 0 PSLX (SUTURE) IMPLANT
SUT PROLENE 3 0 PS 2 (SUTURE) ×6 IMPLANT
SWAB COLLECTION DEVICE MRSA (MISCELLANEOUS) IMPLANT
SWAB CULTURE ESWAB REG 1ML (MISCELLANEOUS) IMPLANT
SYR CONTROL 10ML LL (SYRINGE) ×3 IMPLANT
TOWEL GREEN STERILE (TOWEL DISPOSABLE) ×3 IMPLANT
TOWEL GREEN STERILE FF (TOWEL DISPOSABLE) ×3 IMPLANT
TUBE CONNECTING 12'X1/4 (SUCTIONS) ×1
TUBE CONNECTING 12X1/4 (SUCTIONS) ×2 IMPLANT
WATER STERILE IRR 1000ML POUR (IV SOLUTION) ×3 IMPLANT
YANKAUER SUCT BULB TIP NO VENT (SUCTIONS) ×3 IMPLANT

## 2020-03-27 NOTE — Progress Notes (Signed)
PROGRESS NOTE                                                                                                                                                                                                             Patient Demographics:    Brian [name], is a 59 y.o. male, DOB - [birth year], QMG:[id number]  Admit date - 03/26/2020   Admitting Physician [name], MD  Outpatient Primary MD for the patient is [name], MD  LOS - 1  Chief Complaint  Patient presents with  . Wound Infection       Brief Narrative  Brian [name] is a 59 y.o. male with medical history significant of CVA; HTN; DM; cryptogenic cirrhosis; and ESRD on HD presenting with a toe infection, work-up suggested right foot osteomyelitis, podiatry was consulted along with nephrology and he was admitted to the hospital.   Subjective:    Brian [name] today has, No headache, No chest pain, No abdominal pain - No Nausea, No new weakness tingling or numbness, No Cough - SOB.   Assessment  & Plan :     1.  Right foot infection with right fifth metatarsal head ulceration, abscess along with possible right fifth proximal phalanx and fifth toe osteomyelitis.  Currently on empiric antibiotics, inflammatory markers elevated, afebrile, podiatry on board and taking him for partial foot amputation on 03/27/2020.  Will order type screen as well.  2.  ESRD.  On MWF schedule.  Renal on board.  3.  Ongoing tobacco abuse.  Counseled to quit.  4.  Essential hypertension on Norvasc added hydralazine as needed.  5.  DM type II with Karlene Lineman.  Sliding scale for now.  Lab Results  Component Value Date   HGBA1C 6.5 (H) 03/26/2020   CBG (last 3)  Recent Labs    03/26/20 2143 03/27/20 0740 03/27/20 1033  GLUCAP 214* 139* 137*      Condition - Fair  Family Communication  :  None  Code Status :  Full  Consults  :  Renal, Podiatry  Procedures  :   MRI R foot -  1. Area of ulceration on the dorsal  surface of the fifth metatarsal head with a loculated fluid collection measuring 1.4 x 0.7 x 1.5 cm, consistent with subcutaneous abscess. 2. Findings suggestive of early osteomyelitis involving the fifth proximal phalanx. 3. Probable reactive marrow seen throughout the fifth metatarsal head, distal fifth phalanx and fourth phalanges.   PUD Prophylaxis : PPI  Disposition Plan  :    Status is: Inpatient    Dispo: The patient is from: Home              Anticipated d/c is to: Home              Anticipated d/c date is: > 3 days              Patient currently is not medically stable to d/c.   DVT Prophylaxis  :   Heparin    Lab Results  Component Value Date   PLT 322 03/27/2020    Diet :  Diet Order            Diet NPO time specified  Diet effective midnight                  Inpatient Medications Scheduled Meds: . [MAR Hold] amLODipine  5 mg Oral Daily  . [MAR Hold] Chlorhexidine Gluconate Cloth  6 each Topical Q0600  . [MAR Hold] docusate sodium  100 mg Oral BID  . [MAR Hold] doxercalciferol  5 mcg Oral Q M,W,F-HD  . [MAR Hold] ferric citrate  420 mg Oral TID WC  . [MAR Hold] heparin  5,000 Units Subcutaneous Q8H  . [MAR Hold] insulin aspart  0-6 Units Subcutaneous TID WC  . [MAR Hold] nicotine  14 mg Transdermal Daily  . [MAR Hold] pantoprazole  20 mg Oral Daily   Continuous Infusions: . sodium chloride 10 mL/hr at 03/27/20 1119  . [MAR Hold] cefTRIAXone (ROCEPHIN)  IV 2 g (03/27/20 0958)  . [MAR Hold] metronidazole 500 mg (03/27/20 0300)  . [MAR Hold] vancomycin     PRN Meds:.[MAR Hold] acetaminophen **OR** [MAR Hold] acetaminophen, hydrALAZINE, [MAR Hold] ondansetron **OR** [MAR Hold] ondansetron (ZOFRAN) IV  Antibiotics  :   Anti-infectives (From admission, onward)   Start     Dose/Rate Route Frequency Ordered Stop   03/27/20 1200  [MAR Hold]  vancomycin (VANCOCIN) IVPB 1000 mg/200 mL premix     Discontinue     (MAR Hold since Mon 03/27/2020 at 1054.Hold  Reason: Transfer to a Procedural area.)   1,000 mg 200 mL/hr over 60 Minutes Intravenous Every M-W-F (Hemodialysis) 03/26/20 1047     03/26/20 1045  [MAR Hold]  cefTRIAXone (ROCEPHIN) 2 g in sodium chloride 0.9 % 100 mL IVPB     Discontinue     (MAR Hold since Mon 03/27/2020 at 1054.Hold Reason: Transfer to a Procedural area.)   2 g 200 mL/hr over 30 Minutes Intravenous Every 24 hours 03/26/20 1036     03/26/20 1045  [MAR Hold]  metroNIDAZOLE (FLAGYL) IVPB 500 mg     Discontinue     (MAR Hold since Mon 03/27/2020 at 1054.Hold Reason: Transfer to a Procedural area.)   500 mg 100 mL/hr over 60 Minutes Intravenous Every 8 hours 03/26/20 1036     03/26/20 0900  vancomycin (VANCOREADY) IVPB 1750 mg/350 mL        1,750 mg 175 mL/hr over 120 Minutes Intravenous  Once 03/26/20 0852 03/26/20 1450   03/26/20 0900  ceFEPIme (MAXIPIME) 2 g in sodium chloride 0.9 % 100 mL IVPB  Status:  Discontinued        2 g 200 mL/hr over 30 Minutes Intravenous Every 24 hours 03/26/20 0852 03/26/20 0854   03/26/20 0900  ceFEPIme (MAXIPIME) 1 g in sodium chloride 0.9 % 100 mL IVPB  Status:  Discontinued        1 g 200  mL/hr over 30 Minutes Intravenous Every 24 hours 03/26/20 0854 03/26/20 1036   03/26/20 0845  vancomycin (VANCOCIN) IVPB 1000 mg/200 mL premix  Status:  Discontinued        1,000 mg 200 mL/hr over 60 Minutes Intravenous  Once 03/26/20 0840 03/26/20 0852   03/26/20 0845  aztreonam (AZACTAM) 2 g in sodium chloride 0.9 % 100 mL IVPB  Status:  Discontinued        2 g 200 mL/hr over 30 Minutes Intravenous  Once 03/26/20 0840 03/26/20 0852          Objective:   Vitals:   03/26/20 1745 03/26/20 1753 03/26/20 2146 03/27/20 0609  BP:  (!) 151/95 135/78 (!) 150/84  Pulse:  80 84 83  Resp:  19 18 18   Temp:  98.1 F (36.7 C) 99.2 F (37.3 C) 98.3 F (36.8 C)  TempSrc:  Oral Oral Oral  SpO2:  96% 97% 98%  Weight: 83.7 kg     Height: 5\' 9"  (1.753 m)       SpO2: 98 %  Wt Readings from Last 3  Encounters:  03/26/20 83.7 kg  03/07/20 84.8 kg  02/22/20 84.8 kg     Intake/Output Summary (Last 24 hours) at 03/27/2020 1130 Last data filed at 03/27/2020 0658 Gross per 24 hour  Intake 909.37 ml  Output 150 ml  Net 759.37 ml     Physical Exam  Awake Alert, No new F.N deficits, Normal affect [place].AT,PERRAL Supple Neck,No JVD, No cervical lymphadenopathy appriciated.  Symmetrical Chest wall movement, Good air movement bilaterally, CTAB RRR,No Gallops,Rubs or new Murmurs, No Parasternal Heave +ve B.Sounds, Abd Soft, No tenderness, No organomegaly appriciated, No rebound - guarding or rigidity. No Cyanosis, Clubbing or edema, right foot under bandage,     Data Review:    Recent Labs  Lab 03/26/20 0105 03/27/20 0245  WBC 20.0* 15.1*  HGB 13.1 11.8*  HCT 39.5 35.3*  PLT 333 322  MCV 102.9* 100.6*  MCH 34.1* 33.6  MCHC 33.2 33.4  RDW 12.3 11.9    Recent Labs  Lab 03/26/20 0105 03/26/20 0905 03/26/20 1138 03/27/20 0245  NA 125*  --   --  129*  K 3.8  --   --  3.8  CL 87*  --   --  90*  CO2 23  --   --  24  GLUCOSE 145*  --   --  125*  BUN 42*  --   --  62*  CREATININE 6.97*  --   --  9.05*  CALCIUM 8.9  --   --  8.9  AST  --  37  --   --   ALT  --  22  --   --   ALKPHOS  --  129*  --   --   BILITOT  --  2.0*  --   --   ALBUMIN  --  3.0*  --   --   CRP  --   --  12.4*  --   INR  --  1.0  --   --   HGBA1C  --   --  6.5*  --     Recent Labs  Lab 03/26/20 1138  CRP 12.4*  SARSCOV2NAA NEGATIVE    ------------------------------------------------------------------------------------------------------------------ No results for input(s): CHOL, HDL, LDLCALC, TRIG, CHOLHDL, LDLDIRECT in the last 72 hours.  Lab Results  Component Value Date   HGBA1C 6.5 (H) 03/26/2020   ------------------------------------------------------------------------------------------------------------------ No results for input(s): TSH, T4TOTAL, T3FREE, THYROIDAB  in the last 72  hours.  Invalid input(s): FREET3 ------------------------------------------------------------------------------------------------------------------ No results for input(s): VITAMINB12, FOLATE, FERRITIN, TIBC, IRON, RETICCTPCT in the last 72 hours.  Coagulation profile Recent Labs  Lab 03/26/20 0905  INR 1.0    No results for input(s): DDIMER in the last 72 hours.  Cardiac Enzymes No results for input(s): CKMB, TROPONINI, MYOGLOBIN in the last 168 hours.  Invalid input(s): CK ------------------------------------------------------------------------------------------------------------------    Component Value Date/Time   BNP 800.3 (H) 02/04/2017 2310    Micro Results Recent Results (from the past 240 hour(s))  Blood Culture (routine x 2)     Status: None (Preliminary result)   Collection Time: 03/26/20  8:48 AM   Specimen: BLOOD RIGHT HAND  Result Value Ref Range Status   Specimen Description BLOOD RIGHT HAND  Final   Special Requests   Final    BOTTLES DRAWN AEROBIC AND ANAEROBIC Blood Culture adequate volume   Culture   Final    NO GROWTH < 24 HOURS Performed at [facility], [address] [place], [place] [address]    Report Status PENDING  Incomplete  SARS Coronavirus 2 by RT PCR (hospital order, performed in [facility] hospital lab) Nasopharyngeal Nasopharyngeal Swab     Status: None   Collection Time: 03/26/20 11:38 AM   Specimen: Nasopharyngeal Swab  Result Value Ref Range Status   SARS Coronavirus 2 NEGATIVE NEGATIVE Final    Comment: (NOTE) SARS-CoV-2 target nucleic acids are NOT DETECTED.  The SARS-CoV-2 RNA is generally detectable in upper and lower respiratory specimens during the acute phase of infection. The lowest concentration of SARS-CoV-2 viral copies this assay can detect is 250 copies / mL. A negative result does not preclude SARS-CoV-2 infection and should not be used as the sole basis for treatment or other patient management decisions.   A negative result may occur with improper specimen collection / handling, submission of specimen other than nasopharyngeal swab, presence of viral mutation(s) within the areas targeted by this assay, and inadequate number of viral copies (<250 copies / mL). A negative result must be combined with clinical observations, patient history, and epidemiological information.  Fact Sheet for Patients:   StrictlyIdeas.no  Fact Sheet for Healthcare Providers: BankingDealers.co.za  This test is not yet approved or  cleared by the [place] FDA and has been authorized for detection and/or diagnosis of SARS-CoV-2 by FDA under an Emergency Use Authorization (EUA).  This EUA will remain in effect (meaning this test can be used) for the duration of the COVID-19 declaration under Section 564(b)(1) of the Act, 21 U.S.C. section 360bbb-3(b)(1), unless the authorization is terminated or revoked sooner.  Performed at [facility], [place] [address], [place], [place] [address]   Surgical PCR screen     Status: None   Collection Time: 03/26/20  5:43 PM   Specimen: Nasal Mucosa; Nasal Swab  Result Value Ref Range Status   MRSA, PCR NEGATIVE NEGATIVE Final   Staphylococcus aureus NEGATIVE NEGATIVE Final    Comment: (NOTE) The Xpert SA Assay (FDA approved for NASAL specimens in patients 48 years of age and older), is one component of a comprehensive surveillance program. It is not intended to diagnose infection nor to guide or monitor treatment. Performed at [facility], [place] [address] [place], [place] [address]   Blood Culture (routine x 2)     Status: None (Preliminary result)   Collection Time: 03/26/20  6:44 PM   Specimen: BLOOD RIGHT HAND  Result Value Ref Range Status   Specimen Description BLOOD RIGHT HAND  Final   Special Requests   Final    BOTTLES DRAWN AEROBIC ONLY Blood Culture adequate volume   Culture   Final    NO GROWTH  < 12 HOURS Performed at [facility], [address], [place], [place] [address]    Report Status PENDING  Incomplete    Radiology Reports DG Ankle Complete Right  Result Date: 03/26/2020 CLINICAL DATA:  Diabetes with neuropathy. Evaluate for osteomyelitis. EXAM: RIGHT ANKLE - COMPLETE 3+ VIEW COMPARISON:  None. FINDINGS: Ankle mortise is normal. Mild diffuse decreased bone mineralization. Moderate degenerative changes over the midfoot and hindfoot as well as the tibiotalar joint. No evidence of bone destruction to suggest osteomyelitis. Spurring over the posterior and inferior calcaneus. IMPRESSION: No acute findings. Electronically Signed   By: Marin Olp M.D.   On: 03/26/2020 10:49   MR FOOT RIGHT WO CONTRAST  Result Date: 03/27/2020 CLINICAL DATA:  Open wound of the fifth toe, question of osteomyelitis EXAM: MRI OF THE RIGHT FOREFOOT WITHOUT CONTRAST TECHNIQUE: Multiplanar, multisequence MR imaging of the right was performed. No intravenous contrast was administered. COMPARISON:  None. FINDINGS: Bones/Joint/Cartilage There is diffusely increased T2 signal with subtle T1 hypointensity on the plantar surface of the fifth proximal phalanx within overlying a area of ulceration. There is also increased T2 hyperintense signal without T1 hypointensity within the fifth metatarsal head, distal fifth phalanx, and fourth phalanges. Mild midfoot osteoarthritis seen with joint space loss and subchondral cystic changes. First MTP joint osteoarthritis is seen. Ligaments The Lisfranc ligaments appear to be intact. Muscles and Tendons Diffuse fatty atrophy seen within the muscles surrounding the forefoot. The flexor and extensor tendons appear to be intact increased heterogeneous signal seen within the abductor digiti minimi tendon. Soft tissues There is a focal area of ulceration overlying the dorsal surface of the fifth metatarsal head with a loculated fluid collection measuring 1.4 x 0.7 x 1.5 cm. There  is diffusely increased dorsal subcutaneous edema noted. IMPRESSION: 1. Area of ulceration on the dorsal surface of the fifth metatarsal head with a loculated fluid collection measuring 1.4 x 0.7 x 1.5 cm, consistent with subcutaneous abscess. 2. Findings suggestive of early osteomyelitis involving the fifth proximal phalanx. 3. Probable reactive marrow seen throughout the fifth metatarsal head, distal fifth phalanx and fourth phalanges. Electronically Signed   By: Prudencio Pair M.D.   On: 03/27/2020 00:58   DG Foot Complete Right  Result Date: 03/26/2020 CLINICAL DATA:  History of diabetes and neuropathy. Open wound between fourth and fifth toes 1 week. Evaluate for osteomyelitis. EXAM: RIGHT FOOT COMPLETE - 3+ VIEW COMPARISON:  None. FINDINGS: Soft tissue wound over the plantar surface of the forefoot adjacent the fifth MTP joint. No significant air in the soft tissues. Slight lucency over the head of the fifth metatarsal on the AP view without discrete bone destruction. No other areas of bone destruction to suggest osteomyelitis. There is moderate degenerative change over the first MTP joint and midfoot/hindfoot. Spurring over the posterior and inferior calcaneus. IMPRESSION: 1. Soft tissue wound over the plantar aspect of the forefoot adjacent the fifth MTP joint. Subtle lucency over the head of the fifth metatarsal mildly suspicious for osteomyelitis. No discrete bone destruction. 2.  Moderate degenerative changes as described Electronically Signed   By: Marin Olp M.D.   On: 03/26/2020 10:41   DG Toe 5th Right  Result Date: 03/26/2020 CLINICAL DATA:  Toe infection  EXAM: RIGHT FIFTH TOE COMPARISON:  None. FINDINGS: There is question of periosteal reaction seen with cortical step-off at the base of the distal fifth phalanx. Diffuse overlying soft tissue swelling with subcutaneous emphysema is seen. IMPRESSION: Cortical irregularity and periosteal reaction of the fifth distal phalanx which could be due  to osteomyelitis. Overlying soft tissue swelling and subcutaneous emphysema. Electronically Signed   By: Prudencio Pair M.D.   On: 03/26/2020 01:38    Time Spent in minutes  30   Lala Lund M.D on 03/27/2020 at 11:30 AM  To page go to [email or web address] - password [facility]

## 2020-03-27 NOTE — Progress Notes (Addendum)
[facility] Progress Note   Subjective:   Pt seen in room. Planned for amputation today. Denies SOB, CP, dizziness, palpitations, abdominal pain. Has had some nausea this week which he thinks is due to foot infection.  Seen after amputation -  pain well controlled - sitting up - waiting for food tray  Objective Vitals:   03/26/20 1745 03/26/20 1753 03/26/20 2146 03/27/20 0609  BP:  (!) 151/95 135/78 (!) 150/84  Pulse:  80 84 83  Resp:  19 18 18   Temp:  98.1 F (36.7 C) 99.2 F (37.3 C) 98.3 F (36.8 C)  TempSrc:  Oral Oral Oral  SpO2:  96% 97% 98%  Weight: 83.7 kg     Height: 5\' 9"  (1.753 m)      Physical Exam General: Well developed male, alert and in NAD Heart: RRR, no murmurs, rubs or gallops Lungs: CTA b/l without wheezing, rhonchi or rales Abdomen: Soft, non-tender, non-distended. +BS Extremities: No edema b/l lower extremities Dialysis Access: LUE AVF + t/b  Additional Objective Labs: Basic Metabolic Panel: Recent Labs  Lab 03/26/20 0105 03/27/20 0245  NA 125* 129*  K 3.8 3.8  CL 87* 90*  CO2 23 24  GLUCOSE 145* 125*  BUN 42* 62*  CREATININE 6.97* 9.05*  CALCIUM 8.9 8.9   Liver Function Tests: Recent Labs  Lab 03/26/20 0905  AST 37  ALT 22  ALKPHOS 129*  BILITOT 2.0*  PROT 6.8  ALBUMIN 3.0*   CBC: Recent Labs  Lab 03/26/20 0105 03/27/20 0245  WBC 20.0* 15.1*  HGB 13.1 11.8*  HCT 39.5 35.3*  MCV 102.9* 100.6*  PLT 333 322   Blood Culture    Component Value Date/Time   SDES BLOOD RIGHT HAND 03/26/2020 1844   SPECREQUEST  03/26/2020 1844    BOTTLES DRAWN AEROBIC ONLY Blood Culture adequate volume   CULT  03/26/2020 1844    NO GROWTH < 12 HOURS Performed at [facility], [place] [address] [place], [place] [address]    REPTSTATUS PENDING 03/26/2020 1844    CBG: Recent Labs  Lab 03/26/20 1320 03/26/20 1750 03/26/20 2143 03/27/20 0740  GLUCAP 114* 136* 214* 139*    Studies/Results: DG Ankle Complete  Right  Result Date: 03/26/2020 CLINICAL DATA:  Diabetes with neuropathy. Evaluate for osteomyelitis. EXAM: RIGHT ANKLE - COMPLETE 3+ VIEW COMPARISON:  None. FINDINGS: Ankle mortise is normal. Mild diffuse decreased bone mineralization. Moderate degenerative changes over the midfoot and hindfoot as well as the tibiotalar joint. No evidence of bone destruction to suggest osteomyelitis. Spurring over the posterior and inferior calcaneus. IMPRESSION: No acute findings. Electronically Signed   By: Marin Olp M.D.   On: 03/26/2020 10:49   MR FOOT RIGHT WO CONTRAST  Result Date: 03/27/2020 CLINICAL DATA:  Open wound of the fifth toe, question of osteomyelitis EXAM: MRI OF THE RIGHT FOREFOOT WITHOUT CONTRAST TECHNIQUE: Multiplanar, multisequence MR imaging of the right was performed. No intravenous contrast was administered. COMPARISON:  None. FINDINGS: Bones/Joint/Cartilage There is diffusely increased T2 signal with subtle T1 hypointensity on the plantar surface of the fifth proximal phalanx within overlying a area of ulceration. There is also increased T2 hyperintense signal without T1 hypointensity within the fifth metatarsal head, distal fifth phalanx, and fourth phalanges. Mild midfoot osteoarthritis seen with joint space loss and subchondral cystic changes. First MTP joint osteoarthritis is seen. Ligaments The Lisfranc ligaments appear to be intact. Muscles and Tendons Diffuse fatty atrophy seen within the muscles surrounding the forefoot. The  flexor and extensor tendons appear to be intact increased heterogeneous signal seen within the abductor digiti minimi tendon. Soft tissues There is a focal area of ulceration overlying the dorsal surface of the fifth metatarsal head with a loculated fluid collection measuring 1.4 x 0.7 x 1.5 cm. There is diffusely increased dorsal subcutaneous edema noted. IMPRESSION: 1. Area of ulceration on the dorsal surface of the fifth metatarsal head with a loculated fluid  collection measuring 1.4 x 0.7 x 1.5 cm, consistent with subcutaneous abscess. 2. Findings suggestive of early osteomyelitis involving the fifth proximal phalanx. 3. Probable reactive marrow seen throughout the fifth metatarsal head, distal fifth phalanx and fourth phalanges. Electronically Signed   By: Prudencio Pair M.D.   On: 03/27/2020 00:58   DG Foot Complete Right  Result Date: 03/26/2020 CLINICAL DATA:  History of diabetes and neuropathy. Open wound between fourth and fifth toes 1 week. Evaluate for osteomyelitis. EXAM: RIGHT FOOT COMPLETE - 3+ VIEW COMPARISON:  None. FINDINGS: Soft tissue wound over the plantar surface of the forefoot adjacent the fifth MTP joint. No significant air in the soft tissues. Slight lucency over the head of the fifth metatarsal on the AP view without discrete bone destruction. No other areas of bone destruction to suggest osteomyelitis. There is moderate degenerative change over the first MTP joint and midfoot/hindfoot. Spurring over the posterior and inferior calcaneus. IMPRESSION: 1. Soft tissue wound over the plantar aspect of the forefoot adjacent the fifth MTP joint. Subtle lucency over the head of the fifth metatarsal mildly suspicious for osteomyelitis. No discrete bone destruction. 2.  Moderate degenerative changes as described Electronically Signed   By: Marin Olp M.D.   On: 03/26/2020 10:41   DG Toe 5th Right  Result Date: 03/26/2020 CLINICAL DATA:  Toe infection EXAM: RIGHT FIFTH TOE COMPARISON:  None. FINDINGS: There is question of periosteal reaction seen with cortical step-off at the base of the distal fifth phalanx. Diffuse overlying soft tissue swelling with subcutaneous emphysema is seen. IMPRESSION: Cortical irregularity and periosteal reaction of the fifth distal phalanx which could be due to osteomyelitis. Overlying soft tissue swelling and subcutaneous emphysema. Electronically Signed   By: Prudencio Pair M.D.   On: 03/26/2020 01:38   Medications: .  cefTRIAXone (ROCEPHIN)  IV 2 g (03/27/20 0958)  . metronidazole 500 mg (03/27/20 0300)  . vancomycin     . amLODipine  5 mg Oral Daily  . Chlorhexidine Gluconate Cloth  6 each Topical Q0600  . docusate sodium  100 mg Oral BID  . ferric citrate  420 mg Oral TID WC  . heparin  5,000 Units Subcutaneous Q8H  . insulin aspart  0-6 Units Subcutaneous TID WC  . nicotine  14 mg Transdermal Daily  . pantoprazole  20 mg Oral Daily    Dialysis Orders: [facility] MWF   4h  400/800  82kg 2/2 bath  AVF L   Hep 3000  - hect 5  - no esa  Assessment/Plan: 1. R diabetic foot infection/osteomyelitis: Seen by podiatry, planned for amputation today and debridement later this week. On IV rocephin, flagyl and vancomycin 2. ESRD: MWF schedule. K+ controlled. No volume overload or uremic sx on exam. Noted to have hyponatremia with Na 129 which is improved, expect further improvement after HD. No urgent indication for HD, will plan around surgery schedule. Should be later today  3. HTN/volume:  BP elevated today. Continue amlodipine. UF goal 3L with HD today.  4. Anemia: Hgb at goal. No ESA/Fe indicated.  5. Secondary hyperparathyroidism:  Calcium at goal. Continue hectorol. Resume binder  (auryxia)once no longer NPO 6. Nutrition:  Renal diet/fluid restrictions once tolerating PO 7. T2DM: Insulin per primary  Anice Paganini, PA-C 03/27/2020, 10:13 AM  [facility] Pager: [phone]  Patient seen and examined, agree with above note with above modifications. S/p ray amputation per podiatry-  HD today on schedule via AVF.  Really no other issues Corliss Parish, MD 03/27/2020

## 2020-03-27 NOTE — Brief Op Note (Signed)
03/27/2020  1:10 PM  PATIENT:  Brian [name]  59 y.o. male  PRE-OPERATIVE DIAGNOSIS:  infection  POST-OPERATIVE DIAGNOSIS:  infection  PROCEDURE:  Procedure(s): AMPUTATION RAY 5th (Right)  SURGEON:  Surgeon(s) and Role:    * Trula Slade, DPM - Primary  PHYSICIAN ASSISTANT:   ASSISTANTS: none   ANESTHESIA:   MAC  EBL:  20 mL   BLOOD ADMINISTERED:none  DRAINS: none   LOCAL MEDICATIONS USED:  OTHER 20cc lidocaine and marcaine plain   SPECIMEN:  Bone for pathology and wound culture   DISPOSITION OF SPECIMEN:  PATHOLOGY  COUNTS:  YES  TOURNIQUET:  * Missing tourniquet times found for documented tourniquets in log: [id number] *  DICTATION: .Dragon Dictation  PLAN OF CARE: Admit to inpatient   PATIENT DISPOSITION:  PACU - hemodynamically stable.   Delay start of Pharmacological VTE agent (>24hrs) due to surgical blood loss or risk of bleeding: no  Intraoperative finding: Abscess submet 4/5 area. After debridement no further purulence noted. Wound culture obtained. Bone sent to pathology. Wound lightly closed but packed with saline 4x4 gauze. Will plan for likely return to OR possibly Thursday for debridement and hopeful delayed primary closure. Discussed he may need 4th toe amputation but will monitor.

## 2020-03-27 NOTE — Progress Notes (Signed)
Initial Nutrition Assessment  RD working remotely.  DOCUMENTATION CODES:   Not applicable  INTERVENTION:   - Once diet advanced, Nepro Shake po BID, each supplement provides 425 kcal and 19 grams protein  - Once diet advanced, Pro-stat 30 ml po BID, each supplement provides 100 kcal and 15 grams of protein  - Renal MVI daily  - Encourage adequate PO intake  NUTRITION DIAGNOSIS:   Increased nutrient needs related to wound healing as evidenced by estimated needs.  GOAL:   Patient will meet greater than or equal to 90% of their needs  MONITOR:   PO intake, Supplement acceptance, Labs, Weight trends, Skin  REASON FOR ASSESSMENT:   Consult Wound healing  ASSESSMENT:   59 year old male who presented on 6/20 with right diabetic foot wound. PMH of DM, HTN, CVA, HLD, ESRD on HD, cryptogenic cirrhosis.   Pt in OR for right fifth ray amputation. Per Podiatry note, pt will likely return to the OR on Wednesday or Thursday for debridement as well as wound closure.  RD was unable to reach pt via phone call to room as pt was in the OR. Per notes, pt has been experiencing some nausea and "eating light." Pt attributes nausea to wound infection.  EDW: 82 kg  Weight fairly stable between 83-85 kg over the last year.  RD will order supplements for pt once diet advanced.  Medications reviewed and include: colace, hectorol, ferric citrate TID with meals, SSI, protonix, IV abx IVF: NS @ 10 ml/hr  Labs reviewed: sodium 129, hemoglobin A1C 6.5, prealbumin 6.8 CBG's: 114-214 x 24 hours  NUTRITION - FOCUSED PHYSICAL EXAM:  Unable to complete at this time. RD working remotely.  Diet Order:   Diet Order            Diet NPO time specified  Diet effective midnight                 EDUCATION NEEDS:   Not appropriate for education at this time  Skin:  Skin Assessment: Skin Integrity Issues: Diabetic Ulcer: right foot  Last BM:  03/26/20  Height:   Ht Readings from Last 1  Encounters:  03/26/20 5\' 9"  (1.753 m)    Weight:   Wt Readings from Last 1 Encounters:  03/26/20 83.7 kg    Ideal Body Weight:  72.7 kg  BMI:  Body mass index is 27.26 kg/m.  Estimated Nutritional Needs:   Kcal:  2200-2400  Protein:  110-130 grams  Fluid:  >/= 2.0 L    Gaynell Face, MS, RD, LDN Inpatient Clinical Dietitian Pager: [phone] Weekend/After Hours: [phone]

## 2020-03-27 NOTE — Progress Notes (Signed)
Seen in pre-op for surgery today. WBC improved but still elevated. Currently afebrile. Reviewed the MRI results. Will plan for partial 5th ray amputation as scheduled. Discussed possible need for 4th toe but will monitor and see intraoperative findings. Discussed with him today and yesterday likely return to the OR later this week for possible wound closure pending clinical improvement. Sister was at bedside and no further questions. NPO. Surgical consent signed.

## 2020-03-27 NOTE — Progress Notes (Signed)
Orthopedic Tech Progress Note Patient Details:  Brian [name] 10-Mar-1961 [id number] Left Darco Shoe bedside for patient. Ortho Devices Type of Ortho Device: Darco shoe Ortho Device/Splint Location: LRE Ortho Device/Splint Interventions: Application, Ordered   Post Interventions Patient Tolerated: Well Instructions Provided: Care of device   Harlin Mazzoni A Tajana Crotteau 03/27/2020, 2:28 PM

## 2020-03-27 NOTE — Transfer of Care (Signed)
Immediate Anesthesia Transfer of Care Note  Patient: Brian [name]  Procedure(s) Performed: AMPUTATION RAY 5th (Right )  Patient Location: PACU  Anesthesia Type:General  Level of Consciousness: drowsy, patient cooperative and responds to stimulation  Airway & Oxygen Therapy: Patient Spontanous Breathing  Post-op Assessment: Report given to RN and Post -op Vital signs reviewed and stable  Post vital signs: Reviewed and stable  Last Vitals:  Vitals Value Taken Time  BP 132/71 03/27/20 1311  Temp    Pulse 80 03/27/20 1312  Resp 20 03/27/20 1312  SpO2 98 % 03/27/20 1312  Vitals shown include unvalidated device data.  Last Pain:  Vitals:   03/27/20 0609  TempSrc: Oral  PainSc:          Complications: No complications documented.

## 2020-03-27 NOTE — Op Note (Signed)
PATIENT:  Brian [name]  59 y.o. male  PRE-OPERATIVE DIAGNOSIS:  infection  POST-OPERATIVE DIAGNOSIS:  infection  PROCEDURE:  Procedure(s): AMPUTATION RAY 5th (Right)  SURGEON:  Surgeon(s) and Role:    * Trula Slade, DPM - Primary  PHYSICIAN ASSISTANT:   ASSISTANTS: none   ANESTHESIA:   MAC  EBL:  20 mL   BLOOD ADMINISTERED:none  DRAINS: none   LOCAL MEDICATIONS USED:  OTHER 20cc lidocaine and marcaine plain   SPECIMEN:  Bone for pathology and wound culture   DISPOSITION OF SPECIMEN:  PATHOLOGY  COUNTS:  YES  TOURNIQUET:  Tourniquet applied but not inflated during the procedure.  Anatomical dissection.  DICTATION: IT trainer Dictation  PLAN OF CARE: Admit to inpatient   PATIENT DISPOSITION:  PACU - hemodynamically stable.   Delay start of Pharmacological VTE agent (>24hrs) due to surgical blood loss or risk of bleeding: no  Indications for surgery: Patient is presenting treated for right side shortness of ulceration which has been chronic.  However last week he came to the office to see Dr. Posey Pronto for worsening infection.  They discussed admission to the hospital that point but they wanted to try oral antibiotics.  Saturday evening he states that cellulitis has progressed and the wound worsened to have malodor for which he presented to the emergency room on Sunday.  White blood cell count elevated.  X-rays and MRI concerning for osteomyelitis and abscess.  Given the infection recommend partial fifth ray amputation.  Discussed the surgical postoperative course.  Alternatives, risks, complications were discussed in detail.  Discussed with him likely return to operating room later this week for possible wound closure based on clinical improvement.  Also will need to monitor the fourth toe closely for possible amputation of that toe given MRI results.  I discussed the labs with the patient and his sister in detail.  Procedure in detail: The patient was  both verbally and visually identified by myself, nursing staff, the anesthesia staff preoperatively.  Transferred to the operating room via stretcher and placed on the operating table in supine position.  Tourniquet was applied but this was not inflated during the procedure.  Timeout was performed and 10 cc of lidocaine, Marcaine plain was infiltrated in a regional block fashion.  The right lower extremity was then scrubbed, prepped, draped in normal sterile fashion.  A  secondary timeout was performed.  A racquet shaped incision was planned around the fifth MPJ to the fifth metatarsal.  Initial incision was made with a #15 scalpel from skin to bone and the toe was disarticulated at the level of the MPJ and passed off the table.  There was found to be abscess noted submetatarsal 4, 5 area.  This was cultured at this time.  Incision was carried proximally on the fifth metatarsal and soft tissue structures were freed from the distal third metatarsal.  Sagittal saw was utilized to resect the head and neck of the fifth metatarsal.  The bone was sent to pathology.  In regards to the abscess I utilized a 15 scalpel as well as a rongeur to remove all nonviable tissue.  This did seem to go up to the level of the fascia submetatarsal 4.  After debrided all nonviable tissue there is bleeding, healthy tissue present.  Wound was copiously irrigated with saline and hemostasis was achieved.  An additional 10 cc lidocaine, Marcaine plain was infiltrated in a regional block fashion.  I placed 2 single sutures and the wound was packed open  with saline wet-to-dry 4 x 4 gauze.  4 x 4's, ABD, Kerlix, Ace was then applied.  He was woken from anesthesia and found to tolerate the procedure well any complications.  He was transferred to PACU with vital signs stable and vascular status intact of any issues.  He will remain inpatient on IV antibiotics and await clinical response.  Possible return to the operating room on Thursday.   Monitor clinical response.  Discussed intraoperative findings with the patient's sister as well as the patient.

## 2020-03-27 NOTE — Anesthesia Preprocedure Evaluation (Addendum)
Anesthesia Evaluation  Patient identified by MRN, date of birth, ID band Patient awake    Reviewed: Allergy & Precautions, NPO status , Patient's Chart, lab work & pertinent test results  History of Anesthesia Complications Negative for: history of anesthetic complications  Airway Mallampati: II  TM Distance: >3 FB Neck ROM: Full    Dental  (+) Poor Dentition, Dental Advisory Given   Pulmonary Current Smoker,    Pulmonary exam normal        Cardiovascular hypertension, Pt. on medications Normal cardiovascular exam     Neuro/Psych CVA    GI/Hepatic Neg liver ROS, PUD,   Endo/Other  diabetes, Type 2, Insulin Dependent  Renal/GU ESRF and DialysisRenal disease     Musculoskeletal  (+) Arthritis ,   Abdominal   Peds  Hematology negative hematology ROS (+)   Anesthesia Other Findings   Reproductive/Obstetrics                           Lab Results  Component Value Date   WBC 15.1 (H) 03/27/2020   HGB 11.8 (L) 03/27/2020   HCT 35.3 (L) 03/27/2020   MCV 100.6 (H) 03/27/2020   PLT 322 03/27/2020   Lab Results  Component Value Date   CREATININE 9.05 (H) 03/27/2020   BUN 62 (H) 03/27/2020   NA 129 (L) 03/27/2020   K 3.8 03/27/2020   CL 90 (L) 03/27/2020   CO2 24 03/27/2020    Anesthesia Physical  Anesthesia Plan  ASA: III  Anesthesia Plan: MAC   Post-op Pain Management:    Induction: Intravenous  PONV Risk Score and Plan: 0 and Treatment may vary due to age or medical condition, Propofol infusion and Ondansetron  Airway Management Planned: Natural Airway and Nasal Cannula  Additional Equipment:   Intra-op Plan:   Post-operative Plan:   Informed Consent: I have reviewed the patients History and Physical, chart, labs and discussed the procedure including the risks, benefits and alternatives for the proposed anesthesia with the patient or authorized representative who has  indicated his/her understanding and acceptance.     Dental advisory given  Plan Discussed with: Anesthesiologist and CRNA  Anesthesia Plan Comments:        Anesthesia Quick Evaluation

## 2020-03-27 NOTE — Anesthesia Postprocedure Evaluation (Signed)
Anesthesia Post Note  Patient: Brian [name]  Procedure(s) Performed: AMPUTATION RAY 5th (Right )     Patient location during evaluation: PACU Anesthesia Type: MAC Level of consciousness: awake and alert Pain management: pain level controlled Vital Signs Assessment: post-procedure vital signs reviewed and stable Respiratory status: spontaneous breathing and respiratory function stable Cardiovascular status: stable Postop Assessment: no apparent nausea or vomiting Anesthetic complications: no   No complications documented.  Last Vitals:  Vitals:   03/27/20 1312 03/27/20 1325  BP: 132/71 (!) 152/76  Pulse: 84 79  Resp: 16 20  Temp: 36.6 C 36.6 C  SpO2:  98%    Last Pain:  Vitals:   03/27/20 1325  TempSrc:   PainSc: 0-No pain                 Natsumi Whitsitt DANIEL

## 2020-03-27 NOTE — Progress Notes (Addendum)
Patient return from PACU and vital signs stable. Patient right foot dressing clean dry and intact. Pt denies pain. Ortho Tech paged for post op shoe.

## 2020-03-27 NOTE — Anesthesia Procedure Notes (Signed)
Procedure Name: MAC Date/Time: 03/27/2020 12:25 PM Performed by: Janace Litten, CRNA Pre-anesthesia Checklist: Patient identified, Emergency Drugs available, Suction available and Patient being monitored Patient Re-evaluated:Patient Re-evaluated prior to induction Oxygen Delivery Method: Simple face mask

## 2020-03-28 ENCOUNTER — Encounter (HOSPITAL_COMMUNITY): Payer: Self-pay | Admitting: Podiatry

## 2020-03-28 ENCOUNTER — Ambulatory Visit: Payer: Medicare Other | Admitting: Podiatry

## 2020-03-28 LAB — CBC WITH DIFFERENTIAL/PLATELET
Abs Immature Granulocytes: 0.15 10*3/uL — ABNORMAL HIGH (ref 0.00–0.07)
Basophils Absolute: 0.2 10*3/uL — ABNORMAL HIGH (ref 0.0–0.1)
Basophils Relative: 1 %
Eosinophils Absolute: 0.2 10*3/uL (ref 0.0–0.5)
Eosinophils Relative: 2 %
HCT: 36.9 % — ABNORMAL LOW (ref 39.0–52.0)
Hemoglobin: 12.2 g/dL — ABNORMAL LOW (ref 13.0–17.0)
Immature Granulocytes: 1 %
Lymphocytes Relative: 24 %
Lymphs Abs: 2.9 10*3/uL (ref 0.7–4.0)
MCH: 33.8 pg (ref 26.0–34.0)
MCHC: 33.1 g/dL (ref 30.0–36.0)
MCV: 102.2 fL — ABNORMAL HIGH (ref 80.0–100.0)
Monocytes Absolute: 2.5 10*3/uL — ABNORMAL HIGH (ref 0.1–1.0)
Monocytes Relative: 21 %
Neutro Abs: 6.3 10*3/uL (ref 1.7–7.7)
Neutrophils Relative %: 51 %
Platelets: 364 10*3/uL (ref 150–400)
RBC: 3.61 MIL/uL — ABNORMAL LOW (ref 4.22–5.81)
RDW: 12.1 % (ref 11.5–15.5)
WBC: 12.2 10*3/uL — ABNORMAL HIGH (ref 4.0–10.5)
nRBC: 0 % (ref 0.0–0.2)

## 2020-03-28 LAB — GLUCOSE, CAPILLARY
Glucose-Capillary: 152 mg/dL — ABNORMAL HIGH (ref 70–99)
Glucose-Capillary: 195 mg/dL — ABNORMAL HIGH (ref 70–99)
Glucose-Capillary: 192 mg/dL — ABNORMAL HIGH (ref 70–99)
Glucose-Capillary: 238 mg/dL — ABNORMAL HIGH (ref 70–99)

## 2020-03-28 LAB — BASIC METABOLIC PANEL
Anion gap: 12 (ref 5–15)
BUN: 34 mg/dL — ABNORMAL HIGH (ref 6–20)
CO2: 27 mmol/L (ref 22–32)
Calcium: 9 mg/dL (ref 8.9–10.3)
Chloride: 96 mmol/L — ABNORMAL LOW (ref 98–111)
Creatinine, Ser: 6.36 mg/dL — ABNORMAL HIGH (ref 0.61–1.24)
GFR calc Af Amer: 10 mL/min — ABNORMAL LOW (ref >60)
GFR calc non Af Amer: 9 mL/min — ABNORMAL LOW (ref >60)
Glucose, Bld: 170 mg/dL — ABNORMAL HIGH (ref 70–99)
Potassium: 3.7 mmol/L (ref 3.5–5.1)
Sodium: 135 mmol/L (ref 135–145)

## 2020-03-28 LAB — TYPE AND SCREEN
ABO/RH(D): O POS
Antibody Screen: NEGATIVE

## 2020-03-28 LAB — BRAIN NATRIURETIC PEPTIDE: B Natriuretic Peptide: 1261.8 pg/mL — ABNORMAL HIGH (ref 0.0–100.0)

## 2020-03-28 LAB — C-REACTIVE PROTEIN: CRP: 14.8 mg/dL — ABNORMAL HIGH (ref <1.0)

## 2020-03-28 MED ORDER — LOPERAMIDE HCL 2 MG PO CAPS
2.0000 mg | ORAL_CAPSULE | Freq: Four times a day (QID) | ORAL | Status: DC | PRN
  Administered 2020-03-28 – 2020-03-30 (×5): 2 mg via ORAL
  Filled 2020-03-28 (×5): qty 1

## 2020-03-28 MED ORDER — CHLORHEXIDINE GLUCONATE CLOTH 2 % EX PADS
6.0000 | MEDICATED_PAD | Freq: Every day | CUTANEOUS | Status: DC
  Administered 2020-03-29 – 2020-04-02 (×4): 6 via TOPICAL

## 2020-03-28 NOTE — Progress Notes (Signed)
Subjective: POD #1 s/p right partial 5th ray amputation.  States he is doing well not having any pain.  White blood cell count improved today.  Currently afebrile. Denies any systemic complaints such as fevers, chills, nausea, vomiting.   Objective: AAO x3, NAD DP/PT pulses palpable bilaterally, CRT less than 3 seconds to remaining digits S/p partial fifth ray amputation with a suture intact.  The wound was packed and upon removal healthy, bleeding tissue evident there is no purulence.  There is decreased erythema but still present on the dorsal MPJs and to the distal leg but there is decreased wound to the foot.  There is no fluctuation or crepitation.  There is no significant malodor.  Fourth toe with minimal edema but no significant erythema or warmth. No pain with calf compression, swelling, warmth, erythema  Assessment: POD #1 s/p right partial 5th ray amputation  Plan: WBC down to 12.2, Hemoglobin stable. CRP elevated from admission but can also be in response to surgery. Currently afebrile but had temp of 100.3 overnight.  Dressing was changed today.  Irrigated the wound with saline and repacked with a saline moistened 4 x 4 gauze and a dry sterile dressing.  Continue broad-spectrum antibiotics and await culture.  Will likely return the operating room on Thursday for wound debridement and possible delayed primary closure.  Again discussed possible need for fourth toe mutation will monitor clinically.  Currently the fourth toe appears to be stable.  Celesta Gentile, DPM O: [phone] C: [phone]

## 2020-03-28 NOTE — Progress Notes (Signed)
OT Cancellation Note  Patient Details Name: Brian [name] MRN: [id number] DOB: May 24, 1961   Cancelled Treatment:    Reason Eval/Treat Not Completed: PT screened, no needs identified, will sign off.  Patient indicating he does not want/need OT.  He functions at mod I level at baseline and sponge baths at home, planning to continue doing so.  PT indicating today patient performing mod I and having no concerns.  Will complete OT order.  [name], OTR/L   [name] 03/28/2020, 2:44 PM

## 2020-03-28 NOTE — Progress Notes (Addendum)
[facility] Progress Note   Subjective:   S/p 5th ray amputation on 6/21, had HD after surgery with no issues. Denies SOB, CP, palpitations, dizziness, abdominal pain, N/V. Reports diarrhea today.  Seen later in day-  Tells me return to OR will now be on Thursday   Objective Vitals:   03/27/20 1845 03/27/20 2128 03/28/20 0524 03/28/20 0752  BP: (!) 154/92 (!) 140/94 (!) 153/94 (!) 166/87  Pulse: 82 89 84   Resp: 19 18 18    Temp: (!) 97.5 F (36.4 C) 100.3 F (37.9 C) 98.9 F (37.2 C)   TempSrc: Oral Oral Oral   SpO2: 97%  100%   Weight: 81.7 kg     Height:       Physical Exam General: Well developed male, alert and in NAD Heart: RRR, no murmurs, normal S1/S2 Lungs: CTA b/l without wheezing, rhonchi or rales Abdomen: Soft, non-tender, non-distended. +BS Extremities: No edema b/l lower extremities Dialysis Access: LUE AVF + t/b  Additional Objective Labs: Basic Metabolic Panel: Recent Labs  Lab 03/26/20 0105 03/27/20 0245 03/28/20 0648  NA 125* 129* 135  K 3.8 3.8 3.7  CL 87* 90* 96*  CO2 23 24 27   GLUCOSE 145* 125* 170*  BUN 42* 62* 34*  CREATININE 6.97* 9.05* 6.36*  CALCIUM 8.9 8.9 9.0   Liver Function Tests: Recent Labs  Lab 03/26/20 0905  AST 37  ALT 22  ALKPHOS 129*  BILITOT 2.0*  PROT 6.8  ALBUMIN 3.0*   CBC: Recent Labs  Lab 03/26/20 0105 03/27/20 0245 03/28/20 0648  WBC 20.0* 15.1* 12.2*  NEUTROABS  --   --  6.3  HGB 13.1 11.8* 12.2*  HCT 39.5 35.3* 36.9*  MCV 102.9* 100.6* 102.2*  PLT 333 322 364   Blood Culture    Component Value Date/Time   SDES WOUND 03/27/2020 1247   SPECREQUEST RIGHT FIFTH TOE SPEC A 03/27/2020 1247   CULT PENDING 03/27/2020 1247   REPTSTATUS PENDING 03/27/2020 1247   CBG: Recent Labs  Lab 03/27/20 0740 03/27/20 1033 03/27/20 1313 03/27/20 2104 03/28/20 0725  GLUCAP 139* 137* 132* 226* 152*    Studies/Results: DG Ankle Complete Right  Result Date: 03/26/2020 CLINICAL DATA:   Diabetes with neuropathy. Evaluate for osteomyelitis. EXAM: RIGHT ANKLE - COMPLETE 3+ VIEW COMPARISON:  None. FINDINGS: Ankle mortise is normal. Mild diffuse decreased bone mineralization. Moderate degenerative changes over the midfoot and hindfoot as well as the tibiotalar joint. No evidence of bone destruction to suggest osteomyelitis. Spurring over the posterior and inferior calcaneus. IMPRESSION: No acute findings. Electronically Signed   By: Marin Olp M.D.   On: 03/26/2020 10:49   MR FOOT RIGHT WO CONTRAST  Result Date: 03/27/2020 CLINICAL DATA:  Open wound of the fifth toe, question of osteomyelitis EXAM: MRI OF THE RIGHT FOREFOOT WITHOUT CONTRAST TECHNIQUE: Multiplanar, multisequence MR imaging of the right was performed. No intravenous contrast was administered. COMPARISON:  None. FINDINGS: Bones/Joint/Cartilage There is diffusely increased T2 signal with subtle T1 hypointensity on the plantar surface of the fifth proximal phalanx within overlying a area of ulceration. There is also increased T2 hyperintense signal without T1 hypointensity within the fifth metatarsal head, distal fifth phalanx, and fourth phalanges. Mild midfoot osteoarthritis seen with joint space loss and subchondral cystic changes. First MTP joint osteoarthritis is seen. Ligaments The Lisfranc ligaments appear to be intact. Muscles and Tendons Diffuse fatty atrophy seen within the muscles surrounding the forefoot. The flexor and extensor tendons appear to be intact increased  heterogeneous signal seen within the abductor digiti minimi tendon. Soft tissues There is a focal area of ulceration overlying the dorsal surface of the fifth metatarsal head with a loculated fluid collection measuring 1.4 x 0.7 x 1.5 cm. There is diffusely increased dorsal subcutaneous edema noted. IMPRESSION: 1. Area of ulceration on the dorsal surface of the fifth metatarsal head with a loculated fluid collection measuring 1.4 x 0.7 x 1.5 cm, consistent  with subcutaneous abscess. 2. Findings suggestive of early osteomyelitis involving the fifth proximal phalanx. 3. Probable reactive marrow seen throughout the fifth metatarsal head, distal fifth phalanx and fourth phalanges. Electronically Signed   By: Prudencio Pair M.D.   On: 03/27/2020 00:58   DG Foot 2 Views Right  Result Date: 03/27/2020 CLINICAL DATA:  Right foot wound infection EXAM: RIGHT FOOT - 2 VIEW COMPARISON:  MRI 03/27/2020, radiograph 03/26/2020 FINDINGS: Interval amputation of the fifth digit at the level of the mid metatarsal. Cut edges are smooth. Degenerative changes at the first MTP joint. Small plantar calcaneal spur. IMPRESSION: Interval amputation of the fifth digit at the level of the mid metatarsal. No acute osseous abnormality Electronically Signed   By: Donavan Foil M.D.   On: 03/27/2020 20:49   DG Foot Complete Right  Result Date: 03/26/2020 CLINICAL DATA:  History of diabetes and neuropathy. Open wound between fourth and fifth toes 1 week. Evaluate for osteomyelitis. EXAM: RIGHT FOOT COMPLETE - 3+ VIEW COMPARISON:  None. FINDINGS: Soft tissue wound over the plantar surface of the forefoot adjacent the fifth MTP joint. No significant air in the soft tissues. Slight lucency over the head of the fifth metatarsal on the AP view without discrete bone destruction. No other areas of bone destruction to suggest osteomyelitis. There is moderate degenerative change over the first MTP joint and midfoot/hindfoot. Spurring over the posterior and inferior calcaneus. IMPRESSION: 1. Soft tissue wound over the plantar aspect of the forefoot adjacent the fifth MTP joint. Subtle lucency over the head of the fifth metatarsal mildly suspicious for osteomyelitis. No discrete bone destruction. 2.  Moderate degenerative changes as described Electronically Signed   By: Marin Olp M.D.   On: 03/26/2020 10:41   Medications: . sodium chloride 10 mL/hr at 03/27/20 1219  . cefTRIAXone (ROCEPHIN)  IV 2 g  (03/28/20 0910)  . metronidazole 500 mg (03/28/20 0224)  . vancomycin 1,000 mg (03/27/20 1743)   . amLODipine  5 mg Oral Daily  . Chlorhexidine Gluconate Cloth  6 each Topical Q0600  . docusate sodium  100 mg Oral BID  . doxercalciferol  5 mcg Oral Q M,W,F-HD  . feeding supplement (NEPRO CARB STEADY)  237 mL Oral BID BM  . feeding supplement (PRO-STAT SUGAR FREE 64)  30 mL Oral BID  . ferric citrate  420 mg Oral TID WC  . heparin  5,000 Units Subcutaneous Q8H  . insulin aspart  0-6 Units Subcutaneous TID WC  . multivitamin  1 tablet Oral QHS  . nicotine  14 mg Transdermal Daily  . pantoprazole  20 mg Oral Daily    Dialysis Orders: [place] MWF 4h 400/800 82kg 2/2 bath AVF L Hep 3000 - hect 5 - no esa  Assessment/Plan: 1. R diabetic foot infection/osteomyelitis: Seen by podiatry, s/p 5th ray amputation on 6/21. Plan to turn to OR tomorrow per pt. On IV rocephin, flagyl and vancomycin. WBC trending down.  2. ESRD: MWF schedule. K+ controlled. No volume overload or uremic sx on exam. Hyponatremia resolved s/p HD. Continue MWF  schedule, next HD 6/23. 3. HTN/volume:  BP elevated. S/p 2.8L UF with HD yesterday, now slightly below EDW. Not volume overloaded on exam. Continue amlodipine.  4. Anemia: Hgb 12.2. No ESA/Fe indicated.  5. Secondary hyperparathyroidism:  Calcium at goal. Continue hectorol. Auryxia resumed. 6. Nutrition:  Renal diet/ 1.2L fluid restrictions 7. T2DM: Insulin per primary  Anice Paganini, PA-C 03/28/2020, 9:23 AM  [facility] Pager: [phone]  Patient seen and examined, agree with above note with above modifications. No c/o's this afternoon-  Needs another operative procedure on his foot, now schedule for Thursday-  Plan for routine HD tomorrow  Corliss Parish, MD 03/28/2020

## 2020-03-28 NOTE — Progress Notes (Signed)
PROGRESS NOTE                                                                                                                                                                                                             Patient Demographics:    Brian [name], is a 59 y.o. male, DOB - 12-23-1960, WGY:[id number]  Admit date - 03/26/2020   Admitting Physician [name], MD  Outpatient Primary MD for the patient is [name], MD  LOS - 2  Chief Complaint  Patient presents with  . Wound Infection       Brief Narrative  Brian [name] is a 59 y.o. male with medical history significant of CVA; HTN; DM; cryptogenic cirrhosis; and ESRD on HD presenting with a toe infection, work-up suggested right foot osteomyelitis, podiatry was consulted along with nephrology and he was admitted to the hospital.   Subjective:   Patient in bed, appears comfortable, denies any headache, no fever, no chest pain or pressure, no shortness of breath , no abdominal pain. No focal weakness.    Assessment  & Plan :     1.  Right foot infection with right fifth metatarsal head ulceration, abscess along with possible right fifth proximal phalanx and fifth toe osteomyelitis.  Currently on empiric antibiotics, inflammatory markers elevated, underwent ray amputation office right fifth toe on 03/27/2020, plan is to revisit OR in the next few days and continue IV antibiotics for now.  Follow inflammatory markers and culture results.  Recent Labs  Lab 03/26/20 0105 03/26/20 1138 03/27/20 0245 03/28/20 0648  WBC 20.0*  --  15.1* 12.2*  PLT 333  --  322 364  CRP  --  12.4*  --  14.8*    2.  ESRD.  On MWF schedule.  Renal on board.  3.  Ongoing tobacco abuse.  Counseled to quit.  4.  Essential hypertension on Norvasc added hydralazine as needed.  5.  DM type II with Karlene Lineman.  Sliding scale for now.  Lab Results  Component Value Date   HGBA1C 6.5 (H) 03/26/2020   CBG (last 3)  Recent  Labs    03/27/20 1313 03/27/20 2104 03/28/20 0725  GLUCAP 132* 226* 152*      Condition - Fair  Family Communication  :  None  Code Status :  Full  Consults  :  Renal, Podiatry  Procedures  :    MRI R foot -  1. Area of ulceration on the dorsal surface  of the fifth metatarsal head with a loculated fluid collection measuring 1.4 x 0.7 x 1.5 cm, consistent with subcutaneous abscess. 2. Findings suggestive of early osteomyelitis involving the fifth proximal phalanx. 3. Probable reactive marrow seen throughout the fifth metatarsal head, distal fifth phalanx and fourth phalanges.  Ray amputation of the right fifth toe on 03/27/2020 by podiatrist Dr. Mayo Ao.    PUD Prophylaxis : PPI  Disposition Plan  :    Status is: Inpatient    Dispo: The patient is from: Home              Anticipated d/c is to: Home              Anticipated d/c date is: > 3 days              Patient currently is not medically stable to d/c.   DVT Prophylaxis  :   Heparin    Lab Results  Component Value Date   PLT 364 03/28/2020    Diet :  Diet Order            Diet renal/carb modified with fluid restriction Diet-HS Snack? Nothing; Fluid restriction: 1200 mL Fluid; Room service appropriate? Yes; Fluid consistency: Thin  Diet effective now                  Inpatient Medications Scheduled Meds: . amLODipine  5 mg Oral Daily  . Chlorhexidine Gluconate Cloth  6 each Topical Q0600  . docusate sodium  100 mg Oral BID  . doxercalciferol  5 mcg Oral Q M,W,F-HD  . feeding supplement (NEPRO CARB STEADY)  237 mL Oral BID BM  . feeding supplement (PRO-STAT SUGAR FREE 64)  30 mL Oral BID  . ferric citrate  420 mg Oral TID WC  . heparin  5,000 Units Subcutaneous Q8H  . insulin aspart  0-6 Units Subcutaneous TID WC  . multivitamin  1 tablet Oral QHS  . nicotine  14 mg Transdermal Daily  . pantoprazole  20 mg Oral Daily   Continuous Infusions: . sodium chloride 10 mL/hr at 03/27/20 1219  .  cefTRIAXone (ROCEPHIN)  IV 2 g (03/28/20 0910)  . metronidazole 500 mg (03/28/20 1117)  . vancomycin 1,000 mg (03/27/20 1743)   PRN Meds:.acetaminophen **OR** acetaminophen, ondansetron **OR** ondansetron (ZOFRAN) IV  Antibiotics  :   Anti-infectives (From admission, onward)   Start     Dose/Rate Route Frequency Ordered Stop   03/27/20 1200  vancomycin (VANCOCIN) IVPB 1000 mg/200 mL premix     Discontinue     1,000 mg 200 mL/hr over 60 Minutes Intravenous Every M-W-F (Hemodialysis) 03/26/20 1047     03/26/20 1045  cefTRIAXone (ROCEPHIN) 2 g in sodium chloride 0.9 % 100 mL IVPB     Discontinue     2 g 200 mL/hr over 30 Minutes Intravenous Every 24 hours 03/26/20 1036     03/26/20 1045  metroNIDAZOLE (FLAGYL) IVPB 500 mg     Discontinue     500 mg 100 mL/hr over 60 Minutes Intravenous Every 8 hours 03/26/20 1036     03/26/20 0900  vancomycin (VANCOREADY) IVPB 1750 mg/350 mL        1,750 mg 175 mL/hr over 120 Minutes Intravenous  Once 03/26/20 0852 03/26/20 1450   03/26/20 0900  ceFEPIme (MAXIPIME) 2 g in sodium chloride 0.9 % 100 mL IVPB  Status:  Discontinued        2 g 200 mL/hr over 30 Minutes Intravenous Every  24 hours 03/26/20 0852 03/26/20 0854   03/26/20 0900  ceFEPIme (MAXIPIME) 1 g in sodium chloride 0.9 % 100 mL IVPB  Status:  Discontinued        1 g 200 mL/hr over 30 Minutes Intravenous Every 24 hours 03/26/20 0854 03/26/20 1036   03/26/20 0845  vancomycin (VANCOCIN) IVPB 1000 mg/200 mL premix  Status:  Discontinued        1,000 mg 200 mL/hr over 60 Minutes Intravenous  Once 03/26/20 0840 03/26/20 0852   03/26/20 0845  aztreonam (AZACTAM) 2 g in sodium chloride 0.9 % 100 mL IVPB  Status:  Discontinued        2 g 200 mL/hr over 30 Minutes Intravenous  Once 03/26/20 0840 03/26/20 0852          Objective:   Vitals:   03/27/20 1845 03/27/20 2128 03/28/20 0524 03/28/20 0752  BP: (!) 154/92 (!) 140/94 (!) 153/94 (!) 166/87  Pulse: 82 89 84   Resp: 19 18 18    Temp: (!)  97.5 F (36.4 C) 100.3 F (37.9 C) 98.9 F (37.2 C)   TempSrc: Oral Oral Oral   SpO2: 97%  100%   Weight: 81.7 kg     Height:        SpO2: 100 %  Wt Readings from Last 3 Encounters:  03/27/20 81.7 kg  03/07/20 84.8 kg  02/22/20 84.8 kg     Intake/Output Summary (Last 24 hours) at 03/28/2020 1133 Last data filed at 03/28/2020 0900 Gross per 24 hour  Intake 542.52 ml  Output 2859 ml  Net -2316.48 ml     Physical Exam  Awake Alert, No new F.N deficits, Normal affect [place].AT,PERRAL Supple Neck,No JVD, No cervical lymphadenopathy appriciated.  Symmetrical Chest wall movement, Good air movement bilaterally, CTAB RRR,No Gallops,Rubs or new Murmurs, No Parasternal Heave +ve B.Sounds, Abd Soft, No tenderness, No organomegaly appriciated, No rebound - guarding or rigidity. No Cyanosis, Clubbing or edema, right foot under bandage,     Data Review:    Recent Labs  Lab 03/26/20 0105 03/27/20 0245 03/28/20 0648  WBC 20.0* 15.1* 12.2*  HGB 13.1 11.8* 12.2*  HCT 39.5 35.3* 36.9*  PLT 333 322 364  MCV 102.9* 100.6* 102.2*  MCH 34.1* 33.6 33.8  MCHC 33.2 33.4 33.1  RDW 12.3 11.9 12.1  LYMPHSABS  --   --  2.9  MONOABS  --   --  2.5*  EOSABS  --   --  0.2  BASOSABS  --   --  0.2*    Recent Labs  Lab 03/26/20 0105 03/26/20 0905 03/26/20 1138 03/27/20 0245 03/28/20 0648  NA 125*  --   --  129* 135  K 3.8  --   --  3.8 3.7  CL 87*  --   --  90* 96*  CO2 23  --   --  24 27  GLUCOSE 145*  --   --  125* 170*  BUN 42*  --   --  62* 34*  CREATININE 6.97*  --   --  9.05* 6.36*  CALCIUM 8.9  --   --  8.9 9.0  AST  --  37  --   --   --   ALT  --  22  --   --   --   ALKPHOS  --  129*  --   --   --   BILITOT  --  2.0*  --   --   --  ALBUMIN  --  3.0*  --   --   --   CRP  --   --  12.4*  --  14.8*  INR  --  1.0  --   --   --   HGBA1C  --   --  6.5*  --   --   BNP  --   --   --   --  1,261.8*    Recent Labs  Lab 03/26/20 1138 03/28/20 0648  CRP 12.4* 14.8*  BNP   --  1,261.8*  SARSCOV2NAA NEGATIVE  --     ------------------------------------------------------------------------------------------------------------------ No results for input(s): CHOL, HDL, LDLCALC, TRIG, CHOLHDL, LDLDIRECT in the last 72 hours.  Lab Results  Component Value Date   HGBA1C 6.5 (H) 03/26/2020   ------------------------------------------------------------------------------------------------------------------ No results for input(s): TSH, T4TOTAL, T3FREE, THYROIDAB in the last 72 hours.  Invalid input(s): FREET3 ------------------------------------------------------------------------------------------------------------------ No results for input(s): VITAMINB12, FOLATE, FERRITIN, TIBC, IRON, RETICCTPCT in the last 72 hours.  Coagulation profile Recent Labs  Lab 03/26/20 0905  INR 1.0    No results for input(s): DDIMER in the last 72 hours.  Cardiac Enzymes No results for input(s): CKMB, TROPONINI, MYOGLOBIN in the last 168 hours.  Invalid input(s): CK ------------------------------------------------------------------------------------------------------------------    Component Value Date/Time   BNP 1,261.8 (H) 03/28/2020 1884    Micro Results Recent Results (from the past 240 hour(s))  Blood Culture (routine x 2)     Status: None (Preliminary result)   Collection Time: 03/26/20  8:48 AM   Specimen: BLOOD RIGHT HAND  Result Value Ref Range Status   Specimen Description BLOOD RIGHT HAND  Final   Special Requests   Final    BOTTLES DRAWN AEROBIC AND ANAEROBIC Blood Culture adequate volume   Culture   Final    NO GROWTH 2 DAYS Performed at [facility], [address], [place], [place] [address]    Report Status PENDING  Incomplete  SARS Coronavirus 2 by RT PCR (hospital order, performed in [facility] hospital lab) Nasopharyngeal Nasopharyngeal Swab     Status: None   Collection Time: 03/26/20 11:38 AM   Specimen: Nasopharyngeal Swab  Result  Value Ref Range Status   SARS Coronavirus 2 NEGATIVE NEGATIVE Final    Comment: (NOTE) SARS-CoV-2 target nucleic acids are NOT DETECTED.  The SARS-CoV-2 RNA is generally detectable in upper and lower respiratory specimens during the acute phase of infection. The lowest concentration of SARS-CoV-2 viral copies this assay can detect is 250 copies / mL. A negative result does not preclude SARS-CoV-2 infection and should not be used as the sole basis for treatment or other patient management decisions.  A negative result may occur with improper specimen collection / handling, submission of specimen other than nasopharyngeal swab, presence of viral mutation(s) within the areas targeted by this assay, and inadequate number of viral copies (<250 copies / mL). A negative result must be combined with clinical observations, patient history, and epidemiological information.  Fact Sheet for Patients:   StrictlyIdeas.no  Fact Sheet for Healthcare Providers: BankingDealers.co.za  This test is not yet approved or  cleared by the [place] FDA and has been authorized for detection and/or diagnosis of SARS-CoV-2 by FDA under an Emergency Use Authorization (EUA).  This EUA will remain in effect (meaning this test can be used) for the duration of the COVID-19 declaration under Section 564(b)(1) of the Act, 21 U.S.C. section 360bbb-3(b)(1), unless the authorization is terminated or revoked sooner.  Performed at [facility], [place] [address] [place], [place] [address]   Surgical PCR screen     Status: None   Collection Time: 03/26/20  5:43 PM   Specimen: Nasal Mucosa; Nasal Swab  Result Value Ref Range Status   MRSA, PCR NEGATIVE NEGATIVE Final   Staphylococcus aureus NEGATIVE NEGATIVE Final    Comment: (NOTE) The Xpert SA Assay (FDA approved for NASAL specimens in patients 6 years of age and older), is one component of a  comprehensive surveillance program. It is not intended to diagnose infection nor to guide or monitor treatment. Performed at [facility], [place] [address] [place], [place] [address]   Blood Culture (routine x 2)     Status: None (Preliminary result)   Collection Time: 03/26/20  6:44 PM   Specimen: BLOOD RIGHT HAND  Result Value Ref Range Status   Specimen Description BLOOD RIGHT HAND  Final   Special Requests   Final    BOTTLES DRAWN AEROBIC ONLY Blood Culture adequate volume   Culture   Final    NO GROWTH 2 DAYS Performed at [facility], [place] [address] [place], [place] [address]    Report Status PENDING  Incomplete  Aerobic/Anaerobic Culture (surgical/deep wound)     Status: None (Preliminary result)   Collection Time: 03/27/20 12:47 PM   Specimen: Toe, Right; Wound  Result Value Ref Range Status   Specimen Description WOUND  Final   Special Requests RIGHT FIFTH TOE SPEC A  Final   Gram Stain   Final    RARE WBC PRESENT,BOTH PMN AND MONONUCLEAR FEW GRAM NEGATIVE COCCOBACILLI    Culture   Final    NO GROWTH < 24 HOURS Performed at [facility], [place] [address] [place], [place] [address]    Report Status PENDING  Incomplete    Radiology Reports DG Ankle Complete Right  Result Date: 03/26/2020 CLINICAL DATA:  Diabetes with neuropathy. Evaluate for osteomyelitis. EXAM: RIGHT ANKLE - COMPLETE 3+ VIEW COMPARISON:  None. FINDINGS: Ankle mortise is normal. Mild diffuse decreased bone mineralization. Moderate degenerative changes over the midfoot and hindfoot as well as the tibiotalar joint. No evidence of bone destruction to suggest osteomyelitis. Spurring over the posterior and inferior calcaneus. IMPRESSION: No acute findings. Electronically Signed   By: Marin Olp M.D.   On: 03/26/2020 10:49   MR FOOT RIGHT WO CONTRAST  Result Date: 03/27/2020 CLINICAL DATA:  Open wound of the fifth toe, question of osteomyelitis EXAM: MRI OF THE RIGHT FOREFOOT WITHOUT  CONTRAST TECHNIQUE: Multiplanar, multisequence MR imaging of the right was performed. No intravenous contrast was administered. COMPARISON:  None. FINDINGS: Bones/Joint/Cartilage There is diffusely increased T2 signal with subtle T1 hypointensity on the plantar surface of the fifth proximal phalanx within overlying a area of ulceration. There is also increased T2 hyperintense signal without T1 hypointensity within the fifth metatarsal head, distal fifth phalanx, and fourth phalanges. Mild midfoot osteoarthritis seen with joint space loss and subchondral cystic changes. First MTP joint osteoarthritis is seen. Ligaments The Lisfranc ligaments appear to be intact. Muscles and Tendons Diffuse fatty atrophy seen within the muscles surrounding the forefoot. The flexor and extensor tendons appear to be intact increased heterogeneous signal seen within the abductor digiti minimi tendon. Soft tissues There is a focal area of ulceration overlying the dorsal surface of the fifth metatarsal head with a loculated fluid collection measuring 1.4 x 0.7 x 1.5 cm. There is diffusely increased dorsal subcutaneous edema noted. IMPRESSION: 1.  Area of ulceration on the dorsal surface of the fifth metatarsal head with a loculated fluid collection measuring 1.4 x 0.7 x 1.5 cm, consistent with subcutaneous abscess. 2. Findings suggestive of early osteomyelitis involving the fifth proximal phalanx. 3. Probable reactive marrow seen throughout the fifth metatarsal head, distal fifth phalanx and fourth phalanges. Electronically Signed   By: Prudencio Pair M.D.   On: 03/27/2020 00:58   DG Foot 2 Views Right  Result Date: 03/27/2020 CLINICAL DATA:  Right foot wound infection EXAM: RIGHT FOOT - 2 VIEW COMPARISON:  MRI 03/27/2020, radiograph 03/26/2020 FINDINGS: Interval amputation of the fifth digit at the level of the mid metatarsal. Cut edges are smooth. Degenerative changes at the first MTP joint. Small plantar calcaneal spur. IMPRESSION:  Interval amputation of the fifth digit at the level of the mid metatarsal. No acute osseous abnormality Electronically Signed   By: Donavan Foil M.D.   On: 03/27/2020 20:49   DG Foot Complete Right  Result Date: 03/26/2020 CLINICAL DATA:  History of diabetes and neuropathy. Open wound between fourth and fifth toes 1 week. Evaluate for osteomyelitis. EXAM: RIGHT FOOT COMPLETE - 3+ VIEW COMPARISON:  None. FINDINGS: Soft tissue wound over the plantar surface of the forefoot adjacent the fifth MTP joint. No significant air in the soft tissues. Slight lucency over the head of the fifth metatarsal on the AP view without discrete bone destruction. No other areas of bone destruction to suggest osteomyelitis. There is moderate degenerative change over the first MTP joint and midfoot/hindfoot. Spurring over the posterior and inferior calcaneus. IMPRESSION: 1. Soft tissue wound over the plantar aspect of the forefoot adjacent the fifth MTP joint. Subtle lucency over the head of the fifth metatarsal mildly suspicious for osteomyelitis. No discrete bone destruction. 2.  Moderate degenerative changes as described Electronically Signed   By: Marin Olp M.D.   On: 03/26/2020 10:41   DG Toe 5th Right  Result Date: 03/26/2020 CLINICAL DATA:  Toe infection EXAM: RIGHT FIFTH TOE COMPARISON:  None. FINDINGS: There is question of periosteal reaction seen with cortical step-off at the base of the distal fifth phalanx. Diffuse overlying soft tissue swelling with subcutaneous emphysema is seen. IMPRESSION: Cortical irregularity and periosteal reaction of the fifth distal phalanx which could be due to osteomyelitis. Overlying soft tissue swelling and subcutaneous emphysema. Electronically Signed   By: Prudencio Pair M.D.   On: 03/26/2020 01:38    Time Spent in minutes  30   Lala Lund M.D on 03/28/2020 at 11:33 AM  To page go to [email or web address] - password [email or web address]

## 2020-03-28 NOTE — Plan of Care (Signed)

## 2020-03-28 NOTE — Progress Notes (Signed)
Inpatient Diabetes Program Recommendations  AACE/ADA: New Consensus Statement on Inpatient Glycemic Control (2015)  Target Ranges:  Prepandial:   less than 140 mg/dL      Peak postprandial:   less than 180 mg/dL (1-2 hours)      Critically ill patients:  140 - 180 mg/dL   Lab Results  Component Value Date   GLUCAP 195 (H) 03/28/2020   HGBA1C 6.5 (H) 03/26/2020    Review of Glycemic Control Results for Brian [name], Brian [name] (MRN [id number]) as of 03/28/2020 13:15  Ref. Range 03/27/2020 10:33 03/27/2020 13:13 03/27/2020 21:04 03/28/2020 07:25 03/28/2020 11:35  Glucose-Capillary Latest Ref Range: 70 - 99 mg/dL 137 (H) 132 (H) 226 (H) 152 (H) 195 (H)   Diabetes history:  DM2  Outpatient Diabetes medications: Novolin 70/30 10-15 units qhs depending on what he eats  Current orders for Inpatient glycemic control:  Novolog 0-6 units tid    Note:  Spoke with patient at bedside regarding DM2 and foot wound.  Reviewed patient's current A1c of 6.5% (average A1C of 140 mg/dl). Explained what a A1c is and what it measures.  He is aware.  Also reviewed goal A1c with patient, importance of good glucose control @ home, and blood sugar goals.  He states his wound started a few months back as a callus.  Explained importance of tight glucose control for optimal healing.  He states he does not drink any sugary drinks and tries to watch his CHO intake.  Reviewed the Plate Method, CHO goal per meal and importance of exercise once able.    He has not seen his PCP, Dr. Arelia Sneddon in several months.  He obtains his insulin at [facility] due to affordability.  He takes the above prescribed medications.  Denies difficulties obtaining.  He checks his CBG once at bedtime.  Reviewed hypoglycemia.  States he can feel it when he is low and it does not happen often.    Will continue to follow while inpatient.  Thank you, Reche Dixon, RN, BSN Diabetes Coordinator Inpatient Diabetes Program [phone] (team pager from  8a-5p)

## 2020-03-28 NOTE — Progress Notes (Signed)
   03/28/20 2126  Assess: MEWS Score  Temp 98.1 F (36.7 C)  BP (!) 167/82  Pulse Rate 79  Resp 16  Level of Consciousness Alert  SpO2 100 %  O2 Device Room Air  Assess: MEWS Score  MEWS Temp 0  MEWS Systolic 0  MEWS Pulse 0  MEWS RR 0  MEWS LOC 0  MEWS Score 0  MEWS Score Color Green  Document  Progress note created (see row info) Yes

## 2020-03-28 NOTE — Evaluation (Signed)
Physical Therapy Evaluation and Discharge Patient Details Name: Brian [name] MRN: [id number] DOB: 12-05-1960 Today's Date: 03/28/2020   History of Present Illness  59 y.o. male with medical history significant of CVA; HTN; DM; cryptogenic cirrhosis; and ESRD on HD MWF presented 03/26/20 with a toe infection. Xray ?osteomyelitis. 03/27/20 rt 5th ray amputation  Clinical Impression   Patient evaluated by Physical Therapy with no further PT needs identified. All education has been completed and the patient has no further questions. He was able to demonstrate ambulation with orthowedge shoe and sneaker x 200 ft modified independent and 1 flight of stairs with single rail. PT is signing off. Thank you for this referral.     Follow Up Recommendations No PT follow up    Equipment Recommendations  None recommended by PT    Recommendations for Other Services       Precautions / Restrictions Precautions Precautions: Fall Restrictions Weight Bearing Restrictions: No Other Position/Activity Restrictions: issued darco orthowedge shoe      Mobility  Bed Mobility Overal bed mobility: Independent                Transfers Overall transfer level: Independent                  Ambulation/Gait Ambulation/Gait assistance: Min guard;Modified independent (Device/Increase time) Gait Distance (Feet): 200 Feet Assistive device: None Gait Pattern/deviations: Step-to pattern;Decreased step length - right;Decreased step length - left Gait velocity: appropriately decreased   General Gait Details: using darco wedge shoe and sneaker on other foot. Initial slight imbalance as figuring out the shoe, but progressed to modified independent  Stairs Stairs: Yes Stairs assistance: Min guard;Supervision Stair Management: One rail Right;Step to pattern;Forwards Number of Stairs: 10 General stair comments: pt instructed to lead with left foot and did well with this technique, however then  decided to try to lead with rt foot with imbalance (he recovered himself) and agreed better to lead with LLE. On descent, insists on leading off with LLE as he does not trust rt knee "to catch him...it might buckle". He externally rotates RLE and stands on heel of wedge shoe with heavy reliance on rail as stepping left foot down and then right  Wheelchair Mobility    Modified Rankin (Stroke Patients Only)       Balance Overall balance assessment: Mild deficits observed, not formally tested (initially, progressed to modified independent)                                           Pertinent Vitals/Pain Pain Assessment: No/denies pain    Home Living Family/patient expects to be discharged to:: Private residence Living Arrangements: Alone Available Help at Discharge: Family;Available PRN/intermittently (brothers, sister ) Type of Home: House Home Access: Stairs to enter Entrance Stairs-Rails: Right Entrance Stairs-Number of Steps: 4 Home Layout: One level Home Equipment: Cane - single point      Prior Function Level of Independence: Independent         Comments: usually does sponge bathe; on disability, 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     Hand Dominance   Dominant Hand: Right    Extremity/Trunk Assessment   Upper Extremity Assessment Upper Extremity Assessment: Overall WFL for tasks assessed    Lower Extremity Assessment Lower Extremity Assessment: RLE deficits/detail RLE Deficits / Details: large bandage rt foot RLE Sensation: history of peripheral neuropathy  Communication   Communication: No difficulties  Cognition Arousal/Alertness: Awake/alert Behavior During Therapy: WFL for tasks assessed/performed Overall Cognitive Status: Within Functional Limits for tasks assessed                                        General Comments General comments (skin integrity, edema, etc.): Educated pt in importance of  elevating RLE to decr edema and promote healing. Pt instructed in how to use pillows and how to elevate foot of bed while lowering HOB. Pt left in upright position to finish eating his breakfast    Exercises Other Exercises Other Exercises: toe wiggling and ankle pumps for edema managment   Assessment/Plan    PT Assessment Patent does not need any further PT services  PT Problem List         PT Treatment Interventions      PT Goals (Current goals can be found in the Care Plan section)  Acute Rehab PT Goals Patient Stated Goal: go home PT Goal Formulation: All assessment and education complete, DC therapy    Frequency     Barriers to discharge        Co-evaluation               AM-PAC PT "6 Clicks" Mobility  Outcome Measure Help needed turning from your back to your side while in a flat bed without using bedrails?: None Help needed moving from lying on your back to sitting on the side of a flat bed without using bedrails?: None Help needed moving to and from a bed to a chair (including a wheelchair)?: None Help needed standing up from a chair using your arms (e.g., wheelchair or bedside chair)?: None Help needed to walk in hospital room?: None Help needed climbing 3-5 steps with a railing? : A Little 6 Click Score: 23    End of Session Equipment Utilized During Treatment: Other (comment) (orthowedge shoe) Activity Tolerance: Patient tolerated treatment well Patient left: in bed;with call bell/phone within reach Nurse Communication: Mobility status;Other (comment) (encourage elevation and AROM) PT Visit Diagnosis: Difficulty in walking, not elsewhere classified (R26.2)    Time: 7672-0947 PT Time Calculation (min) (ACUTE ONLY): 26 min   Charges:   PT Evaluation $PT Eval Low Complexity: 1 Low PT Treatments $Gait Training: 8-22 mins         Brian [name], PT Pager [phone]   Rexanne Mano 03/28/2020, 9:18 AM

## 2020-03-29 ENCOUNTER — Inpatient Hospital Stay (HOSPITAL_COMMUNITY): Payer: Medicare Other

## 2020-03-29 DIAGNOSIS — E11628 Type 2 diabetes mellitus with other skin complications: Secondary | ICD-10-CM

## 2020-03-29 DIAGNOSIS — L02611 Cutaneous abscess of right foot: Secondary | ICD-10-CM

## 2020-03-29 LAB — CBC WITH DIFFERENTIAL/PLATELET
Abs Immature Granulocytes: 0.23 10*3/uL — ABNORMAL HIGH (ref 0.00–0.07)
Basophils Absolute: 0.2 10*3/uL — ABNORMAL HIGH (ref 0.0–0.1)
Basophils Relative: 2 %
Eosinophils Absolute: 0.6 10*3/uL — ABNORMAL HIGH (ref 0.0–0.5)
Eosinophils Relative: 5 %
HCT: 37.5 % — ABNORMAL LOW (ref 39.0–52.0)
Hemoglobin: 12.4 g/dL — ABNORMAL LOW (ref 13.0–17.0)
Immature Granulocytes: 2 %
Lymphocytes Relative: 35 %
Lymphs Abs: 4.7 10*3/uL — ABNORMAL HIGH (ref 0.7–4.0)
MCH: 34.3 pg — ABNORMAL HIGH (ref 26.0–34.0)
MCHC: 33.1 g/dL (ref 30.0–36.0)
MCV: 103.6 fL — ABNORMAL HIGH (ref 80.0–100.0)
Monocytes Absolute: 2.3 10*3/uL — ABNORMAL HIGH (ref 0.1–1.0)
Monocytes Relative: 17 %
Neutro Abs: 5.4 10*3/uL (ref 1.7–7.7)
Neutrophils Relative %: 39 %
Platelets: 382 10*3/uL (ref 150–400)
RBC: 3.62 MIL/uL — ABNORMAL LOW (ref 4.22–5.81)
RDW: 12.2 % (ref 11.5–15.5)
WBC: 13.5 10*3/uL — ABNORMAL HIGH (ref 4.0–10.5)
nRBC: 0 % (ref 0.0–0.2)

## 2020-03-29 LAB — GLUCOSE, CAPILLARY
Glucose-Capillary: 131 mg/dL — ABNORMAL HIGH (ref 70–99)
Glucose-Capillary: 177 mg/dL — ABNORMAL HIGH (ref 70–99)
Glucose-Capillary: 114 mg/dL — ABNORMAL HIGH (ref 70–99)
Glucose-Capillary: 260 mg/dL — ABNORMAL HIGH (ref 70–99)

## 2020-03-29 LAB — BASIC METABOLIC PANEL
Anion gap: 14 (ref 5–15)
BUN: 50 mg/dL — ABNORMAL HIGH (ref 6–20)
CO2: 24 mmol/L (ref 22–32)
Calcium: 9.2 mg/dL (ref 8.9–10.3)
Chloride: 97 mmol/L — ABNORMAL LOW (ref 98–111)
Creatinine, Ser: 7.79 mg/dL — ABNORMAL HIGH (ref 0.61–1.24)
GFR calc Af Amer: 8 mL/min — ABNORMAL LOW (ref >60)
GFR calc non Af Amer: 7 mL/min — ABNORMAL LOW (ref >60)
Glucose, Bld: 160 mg/dL — ABNORMAL HIGH (ref 70–99)
Potassium: 3.3 mmol/L — ABNORMAL LOW (ref 3.5–5.1)
Sodium: 135 mmol/L (ref 135–145)

## 2020-03-29 LAB — BRAIN NATRIURETIC PEPTIDE: B Natriuretic Peptide: 1229.7 pg/mL — ABNORMAL HIGH (ref 0.0–100.0)

## 2020-03-29 LAB — C-REACTIVE PROTEIN: CRP: 11.9 mg/dL — ABNORMAL HIGH (ref <1.0)

## 2020-03-29 LAB — SURGICAL PATHOLOGY

## 2020-03-29 MED ORDER — HYDRALAZINE HCL 50 MG PO TABS
50.0000 mg | ORAL_TABLET | Freq: Three times a day (TID) | ORAL | Status: DC
  Administered 2020-03-29 – 2020-04-01 (×8): 50 mg via ORAL
  Filled 2020-03-29 (×8): qty 1

## 2020-03-29 MED ORDER — CHLORHEXIDINE GLUCONATE CLOTH 2 % EX PADS
6.0000 | MEDICATED_PAD | Freq: Once | CUTANEOUS | Status: AC
  Administered 2020-03-30: 6 via TOPICAL

## 2020-03-29 MED ORDER — VANCOMYCIN HCL IN DEXTROSE 1-5 GM/200ML-% IV SOLN
INTRAVENOUS | Status: AC
  Filled 2020-03-29: qty 200

## 2020-03-29 MED ORDER — DOXERCALCIFEROL 4 MCG/2ML IV SOLN
INTRAVENOUS | Status: AC
  Administered 2020-03-29: 5 ug
  Filled 2020-03-29: qty 4

## 2020-03-29 MED ORDER — AMLODIPINE BESYLATE 10 MG PO TABS
10.0000 mg | ORAL_TABLET | Freq: Every day | ORAL | Status: DC
  Administered 2020-03-30 – 2020-04-02 (×4): 10 mg via ORAL
  Filled 2020-03-29 (×4): qty 1

## 2020-03-29 MED ORDER — CHLORHEXIDINE GLUCONATE CLOTH 2 % EX PADS
6.0000 | MEDICATED_PAD | Freq: Once | CUTANEOUS | Status: AC
  Administered 2020-03-29: 6 via TOPICAL

## 2020-03-29 NOTE — Progress Notes (Addendum)
[name] Progress Note   Subjective:   Pt seen in room. Still having some diarrhea but imodium is helping. No other concerns, denies SOB, CP, dizziness, abdominal pain, nausea and vomiting.   Objective Vitals:   03/28/20 0752 03/28/20 1220 03/28/20 2126 03/29/20 0649  BP: (!) 166/87 (!) 149/81 (!) 167/82   Pulse:  80 79   Resp:  19 16   Temp:  97.9 F (36.6 C) 98.1 F (36.7 C)   TempSrc:  Oral Oral   SpO2:  99% 100%   Weight:    82.8 kg  Height:       Physical Exam General:Well developed male, alert and in NAD Heart:RRR, no murmurs, normal S1/S2 Lungs:CTA b/l without wheezing, rhonchi or rales Abdomen:Soft, non-tender, non-distended. +BS Extremities:No edema b/l lower extremities Dialysis Access:LUE AVF + t/b  Additional Objective Labs: Basic Metabolic Panel: Recent Labs  Lab 03/27/20 0245 03/28/20 0648 03/29/20 0317  NA 129* 135 135  K 3.8 3.7 3.3*  CL 90* 96* 97*  CO2 24 27 24   GLUCOSE 125* 170* 160*  BUN 62* 34* 50*  CREATININE 9.05* 6.36* 7.79*  CALCIUM 8.9 9.0 9.2   Liver Function Tests: Recent Labs  Lab 03/26/20 0905  AST 37  ALT 22  ALKPHOS 129*  BILITOT 2.0*  PROT 6.8  ALBUMIN 3.0*   CBC: Recent Labs  Lab 03/26/20 0105 03/26/20 0105 03/27/20 0245 03/28/20 0648 03/29/20 0317  WBC 20.0*   < > 15.1* 12.2* 13.5*  NEUTROABS  --   --   --  6.3 5.4  HGB 13.1   < > 11.8* 12.2* 12.4*  HCT 39.5   < > 35.3* 36.9* 37.5*  MCV 102.9*  --  100.6* 102.2* 103.6*  PLT 333   < > 322 364 382   < > = values in this interval not displayed.   Blood Culture    Component Value Date/Time   SDES WOUND 03/27/2020 1247   SPECREQUEST RIGHT FIFTH TOE SPEC A 03/27/2020 1247   CULT  03/27/2020 1247    NO GROWTH 2 DAYS Performed at [facility], [place] [address] [place], [place] [address]    REPTSTATUS PENDING 03/27/2020 1247    CBG: Recent Labs  Lab 03/28/20 0725 03/28/20 1135 03/28/20 1559 03/28/20 2121 03/29/20 0615   GLUCAP 152* 195* 192* 238* 131*    Studies/Results: DG Foot 2 Views Right  Result Date: 03/27/2020 CLINICAL DATA:  Right foot wound infection EXAM: RIGHT FOOT - 2 VIEW COMPARISON:  MRI 03/27/2020, radiograph 03/26/2020 FINDINGS: Interval amputation of the fifth digit at the level of the mid metatarsal. Cut edges are smooth. Degenerative changes at the first MTP joint. Small plantar calcaneal spur. IMPRESSION: Interval amputation of the fifth digit at the level of the mid metatarsal. No acute osseous abnormality Electronically Signed   By: Donavan Foil M.D.   On: 03/27/2020 20:49   Medications: . sodium chloride Stopped (03/29/20 0426)  . cefTRIAXone (ROCEPHIN)  IV 2 g (03/28/20 0910)  . metronidazole 500 mg (03/29/20 1038)  . vancomycin 1,000 mg (03/27/20 1743)   . amLODipine  5 mg Oral Daily  . Chlorhexidine Gluconate Cloth  6 each Topical Q0600  . docusate sodium  100 mg Oral BID  . doxercalciferol  5 mcg Oral Q M,W,F-HD  . feeding supplement (NEPRO CARB STEADY)  237 mL Oral BID BM  . feeding supplement (PRO-STAT SUGAR FREE 64)  30 mL Oral BID  . ferric citrate  420 mg  Oral TID WC  . heparin  5,000 Units Subcutaneous Q8H  . insulin aspart  0-6 Units Subcutaneous TID WC  . multivitamin  1 tablet Oral QHS  . nicotine  14 mg Transdermal Daily  . pantoprazole  20 mg Oral Daily    Dialysis Orders: [facility] MWF 4h 400/800 82kg 2/2 bath AVF L Hep 3000 - hect 5 - no esa  Assessment/Plan: 1.R diabetic foot infection/osteomyelitis:Seen by podiatry, s/p 5th ray amputation on 6/21. Plan to turn to OR tomorrow per pt. On IV antibiotics.  2. ESRD:MWF schedule. K+ 3.3- will use 4K bath with HD today (says K+ is usually higher outpatient because he eats a lot of tomatoes). No volume overload or uremic sx on exam. Hyponatremia resolved s/p HD 6/21. Continue MWF schedule. 3.HTN/volume:BP elevated. S/p 2.8L UF with HD 6/21. UFG 2L with HD today.  4. Anemia:Hgb 12.4. No ESA/Fe  indicated. 5. Secondary hyperparathyroidism:Calcium 10.0. Continue hectorol, may need to decrease dose if Ca trends up further. Continue auryxia and check phos with next labs.  6. Nutrition:Renal diet/ 1.2L fluid restrictions 7.T2DM: Insulin per primary  Anice Paganini, PA-C 03/29/2020, 10:52 AM  [facility] Pager: [phone]  Patient seen and examined, agree with above note with above modifications. Seen on HD-  No c/o's-  Back to OR for foot tomorrow- dialysis related labs are lookiing good  Corliss Parish, MD 03/29/2020

## 2020-03-29 NOTE — Procedures (Signed)
Patient was seen on dialysis and the procedure was supervised.  BFR 400  Via AVF BP is  140/76.   Patient appears to be tolerating treatment well  Louis Meckel 03/29/2020

## 2020-03-29 NOTE — Progress Notes (Signed)
PROGRESS NOTE                                                                                                                                                                                                             Patient Demographics:    Brian [name], is a 59 y.o. male, DOB - 1961-06-19, UJW:[id number]  Admit date - 03/26/2020   Admitting Physician [name], MD  Outpatient Primary MD for the patient is [name], MD  LOS - 3  Chief Complaint  Patient presents with  . Wound Infection       Brief Narrative  Brian [name] is a 58 y.o. male with medical history significant of CVA; HTN; DM; cryptogenic cirrhosis; and ESRD on HD presenting with a toe infection, work-up suggested right foot osteomyelitis, podiatry was consulted along with nephrology and he was admitted to the hospital.   Subjective:   Patient in bed, appears comfortable, denies any headache, no fever, no chest pain or pressure, no shortness of breath , no abdominal pain. No focal weakness.   Assessment  & Plan :     1.  Right foot infection with right fifth metatarsal head ulceration, abscess along with possible right fifth proximal phalanx and fifth toe osteomyelitis.  Currently on empiric antibiotics, inflammatory markers elevated, underwent ray amputation office right fifth toe on 03/27/2020, plan is to revisit OR in the next few days and continue IV antibiotics for now.  Follow inflammatory markers and culture results.  Recent Labs  Lab 03/26/20 0105 03/26/20 1138 03/27/20 0245 03/28/20 0648 03/29/20 0317  WBC 20.0*  --  15.1* 12.2* 13.5*  PLT 333  --  322 364 382  CRP  --  12.4*  --  14.8* 11.9*    2.  ESRD.  On MWF schedule.  Renal on board.  3.  Ongoing tobacco abuse.  Counseled to quit.  4.  Essential hypertension - pressure high despite being on Norvasc, will add scheduled hydralazine along with as needed hydralazine.  5.  DM type II with Karlene Lineman.  Sliding scale for  now.  Lab Results  Component Value Date   HGBA1C 6.5 (H) 03/26/2020   CBG (last 3)  Recent Labs    03/28/20 1559 03/28/20 2121 03/29/20 0615  GLUCAP 192* 238* 131*      Condition - Fair  Family Communication  :  None  Code Status :  Full  Consults  :  Renal, Podiatry  Procedures  :  MRI R foot -  1. Area of ulceration on the dorsal surface of the fifth metatarsal head with a loculated fluid collection measuring 1.4 x 0.7 x 1.5 cm, consistent with subcutaneous abscess. 2. Findings suggestive of early osteomyelitis involving the fifth proximal phalanx. 3. Probable reactive marrow seen throughout the fifth metatarsal head, distal fifth phalanx and fourth phalanges.  Ray amputation of the right fifth toe on 03/27/2020 by podiatrist Dr. Mayo Ao.    PUD Prophylaxis : PPI  Disposition Plan  :    Status is: Inpatient    Dispo: The patient is from: Home              Anticipated d/c is to: Home              Anticipated d/c date is: > 3 days              Patient currently is not medically stable to d/c.   DVT Prophylaxis  :   Heparin    Lab Results  Component Value Date   PLT 382 03/29/2020    Diet :  Diet Order            Diet renal/carb modified with fluid restriction Diet-HS Snack? Nothing; Fluid restriction: 1200 mL Fluid; Room service appropriate? Yes; Fluid consistency: Thin  Diet effective now                  Inpatient Medications Scheduled Meds: . amLODipine  5 mg Oral Daily  . Chlorhexidine Gluconate Cloth  6 each Topical Q0600  . docusate sodium  100 mg Oral BID  . doxercalciferol  5 mcg Oral Q M,W,F-HD  . feeding supplement (NEPRO CARB STEADY)  237 mL Oral BID BM  . feeding supplement (PRO-STAT SUGAR FREE 64)  30 mL Oral BID  . ferric citrate  420 mg Oral TID WC  . heparin  5,000 Units Subcutaneous Q8H  . insulin aspart  0-6 Units Subcutaneous TID WC  . multivitamin  1 tablet Oral QHS  . nicotine  14 mg Transdermal Daily  .  pantoprazole  20 mg Oral Daily   Continuous Infusions: . sodium chloride Stopped (03/29/20 0426)  . cefTRIAXone (ROCEPHIN)  IV 2 g (03/28/20 0910)  . metronidazole 500 mg (03/29/20 1038)  . vancomycin 1,000 mg (03/27/20 1743)   PRN Meds:.acetaminophen **OR** acetaminophen, loperamide, ondansetron **OR** ondansetron (ZOFRAN) IV  Antibiotics  :   Anti-infectives (From admission, onward)   Start     Dose/Rate Route Frequency Ordered Stop   03/27/20 1200  vancomycin (VANCOCIN) IVPB 1000 mg/200 mL premix     Discontinue     1,000 mg 200 mL/hr over 60 Minutes Intravenous Every M-W-F (Hemodialysis) 03/26/20 1047     03/26/20 1045  cefTRIAXone (ROCEPHIN) 2 g in sodium chloride 0.9 % 100 mL IVPB     Discontinue     2 g 200 mL/hr over 30 Minutes Intravenous Every 24 hours 03/26/20 1036     03/26/20 1045  metroNIDAZOLE (FLAGYL) IVPB 500 mg     Discontinue     500 mg 100 mL/hr over 60 Minutes Intravenous Every 8 hours 03/26/20 1036     03/26/20 0900  vancomycin (VANCOREADY) IVPB 1750 mg/350 mL        1,750 mg 175 mL/hr over 120 Minutes Intravenous  Once 03/26/20 0852 03/26/20 1450   03/26/20 0900  ceFEPIme (MAXIPIME) 2 g in sodium chloride 0.9 % 100 mL IVPB  Status:  Discontinued  2 g 200 mL/hr over 30 Minutes Intravenous Every 24 hours 03/26/20 0852 03/26/20 0854   03/26/20 0900  ceFEPIme (MAXIPIME) 1 g in sodium chloride 0.9 % 100 mL IVPB  Status:  Discontinued        1 g 200 mL/hr over 30 Minutes Intravenous Every 24 hours 03/26/20 0854 03/26/20 1036   03/26/20 0845  vancomycin (VANCOCIN) IVPB 1000 mg/200 mL premix  Status:  Discontinued        1,000 mg 200 mL/hr over 60 Minutes Intravenous  Once 03/26/20 0840 03/26/20 0852   03/26/20 0845  aztreonam (AZACTAM) 2 g in sodium chloride 0.9 % 100 mL IVPB  Status:  Discontinued        2 g 200 mL/hr over 30 Minutes Intravenous  Once 03/26/20 0840 03/26/20 0852          Objective:   Vitals:   03/28/20 0752 03/28/20 1220 03/28/20  2126 03/29/20 0649  BP: (!) 166/87 (!) 149/81 (!) 167/82   Pulse:  80 79   Resp:  19 16   Temp:  97.9 F (36.6 C) 98.1 F (36.7 C)   TempSrc:  Oral Oral   SpO2:  99% 100%   Weight:    82.8 kg  Height:        SpO2: 100 %  Wt Readings from Last 3 Encounters:  03/29/20 82.8 kg  03/07/20 84.8 kg  02/22/20 84.8 kg     Intake/Output Summary (Last 24 hours) at 03/29/2020 1144 Last data filed at 03/29/2020 0900 Gross per 24 hour  Intake 1617.34 ml  Output --  Net 1617.34 ml     Physical Exam  Awake Alert, No new F.N deficits, Normal affect [place].AT,PERRAL Supple Neck,No JVD, No cervical lymphadenopathy appriciated.  Symmetrical Chest wall movement, Good air movement bilaterally, CTAB RRR,No Gallops, Rubs or new Murmurs, No Parasternal Heave +ve B.Sounds, Abd Soft, No tenderness, No organomegaly appriciated, No rebound - guarding or rigidity. No Cyanosis,  right foot under bandage,     Data Review:    Recent Labs  Lab 03/26/20 0105 03/27/20 0245 03/28/20 0648 03/29/20 0317  WBC 20.0* 15.1* 12.2* 13.5*  HGB 13.1 11.8* 12.2* 12.4*  HCT 39.5 35.3* 36.9* 37.5*  PLT 333 322 364 382  MCV 102.9* 100.6* 102.2* 103.6*  MCH 34.1* 33.6 33.8 34.3*  MCHC 33.2 33.4 33.1 33.1  RDW 12.3 11.9 12.1 12.2  LYMPHSABS  --   --  2.9 4.7*  MONOABS  --   --  2.5* 2.3*  EOSABS  --   --  0.2 0.6*  BASOSABS  --   --  0.2* 0.2*    Recent Labs  Lab 03/26/20 0105 03/26/20 0905 03/26/20 1138 03/27/20 0245 03/28/20 0648 03/29/20 0317  NA 125*  --   --  129* 135 135  K 3.8  --   --  3.8 3.7 3.3*  CL 87*  --   --  90* 96* 97*  CO2 23  --   --  24 27 24   GLUCOSE 145*  --   --  125* 170* 160*  BUN 42*  --   --  62* 34* 50*  CREATININE 6.97*  --   --  9.05* 6.36* 7.79*  CALCIUM 8.9  --   --  8.9 9.0 9.2  AST  --  37  --   --   --   --   ALT  --  22  --   --   --   --  ALKPHOS  --  129*  --   --   --   --   BILITOT  --  2.0*  --   --   --   --   ALBUMIN  --  3.0*  --   --   --   --    CRP  --   --  12.4*  --  14.8* 11.9*  INR  --  1.0  --   --   --   --   HGBA1C  --   --  6.5*  --   --   --   BNP  --   --   --   --  1,261.8* 1,229.7*    Recent Labs  Lab 03/26/20 1138 03/28/20 0648 03/29/20 0317  CRP 12.4* 14.8* 11.9*  BNP  --  1,261.8* 1,229.7*  SARSCOV2NAA NEGATIVE  --   --     ------------------------------------------------------------------------------------------------------------------ No results for input(s): CHOL, HDL, LDLCALC, TRIG, CHOLHDL, LDLDIRECT in the last 72 hours.  Lab Results  Component Value Date   HGBA1C 6.5 (H) 03/26/2020   ------------------------------------------------------------------------------------------------------------------ No results for input(s): TSH, T4TOTAL, T3FREE, THYROIDAB in the last 72 hours.  Invalid input(s): FREET3 ------------------------------------------------------------------------------------------------------------------ No results for input(s): VITAMINB12, FOLATE, FERRITIN, TIBC, IRON, RETICCTPCT in the last 72 hours.  Coagulation profile Recent Labs  Lab 03/26/20 0905  INR 1.0    No results for input(s): DDIMER in the last 72 hours.  Cardiac Enzymes No results for input(s): CKMB, TROPONINI, MYOGLOBIN in the last 168 hours.  Invalid input(s): CK ------------------------------------------------------------------------------------------------------------------    Component Value Date/Time   BNP 1,229.7 (H) 03/29/2020 9935    Micro Results Recent Results (from the past 240 hour(s))  Blood Culture (routine x 2)     Status: None (Preliminary result)   Collection Time: 03/26/20  8:48 AM   Specimen: BLOOD RIGHT HAND  Result Value Ref Range Status   Specimen Description BLOOD RIGHT HAND  Final   Special Requests   Final    BOTTLES DRAWN AEROBIC AND ANAEROBIC Blood Culture adequate volume   Culture   Final    NO GROWTH 3 DAYS Performed at [facility], [address], [place],  [place] [address]    Report Status PENDING  Incomplete  SARS Coronavirus 2 by RT PCR (hospital order, performed in [facility] hospital lab) Nasopharyngeal Nasopharyngeal Swab     Status: None   Collection Time: 03/26/20 11:38 AM   Specimen: Nasopharyngeal Swab  Result Value Ref Range Status   SARS Coronavirus 2 NEGATIVE NEGATIVE Final    Comment: (NOTE) SARS-CoV-2 target nucleic acids are NOT DETECTED.  The SARS-CoV-2 RNA is generally detectable in upper and lower respiratory specimens during the acute phase of infection. The lowest concentration of SARS-CoV-2 viral copies this assay can detect is 250 copies / mL. A negative result does not preclude SARS-CoV-2 infection and should not be used as the sole basis for treatment or other patient management decisions.  A negative result may occur with improper specimen collection / handling, submission of specimen other than nasopharyngeal swab, presence of viral mutation(s) within the areas targeted by this assay, and inadequate number of viral copies (<250 copies / mL). A negative result must be combined with clinical observations, patient history, and epidemiological information.  Fact Sheet for Patients:   StrictlyIdeas.no  Fact Sheet for Healthcare Providers: BankingDealers.co.za  This test is not yet approved or  cleared by the [place] FDA and has been authorized for detection  and/or diagnosis of SARS-CoV-2 by FDA under an Emergency Use Authorization (EUA).  This EUA will remain in effect (meaning this test can be used) for the duration of the COVID-19 declaration under Section 564(b)(1) of the Act, 21 U.S.C. section 360bbb-3(b)(1), unless the authorization is terminated or revoked sooner.  Performed at [facility], [place] [address], [place], [place] [address]   Surgical PCR screen     Status: None   Collection Time: 03/26/20  5:43 PM   Specimen: Nasal Mucosa; Nasal Swab   Result Value Ref Range Status   MRSA, PCR NEGATIVE NEGATIVE Final   Staphylococcus aureus NEGATIVE NEGATIVE Final    Comment: (NOTE) The Xpert SA Assay (FDA approved for NASAL specimens in patients 61 years of age and older), is one component of a comprehensive surveillance program. It is not intended to diagnose infection nor to guide or monitor treatment. Performed at [facility], [place] [address] [place], [place] [address]   Blood Culture (routine x 2)     Status: None (Preliminary result)   Collection Time: 03/26/20  6:44 PM   Specimen: BLOOD RIGHT HAND  Result Value Ref Range Status   Specimen Description BLOOD RIGHT HAND  Final   Special Requests   Final    BOTTLES DRAWN AEROBIC ONLY Blood Culture adequate volume   Culture   Final    NO GROWTH 3 DAYS Performed at [facility], [place] [address] [place], [place] [address]    Report Status PENDING  Incomplete  Aerobic/Anaerobic Culture (surgical/deep wound)     Status: None (Preliminary result)   Collection Time: 03/27/20 12:47 PM   Specimen: Toe, Right; Wound  Result Value Ref Range Status   Specimen Description WOUND  Final   Special Requests RIGHT FIFTH TOE SPEC A  Final   Gram Stain   Final    RARE WBC PRESENT,BOTH PMN AND MONONUCLEAR FEW GRAM NEGATIVE COCCOBACILLI    Culture   Final    NO GROWTH 2 DAYS Performed at [facility], [address], [place], [place] [address]    Report Status PENDING  Incomplete    Radiology Reports DG Ankle Complete Right  Result Date: 03/26/2020 CLINICAL DATA:  Diabetes with neuropathy. Evaluate for osteomyelitis. EXAM: RIGHT ANKLE - COMPLETE 3+ VIEW COMPARISON:  None. FINDINGS: Ankle mortise is normal. Mild diffuse decreased bone mineralization. Moderate degenerative changes over the midfoot and hindfoot as well as the tibiotalar joint. No evidence of bone destruction to suggest osteomyelitis. Spurring over the posterior and inferior calcaneus. IMPRESSION: No acute  findings. Electronically Signed   By: Marin Olp M.D.   On: 03/26/2020 10:49   MR FOOT RIGHT WO CONTRAST  Result Date: 03/27/2020 CLINICAL DATA:  Open wound of the fifth toe, question of osteomyelitis EXAM: MRI OF THE RIGHT FOREFOOT WITHOUT CONTRAST TECHNIQUE: Multiplanar, multisequence MR imaging of the right was performed. No intravenous contrast was administered. COMPARISON:  None. FINDINGS: Bones/Joint/Cartilage There is diffusely increased T2 signal with subtle T1 hypointensity on the plantar surface of the fifth proximal phalanx within overlying a area of ulceration. There is also increased T2 hyperintense signal without T1 hypointensity within the fifth metatarsal head, distal fifth phalanx, and fourth phalanges. Mild midfoot osteoarthritis seen with joint space loss and subchondral cystic changes. First MTP joint osteoarthritis is seen. Ligaments The Lisfranc ligaments appear to be intact. Muscles and Tendons Diffuse fatty atrophy seen within the muscles surrounding the forefoot. The flexor and extensor tendons appear  to be intact increased heterogeneous signal seen within the abductor digiti minimi tendon. Soft tissues There is a focal area of ulceration overlying the dorsal surface of the fifth metatarsal head with a loculated fluid collection measuring 1.4 x 0.7 x 1.5 cm. There is diffusely increased dorsal subcutaneous edema noted. IMPRESSION: 1. Area of ulceration on the dorsal surface of the fifth metatarsal head with a loculated fluid collection measuring 1.4 x 0.7 x 1.5 cm, consistent with subcutaneous abscess. 2. Findings suggestive of early osteomyelitis involving the fifth proximal phalanx. 3. Probable reactive marrow seen throughout the fifth metatarsal head, distal fifth phalanx and fourth phalanges. Electronically Signed   By: Prudencio Pair M.D.   On: 03/27/2020 00:58   DG Foot 2 Views Right  Result Date: 03/27/2020 CLINICAL DATA:  Right foot wound infection EXAM: RIGHT FOOT - 2 VIEW  COMPARISON:  MRI 03/27/2020, radiograph 03/26/2020 FINDINGS: Interval amputation of the fifth digit at the level of the mid metatarsal. Cut edges are smooth. Degenerative changes at the first MTP joint. Small plantar calcaneal spur. IMPRESSION: Interval amputation of the fifth digit at the level of the mid metatarsal. No acute osseous abnormality Electronically Signed   By: Donavan Foil M.D.   On: 03/27/2020 20:49   DG Foot Complete Right  Result Date: 03/26/2020 CLINICAL DATA:  History of diabetes and neuropathy. Open wound between fourth and fifth toes 1 week. Evaluate for osteomyelitis. EXAM: RIGHT FOOT COMPLETE - 3+ VIEW COMPARISON:  None. FINDINGS: Soft tissue wound over the plantar surface of the forefoot adjacent the fifth MTP joint. No significant air in the soft tissues. Slight lucency over the head of the fifth metatarsal on the AP view without discrete bone destruction. No other areas of bone destruction to suggest osteomyelitis. There is moderate degenerative change over the first MTP joint and midfoot/hindfoot. Spurring over the posterior and inferior calcaneus. IMPRESSION: 1. Soft tissue wound over the plantar aspect of the forefoot adjacent the fifth MTP joint. Subtle lucency over the head of the fifth metatarsal mildly suspicious for osteomyelitis. No discrete bone destruction. 2.  Moderate degenerative changes as described Electronically Signed   By: Marin Olp M.D.   On: 03/26/2020 10:41   DG Toe 5th Right  Result Date: 03/26/2020 CLINICAL DATA:  Toe infection EXAM: RIGHT FIFTH TOE COMPARISON:  None. FINDINGS: There is question of periosteal reaction seen with cortical step-off at the base of the distal fifth phalanx. Diffuse overlying soft tissue swelling with subcutaneous emphysema is seen. IMPRESSION: Cortical irregularity and periosteal reaction of the fifth distal phalanx which could be due to osteomyelitis. Overlying soft tissue swelling and subcutaneous emphysema. Electronically  Signed   By: Prudencio Pair M.D.   On: 03/26/2020 01:38    Time Spent in minutes  30   Lala Lund M.D on 03/29/2020 at 11:44 AM  To page go to [email or web address] - password [facility]

## 2020-03-29 NOTE — Progress Notes (Signed)
Subjective: POD #2 s/p right partial 5th ray amputation.  States he is doing well not having any pain and ready to go ahead and proceed with surgery.  Currently afebrile. Denies any systemic complaints such as fevers, chills, nausea, vomiting.   Objective: AAO x3, NAD DP/PT pulses palpable bilaterally, CRT less than 3 seconds to remaining digits S/p partial fifth ray amputation with a suture intact.  There is much improved edema and erythema to the foot.  Some residual erythema surrounding the dorsal MPJ area but overall much improved.  I removed her dressing today and granular wound base is present with small meta fibrotic tissue there is no purulence identified any signs of deep abscess.  The fourth toe with mild swelling there is no erythema or warmth.  No open sores on the fourth toe.  No signs of an abscess today. No pain with calf compression, swelling, warmth, erythema  Assessment: POD #2 s/p right partial 5th ray amputation  Plan: Continue IV antibiotics for now.  Change the dressing today and.  Wound with saline apply daily with dry dressing followed by dry sterile dressing.  At this time as he is clinically improving we discussed return to the operating room for wound debridement and likely delayed primary closure.  We discussed the surgery as well as all alternatives, risks, complications.  We discussed risks including, but not limited to, was delayed or nonhealing, spread of infection, further amputation, stroke, heart attack, death.  Discussed that with a months worth so she can hold off on amputation of the fourth toe.  Monitor closely.  Plan surgery tomorrow.  N.p.o. for midnight.  *Scheduled for 4:15 PM tomorrow.  I called the patient to make him aware.  He did not contact his sister as he will.  Celesta Gentile, DPM O: [phone] C: [phone]

## 2020-03-29 NOTE — Progress Notes (Signed)
Patient has hydralazine scheduled every 8 hours. Due to patient being in dialysis most of the day, patient did not receive his mid-day dose of hydralazine until 1702. Patient has another dose of hydralazine dose scheduled for 2200, but it was only 5 hours between the two doses. Pharmacy notified and Liane Comber from pharmacy informed me that it was okay to give the 2200 dose of hydralazine between 2330 and midnight before the patient becomes NPO at midnight. Will continue to monitor and treat per MD orders.

## 2020-03-30 ENCOUNTER — Encounter (HOSPITAL_COMMUNITY): Payer: Self-pay | Admitting: Internal Medicine

## 2020-03-30 ENCOUNTER — Inpatient Hospital Stay (HOSPITAL_COMMUNITY): Payer: Medicare Other

## 2020-03-30 ENCOUNTER — Inpatient Hospital Stay (HOSPITAL_COMMUNITY): Payer: Medicare Other | Admitting: Anesthesiology

## 2020-03-30 ENCOUNTER — Encounter (HOSPITAL_COMMUNITY)
Admission: EM | Disposition: A | Discharge status: Home Health | Payer: Self-pay | Source: Home / Self Care | Attending: Internal Medicine

## 2020-03-30 ENCOUNTER — Encounter (HOSPITAL_COMMUNITY): Payer: Medicare Other

## 2020-03-30 DIAGNOSIS — E11628 Type 2 diabetes mellitus with other skin complications: Secondary | ICD-10-CM

## 2020-03-30 DIAGNOSIS — L02611 Cutaneous abscess of right foot: Secondary | ICD-10-CM

## 2020-03-30 HISTORY — PX: WOUND DEBRIDEMENT: SHX247

## 2020-03-30 LAB — BASIC METABOLIC PANEL
Anion gap: 11 (ref 5–15)
BUN: 24 mg/dL — ABNORMAL HIGH (ref 6–20)
CO2: 26 mmol/L (ref 22–32)
Calcium: 8.9 mg/dL (ref 8.9–10.3)
Chloride: 97 mmol/L — ABNORMAL LOW (ref 98–111)
Creatinine, Ser: 4.72 mg/dL — ABNORMAL HIGH (ref 0.61–1.24)
GFR calc Af Amer: 15 mL/min — ABNORMAL LOW (ref >60)
GFR calc non Af Amer: 13 mL/min — ABNORMAL LOW (ref >60)
Glucose, Bld: 212 mg/dL — ABNORMAL HIGH (ref 70–99)
Potassium: 3.7 mmol/L (ref 3.5–5.1)
Sodium: 134 mmol/L — ABNORMAL LOW (ref 135–145)

## 2020-03-30 LAB — CBC WITH DIFFERENTIAL/PLATELET
Abs Immature Granulocytes: 0.25 10*3/uL — ABNORMAL HIGH (ref 0.00–0.07)
Basophils Absolute: 0.3 10*3/uL — ABNORMAL HIGH (ref 0.0–0.1)
Basophils Relative: 2 %
Eosinophils Absolute: 0.6 10*3/uL — ABNORMAL HIGH (ref 0.0–0.5)
Eosinophils Relative: 5 %
HCT: 37.6 % — ABNORMAL LOW (ref 39.0–52.0)
Hemoglobin: 12.3 g/dL — ABNORMAL LOW (ref 13.0–17.0)
Immature Granulocytes: 2 %
Lymphocytes Relative: 30 %
Lymphs Abs: 3.6 10*3/uL (ref 0.7–4.0)
MCH: 33.8 pg (ref 26.0–34.0)
MCHC: 32.7 g/dL (ref 30.0–36.0)
MCV: 103.3 fL — ABNORMAL HIGH (ref 80.0–100.0)
Monocytes Absolute: 2.1 10*3/uL — ABNORMAL HIGH (ref 0.1–1.0)
Monocytes Relative: 18 %
Neutro Abs: 5.2 10*3/uL (ref 1.7–7.7)
Neutrophils Relative %: 43 %
Platelets: 348 10*3/uL (ref 150–400)
RBC: 3.64 MIL/uL — ABNORMAL LOW (ref 4.22–5.81)
RDW: 12.2 % (ref 11.5–15.5)
WBC: 11.9 10*3/uL — ABNORMAL HIGH (ref 4.0–10.5)
nRBC: 0 % (ref 0.0–0.2)

## 2020-03-30 LAB — C-REACTIVE PROTEIN: CRP: 8.5 mg/dL — ABNORMAL HIGH (ref <1.0)

## 2020-03-30 LAB — GLUCOSE, CAPILLARY
Glucose-Capillary: 135 mg/dL — ABNORMAL HIGH (ref 70–99)
Glucose-Capillary: 143 mg/dL — ABNORMAL HIGH (ref 70–99)
Glucose-Capillary: 130 mg/dL — ABNORMAL HIGH (ref 70–99)
Glucose-Capillary: 347 mg/dL — ABNORMAL HIGH (ref 70–99)
Glucose-Capillary: 290 mg/dL — ABNORMAL HIGH (ref 70–99)

## 2020-03-30 LAB — BRAIN NATRIURETIC PEPTIDE: B Natriuretic Peptide: 1343.9 pg/mL — ABNORMAL HIGH (ref 0.0–100.0)

## 2020-03-30 LAB — PHOSPHORUS: Phosphorus: 2.6 mg/dL (ref 2.5–4.6)

## 2020-03-30 SURGERY — DEBRIDEMENT, WOUND
Anesthesia: Monitor Anesthesia Care | Site: Foot | Laterality: Right

## 2020-03-30 MED ORDER — ONDANSETRON HCL 4 MG/2ML IJ SOLN: 4.0000 mg | Freq: Once | INTRAMUSCULAR | Status: DC | PRN

## 2020-03-30 MED ORDER — ONDANSETRON HCL 4 MG/2ML IJ SOLN
INTRAMUSCULAR | Status: AC
  Filled 2020-03-30: qty 2

## 2020-03-30 MED ORDER — PHENYLEPHRINE 40 MCG/ML (10ML) SYRINGE FOR IV PUSH (FOR BLOOD PRESSURE SUPPORT)
PREFILLED_SYRINGE | INTRAVENOUS | Status: AC
  Filled 2020-03-30: qty 10

## 2020-03-30 MED ORDER — FENTANYL CITRATE (PF) 250 MCG/5ML IJ SOLN
INTRAMUSCULAR | Status: AC
  Filled 2020-03-30: qty 5

## 2020-03-30 MED ORDER — TOBRAMYCIN SULFATE 1.2 G IJ SOLR
INTRAMUSCULAR | Status: AC
  Filled 2020-03-30: qty 1.2

## 2020-03-30 MED ORDER — DEXAMETHASONE SODIUM PHOSPHATE 10 MG/ML IJ SOLN
INTRAMUSCULAR | Status: AC
  Filled 2020-03-30: qty 1

## 2020-03-30 MED ORDER — ONDANSETRON HCL 4 MG/2ML IJ SOLN
INTRAMUSCULAR | Status: DC | PRN
  Administered 2020-03-30: 4 mg via INTRAVENOUS

## 2020-03-30 MED ORDER — CHLORHEXIDINE GLUCONATE CLOTH 2 % EX PADS
6.0000 | MEDICATED_PAD | Freq: Every day | CUTANEOUS | Status: DC
  Administered 2020-04-01 – 2020-04-02 (×2): 6 via TOPICAL

## 2020-03-30 MED ORDER — LIDOCAINE HCL 2 % IJ SOLN
INTRAMUSCULAR | Status: DC | PRN
  Administered 2020-03-30: 8 mL

## 2020-03-30 MED ORDER — LIDOCAINE 2% (20 MG/ML) 5 ML SYRINGE
INTRAMUSCULAR | Status: DC | PRN
  Administered 2020-03-30: 40 mg via INTRAVENOUS

## 2020-03-30 MED ORDER — FENTANYL CITRATE (PF) 100 MCG/2ML IJ SOLN: 25.0000 ug | INTRAMUSCULAR | Status: DC | PRN

## 2020-03-30 MED ORDER — PROPOFOL 500 MG/50ML IV EMUL
INTRAVENOUS | Status: DC | PRN
  Administered 2020-03-30: 75 ug/kg/min via INTRAVENOUS

## 2020-03-30 MED ORDER — BUPIVACAINE HCL (PF) 0.5 % IJ SOLN
INTRAMUSCULAR | Status: DC | PRN
  Administered 2020-03-30: 12 mL

## 2020-03-30 MED ORDER — MIDAZOLAM HCL 5 MG/5ML IJ SOLN
INTRAMUSCULAR | Status: DC | PRN
  Administered 2020-03-30: 2 mg via INTRAVENOUS

## 2020-03-30 MED ORDER — DEXAMETHASONE SODIUM PHOSPHATE 4 MG/ML IJ SOLN
INTRAMUSCULAR | Status: DC | PRN
  Administered 2020-03-30: 4 mg via INTRAVENOUS

## 2020-03-30 MED ORDER — CHLORHEXIDINE GLUCONATE 0.12 % MT SOLN: 15.0000 mL | Freq: Once | OROMUCOSAL | Status: AC

## 2020-03-30 MED ORDER — SODIUM CHLORIDE 0.9 % IV SOLN: INTRAVENOUS | Status: DC | PRN

## 2020-03-30 MED ORDER — LIDOCAINE 2% (20 MG/ML) 5 ML SYRINGE
INTRAMUSCULAR | Status: AC
  Filled 2020-03-30: qty 5

## 2020-03-30 MED ORDER — CHLORHEXIDINE GLUCONATE 0.12 % MT SOLN
OROMUCOSAL | Status: AC
  Administered 2020-03-30: 15 mL via OROMUCOSAL
  Filled 2020-03-30: qty 15

## 2020-03-30 MED ORDER — LIDOCAINE HCL 2 % IJ SOLN
INTRAMUSCULAR | Status: AC
  Filled 2020-03-30: qty 40

## 2020-03-30 MED ORDER — FENTANYL CITRATE (PF) 100 MCG/2ML IJ SOLN
INTRAMUSCULAR | Status: DC | PRN
  Administered 2020-03-30: 50 ug via INTRAVENOUS

## 2020-03-30 MED ORDER — PROPOFOL 10 MG/ML IV BOLUS
INTRAVENOUS | Status: AC
  Filled 2020-03-30: qty 20

## 2020-03-30 MED ORDER — MIDAZOLAM HCL 2 MG/2ML IJ SOLN
INTRAMUSCULAR | Status: AC
  Filled 2020-03-30: qty 2

## 2020-03-30 MED ORDER — PROPOFOL 10 MG/ML IV BOLUS
INTRAVENOUS | Status: DC | PRN
  Administered 2020-03-30: 30 mg via INTRAVENOUS
  Administered 2020-03-30 (×2): 20 mg via INTRAVENOUS

## 2020-03-30 MED ORDER — SODIUM CHLORIDE 0.9 % IR SOLN
Status: DC | PRN
  Administered 2020-03-30: 1000 mL

## 2020-03-30 MED ORDER — ACETAMINOPHEN 10 MG/ML IV SOLN: 1000.0000 mg | Freq: Once | INTRAVENOUS | Status: DC | PRN

## 2020-03-30 MED ORDER — BUPIVACAINE HCL (PF) 0.5 % IJ SOLN
INTRAMUSCULAR | Status: AC
  Filled 2020-03-30: qty 30

## 2020-03-30 SURGICAL SUPPLY — 56 items
APL PRP STRL LF DISP 70% ISPRP (MISCELLANEOUS) ×1
BLADE SAW SGTL 83.5X18.5 (BLADE) ×1 IMPLANT
BLADE SURG 15 STRL LF DISP TIS (BLADE) ×3 IMPLANT
BLADE SURG 15 STRL SS (BLADE) ×6
BNDG CMPR 9X4 STRL LF SNTH (GAUZE/BANDAGES/DRESSINGS) ×1
BNDG COHESIVE 6X5 TAN STRL LF (GAUZE/BANDAGES/DRESSINGS) IMPLANT
BNDG ELASTIC 4X5.8 VLCR STR LF (GAUZE/BANDAGES/DRESSINGS) ×2 IMPLANT
BNDG ESMARK 4X9 LF (GAUZE/BANDAGES/DRESSINGS) ×3 IMPLANT
BNDG GAUZE ELAST 4 BULKY (GAUZE/BANDAGES/DRESSINGS) ×4 IMPLANT
BOWL SMART MIX CTS (DISPOSABLE) IMPLANT
CHLORAPREP W/TINT 26 (MISCELLANEOUS) ×3 IMPLANT
COVER SURGICAL LIGHT HANDLE (MISCELLANEOUS) ×3 IMPLANT
COVER WAND RF STERILE (DRAPES) ×1 IMPLANT
CUFF TOURN SGL QUICK 18X4 (TOURNIQUET CUFF) ×1 IMPLANT
CUFF TOURN SGL QUICK 34 (TOURNIQUET CUFF)
CUFF TRNQT CYL 34X4.125X (TOURNIQUET CUFF) ×1 IMPLANT
DRAPE OEC MINIVIEW 54X84 (DRAPES) ×1 IMPLANT
DRAPE U-SHAPE 47X51 STRL (DRAPES) ×3 IMPLANT
DRSG ADAPTIC 3X8 NADH LF (GAUZE/BANDAGES/DRESSINGS) ×2 IMPLANT
DRSG PAD ABDOMINAL 8X10 ST (GAUZE/BANDAGES/DRESSINGS) ×3 IMPLANT
ELECT CAUTERY BLADE 6.4 (BLADE) ×3 IMPLANT
ELECT REM PT RETURN 9FT ADLT (ELECTROSURGICAL)
ELECTRODE REM PT RTRN 9FT ADLT (ELECTROSURGICAL) IMPLANT
GAUZE SPONGE 4X4 12PLY STRL (GAUZE/BANDAGES/DRESSINGS) ×3 IMPLANT
GAUZE XEROFORM 1X8 LF (GAUZE/BANDAGES/DRESSINGS) ×3 IMPLANT
GLOVE BIO SURGEON STRL SZ8 (GLOVE) ×5 IMPLANT
GLOVE BIOGEL PI IND STRL 8 (GLOVE) ×1 IMPLANT
GLOVE BIOGEL PI INDICATOR 8 (GLOVE) ×2
GOWN STRL REUS W/ TWL LRG LVL3 (GOWN DISPOSABLE) ×2 IMPLANT
GOWN STRL REUS W/TWL LRG LVL3 (GOWN DISPOSABLE) ×6
HANDPIECE INTERPULSE COAX TIP (DISPOSABLE)
KIT BASIN OR (CUSTOM PROCEDURE TRAY) ×3 IMPLANT
KIT TURNOVER KIT B (KITS) ×3 IMPLANT
MANIFOLD NEPTUNE II (INSTRUMENTS) ×1 IMPLANT
NDL HYPO 25GX1X1/2 BEV (NEEDLE) ×1 IMPLANT
NEEDLE HYPO 25GX1X1/2 BEV (NEEDLE) ×3 IMPLANT
NS IRRIG 1000ML POUR BTL (IV SOLUTION) ×3 IMPLANT
PACK ORTHO EXTREMITY (CUSTOM PROCEDURE TRAY) ×3 IMPLANT
PAD ARMBOARD 7.5X6 YLW CONV (MISCELLANEOUS) ×6 IMPLANT
PAD CAST 4YDX4 CTTN HI CHSV (CAST SUPPLIES) ×1 IMPLANT
PADDING CAST COTTON 4X4 STRL (CAST SUPPLIES)
PADDING CAST COTTON 6X4 STRL (CAST SUPPLIES) ×1 IMPLANT
SET HNDPC FAN SPRY TIP SCT (DISPOSABLE) IMPLANT
SOL PREP POV-IOD 4OZ 10% (MISCELLANEOUS) ×6 IMPLANT
STAPLER VISISTAT (STAPLE) ×1 IMPLANT
STAPLER VISISTAT 35W (STAPLE) ×1 IMPLANT
STOCKINETTE IMPERVIOUS 9X36 MD (GAUZE/BANDAGES/DRESSINGS) ×1 IMPLANT
SUT PROLENE 3 0 PS 2 (SUTURE) ×7 IMPLANT
SWAB CULTURE LIQ STUART DBL (MISCELLANEOUS) ×1 IMPLANT
SWAB CULTURE LIQUID MINI MALE (MISCELLANEOUS) ×1 IMPLANT
SYR CONTROL 10ML LL (SYRINGE) ×3 IMPLANT
TOWEL GREEN STERILE (TOWEL DISPOSABLE) ×3 IMPLANT
TOWEL GREEN STERILE FF (TOWEL DISPOSABLE) ×3 IMPLANT
TUBE CONNECTING 12'X1/4 (SUCTIONS) ×1
TUBE CONNECTING 12X1/4 (SUCTIONS) ×2 IMPLANT
YANKAUER SUCT BULB TIP NO VENT (SUCTIONS) ×3 IMPLANT

## 2020-03-30 NOTE — Anesthesia Preprocedure Evaluation (Addendum)
Anesthesia Evaluation  Patient identified by MRN, date of birth, ID band Patient awake    Reviewed: Allergy & Precautions, NPO status , Patient's Chart, lab work & pertinent test results  Airway Mallampati: II  TM Distance: >3 FB Neck ROM: Full    Dental no notable dental hx.    Pulmonary Current Smoker and Patient abstained from smoking.,    Pulmonary exam normal breath sounds clear to auscultation       Cardiovascular hypertension, Pt. on medications Normal cardiovascular exam Rhythm:Regular Rate:Normal  Ecg: sr, rate 91    Neuro/Psych CVA, No Residual Symptoms negative psych ROS   GI/Hepatic Neg liver ROS, PUD,   Endo/Other  diabetes, Insulin Dependent  Renal/GU ESRF and DialysisRenal disease     Musculoskeletal  (+) Arthritis , Gout   Abdominal   Peds  Hematology  (+) anemia ,   Anesthesia Other Findings Right foot wound  Reproductive/Obstetrics                            Anesthesia Physical Anesthesia Plan  ASA: III  Anesthesia Plan: MAC   Post-op Pain Management:    Induction: Intravenous  PONV Risk Score and Plan: 0 and Ondansetron, Propofol infusion and Treatment may vary due to age or medical condition  Airway Management Planned: Simple Face Mask  Additional Equipment:   Intra-op Plan:   Post-operative Plan:   Informed Consent: I have reviewed the patients History and Physical, chart, labs and discussed the procedure including the risks, benefits and alternatives for the proposed anesthesia with the patient or authorized representative who has indicated his/her understanding and acceptance.     Dental advisory given  Plan Discussed with: CRNA  Anesthesia Plan Comments:        Anesthesia Quick Evaluation

## 2020-03-30 NOTE — Progress Notes (Signed)
PROGRESS NOTE                                                                                                                                                                                                             Patient Demographics:    Brian [name], is a 59 y.o. male, DOB - Jan 07, 1961, PRF:[id number]  Admit date - 03/26/2020   Admitting Physician Brian Bongo, MD  Outpatient Primary MD for the patient is Brian Downing, MD  LOS - 4  Chief Complaint  Patient presents with  . Wound Infection       Brief Narrative  TALEN POSER is a 59 y.o. male with medical history significant of CVA; HTN; DM; cryptogenic cirrhosis; and ESRD on HD presenting with a toe infection, work-up suggested right foot osteomyelitis, podiatry was consulted along with nephrology and he was admitted to the hospital.   Subjective:   Patient in bed, appears comfortable, denies any headache, no fever, no chest pain or pressure, no shortness of breath , no abdominal pain. No focal weakness.    Assessment  & Plan :     1.  Right foot infection with right fifth metatarsal head ulceration, abscess along with possible right fifth proximal phalanx and fifth toe osteomyelitis.  Currently on empiric antibiotics, inflammatory markers elevated, underwent ray amputation office right fifth toe on 03/27/2020, plan is to revisit OR in the next few days and continue IV antibiotics for now, prelim wound cultures growing gram-negative coccobacilli.  Follow inflammatory markers and culture results.  Recent Labs  Lab 03/26/20 0105 03/26/20 1138 03/27/20 0245 03/28/20 0648 03/29/20 0317 03/30/20 0350  WBC 20.0*  --  15.1* 12.2* 13.5* 11.9*  PLT 333  --  322 364 382 348  CRP  --  12.4*  --  14.8* 11.9* 8.5*    2.  ESRD.  On MWF schedule.  Renal on board.  3.  Ongoing tobacco abuse.  Counseled to quit.  4.  Essential hypertension - pressure high despite being on Norvasc, will add scheduled  hydralazine along with as needed hydralazine.  5.  DM type II with Karlene Lineman.  Sliding scale for now.  Lab Results  Component Value Date   HGBA1C 6.5 (H) 03/26/2020   CBG (last 3)  Recent Labs    03/29/20 1630 03/29/20 2212 03/30/20 0757  GLUCAP 114* 260* 135*      Condition - Fair  Family Communication  :  None  Code Status :  Full  Consults  :  Renal, Podiatry  Procedures  :    MRI R foot -  1. Area of ulceration on the dorsal surface of the fifth metatarsal head with a loculated fluid collection measuring 1.4 x 0.7 x 1.5 cm, consistent with subcutaneous abscess. 2. Findings suggestive of early osteomyelitis involving the fifth proximal phalanx. 3. Probable reactive marrow seen throughout the fifth metatarsal head, distal fifth phalanx and fourth phalanges.  Ray amputation of the right fifth toe on 03/27/2020 by podiatrist Dr. Mayo [name].    PUD Prophylaxis : PPI  Disposition Plan  :    Status is: Inpatient    Dispo: The patient is from: Home              Anticipated d/c is to: Home              Anticipated d/c date is: > 3 days              Patient currently is not medically stable to d/c.   DVT Prophylaxis  :   Heparin    Lab Results  Component Value Date   PLT 348 03/30/2020    Diet :  Diet Order            Diet NPO time specified  Diet effective midnight                  Inpatient Medications Scheduled Meds: . amLODipine  10 mg Oral Daily  . Chlorhexidine Gluconate Cloth  6 each Topical Q0600  . docusate sodium  100 mg Oral BID  . doxercalciferol  5 mcg Oral Q M,W,F-HD  . feeding supplement (NEPRO CARB STEADY)  237 mL Oral BID BM  . feeding supplement (PRO-STAT SUGAR FREE 64)  30 mL Oral BID  . ferric citrate  420 mg Oral TID WC  . heparin  5,000 Units Subcutaneous Q8H  . hydrALAZINE  50 mg Oral Q8H  . insulin aspart  0-6 Units Subcutaneous TID WC  . multivitamin  1 tablet Oral QHS  . nicotine  14 mg Transdermal Daily  . pantoprazole   20 mg Oral Daily   Continuous Infusions: . sodium chloride Stopped (03/29/20 0426)  . cefTRIAXone (ROCEPHIN)  IV 2 g (03/29/20 1708)  . metronidazole 500 mg (03/30/20 0259)  . vancomycin Stopped (03/29/20 1540)   PRN Meds:.acetaminophen **OR** acetaminophen, loperamide, ondansetron **OR** ondansetron (ZOFRAN) IV  Antibiotics  :   Anti-infectives (From admission, onward)   Start     Dose/Rate Route Frequency Ordered Stop   03/29/20 1438  vancomycin (VANCOCIN) 1-5 GM/200ML-% IVPB       Note to Pharmacy: Almira Bar   : cabinet override      03/29/20 1438 03/29/20 1445   03/27/20 1200  vancomycin (VANCOCIN) IVPB 1000 mg/200 mL premix     Discontinue     1,000 mg 200 mL/hr over 60 Minutes Intravenous Every M-W-F (Hemodialysis) 03/26/20 1047     03/26/20 1045  cefTRIAXone (ROCEPHIN) 2 g in sodium chloride 0.9 % 100 mL IVPB     Discontinue     2 g 200 mL/hr over 30 Minutes Intravenous Every 24 hours 03/26/20 1036     03/26/20 1045  metroNIDAZOLE (FLAGYL) IVPB 500 mg     Discontinue     500 mg 100 mL/hr over 60 Minutes Intravenous Every 8 hours 03/26/20 1036     03/26/20 0900  vancomycin (VANCOREADY) IVPB 1750 mg/350 mL        1,750  mg 175 mL/hr over 120 Minutes Intravenous  Once 03/26/20 0852 03/26/20 1450   03/26/20 0900  ceFEPIme (MAXIPIME) 2 g in sodium chloride 0.9 % 100 mL IVPB  Status:  Discontinued        2 g 200 mL/hr over 30 Minutes Intravenous Every 24 hours 03/26/20 0852 03/26/20 0854   03/26/20 0900  ceFEPIme (MAXIPIME) 1 g in sodium chloride 0.9 % 100 mL IVPB  Status:  Discontinued        1 g 200 mL/hr over 30 Minutes Intravenous Every 24 hours 03/26/20 0854 03/26/20 1036   03/26/20 0845  vancomycin (VANCOCIN) IVPB 1000 mg/200 mL premix  Status:  Discontinued        1,000 mg 200 mL/hr over 60 Minutes Intravenous  Once 03/26/20 0840 03/26/20 0852   03/26/20 0845  aztreonam (AZACTAM) 2 g in sodium chloride 0.9 % 100 mL IVPB  Status:  Discontinued        2 g 200 mL/hr  over 30 Minutes Intravenous  Once 03/26/20 0840 03/26/20 0852          Objective:   Vitals:   03/29/20 2218 03/29/20 2347 03/30/20 0308 03/30/20 0601  BP: (!) 161/94 (!) 167/97 (!) 151/97 (!) 150/87  Pulse: 88 86 83 85  Resp: 18   16  Temp: 98.8 F (37.1 C)   98.3 F (36.8 C)  TempSrc: Oral   Oral  SpO2: 97%   100%  Weight:      Height:        SpO2: 100 %  Wt Readings from Last 3 Encounters:  03/29/20 80.2 kg  03/07/20 84.8 kg  02/22/20 84.8 kg     Intake/Output Summary (Last 24 hours) at 03/30/2020 1020 Last data filed at 03/29/2020 2030 Gross per 24 hour  Intake 177 ml  Output 2000 ml  Net -1823 ml     Physical Exam  Awake Alert, No new F.N deficits, Normal affect [place].AT,PERRAL Supple Neck,No JVD, No cervical lymphadenopathy appriciated.  Symmetrical Chest wall movement, Good air movement bilaterally, CTAB RRR,No Gallops, Rubs or new Murmurs, No Parasternal Heave +ve B.Sounds, Abd Soft, No tenderness, No organomegaly appriciated, No rebound - guarding or rigidity. No Cyanosis, right foot remains under bandage   Data Review:    Recent Labs  Lab 03/26/20 0105 03/27/20 0245 03/28/20 0648 03/29/20 0317 03/30/20 0350  WBC 20.0* 15.1* 12.2* 13.5* 11.9*  HGB 13.1 11.8* 12.2* 12.4* 12.3*  HCT 39.5 35.3* 36.9* 37.5* 37.6*  PLT 333 322 364 382 348  MCV 102.9* 100.6* 102.2* 103.6* 103.3*  MCH 34.1* 33.6 33.8 34.3* 33.8  MCHC 33.2 33.4 33.1 33.1 32.7  RDW 12.3 11.9 12.1 12.2 12.2  LYMPHSABS  --   --  2.9 4.7* 3.6  MONOABS  --   --  2.5* 2.3* 2.1*  EOSABS  --   --  0.2 0.6* 0.6*  BASOSABS  --   --  0.2* 0.2* 0.3*    Recent Labs  Lab 03/26/20 0105 03/26/20 0905 03/26/20 1138 03/27/20 0245 03/28/20 0648 03/29/20 0317 03/30/20 0350  NA 125*  --   --  129* 135 135 134*  K 3.8  --   --  3.8 3.7 3.3* 3.7  CL 87*  --   --  90* 96* 97* 97*  CO2 23  --   --  24 27 24 26   GLUCOSE 145*  --   --  125* 170* 160* 212*  BUN 42*  --   --  62*  34* 50* 24*    CREATININE 6.97*  --   --  9.05* 6.36* 7.79* 4.72*  CALCIUM 8.9  --   --  8.9 9.0 9.2 8.9  AST  --  37  --   --   --   --   --   ALT  --  22  --   --   --   --   --   ALKPHOS  --  129*  --   --   --   --   --   BILITOT  --  2.0*  --   --   --   --   --   ALBUMIN  --  3.0*  --   --   --   --   --   CRP  --   --  12.4*  --  14.8* 11.9* 8.5*  INR  --  1.0  --   --   --   --   --   HGBA1C  --   --  6.5*  --   --   --   --   BNP  --   --   --   --  1,261.8* 1,229.7* 1,343.9*    Recent Labs  Lab 03/26/20 1138 03/28/20 0648 03/29/20 0317 03/30/20 0350  CRP 12.4* 14.8* 11.9* 8.5*  BNP  --  1,261.8* 1,229.7* 1,343.9*  SARSCOV2NAA NEGATIVE  --   --   --     ------------------------------------------------------------------------------------------------------------------ No results for input(s): CHOL, HDL, LDLCALC, TRIG, CHOLHDL, LDLDIRECT in the last 72 hours.  Lab Results  Component Value Date   HGBA1C 6.5 (H) 03/26/2020   ------------------------------------------------------------------------------------------------------------------ No results for input(s): TSH, T4TOTAL, T3FREE, THYROIDAB in the last 72 hours.  Invalid input(s): FREET3 ------------------------------------------------------------------------------------------------------------------ No results for input(s): VITAMINB12, FOLATE, FERRITIN, TIBC, IRON, RETICCTPCT in the last 72 hours.  Coagulation profile Recent Labs  Lab 03/26/20 0905  INR 1.0    No results for input(s): DDIMER in the last 72 hours.  Cardiac Enzymes No results for input(s): CKMB, TROPONINI, MYOGLOBIN in the last 168 hours.  Invalid input(s): CK ------------------------------------------------------------------------------------------------------------------    Component Value Date/Time   BNP 1,343.9 (H) 03/30/2020 0350    Micro Results Recent Results (from the past 240 hour(s))  Blood Culture (routine x 2)     Status: None  (Preliminary result)   Collection Time: 03/26/20  8:48 AM   Specimen: BLOOD RIGHT HAND  Result Value Ref Range Status   Specimen Description BLOOD RIGHT HAND  Final   Special Requests   Final    BOTTLES DRAWN AEROBIC AND ANAEROBIC Blood Culture adequate volume   Culture   Final    NO GROWTH 4 DAYS Performed at [facility], [address], [place],  [address]    Report Status PENDING  Incomplete  SARS Coronavirus 2 by RT PCR (hospital order, performed in [facility] hospital lab) Nasopharyngeal Nasopharyngeal Swab     Status: None   Collection Time: 03/26/20 11:38 AM   Specimen: Nasopharyngeal Swab  Result Value Ref Range Status   SARS Coronavirus 2 NEGATIVE NEGATIVE Final    Comment: (NOTE) SARS-CoV-2 target nucleic acids are NOT DETECTED.  The SARS-CoV-2 RNA is generally detectable in upper and lower respiratory specimens during the acute phase of infection. The lowest concentration of SARS-CoV-2 viral copies this assay can detect is 250 copies / mL. A negative result does not preclude SARS-CoV-2 infection and should not be used as the sole basis  for treatment or other patient management decisions.  A negative result may occur with improper specimen collection / handling, submission of specimen other than nasopharyngeal swab, presence of viral mutation(s) within the areas targeted by this assay, and inadequate number of viral copies (<250 copies / mL). A negative result must be combined with clinical observations, patient history, and epidemiological information.  Fact Sheet for Patients:   StrictlyIdeas.no  Fact Sheet for Healthcare Providers: BankingDealers.co.za  This test is not yet approved or  cleared by the [place] FDA and has been authorized for detection and/or diagnosis of SARS-CoV-2 by FDA under an Emergency Use Authorization (EUA).  This EUA will remain in effect (meaning this test can be used) for  the duration of the COVID-19 declaration under Section 564(b)(1) of the Act, 21 U.S.C. section 360bbb-3(b)(1), unless the authorization is terminated or revoked sooner.  Performed at [facility], [place] [address], [place], [place] [address]   Surgical PCR screen     Status: None   Collection Time: 03/26/20  5:43 PM   Specimen: Nasal Mucosa; Nasal Swab  Result Value Ref Range Status   MRSA, PCR NEGATIVE NEGATIVE Final   Staphylococcus aureus NEGATIVE NEGATIVE Final    Comment: (NOTE) The Xpert SA Assay (FDA approved for NASAL specimens in patients 40 years of age and older), is one component of a comprehensive surveillance program. It is not intended to diagnose infection nor to guide or monitor treatment. Performed at [facility], [place] [address] [place], [place] [address]   Blood Culture (routine x 2)     Status: None (Preliminary result)   Collection Time: 03/26/20  6:44 PM   Specimen: BLOOD RIGHT HAND  Result Value Ref Range Status   Specimen Description BLOOD RIGHT HAND  Final   Special Requests   Final    BOTTLES DRAWN AEROBIC ONLY Blood Culture adequate volume   Culture   Final    NO GROWTH 4 DAYS Performed at [facility], [place] [address], [place], [place] [address]    Report Status PENDING  Incomplete  Aerobic/Anaerobic Culture (surgical/deep wound)     Status: None (Preliminary result)   Collection Time: 03/27/20 12:47 PM   Specimen: Toe, Right; Wound  Result Value Ref Range Status   Specimen Description WOUND  Final   Special Requests RIGHT FIFTH TOE SPEC A  Final   Gram Stain   Final    RARE WBC PRESENT,BOTH PMN AND MONONUCLEAR FEW GRAM NEGATIVE COCCOBACILLI    Culture   Final    CULTURE REINCUBATED FOR BETTER GROWTH Performed at [facility], [place] [address] [place],  [address]    Report Status PENDING  Incomplete    Radiology Reports DG Ankle Complete Right  Result Date: 03/26/2020 CLINICAL DATA:  Diabetes with  neuropathy. Evaluate for osteomyelitis. EXAM: RIGHT ANKLE - COMPLETE 3+ VIEW COMPARISON:  None. FINDINGS: Ankle mortise is normal. Mild diffuse decreased bone mineralization. Moderate degenerative changes over the midfoot and hindfoot as well as the tibiotalar joint. No evidence of bone destruction to suggest osteomyelitis. Spurring over the posterior and inferior calcaneus. IMPRESSION: No acute findings. Electronically Signed   By: Marin Olp M.D.   On: 03/26/2020 10:49   MR FOOT RIGHT WO CONTRAST  Result Date: 03/27/2020 CLINICAL DATA:  Open wound of the fifth toe, question of osteomyelitis EXAM: MRI OF THE RIGHT FOREFOOT WITHOUT CONTRAST TECHNIQUE: Multiplanar, multisequence MR imaging of the right was performed. No intravenous contrast  was administered. COMPARISON:  None. FINDINGS: Bones/Joint/Cartilage There is diffusely increased T2 signal with subtle T1 hypointensity on the plantar surface of the fifth proximal phalanx within overlying a area of ulceration. There is also increased T2 hyperintense signal without T1 hypointensity within the fifth metatarsal head, distal fifth phalanx, and fourth phalanges. Mild midfoot osteoarthritis seen with joint space loss and subchondral cystic changes. First MTP joint osteoarthritis is seen. Ligaments The Lisfranc ligaments appear to be intact. Muscles and Tendons Diffuse fatty atrophy seen within the muscles surrounding the forefoot. The flexor and extensor tendons appear to be intact increased heterogeneous signal seen within the abductor digiti minimi tendon. Soft tissues There is a focal area of ulceration overlying the dorsal surface of the fifth metatarsal head with a loculated fluid collection measuring 1.4 x 0.7 x 1.5 cm. There is diffusely increased dorsal subcutaneous edema noted. IMPRESSION: 1. Area of ulceration on the dorsal surface of the fifth metatarsal head with a loculated fluid collection measuring 1.4 x 0.7 x 1.5 cm, consistent with subcutaneous  abscess. 2. Findings suggestive of early osteomyelitis involving the fifth proximal phalanx. 3. Probable reactive marrow seen throughout the fifth metatarsal head, distal fifth phalanx and fourth phalanges. Electronically Signed   By: Prudencio Pair M.D.   On: 03/27/2020 00:58   DG Foot 2 Views Right  Result Date: 03/27/2020 CLINICAL DATA:  Right foot wound infection EXAM: RIGHT FOOT - 2 VIEW COMPARISON:  MRI 03/27/2020, radiograph 03/26/2020 FINDINGS: Interval amputation of the fifth digit at the level of the mid metatarsal. Cut edges are smooth. Degenerative changes at the first MTP joint. Small plantar calcaneal spur. IMPRESSION: Interval amputation of the fifth digit at the level of the mid metatarsal. No acute osseous abnormality Electronically Signed   By: Donavan Foil M.D.   On: 03/27/2020 20:49   DG Foot Complete Right  Result Date: 03/26/2020 CLINICAL DATA:  History of diabetes and neuropathy. Open wound between fourth and fifth toes 1 week. Evaluate for osteomyelitis. EXAM: RIGHT FOOT COMPLETE - 3+ VIEW COMPARISON:  None. FINDINGS: Soft tissue wound over the plantar surface of the forefoot adjacent the fifth MTP joint. No significant air in the soft tissues. Slight lucency over the head of the fifth metatarsal on the AP view without discrete bone destruction. No other areas of bone destruction to suggest osteomyelitis. There is moderate degenerative change over the first MTP joint and midfoot/hindfoot. Spurring over the posterior and inferior calcaneus. IMPRESSION: 1. Soft tissue wound over the plantar aspect of the forefoot adjacent the fifth MTP joint. Subtle lucency over the head of the fifth metatarsal mildly suspicious for osteomyelitis. No discrete bone destruction. 2.  Moderate degenerative changes as described Electronically Signed   By: Marin Olp M.D.   On: 03/26/2020 10:41   DG Toe 5th Right  Result Date: 03/26/2020 CLINICAL DATA:  Toe infection EXAM: RIGHT FIFTH TOE COMPARISON:   None. FINDINGS: There is question of periosteal reaction seen with cortical step-off at the base of the distal fifth phalanx. Diffuse overlying soft tissue swelling with subcutaneous emphysema is seen. IMPRESSION: Cortical irregularity and periosteal reaction of the fifth distal phalanx which could be due to osteomyelitis. Overlying soft tissue swelling and subcutaneous emphysema. Electronically Signed   By: Prudencio Pair M.D.   On: 03/26/2020 01:38    Time Spent in minutes  30   Lala Lund M.D on 03/30/2020 at 10:20 AM  To page go to [email or web address] - password [email or web address]

## 2020-03-30 NOTE — Progress Notes (Addendum)
[facility] Progress Note   Subjective:   Pt seen in room. Feeling well, no concerns. Denies SOB, CP, palpitations, abdominal pain and nausea. For OR on foot later today - no issues with HD yesterday   Objective Vitals:   03/29/20 2218 03/29/20 2347 03/30/20 0308 03/30/20 0601  BP: (!) 161/94 (!) 167/97 (!) 151/97 (!) 150/87  Pulse: 88 86 83 85  Resp: 18   16  Temp: 98.8 F (37.1 C)   98.3 F (36.8 C)  TempSrc: Oral   Oral  SpO2: 97%   100%  Weight:      Height:       Physical Exam General: Well developed, well nourished male in NAD Heart: RRR, no murmur, normal S1/S2 Lungs: CTA b/l without wheezing, rhonchi or rales Abdomen: Soft, non-tender, non-distended. +BS Extremities: No edema b/l lower extremities, R foot wrapped Dialysis Access:  LUE AVF + thrill and bruit  Additional Objective Labs: Basic Metabolic Panel: Recent Labs  Lab 03/28/20 0648 03/29/20 0317 03/30/20 0350  NA 135 135 134*  K 3.7 3.3* 3.7  CL 96* 97* 97*  CO2 27 24 26   GLUCOSE 170* 160* 212*  BUN 34* 50* 24*  CREATININE 6.36* 7.79* 4.72*  CALCIUM 9.0 9.2 8.9  PHOS  --   --  2.6   Liver Function Tests: Recent Labs  Lab 03/26/20 0905  AST 37  ALT 22  ALKPHOS 129*  BILITOT 2.0*  PROT 6.8  ALBUMIN 3.0*   No results for input(s): LIPASE, AMYLASE in the last 168 hours. CBC: Recent Labs  Lab 03/26/20 0105 03/26/20 0105 03/27/20 0245 03/27/20 0245 03/28/20 0648 03/29/20 0317 03/30/20 0350  WBC 20.0*   < > 15.1*   < > 12.2* 13.5* 11.9*  NEUTROABS  --   --   --   --  6.3 5.4 5.2  HGB 13.1   < > 11.8*   < > 12.2* 12.4* 12.3*  HCT 39.5   < > 35.3*   < > 36.9* 37.5* 37.6*  MCV 102.9*  --  100.6*  --  102.2* 103.6* 103.3*  PLT 333   < > 322   < > 364 382 348   < > = values in this interval not displayed.   Blood Culture    Component Value Date/Time   SDES WOUND 03/27/2020 1247   SPECREQUEST RIGHT FIFTH TOE SPEC A 03/27/2020 1247   CULT  03/27/2020 1247    CULTURE  REINCUBATED FOR BETTER GROWTH Performed at [facility] [address] [place], [place] [address]    REPTSTATUS PENDING 03/27/2020 1247    Cardiac Enzymes: No results for input(s): CKTOTAL, CKMB, CKMBINDEX, TROPONINI in the last 168 hours. CBG: Recent Labs  Lab 03/29/20 0615 03/29/20 1143 03/29/20 1630 03/29/20 2212 03/30/20 0757  GLUCAP 131* 177* 114* 260* 135*   Iron Studies: No results for input(s): IRON, TIBC, TRANSFERRIN, FERRITIN in the last 72 hours. @lablastinr3 @ Studies/Results: No results found. Medications: . sodium chloride Stopped (03/29/20 0426)  . cefTRIAXone (ROCEPHIN)  IV 2 g (03/29/20 1708)  . metronidazole 500 mg (03/30/20 0259)  . vancomycin Stopped (03/29/20 1540)   . amLODipine  10 mg Oral Daily  . Chlorhexidine Gluconate Cloth  6 each Topical Q0600  . docusate sodium  100 mg Oral BID  . doxercalciferol  5 mcg Oral Q M,W,F-HD  . feeding supplement (NEPRO CARB STEADY)  237 mL Oral BID BM  . feeding supplement (PRO-STAT SUGAR FREE 64)  30  mL Oral BID  . ferric citrate  420 mg Oral TID WC  . heparin  5,000 Units Subcutaneous Q8H  . hydrALAZINE  50 mg Oral Q8H  . insulin aspart  0-6 Units Subcutaneous TID WC  . multivitamin  1 tablet Oral QHS  . nicotine  14 mg Transdermal Daily  . pantoprazole  20 mg Oral Daily    Dialysis Orders: [facility] MWF 4h 400/800 82kg 2/2 bath AVF L Hep 3000 - hect 5 - no esa  Assessment/Plan: 1.R diabetic foot infection/osteomyelitis:Seen by podiatry,s/p 5th ray amputation on 6/21.Plan to return to OR.On IV antibiotics.  2. ESRD:MWF schedule. K+ 3.7. No volume overload or uremic sx on exam.Continue MWF schedule via AVF. 3.HTN/volume:BP elevated. Euvolemic on exam and below outpatient EDW. Continue amlodipine, hydralazine started.   4. Anemia:Hgb12.4. No ESA/Fe indicated. 5. Secondary hyperparathyroidism:Calcium and phos at goal. Continue hectorol and auryxia. 6. Nutrition:NPO pre-op.  Renal diet/1.2Lfluid restrictions once eating 7.T2DM: Insulin per primary    Anice Paganini, PA-C 03/30/2020, 10:23 AM  [facility] Pager: [phone]  Patient seen and examined, agree with above note with above modifications. No c/o's-  For OR today again and is not sure about disposition after that.  Would be due routine HD tomorrow-   Corliss Parish, MD 03/30/2020

## 2020-03-30 NOTE — Transfer of Care (Signed)
Immediate Anesthesia Transfer of Care Note  Patient: Brian [name]  Procedure(s) Performed: DEBRIDEMENT WOUND RIGHT FOOT (Right Foot)  Patient Location: PACU  Anesthesia Type:MAC  Level of Consciousness: awake and alert   Airway & Oxygen Therapy: Patient Spontanous Breathing and Patient connected to nasal cannula oxygen  Post-op Assessment: Report given to RN and Post -op Vital signs reviewed and stable  Post vital signs: Reviewed and stable  Last Vitals:  Vitals Value Taken Time  BP 140/77 03/30/20 1319  Temp 36.3 C 03/30/20 1319  Pulse 80 03/30/20 1321  Resp 21 03/30/20 1321  SpO2 100 % 03/30/20 1321  Vitals shown include unvalidated device data.  Last Pain:  Vitals:   03/30/20 0900  TempSrc:   PainSc: 0-No pain         Complications: No complications documented.

## 2020-03-30 NOTE — Anesthesia Procedure Notes (Signed)
Procedure Name: MAC Date/Time: 03/30/2020 12:54 PM Performed by: Orlie Dakin, CRNA Pre-anesthesia Checklist: Patient identified, Emergency Drugs available, Suction available and Patient being monitored Patient Re-evaluated:Patient Re-evaluated prior to induction Oxygen Delivery Method: Nasal cannula Preoxygenation: Pre-oxygenation with 100% oxygen Induction Type: IV induction Placement Confirmation: positive ETCO2

## 2020-03-30 NOTE — Brief Op Note (Signed)
03/30/2020  1:16 PM  PATIENT:  Brian [name]  59 y.o. male  PRE-OPERATIVE DIAGNOSIS:  wound  POST-OPERATIVE DIAGNOSIS:  Wound  PROCEDURE:  Procedure(s): DEBRIDEMENT WOUND RIGHT FOOT (Right)  SURGEON:  Surgeon(s) and Role:    * Trula Slade, DPM - Primary  PHYSICIAN ASSISTANT:   ASSISTANTS: none   ANESTHESIA:   MAC  EBL:  15cc   BLOOD ADMINISTERED:none  DRAINS: none   LOCAL MEDICATIONS USED:  OTHER 20 cc lidocaine and marcaine plain  SPECIMEN:  No Specimen  DISPOSITION OF SPECIMEN:  N/A  COUNTS:  YES  TOURNIQUET:  * No tourniquets in log *  DICTATION: .Dragon Dictation  PLAN OF CARE: Admit to inpatient   PATIENT DISPOSITION:  PACU - hemodynamically stable.   Delay start of Pharmacological VTE agent (>24hrs) due to surgical blood loss or risk of bleeding: no

## 2020-03-30 NOTE — Anesthesia Postprocedure Evaluation (Signed)
Anesthesia Post Note  Patient: Brian [name]  Procedure(s) Performed: DEBRIDEMENT WOUND RIGHT FOOT (Right Foot)     Patient location during evaluation: PACU Anesthesia Type: MAC Level of consciousness: awake Pain management: pain level controlled Vital Signs Assessment: post-procedure vital signs reviewed and stable Respiratory status: spontaneous breathing, nonlabored ventilation, respiratory function stable and patient connected to nasal cannula oxygen Cardiovascular status: stable and blood pressure returned to baseline Postop Assessment: no apparent nausea or vomiting Anesthetic complications: no   No complications documented.  Last Vitals:  Vitals:   03/30/20 1340 03/30/20 1429  BP: (!) 143/87 (!) 168/88  Pulse: 70 78  Resp: 19 19  Temp: (!) 36.3 C 36.8 C  SpO2: 98% 98%    Last Pain:  Vitals:   03/30/20 1429  TempSrc: Oral  PainSc: 0-No pain                 Faye Sanfilippo P Krayton Wortley

## 2020-03-30 NOTE — Progress Notes (Signed)
Seen in pre-op. Scheduled for wound debridement and delayed closure of wound. He and his sister have no further questions. Again discussed the surgery and postop course. Discussed we need to monitor the 4th toe but clinically improved. Will plan as scheduled. NPO. Surgical consent signed.   Celesta Gentile, DPM O: [phone]

## 2020-03-31 ENCOUNTER — Inpatient Hospital Stay (HOSPITAL_COMMUNITY): Payer: Medicare Other

## 2020-03-31 DIAGNOSIS — L0390 Cellulitis, unspecified: Secondary | ICD-10-CM

## 2020-03-31 LAB — GLUCOSE, CAPILLARY
Glucose-Capillary: 203 mg/dL — ABNORMAL HIGH (ref 70–99)
Glucose-Capillary: 201 mg/dL — ABNORMAL HIGH (ref 70–99)
Glucose-Capillary: 224 mg/dL — ABNORMAL HIGH (ref 70–99)
Glucose-Capillary: 145 mg/dL — ABNORMAL HIGH (ref 70–99)
Glucose-Capillary: 229 mg/dL — ABNORMAL HIGH (ref 70–99)

## 2020-03-31 LAB — CBC WITH DIFFERENTIAL/PLATELET
Abs Immature Granulocytes: 0.23 10*3/uL — ABNORMAL HIGH (ref 0.00–0.07)
Basophils Absolute: 0.1 10*3/uL (ref 0.0–0.1)
Basophils Relative: 0 %
Eosinophils Absolute: 0 10*3/uL (ref 0.0–0.5)
Eosinophils Relative: 0 %
HCT: 36.1 % — ABNORMAL LOW (ref 39.0–52.0)
Hemoglobin: 11.8 g/dL — ABNORMAL LOW (ref 13.0–17.0)
Immature Granulocytes: 2 %
Lymphocytes Relative: 22 %
Lymphs Abs: 3.1 10*3/uL (ref 0.7–4.0)
MCH: 33.8 pg (ref 26.0–34.0)
MCHC: 32.7 g/dL (ref 30.0–36.0)
MCV: 103.4 fL — ABNORMAL HIGH (ref 80.0–100.0)
Monocytes Absolute: 2 10*3/uL — ABNORMAL HIGH (ref 0.1–1.0)
Monocytes Relative: 14 %
Neutro Abs: 8.8 10*3/uL — ABNORMAL HIGH (ref 1.7–7.7)
Neutrophils Relative %: 62 %
Platelets: 370 10*3/uL (ref 150–400)
RBC: 3.49 MIL/uL — ABNORMAL LOW (ref 4.22–5.81)
RDW: 12.5 % (ref 11.5–15.5)
WBC: 14.1 10*3/uL — ABNORMAL HIGH (ref 4.0–10.5)
nRBC: 0 % (ref 0.0–0.2)

## 2020-03-31 LAB — CULTURE, BLOOD (ROUTINE X 2)
Culture: NO GROWTH
Culture: NO GROWTH
Special Requests: ADEQUATE
Special Requests: ADEQUATE

## 2020-03-31 LAB — BASIC METABOLIC PANEL
Anion gap: 12 (ref 5–15)
BUN: 51 mg/dL — ABNORMAL HIGH (ref 6–20)
CO2: 24 mmol/L (ref 22–32)
Calcium: 9.1 mg/dL (ref 8.9–10.3)
Chloride: 95 mmol/L — ABNORMAL LOW (ref 98–111)
Creatinine, Ser: 7.66 mg/dL — ABNORMAL HIGH (ref 0.61–1.24)
GFR calc Af Amer: 8 mL/min — ABNORMAL LOW (ref >60)
GFR calc non Af Amer: 7 mL/min — ABNORMAL LOW (ref >60)
Glucose, Bld: 224 mg/dL — ABNORMAL HIGH (ref 70–99)
Potassium: 3.9 mmol/L (ref 3.5–5.1)
Sodium: 131 mmol/L — ABNORMAL LOW (ref 135–145)

## 2020-03-31 LAB — BRAIN NATRIURETIC PEPTIDE: B Natriuretic Peptide: 1522.3 pg/mL — ABNORMAL HIGH (ref 0.0–100.0)

## 2020-03-31 LAB — C-REACTIVE PROTEIN: CRP: 6.5 mg/dL — ABNORMAL HIGH (ref <1.0)

## 2020-03-31 MED ORDER — METRONIDAZOLE 500 MG PO TABS
500.0000 mg | ORAL_TABLET | Freq: Three times a day (TID) | ORAL | Status: DC
  Administered 2020-03-31 – 2020-04-02 (×6): 500 mg via ORAL
  Filled 2020-03-31 (×6): qty 1

## 2020-03-31 MED ORDER — VANCOMYCIN HCL IN DEXTROSE 1-5 GM/200ML-% IV SOLN
INTRAVENOUS | Status: AC
  Filled 2020-03-31: qty 200

## 2020-03-31 NOTE — Plan of Care (Signed)
Patient A&O x4. Free from falls. VSS. Patient ambulates independently with boot on. Dressing changed by MD this evening. Pt tolerating fluids and meals well. Patient went to dialysis 2.2 Liters removed, dressing intact on LU arm fistula. No c/o of pain. Bed wheels locked. Bed at the lowest position. Call and phone within reach.Pt resting, distress. POC reviewed.   Problem: Education: Goal: Knowledge of General Education information will improve Description: Including pain rating scale, medication(s)/side effects and non-pharmacologic comfort measures Outcome: Progressing   Problem: Health Behavior/Discharge Planning: Goal: Ability to manage health-related needs will improve Outcome: Progressing   Problem: Clinical Measurements: Goal: Ability to maintain clinical measurements within normal limits will improve Outcome: Progressing Goal: Will remain free from infection Outcome: Progressing Goal: Diagnostic test results will improve Outcome: Progressing Goal: Respiratory complications will improve Outcome: Progressing Goal: Cardiovascular complication will be avoided Outcome: Progressing   Problem: Activity: Goal: Risk for activity intolerance will decrease Outcome: Progressing   Problem: Nutrition: Goal: Adequate nutrition will be maintained Outcome: Progressing   Problem: Coping: Goal: Level of anxiety will decrease Outcome: Progressing   Problem: Elimination: Goal: Will not experience complications related to bowel motility Outcome: Progressing Goal: Will not experience complications related to urinary retention Outcome: Progressing   Problem: Pain Managment: Goal: General experience of comfort will improve Outcome: Progressing   Problem: Safety: Goal: Ability to remain free from injury will improve Outcome: Progressing   Problem: Skin Integrity: Goal: Risk for impaired skin integrity will decrease Outcome: Progressing

## 2020-03-31 NOTE — Progress Notes (Signed)
Pharmacy Antibiotic Note  Brian [name] is a 59 y.o. male admitted on 03/26/2020 with sepsis likely secondary to right toe infection.   S/p debridements 6/21 and 6/24  Patient is ESRD on HD MWF  Cultures growing gemella (likely sensitive to Ceftriaxone) and coccobacilli (awaiting ID)  Plan: Continue Vancomycin 1 gram iv Q HD - MWF - can likely stop as planned 6/26 Ceftriaxone 2 grams iv Q 24 hours Flagyl to po   Height: 5\' 9"  (175.3 cm) Weight: 86.4 kg (190 lb 7.6 oz) IBW/kg (Calculated) : 70.7  Temp (24hrs), Avg:97.8 F (36.6 C), Min:97.4 F (36.3 C), Max:98.3 F (36.8 C)  Recent Labs  Lab 03/26/20 0105 03/26/20 0848 03/27/20 0245 03/28/20 0648 03/29/20 0317 03/30/20 0350 03/31/20 0705  WBC   < >  --  15.1* 12.2* 13.5* 11.9* 14.1*  CREATININE   < >  --  9.05* 6.36* 7.79* 4.72* 7.66*  LATICACIDVEN  --  1.4  --   --   --   --   --    < > = values in this interval not displayed.    Estimated Creatinine Clearance: 11.6 mL/min (A) (by C-G formula based on SCr of 7.66 mg/dL (H)).    Allergies  Allergen Reactions  . Penicillins Hives and Rash    Has patient had a PCN reaction causing immediate rash, facial/tongue/throat swelling, SOB or lightheadedness with hypotension: Yes Has patient had a PCN reaction causing severe rash involving mucus membranes or skin necrosis: No Has patient had a PCN reaction that required hospitalization: No Has patient had a PCN reaction occurring within the last 10 years: No If all of the above answers are "NO", then may proceed with Cephalosporin use. Ceftriaxone = no issues  . Nsaids     Due to kidneys  . Aspirin Nausea And Vomiting    Other Reaction: GI Upset   Thank you Anette Guarneri, PharmD  03/31/2020 10:13 AM

## 2020-03-31 NOTE — Progress Notes (Signed)
Vascular ABI w/ TBI study completed.   See Cv Proc for preliminary results.   Brian [name]

## 2020-03-31 NOTE — Progress Notes (Addendum)
[facility] Progress Note   Subjective:   Pt seen in room. Had debridement of foot yesterday and ABI with TBI this AM. Wound cultures growing gram negative coccobacilli. Planned for HD today. Feeling well, denies SOB, CP, palpitations, abdominal pain, N/V/D. Seen in HD-  No c/o's   Objective Vitals:   03/30/20 1429 03/30/20 1808 03/30/20 2124 03/31/20 0534  BP: (!) 168/88 (!) 179/106 (!) 151/92 (!) 179/98  Pulse: 78  85 82  Resp: 19  19 19   Temp: 98.3 F (36.8 C)  97.8 F (36.6 C) 97.9 F (36.6 C)  TempSrc: Oral  Oral Oral  SpO2: 98%  100% 100%  Weight:    86.4 kg  Height:       Physical Exam General: Well developed, well nourished male in NAD Heart: RRR, no murmur, normal S1/S2 Lungs: CTA b/l without wheezing, rhonchi or rales Abdomen: Soft, non-tender, non-distended. +BS Extremities: No edema b/l lower extremities, R foot wrapped Dialysis Access:  LUE AVF + thrill and bruit  Additional Objective Labs: Basic Metabolic Panel: Recent Labs  Lab 03/29/20 0317 03/30/20 0350 03/31/20 0705  NA 135 134* 131*  K 3.3* 3.7 3.9  CL 97* 97* 95*  CO2 24 26 24   GLUCOSE 160* 212* 224*  BUN 50* 24* 51*  CREATININE 7.79* 4.72* 7.66*  CALCIUM 9.2 8.9 9.1  PHOS  --  2.6  --    Liver Function Tests: Recent Labs  Lab 03/26/20 0905  AST 37  ALT 22  ALKPHOS 129*  BILITOT 2.0*  PROT 6.8  ALBUMIN 3.0*   No results for input(s): LIPASE, AMYLASE in the last 168 hours. CBC: Recent Labs  Lab 03/27/20 0245 03/27/20 0245 03/28/20 0648 03/28/20 0648 03/29/20 0317 03/30/20 0350 03/31/20 0705  WBC 15.1*   < > 12.2*   < > 13.5* 11.9* 14.1*  NEUTROABS  --   --  6.3   < > 5.4 5.2 8.8*  HGB 11.8*   < > 12.2*   < > 12.4* 12.3* 11.8*  HCT 35.3*   < > 36.9*   < > 37.5* 37.6* 36.1*  MCV 100.6*  --  102.2*  --  103.6* 103.3* 103.4*  PLT 322   < > 364   < > 382 348 370   < > = values in this interval not displayed.   Blood Culture    Component Value Date/Time    SDES WOUND 03/27/2020 1247   SPECREQUEST RIGHT FIFTH TOE SPEC A 03/27/2020 1247   CULT  03/27/2020 1247    FEW GEMELLA MORBILLORUM Standardized susceptibility testing for this organism is not available. Performed at [facility], [place] [address] [place], [place] [address]    REPTSTATUS PENDING 03/27/2020 1247    CBG: Recent Labs  Lab 03/30/20 1105 03/30/20 1321 03/30/20 1701 03/30/20 2133 03/31/20 0747  GLUCAP 143* 130* 347* 290* 203*   Studies/Results: DG MINI C-ARM IMAGE ONLY  Result Date: 03/30/2020 There is no interpretation for this exam.  This order is for images obtained during a surgical procedure.  Please See "Surgeries" Tab for more information regarding the procedure.   VAS [place] ABI WITH/WO TBI  Result Date: 03/31/2020 LOWER EXTREMITY DOPPLER STUDY Indications: Ulceration. High Risk Factors: Hypertension, hyperlipidemia, Diabetes, prior CVA.  Limitations: Today's exam was limited due to involuntary patient movement. Comparison Study: No prior studies. Performing Technologist: Darlin Coco  Examination Guidelines: A complete evaluation includes at minimum, Doppler waveform signals and systolic blood pressure reading at the  level of bilateral brachial, anterior tibial, and posterior tibial arteries, when vessel segments are accessible. Bilateral testing is considered an integral part of a complete examination. Photoelectric Plethysmograph (PPG) waveforms and toe systolic pressure readings are included as required and additional duplex testing as needed. Limited examinations for reoccurring indications may be performed as noted.  ABI Findings: +---------+------------------+-----+---------+--------+ Right    Rt Pressure (mmHg)IndexWaveform Comment  +---------+------------------+-----+---------+--------+ Brachial 147                    triphasic         +---------+------------------+-----+---------+--------+ PTA      122               0.83 triphasic          +---------+------------------+-----+---------+--------+ DP       147               1.00 biphasic          +---------+------------------+-----+---------+--------+ Great Toe66                0.45 Abnormal          +---------+------------------+-----+---------+--------+ +---------+-----------------+-----+---------+---------------------------------+ Left     Lt Pressure      IndexWaveform Comment                                    (mmHg)                                                           +---------+-----------------+-----+---------+---------------------------------+ Brachial                       triphasicUnable to take pressure from LT                                           arm                               +---------+-----------------+-----+---------+---------------------------------+ PTA      179              1.22 triphasic                                  +---------+-----------------+-----+---------+---------------------------------+ DP       182              1.24 triphasic                                  +---------+-----------------+-----+---------+---------------------------------+ Great Toe122              0.83                                            +---------+-----------------+-----+---------+---------------------------------+ +-------+-----------+-----------+------------+------------+ ABI/TBIToday's ABIToday's TBIPrevious ABIPrevious TBI +-------+-----------+-----------+------------+------------+ Right  1.0        .45                                 +-------+-----------+-----------+------------+------------+  Left   1.24       .83                                 +-------+-----------+-----------+------------+------------+  Summary: Right: Resting right ankle-brachial index is within normal range. No evidence of significant right lower extremity arterial disease. WNL at [facility]. Mild at PT. Left: Resting left ankle-brachial index is  within normal range. No evidence of significant left lower extremity arterial disease.  *See table(s) above for measurements and observations.     Preliminary    Medications: . sodium chloride Stopped (03/30/20 1900)  . cefTRIAXone (ROCEPHIN)  IV 2 g (03/31/20 1037)  . vancomycin Stopped (03/29/20 1540)   . amLODipine  10 mg Oral Daily  . Chlorhexidine Gluconate Cloth  6 each Topical Q0600  . Chlorhexidine Gluconate Cloth  6 each Topical Q0600  . docusate sodium  100 mg Oral BID  . doxercalciferol  5 mcg Oral Q M,W,F-HD  . feeding supplement (NEPRO CARB STEADY)  237 mL Oral BID BM  . feeding supplement (PRO-STAT SUGAR FREE 64)  30 mL Oral BID  . ferric citrate  420 mg Oral TID WC  . heparin  5,000 Units Subcutaneous Q8H  . hydrALAZINE  50 mg Oral Q8H  . insulin aspart  0-6 Units Subcutaneous TID WC  . metroNIDAZOLE  500 mg Oral Q8H  . multivitamin  1 tablet Oral QHS  . nicotine  14 mg Transdermal Daily  . pantoprazole  20 mg Oral Daily    Dialysis Orders: [facility] MWF 4h 400/800 82kg 2/2 bath AVF L Hep 3000 - hect 5 - no esa  Assessment/Plan: 1.R diabetic foot infection/osteomyelitis:Seen by podiatry,s/p 5th ray amputation on 6/21.Had debridement of foot yesterday and ABI with TBI this AM. Wound cultures growing gram negative coccobacilli. On IVantibiotics.Can give vanc with HD but not rocephin 2. ESRD:MWF schedule. K+3.9.No volume overload or uremic sx on exam.HD today, Continue MWF schedule via AVF. 3.HTN/volume:BP elevated. Euvolemic on exam. Continue amlodipine and hydralazine.  4. Anemia:Hgb11.8. No ESA/Fe indicated. 5. Secondary hyperparathyroidism:Calcium and phos at goal. Continue hectorol and auryxia. 6. Nutrition:NPO pre-op. Renal diet/1.2Lfluid restrictions once eating 7.T2DM: Insulin per primary    Anice Paganini, PA-C 03/31/2020, 11:18 AM  [facility] Pager: [phone]  Patient seen and examined, agree with  above note with above modifications. Seen in HD-  No c/o's-  Not sure ti ming of discharge.  Of note, we can give vanc at HD and also ceftaz, ancef  or cefepime but not rocephin in case that is relevant  Corliss Parish, MD 03/31/2020

## 2020-03-31 NOTE — Procedures (Signed)
Patient was seen on dialysis and the procedure was supervised.  BFR 400  Via AVF BP is  154/91.   Patient appears to be tolerating treatment well  Louis Meckel 03/31/2020

## 2020-03-31 NOTE — Progress Notes (Signed)
Patient off unit, heading to dialysis.

## 2020-03-31 NOTE — Progress Notes (Signed)
Subjective: POD #1 s/p return to the operating room for right foot wound debridement, delayed primary closure.  States he is doing well on his me no pain.  He denies any fevers, chills, nausea, vomiting.  No calf pain, chest pain, shortness of breath he has no new concerns today.  Objective: AAO x3, NAD DP/PT pulses palpable bilaterally, CRT less than 3 seconds Status post partial 5th ray amputation.  Sutures intact and the very distal aspect is partially open.  Sutures intact but any evidence of dehiscence.  Faint erythema there is no significant warmth or foot swelling is much improved as well.  There is no areas of fluctuation crepitation.  Minimal swelling to the 4th toe and overall the toe is improving.  There is no significant erythema or warmth to the 4th toe. No pain with calf compression, swelling, warmth, erythema          Assessment: Postop day #1 status post right partial 5th ray amputation with delayed primary closure and wound debridement  Plan: Overall seems be doing well.  There is an increase in white blood cell count to evaluate this is in response to surgery/stress.  CRP is improved today.  Dressing was changed today.  Adaptic was applied followed by dry sterile dressing.  Continue Darco wedge shoe he can be weightbearing as tolerated in this.  Awaiting wound culture.  Likely discharge Saturday versus Sunday with oral antibiotics.  Waiting home health arrangements for 3 times a week dressing changes.  I will see him back in the office on Monday.  We will continue to follow while inpatient.  Celesta Gentile, DPM O: [phone] C: [phone]

## 2020-03-31 NOTE — Progress Notes (Signed)
PROGRESS NOTE                                                                                                                                                                                                             Patient Demographics:    Brian [name], is a 59 y.o. male, DOB - 1961-06-26, UMP:[id number]  Admit date - 03/26/2020   Admitting Physician [name], MD  Outpatient Primary MD for the patient is [name], MD  LOS - 5  Chief Complaint  Patient presents with  . Wound Infection       Brief Narrative  Brian [name] is a 59 y.o. male with medical history significant of CVA; HTN; DM; cryptogenic cirrhosis; and ESRD on HD presenting with a toe infection, work-up suggested right foot osteomyelitis, podiatry was consulted along with nephrology and he was admitted to the hospital.   Subjective:   Patient in bed, appears comfortable, denies any headache, no fever, no chest pain or pressure, no shortness of breath , no abdominal pain. No focal weakness.   Assessment  & Plan :     1.  Right foot infection with right fifth metatarsal head ulceration, abscess along with possible right fifth proximal phalanx and fifth toe osteomyelitis.  Currently on empiric antibiotics, inflammatory markers elevated, underwent ray amputation office right fifth toe on 03/27/2020, he had a second visit to the [facility] on 03/30/2020 with debridement of his wound, for now continue IV antibiotics , prelim wound cultures growing gram-negative coccobacilli.  Follow inflammatory markers and culture results.  If all stable will discontinue vancomycin on 04/01/2020 and continue Rocephin and Flagyl.  Recent Labs  Lab 03/26/20 0105 03/26/20 1138 03/27/20 0245 03/28/20 0648 03/29/20 0317 03/30/20 0350 03/31/20 0705  WBC   < >  --  15.1* 12.2* 13.5* 11.9* 14.1*  PLT   < >  --  322 364 382 348 370  CRP  --  12.4*  --  14.8* 11.9* 8.5*  --    < > = values in this interval not displayed.     2.  ESRD.  On MWF schedule.  Renal on board.  3.  Ongoing tobacco abuse.  Counseled to quit.  4.  Essential hypertension - pressure high despite being on Norvasc, will add scheduled hydralazine along with as needed hydralazine.  5.  DM type II with Karlene Lineman.  Sliding scale for now.  Lab Results  Component Value Date   HGBA1C 6.5 (H) 03/26/2020  CBG (last 3)  Recent Labs    03/30/20 1701 03/30/20 2133 03/31/20 0747  GLUCAP 347* 290* 203*      Condition - Fair  Family Communication  :  Aris Georgia [phone] 03/31/20  Code Status :  Full  Consults  :  Renal, Podiatry  Procedures  :    MRI R foot -  1. Area of ulceration on the dorsal surface of the fifth metatarsal head with a loculated fluid collection measuring 1.4 x 0.7 x 1.5 cm, consistent with subcutaneous abscess. 2. Findings suggestive of early osteomyelitis involving the fifth proximal phalanx. 3. Probable reactive marrow seen throughout the fifth metatarsal head, distal fifth phalanx and fourth phalanges.  Ray amputation of the right fifth toe on 03/27/2020 by podiatrist Dr. Mayo Ao.  DEBRIDEMENT WOUND RIGHT FOOT (Right) - 03/30/20   PUD Prophylaxis : PPI  Disposition Plan  :    Status is: Inpatient    Dispo: The patient is from: Home              Anticipated d/c is to: Home              Anticipated d/c date is: 1-2 days              Patient currently is not medically stable to d/c.   DVT Prophylaxis  :   Heparin    Lab Results  Component Value Date   PLT 370 03/31/2020    Diet :  Diet Order            Diet renal/carb modified with fluid restriction Diet-HS Snack? Nothing; Fluid restriction: 1200 mL Fluid; Room service appropriate? Yes; Fluid consistency: Thin  Diet effective now                  Inpatient Medications Scheduled Meds: . amLODipine  10 mg Oral Daily  . Chlorhexidine Gluconate Cloth  6 each Topical Q0600  . Chlorhexidine Gluconate Cloth  6 each Topical Q0600  .  docusate sodium  100 mg Oral BID  . doxercalciferol  5 mcg Oral Q M,W,F-HD  . feeding supplement (NEPRO CARB STEADY)  237 mL Oral BID BM  . feeding supplement (PRO-STAT SUGAR FREE 64)  30 mL Oral BID  . ferric citrate  420 mg Oral TID WC  . heparin  5,000 Units Subcutaneous Q8H  . hydrALAZINE  50 mg Oral Q8H  . insulin aspart  0-6 Units Subcutaneous TID WC  . multivitamin  1 tablet Oral QHS  . nicotine  14 mg Transdermal Daily  . pantoprazole  20 mg Oral Daily   Continuous Infusions: . sodium chloride Stopped (03/30/20 1900)  . cefTRIAXone (ROCEPHIN)  IV Stopped (03/30/20 1203)  . metronidazole 500 mg (03/31/20 0600)  . vancomycin Stopped (03/29/20 1540)   PRN Meds:.acetaminophen **OR** acetaminophen, loperamide, ondansetron **OR** ondansetron (ZOFRAN) IV  Antibiotics  :   Anti-infectives (From admission, onward)   Start     Dose/Rate Route Frequency Ordered Stop   03/29/20 1438  vancomycin (VANCOCIN) 1-5 GM/200ML-% IVPB       Note to Pharmacy: Almira Bar   : cabinet override      03/29/20 1438 03/29/20 1445   03/27/20 1200  vancomycin (VANCOCIN) IVPB 1000 mg/200 mL premix     Discontinue     1,000 mg 200 mL/hr over 60 Minutes Intravenous Every M-W-F (Hemodialysis) 03/26/20 1047     03/26/20 1045  cefTRIAXone (ROCEPHIN) 2 g in sodium chloride 0.9 % 100 mL IVPB  Discontinue     2 g 200 mL/hr over 30 Minutes Intravenous Every 24 hours 03/26/20 1036     03/26/20 1045  metroNIDAZOLE (FLAGYL) IVPB 500 mg     Discontinue     500 mg 100 mL/hr over 60 Minutes Intravenous Every 8 hours 03/26/20 1036     03/26/20 0900  vancomycin (VANCOREADY) IVPB 1750 mg/350 mL        1,750 mg 175 mL/hr over 120 Minutes Intravenous  Once 03/26/20 0852 03/26/20 1450   03/26/20 0900  ceFEPIme (MAXIPIME) 2 g in sodium chloride 0.9 % 100 mL IVPB  Status:  Discontinued        2 g 200 mL/hr over 30 Minutes Intravenous Every 24 hours 03/26/20 0852 03/26/20 0854   03/26/20 0900  ceFEPIme (MAXIPIME) 1  g in sodium chloride 0.9 % 100 mL IVPB  Status:  Discontinued        1 g 200 mL/hr over 30 Minutes Intravenous Every 24 hours 03/26/20 0854 03/26/20 1036   03/26/20 0845  vancomycin (VANCOCIN) IVPB 1000 mg/200 mL premix  Status:  Discontinued        1,000 mg 200 mL/hr over 60 Minutes Intravenous  Once 03/26/20 0840 03/26/20 0852   03/26/20 0845  aztreonam (AZACTAM) 2 g in sodium chloride 0.9 % 100 mL IVPB  Status:  Discontinued        2 g 200 mL/hr over 30 Minutes Intravenous  Once 03/26/20 0840 03/26/20 0852          Objective:   Vitals:   03/30/20 1429 03/30/20 1808 03/30/20 2124 03/31/20 0534  BP: (!) 168/88 (!) 179/106 (!) 151/92 (!) 179/98  Pulse: 78  85 82  Resp: 19  19 19   Temp: 98.3 F (36.8 C)  97.8 F (36.6 C) 97.9 F (36.6 C)  TempSrc: Oral  Oral Oral  SpO2: 98%  100% 100%  Weight:    86.4 kg  Height:        SpO2: 100 % O2 Flow Rate (L/min): 2 L/min  Wt Readings from Last 3 Encounters:  03/31/20 86.4 kg  03/07/20 84.8 kg  02/22/20 84.8 kg     Intake/Output Summary (Last 24 hours) at 03/31/2020 0809 Last data filed at 03/30/2020 2300 Gross per 24 hour  Intake 607.78 ml  Output 20 ml  Net 587.78 ml     Physical Exam  Awake Alert, No new F.N deficits, Normal affect [place].AT,PERRAL Supple Neck,No JVD, No cervical lymphadenopathy appriciated.  Symmetrical Chest wall movement, Good air movement bilaterally, CTAB RRR,No Gallops, Rubs or new Murmurs, No Parasternal Heave +ve B.Sounds, Abd Soft, No tenderness, No organomegaly appriciated, No rebound - guarding or rigidity. No Cyanosis, right foot remains under bandage   Data Review:    Recent Labs  Lab 03/27/20 0245 03/28/20 0648 03/29/20 0317 03/30/20 0350 03/31/20 0705  WBC 15.1* 12.2* 13.5* 11.9* 14.1*  HGB 11.8* 12.2* 12.4* 12.3* 11.8*  HCT 35.3* 36.9* 37.5* 37.6* 36.1*  PLT 322 364 382 348 370  MCV 100.6* 102.2* 103.6* 103.3* 103.4*  MCH 33.6 33.8 34.3* 33.8 33.8  MCHC 33.4 33.1 33.1 32.7  32.7  RDW 11.9 12.1 12.2 12.2 12.5  LYMPHSABS  --  2.9 4.7* 3.6 3.1  MONOABS  --  2.5* 2.3* 2.1* 2.0*  EOSABS  --  0.2 0.6* 0.6* 0.0  BASOSABS  --  0.2* 0.2* 0.3* 0.1    Recent Labs  Lab 03/26/20 0105 03/26/20 0905 03/26/20 1138 03/27/20 0245 03/28/20 7824 03/29/20 0317 03/30/20  0350  NA 125*  --   --  129* 135 135 134*  K 3.8  --   --  3.8 3.7 3.3* 3.7  CL 87*  --   --  90* 96* 97* 97*  CO2 23  --   --  24 27 24 26   GLUCOSE 145*  --   --  125* 170* 160* 212*  BUN 42*  --   --  62* 34* 50* 24*  CREATININE 6.97*  --   --  9.05* 6.36* 7.79* 4.72*  CALCIUM 8.9  --   --  8.9 9.0 9.2 8.9  AST  --  37  --   --   --   --   --   ALT  --  22  --   --   --   --   --   ALKPHOS  --  129*  --   --   --   --   --   BILITOT  --  2.0*  --   --   --   --   --   ALBUMIN  --  3.0*  --   --   --   --   --   CRP  --   --  12.4*  --  14.8* 11.9* 8.5*  INR  --  1.0  --   --   --   --   --   HGBA1C  --   --  6.5*  --   --   --   --   BNP  --   --   --   --  1,261.8* 1,229.7* 1,343.9*    Recent Labs  Lab 03/26/20 1138 03/28/20 0648 03/29/20 0317 03/30/20 0350  CRP 12.4* 14.8* 11.9* 8.5*  BNP  --  1,261.8* 1,229.7* 1,343.9*  SARSCOV2NAA NEGATIVE  --   --   --     ------------------------------------------------------------------------------------------------------------------ No results for input(s): CHOL, HDL, LDLCALC, TRIG, CHOLHDL, LDLDIRECT in the last 72 hours.  Lab Results  Component Value Date   HGBA1C 6.5 (H) 03/26/2020   ------------------------------------------------------------------------------------------------------------------ No results for input(s): TSH, T4TOTAL, T3FREE, THYROIDAB in the last 72 hours.  Invalid input(s): FREET3 ------------------------------------------------------------------------------------------------------------------ No results for input(s): VITAMINB12, FOLATE, FERRITIN, TIBC, IRON, RETICCTPCT in the last 72 hours.  Coagulation  profile Recent Labs  Lab 03/26/20 0905  INR 1.0    No results for input(s): DDIMER in the last 72 hours.  Cardiac Enzymes No results for input(s): CKMB, TROPONINI, MYOGLOBIN in the last 168 hours.  Invalid input(s): CK ------------------------------------------------------------------------------------------------------------------    Component Value Date/Time   BNP 1,343.9 (H) 03/30/2020 0350    Micro Results Recent Results (from the past 240 hour(s))  Blood Culture (routine x 2)     Status: None (Preliminary result)   Collection Time: 03/26/20  8:48 AM   Specimen: BLOOD RIGHT HAND  Result Value Ref Range Status   Specimen Description BLOOD RIGHT HAND  Final   Special Requests   Final    BOTTLES DRAWN AEROBIC AND ANAEROBIC Blood Culture adequate volume   Culture   Final    NO GROWTH 4 DAYS Performed at [facility], [address], [place], [place] [address]    Report Status PENDING  Incomplete  SARS Coronavirus 2 by RT PCR (hospital order, performed in [facility] hospital lab) Nasopharyngeal Nasopharyngeal Swab     Status: None   Collection Time: 03/26/20 11:38 AM   Specimen: Nasopharyngeal Swab  Result Value Ref  Range Status   SARS Coronavirus 2 NEGATIVE NEGATIVE Final    Comment: (NOTE) SARS-CoV-2 target nucleic acids are NOT DETECTED.  The SARS-CoV-2 RNA is generally detectable in upper and lower respiratory specimens during the acute phase of infection. The lowest concentration of SARS-CoV-2 viral copies this assay can detect is 250 copies / mL. A negative result does not preclude SARS-CoV-2 infection and should not be used as the sole basis for treatment or other patient management decisions.  A negative result may occur with improper specimen collection / handling, submission of specimen other than nasopharyngeal swab, presence of viral mutation(s) within the areas targeted by this assay, and inadequate number of viral copies (<250 copies / mL). A  negative result must be combined with clinical observations, patient history, and epidemiological information.  Fact Sheet for Patients:   StrictlyIdeas.no  Fact Sheet for Healthcare Providers: BankingDealers.co.za  This test is not yet approved or  cleared by the [place] FDA and has been authorized for detection and/or diagnosis of SARS-CoV-2 by FDA under an Emergency Use Authorization (EUA).  This EUA will remain in effect (meaning this test can be used) for the duration of the COVID-19 declaration under Section 564(b)(1) of the Act, 21 U.S.C. section 360bbb-3(b)(1), unless the authorization is terminated or revoked sooner.  Performed at [facility], [place] [address] [place], [place] [address]   Surgical PCR screen     Status: None   Collection Time: 03/26/20  5:43 PM   Specimen: Nasal Mucosa; Nasal Swab  Result Value Ref Range Status   MRSA, PCR NEGATIVE NEGATIVE Final   Staphylococcus aureus NEGATIVE NEGATIVE Final    Comment: (NOTE) The Xpert SA Assay (FDA approved for NASAL specimens in patients 80 years of age and older), is one component of a comprehensive surveillance program. It is not intended to diagnose infection nor to guide or monitor treatment. Performed at [facility], [place] [address], [place], [place] [address]   Blood Culture (routine x 2)     Status: None (Preliminary result)   Collection Time: 03/26/20  6:44 PM   Specimen: BLOOD RIGHT HAND  Result Value Ref Range Status   Specimen Description BLOOD RIGHT HAND  Final   Special Requests   Final    BOTTLES DRAWN AEROBIC ONLY Blood Culture adequate volume   Culture   Final    NO GROWTH 4 DAYS Performed at [facility], [place] [address] [place], [place] [address]    Report Status PENDING  Incomplete  Aerobic/Anaerobic Culture (surgical/deep wound)     Status: None (Preliminary result)   Collection Time: 03/27/20 12:47 PM   Specimen:  Toe, Right; Wound  Result Value Ref Range Status   Specimen Description WOUND  Final   Special Requests RIGHT FIFTH TOE SPEC A  Final   Gram Stain   Final    RARE WBC PRESENT,BOTH PMN AND MONONUCLEAR FEW GRAM NEGATIVE COCCOBACILLI Performed at [facility], [place] [address], [place],  [address]    Culture FEW GEMELLA MORBILLORUM  Final   Report Status PENDING  Incomplete    Radiology Reports DG Ankle Complete Right  Result Date: 03/26/2020 CLINICAL DATA:  Diabetes with neuropathy. Evaluate for osteomyelitis. EXAM: RIGHT ANKLE - COMPLETE 3+ VIEW COMPARISON:  None. FINDINGS: Ankle mortise is normal. Mild diffuse decreased bone mineralization. Moderate degenerative changes over the midfoot and hindfoot as well as the tibiotalar joint. No evidence of bone destruction to suggest osteomyelitis. Spurring over  the posterior and inferior calcaneus. IMPRESSION: No acute findings. Electronically Signed   By: Marin Olp M.D.   On: 03/26/2020 10:49   MR FOOT RIGHT WO CONTRAST  Result Date: 03/27/2020 CLINICAL DATA:  Open wound of the fifth toe, question of osteomyelitis EXAM: MRI OF THE RIGHT FOREFOOT WITHOUT CONTRAST TECHNIQUE: Multiplanar, multisequence MR imaging of the right was performed. No intravenous contrast was administered. COMPARISON:  None. FINDINGS: Bones/Joint/Cartilage There is diffusely increased T2 signal with subtle T1 hypointensity on the plantar surface of the fifth proximal phalanx within overlying a area of ulceration. There is also increased T2 hyperintense signal without T1 hypointensity within the fifth metatarsal head, distal fifth phalanx, and fourth phalanges. Mild midfoot osteoarthritis seen with joint space loss and subchondral cystic changes. First MTP joint osteoarthritis is seen. Ligaments The Lisfranc ligaments appear to be intact. Muscles and Tendons Diffuse fatty atrophy seen within the muscles surrounding the forefoot. The flexor and extensor tendons appear  to be intact increased heterogeneous signal seen within the abductor digiti minimi tendon. Soft tissues There is a focal area of ulceration overlying the dorsal surface of the fifth metatarsal head with a loculated fluid collection measuring 1.4 x 0.7 x 1.5 cm. There is diffusely increased dorsal subcutaneous edema noted. IMPRESSION: 1. Area of ulceration on the dorsal surface of the fifth metatarsal head with a loculated fluid collection measuring 1.4 x 0.7 x 1.5 cm, consistent with subcutaneous abscess. 2. Findings suggestive of early osteomyelitis involving the fifth proximal phalanx. 3. Probable reactive marrow seen throughout the fifth metatarsal head, distal fifth phalanx and fourth phalanges. Electronically Signed   By: Prudencio Pair M.D.   On: 03/27/2020 00:58   DG Foot 2 Views Right  Result Date: 03/27/2020 CLINICAL DATA:  Right foot wound infection EXAM: RIGHT FOOT - 2 VIEW COMPARISON:  MRI 03/27/2020, radiograph 03/26/2020 FINDINGS: Interval amputation of the fifth digit at the level of the mid metatarsal. Cut edges are smooth. Degenerative changes at the first MTP joint. Small plantar calcaneal spur. IMPRESSION: Interval amputation of the fifth digit at the level of the mid metatarsal. No acute osseous abnormality Electronically Signed   By: Donavan Foil M.D.   On: 03/27/2020 20:49   DG Foot Complete Right  Result Date: 03/26/2020 CLINICAL DATA:  History of diabetes and neuropathy. Open wound between fourth and fifth toes 1 week. Evaluate for osteomyelitis. EXAM: RIGHT FOOT COMPLETE - 3+ VIEW COMPARISON:  None. FINDINGS: Soft tissue wound over the plantar surface of the forefoot adjacent the fifth MTP joint. No significant air in the soft tissues. Slight lucency over the head of the fifth metatarsal on the AP view without discrete bone destruction. No other areas of bone destruction to suggest osteomyelitis. There is moderate degenerative change over the first MTP joint and midfoot/hindfoot.  Spurring over the posterior and inferior calcaneus. IMPRESSION: 1. Soft tissue wound over the plantar aspect of the forefoot adjacent the fifth MTP joint. Subtle lucency over the head of the fifth metatarsal mildly suspicious for osteomyelitis. No discrete bone destruction. 2.  Moderate degenerative changes as described Electronically Signed   By: Marin Olp M.D.   On: 03/26/2020 10:41   DG Toe 5th Right  Result Date: 03/26/2020 CLINICAL DATA:  Toe infection EXAM: RIGHT FIFTH TOE COMPARISON:  None. FINDINGS: There is question of periosteal reaction seen with cortical step-off at the base of the distal fifth phalanx. Diffuse overlying soft tissue swelling with subcutaneous emphysema is seen. IMPRESSION: Cortical irregularity and periosteal reaction  of the fifth distal phalanx which could be due to osteomyelitis. Overlying soft tissue swelling and subcutaneous emphysema. Electronically Signed   By: Prudencio Pair M.D.   On: 03/26/2020 01:38   DG MINI C-ARM IMAGE ONLY  Result Date: 03/30/2020 There is no interpretation for this exam.  This order is for images obtained during a surgical procedure.  Please See "Surgeries" Tab for more information regarding the procedure.    Time Spent in minutes  30   Lala Lund M.D on 03/31/2020 at 8:09 AM  To page go to [email or web address] - password [email or web address]

## 2020-03-31 NOTE — Progress Notes (Signed)
Patient off unit at vascular ultrasound

## 2020-04-01 LAB — CBC WITH DIFFERENTIAL/PLATELET
Abs Immature Granulocytes: 0.23 10*3/uL — ABNORMAL HIGH (ref 0.00–0.07)
Basophils Absolute: 0.1 10*3/uL (ref 0.0–0.1)
Basophils Relative: 1 %
Eosinophils Absolute: 0.3 10*3/uL (ref 0.0–0.5)
Eosinophils Relative: 2 %
HCT: 37.1 % — ABNORMAL LOW (ref 39.0–52.0)
Hemoglobin: 12.1 g/dL — ABNORMAL LOW (ref 13.0–17.0)
Immature Granulocytes: 2 %
Lymphocytes Relative: 26 %
Lymphs Abs: 3.4 10*3/uL (ref 0.7–4.0)
MCH: 33.8 pg (ref 26.0–34.0)
MCHC: 32.6 g/dL (ref 30.0–36.0)
MCV: 103.6 fL — ABNORMAL HIGH (ref 80.0–100.0)
Monocytes Absolute: 1.7 10*3/uL — ABNORMAL HIGH (ref 0.1–1.0)
Monocytes Relative: 13 %
Neutro Abs: 7.4 10*3/uL (ref 1.7–7.7)
Neutrophils Relative %: 56 %
Platelets: 327 10*3/uL (ref 150–400)
RBC: 3.58 MIL/uL — ABNORMAL LOW (ref 4.22–5.81)
RDW: 12.6 % (ref 11.5–15.5)
WBC: 13.1 10*3/uL — ABNORMAL HIGH (ref 4.0–10.5)
nRBC: 0 % (ref 0.0–0.2)

## 2020-04-01 LAB — GLUCOSE, CAPILLARY
Glucose-Capillary: 144 mg/dL — ABNORMAL HIGH (ref 70–99)
Glucose-Capillary: 171 mg/dL — ABNORMAL HIGH (ref 70–99)
Glucose-Capillary: 184 mg/dL — ABNORMAL HIGH (ref 70–99)
Glucose-Capillary: 233 mg/dL — ABNORMAL HIGH (ref 70–99)

## 2020-04-01 LAB — BASIC METABOLIC PANEL
Anion gap: 11 (ref 5–15)
BUN: 24 mg/dL — ABNORMAL HIGH (ref 6–20)
CO2: 26 mmol/L (ref 22–32)
Calcium: 9.1 mg/dL (ref 8.9–10.3)
Chloride: 98 mmol/L (ref 98–111)
Creatinine, Ser: 5.18 mg/dL — ABNORMAL HIGH (ref 0.61–1.24)
GFR calc Af Amer: 13 mL/min — ABNORMAL LOW (ref >60)
GFR calc non Af Amer: 11 mL/min — ABNORMAL LOW (ref >60)
Glucose, Bld: 228 mg/dL — ABNORMAL HIGH (ref 70–99)
Potassium: 3.8 mmol/L (ref 3.5–5.1)
Sodium: 135 mmol/L (ref 135–145)

## 2020-04-01 LAB — AEROBIC/ANAEROBIC CULTURE W GRAM STAIN (SURGICAL/DEEP WOUND)

## 2020-04-01 LAB — MAGNESIUM: Magnesium: 1.9 mg/dL (ref 1.7–2.4)

## 2020-04-01 LAB — BRAIN NATRIURETIC PEPTIDE: B Natriuretic Peptide: 1394.5 pg/mL — ABNORMAL HIGH (ref 0.0–100.0)

## 2020-04-01 LAB — C-REACTIVE PROTEIN: CRP: 4.2 mg/dL — ABNORMAL HIGH (ref <1.0)

## 2020-04-01 MED ORDER — ATORVASTATIN CALCIUM 40 MG PO TABS
40.0000 mg | ORAL_TABLET | Freq: Every day | ORAL | Status: DC
  Administered 2020-04-01 – 2020-04-02 (×2): 40 mg via ORAL
  Filled 2020-04-01 (×2): qty 1

## 2020-04-01 MED ORDER — HYDRALAZINE HCL 50 MG PO TABS
100.0000 mg | ORAL_TABLET | Freq: Three times a day (TID) | ORAL | Status: DC
  Administered 2020-04-01 – 2020-04-02 (×3): 100 mg via ORAL
  Filled 2020-04-01 (×3): qty 2

## 2020-04-01 MED ORDER — ASPIRIN 81 MG PO CHEW
81.0000 mg | CHEWABLE_TABLET | Freq: Every day | ORAL | Status: DC
  Filled 2020-04-01 (×2): qty 1

## 2020-04-01 NOTE — Progress Notes (Signed)
Patient refused scheduled aspirin. Patient states he's been told in the past not to take it due to bleeding ulcers. Provided education however patient adamantly refused. Dr. Candiss Norse made aware.  Hiram Comber, RN 04/01/2020 12:31 PM

## 2020-04-01 NOTE — Progress Notes (Signed)
Subjective: POD #2 s/p return to the operating room for right foot wound debridement, delayed primary closure. States he is doing well, no pain. He is ready to go home. Denies any fevers, chills, nausea, vomiting.  No calf pain, chest pain, shortness of breath he has no new concerns today.  Objective: AAO x3, NAD DP/PT pulses palpable bilaterally, CRT less than 3 seconds Status post partial 5th ray amputation.  Sutures intact and the very distal aspect is partially open.  Sutures intact without any evidence of dehiscence. Erythema appears to be almost completely resolved. There is no drainage or pus from the incision and there is no fluctuation or crepitation. There is minimal edema to the 4th toe without any significant erythema or ascending cellulitis.  No pain with calf compression, swelling, warmth, erythema  Assessment: Postop day #2 status post right partial 5th ray amputation with delayed primary closure and wound debridement  Plan: Overall seems be doing well. WBC improved, afebrile, CRP improved. Adaptic was applied followed by DSD. Awaiting wound culture results. Continue current antibiotics. Likely discharge Sunday after cultures finalize. Continue Darco wedge shoe he can be weightbearing as tolerated in this.  Waiting home health arrangements for 3 times a week dressing changes.  I will see him back in the office on Tuesday.  We will continue to follow while inpatient.  Celesta Gentile, DPM O: [phone] C: [phone]

## 2020-04-01 NOTE — Op Note (Signed)
PATIENT:  Brian [name]  59 y.o. male  PRE-OPERATIVE DIAGNOSIS:  wound  POST-OPERATIVE DIAGNOSIS:  Wound  PROCEDURE:  Procedure(s): DEBRIDEMENT WOUND RIGHT FOOT (Right)  SURGEON:  Surgeon(s) and Role:    * Trula Slade, DPM - Primary  PHYSICIAN ASSISTANT:   ASSISTANTS: none   ANESTHESIA:   MAC  EBL:  15cc   BLOOD ADMINISTERED:none  DRAINS: none   LOCAL MEDICATIONS USED:  OTHER 20 cc lidocaine and marcaine plain  SPECIMEN:  No Specimen  DISPOSITION OF SPECIMEN:  N/A  COUNTS:  YES  TOURNIQUET:  * No tourniquets in log *  DICTATION: .Dragon Dictation  PLAN OF CARE: Admit to inpatient   PATIENT DISPOSITION:  PACU - hemodynamically stable.  Indications for surgery: 59 year old male was admitted to the hospital for osteomyelitis, cellulitis of the right fifth toe.  He presented with partial fifth ray amputation and discussed plan return to the operating room for further debridement, delayed primary closure of the wound.  We discussed the surgery as well as the postoperative course.  All alternatives, risks, complications were discussed.  No promises or guarantees were given aspect of the procedure all questions were answered to the best my ability.  Clinically it appears that the infection has much improved.  Procedure in detail: The patient was both verbally and visually identified by myself, nursing staff, the anesthesia staff preoperatively.  He was then transferred the operative room via stretcher and the surgery took place on the stretcher.  After an adequate plane of anesthesia was obtained a timeout was performed and 20 cc of lidocaine, Marcaine plain was infiltrated in a regional block fashion around the surgical site.  The right lower extremities and scrubbed, prepped, draped in normal sterile fashion.  A secondary timeout was performed.  At this time I removed a single suture that was present.  I utilized a 15 blade scalpel to freshen the  wound edges and I used a #15 by scalpel as well as a rongeur to debride all nonviable as well as necrotic tissue from the wound.  Debrided down to the fifth metatarsal that was still present.  The remaining metatarsal appeared to be viable without any signs of infection.  There is no abscess noted during the procedure.  The fourth metatarsal and digit was not exposed.  At this time as the infection appears to be improved and well controlled proceeded with delayed primary closure.  The wound was closed with 3-0 Prolene except at the very distal aspect in which there was not enough skin to get the entire wound together but it was closed.  Wound was copiously irrigated saline hemostasis was achieved prior to wound closure.  I did not want to put too much tension on the wound.  The distal aspect had a saline wet-to-dry dressing applied followed by dry sterile dressing.  He was awoken from anesthesia and found to tolerate the procedure without any complications.  Transferred to PACU vital signs stable and vascular status intact.  Postoperative plan: To remain inpatient on IV advice await wound culture from previous.  Weight-bear as tolerated in Darco wedge shoe.  Hopeful discharge over the weekend

## 2020-04-01 NOTE — Progress Notes (Addendum)
PROGRESS NOTE                                                                                                                                                                                                             Patient Demographics:    Brian [name], is a 59 y.o. male, DOB - 1961/05/12, MEQ:[id number]  Admit date - 03/26/2020   Admitting Physician [name], MD  Outpatient Primary MD for the patient is [name], MD  LOS - 6  Chief Complaint  Patient presents with   Wound Infection       Brief Narrative  Brian [name] is a 59 y.o. male with medical history significant of CVA; HTN; DM; cryptogenic cirrhosis; and ESRD on HD presenting with a toe infection, work-up suggested right foot osteomyelitis, podiatry was consulted along with nephrology and he was admitted to the hospital.   Subjective:   Patient in bed, appears comfortable, denies any headache, no fever, no chest pain or pressure, no shortness of breath , no abdominal pain. No focal weakness.   Assessment  & Plan :     1.  Right foot infection with right fifth metatarsal head ulceration, abscess along with possible right fifth proximal phalanx and fifth toe osteomyelitis.  Currently on empiric antibiotics, inflammatory markers elevated, underwent ray amputation office right fifth toe on 03/27/2020, he had a second visit to the [facility] on 03/30/2020 with debridement of his wound, for now continue IV antibiotics , prelim wound cultures growing gram-negative coccobacilli - GEMELLA MORBILLORUM.  Follow inflammatory markers and culture results.  If all stable will discontinue vancomycin on 04/01/2020 and continue Rocephin and Flagyl.  Likely discharge on oral Keflex and Flagyl on Monday.  Per family they are cleaning his house and setting him up for safe discharge.  Might have mild PAD in the right area with ABI of 0.45.  Will have him follow with vascular outpatient.  Him on aspirin and statin.   ABI -    Right: Resting right ankle-brachial index is within normal range. No evidence of significant right lower extremity arterial disease. WNL at [facility]. Mild at PT.   Left: Resting left ankle-brachial index is within normal range. No evidence of significant left lower extremity arterial disease.   Recent Labs  Lab 03/28/20 0648 03/29/20 0317 03/30/20 0350 03/31/20 0705 04/01/20 0224  WBC 12.2* 13.5* 11.9* 14.1* 13.1*  PLT 364 382 348 370 327  CRP 14.8* 11.9* 8.5* 6.5* 4.2*    2.  ESRD.  On MWF schedule.  Renal on board.  3.  Ongoing tobacco abuse.  Counseled to quit.  4.  Essential hypertension - pressure high despite being on Norvasc, will add scheduled hydralazine along with as needed hydralazine.  5.  DM type II with Karlene Lineman.  Sliding scale for now.  Lab Results  Component Value Date   HGBA1C 6.5 (H) 03/26/2020   CBG (last 3)  Recent Labs    03/31/20 1754 03/31/20 2109 04/01/20 0742  GLUCAP 145* 229* 144*      Condition - Fair  Family Communication  :  Aris [place] [phone] 03/31/20  Code Status :  Full  Consults  :  Renal, Podiatry  Procedures  :    MRI R foot -  1. Area of ulceration on the dorsal surface of the fifth metatarsal head with a loculated fluid collection measuring 1.4 x 0.7 x 1.5 cm, consistent with subcutaneous abscess. 2. Findings suggestive of early osteomyelitis involving the fifth proximal phalanx. 3. Probable reactive marrow seen throughout the fifth metatarsal head, distal fifth phalanx and fourth phalanges.  Ray amputation of the right fifth toe on 03/27/2020 by podiatrist Dr. Mayo Ao.  DEBRIDEMENT WOUND RIGHT FOOT (Right) - 03/30/20   PUD Prophylaxis : PPI  Disposition Plan  :    Status is: Inpatient  Dispo: The patient is from: Home              Anticipated d/c is to: Home              Anticipated d/c date is: 1-2 days              Patient currently is not medically stable to d/c.   DVT Prophylaxis  :   Heparin    Lab  Results  Component Value Date   PLT 327 04/01/2020    Diet :  Diet Order            Diet renal/carb modified with fluid restriction Diet-HS Snack? Nothing; Fluid restriction: 1200 mL Fluid; Room service appropriate? Yes; Fluid consistency: Thin  Diet effective now                  Inpatient Medications Scheduled Meds:  amLODipine  10 mg Oral Daily   Chlorhexidine Gluconate Cloth  6 each Topical Q0600   Chlorhexidine Gluconate Cloth  6 each Topical Q0600   docusate sodium  100 mg Oral BID   doxercalciferol  5 mcg Oral Q M,W,F-HD   feeding supplement (NEPRO CARB STEADY)  237 mL Oral BID BM   feeding supplement (PRO-STAT SUGAR FREE 64)  30 mL Oral BID   ferric citrate  420 mg Oral TID WC   heparin  5,000 Units Subcutaneous Q8H   hydrALAZINE  100 mg Oral Q8H   insulin aspart  0-6 Units Subcutaneous TID WC   metroNIDAZOLE  500 mg Oral Q8H   multivitamin  1 tablet Oral QHS   nicotine  14 mg Transdermal Daily   pantoprazole  20 mg Oral Daily   Continuous Infusions:  sodium chloride Stopped (03/30/20 1900)   cefTRIAXone (ROCEPHIN)  IV 2 g (04/01/20 0956)   vancomycin Stopped (03/31/20 1758)   PRN Meds:.acetaminophen **OR** acetaminophen, loperamide, ondansetron **OR** ondansetron (ZOFRAN) IV  Antibiotics  :   Anti-infectives (From admission, onward)   Start     Dose/Rate Route Frequency Ordered Stop   03/31/20 1541  vancomycin (VANCOCIN) 1-5 GM/200ML-% IVPB       Note to Pharmacy: Anthonette Legato,  Melisa   : cabinet override      03/31/20 1541 04/01/20 0344   03/31/20 1400  metroNIDAZOLE (FLAGYL) tablet 500 mg     Discontinue     500 mg Oral Every 8 hours 03/31/20 0919     03/29/20 1438  vancomycin (VANCOCIN) 1-5 GM/200ML-% IVPB       Note to Pharmacy: Almira Bar   : cabinet override      03/29/20 1438 03/29/20 1445   03/27/20 1200  vancomycin (VANCOCIN) IVPB 1000 mg/200 mL premix     Discontinue     1,000 mg 200 mL/hr over 60 Minutes Intravenous Every  M-W-F (Hemodialysis) 03/26/20 1047     03/26/20 1045  cefTRIAXone (ROCEPHIN) 2 g in sodium chloride 0.9 % 100 mL IVPB     Discontinue     2 g 200 mL/hr over 30 Minutes Intravenous Every 24 hours 03/26/20 1036     03/26/20 1045  metroNIDAZOLE (FLAGYL) IVPB 500 mg  Status:  Discontinued        500 mg 100 mL/hr over 60 Minutes Intravenous Every 8 hours 03/26/20 1036 03/31/20 0919   03/26/20 0900  vancomycin (VANCOREADY) IVPB 1750 mg/350 mL        1,750 mg 175 mL/hr over 120 Minutes Intravenous  Once 03/26/20 0852 03/26/20 1450   03/26/20 0900  ceFEPIme (MAXIPIME) 2 g in sodium chloride 0.9 % 100 mL IVPB  Status:  Discontinued        2 g 200 mL/hr over 30 Minutes Intravenous Every 24 hours 03/26/20 0852 03/26/20 0854   03/26/20 0900  ceFEPIme (MAXIPIME) 1 g in sodium chloride 0.9 % 100 mL IVPB  Status:  Discontinued        1 g 200 mL/hr over 30 Minutes Intravenous Every 24 hours 03/26/20 0854 03/26/20 1036   03/26/20 0845  vancomycin (VANCOCIN) IVPB 1000 mg/200 mL premix  Status:  Discontinued        1,000 mg 200 mL/hr over 60 Minutes Intravenous  Once 03/26/20 0840 03/26/20 0852   03/26/20 0845  aztreonam (AZACTAM) 2 g in sodium chloride 0.9 % 100 mL IVPB  Status:  Discontinued        2 g 200 mL/hr over 30 Minutes Intravenous  Once 03/26/20 0840 03/26/20 0852          Objective:   Vitals:   03/31/20 1710 03/31/20 2106 04/01/20 0526 04/01/20 0753  BP: (!) 167/102 (!) 156/87 123/65 (!) 159/88  Pulse: 81 83 85   Resp:  18 18   Temp: 98.2 F (36.8 C) 98.7 F (37.1 C)    TempSrc: Oral     SpO2: 100% 100% 98%   Weight:      Height:        SpO2: 98 % O2 Flow Rate (L/min): 2 L/min  Wt Readings from Last 3 Encounters:  03/31/20 81.5 kg  03/07/20 84.8 kg  02/22/20 84.8 kg     Intake/Output Summary (Last 24 hours) at 04/01/2020 1035 Last data filed at 03/31/2020 1830 Gross per 24 hour  Intake 600 ml  Output 2200 ml  Net -1600 ml     Physical Exam  Awake Alert, No new  F.N deficits, Normal affect [place].AT,PERRAL Supple Neck,No JVD, No cervical lymphadenopathy appriciated.  Symmetrical Chest wall movement, Good air movement bilaterally, CTAB RRR,No Gallops, Rubs or new Murmurs, No Parasternal Heave +ve B.Sounds, Abd Soft, No tenderness, No organomegaly appriciated, No rebound - guarding or rigidity. Right foot remains under bandage  Data Review:    Recent Labs  Lab 03/28/20 0648 03/29/20 0317 03/30/20 0350 03/31/20 0705 04/01/20 0224  WBC 12.2* 13.5* 11.9* 14.1* 13.1*  HGB 12.2* 12.4* 12.3* 11.8* 12.1*  HCT 36.9* 37.5* 37.6* 36.1* 37.1*  PLT 364 382 348 370 327  MCV 102.2* 103.6* 103.3* 103.4* 103.6*  MCH 33.8 34.3* 33.8 33.8 33.8  MCHC 33.1 33.1 32.7 32.7 32.6  RDW 12.1 12.2 12.2 12.5 12.6  LYMPHSABS 2.9 4.7* 3.6 3.1 3.4  MONOABS 2.5* 2.3* 2.1* 2.0* 1.7*  EOSABS 0.2 0.6* 0.6* 0.0 0.3  BASOSABS 0.2* 0.2* 0.3* 0.1 0.1    Recent Labs  Lab  0000 03/26/20 0905 03/26/20 1138 03/27/20 0245 03/28/20 0648 03/29/20 0317 03/30/20 0350 03/31/20 0705 04/01/20 0224  NA  --   --   --    < > 135 135 134* 131* 135  K  --   --   --    < > 3.7 3.3* 3.7 3.9 3.8  CL  --   --   --    < > 96* 97* 97* 95* 98  CO2  --   --   --    < > 27 24 26 24 26   GLUCOSE  --   --   --    < > 170* 160* 212* 224* 228*  BUN  --   --   --    < > 34* 50* 24* 51* 24*  CREATININE  --   --   --    < > 6.36* 7.79* 4.72* 7.66* 5.18*  CALCIUM  --   --   --    < > 9.0 9.2 8.9 9.1 9.1  AST  --  37  --   --   --   --   --   --   --   ALT  --  22  --   --   --   --   --   --   --   ALKPHOS  --  129*  --   --   --   --   --   --   --   BILITOT  --  2.0*  --   --   --   --   --   --   --   ALBUMIN  --  3.0*  --   --   --   --   --   --   --   MG  --   --   --   --   --   --   --   --  1.9  CRP   < >  --  12.4*  --  14.8* 11.9* 8.5* 6.5* 4.2*  INR  --  1.0  --   --   --   --   --   --   --   HGBA1C  --   --  6.5*  --   --   --   --   --   --   BNP  --   --   --   --  1,261.8*  1,229.7* 1,343.9* 1,522.3* 1,394.5*   < > = values in this interval not displayed.    Recent Labs  Lab 03/26/20 1138 03/26/20 1138 03/28/20 0648 03/29/20 0317 03/30/20 0350 03/31/20 0705 04/01/20 0224  CRP 12.4*   < > 14.8* 11.9* 8.5* 6.5* 4.2*  BNP  --   --  1,261.8* 1,229.7* 1,343.9* 1,522.3* 1,394.5*  SARSCOV2NAA NEGATIVE  --   --   --   --   --   --    < > = values in this interval not displayed.    ------------------------------------------------------------------------------------------------------------------ No results for input(s): CHOL, HDL, LDLCALC, TRIG, CHOLHDL, LDLDIRECT in the last 72 hours.  Lab Results  Component Value Date   HGBA1C 6.5 (H) 03/26/2020   ------------------------------------------------------------------------------------------------------------------ No results for input(s): TSH, T4TOTAL, T3FREE, THYROIDAB in the last 72 hours.  Invalid input(s): FREET3 ------------------------------------------------------------------------------------------------------------------ No results for input(s): VITAMINB12, FOLATE, FERRITIN, TIBC, IRON, RETICCTPCT in the last 72 hours.  Coagulation profile Recent Labs  Lab 03/26/20 0905  INR 1.0    No results for input(s): DDIMER in the last 72 hours.  Cardiac Enzymes No results for input(s): CKMB, TROPONINI, MYOGLOBIN in the last 168 hours.  Invalid input(s): CK ------------------------------------------------------------------------------------------------------------------    Component Value Date/Time   BNP 1,394.5 (H) 04/01/2020 0224    Micro Results Recent Results (from the past 240 hour(s))  Blood Culture (routine x 2)     Status: None   Collection Time: 03/26/20  8:48 AM   Specimen: BLOOD RIGHT HAND  Result Value Ref Range Status   Specimen Description BLOOD RIGHT HAND  Final   Special Requests   Final    BOTTLES DRAWN AEROBIC AND ANAEROBIC Blood Culture adequate volume   Culture   Final      NO GROWTH 5 DAYS Performed at [facility], [address] [place], [place] [address]    Report Status 03/31/2020 FINAL  Final  SARS Coronavirus 2 by RT PCR (hospital order, performed in [facility] hospital lab) Nasopharyngeal Nasopharyngeal Swab     Status: None   Collection Time: 03/26/20 11:38 AM   Specimen: Nasopharyngeal Swab  Result Value Ref Range Status   SARS Coronavirus 2 NEGATIVE NEGATIVE Final    Comment: (NOTE) SARS-CoV-2 target nucleic acids are NOT DETECTED.  The SARS-CoV-2 RNA is generally detectable in upper and lower respiratory specimens during the acute phase of infection. The lowest concentration of SARS-CoV-2 viral copies this assay can detect is 250 copies / mL. A negative result does not preclude SARS-CoV-2 infection and should not be used as the sole basis for treatment or other patient management decisions.  A negative result may occur with improper specimen collection / handling, submission of specimen other than nasopharyngeal swab, presence of viral mutation(s) within the areas targeted by this assay, and inadequate number of viral copies (<250 copies / mL). A negative result must be combined with clinical observations, patient history, and epidemiological information.  Fact Sheet for Patients:   StrictlyIdeas.no  Fact Sheet for Healthcare Providers: BankingDealers.co.za  This test is not yet approved or  cleared by the [place] FDA and has been authorized for detection and/or diagnosis of SARS-CoV-2 by FDA under an Emergency Use Authorization (EUA).  This EUA will remain in effect (meaning this test can be used) for the duration of the COVID-19 declaration under Section 564(b)(1) of the Act, 21 U.S.C. section 360bbb-3(b)(1), unless the authorization is terminated or revoked sooner.  Performed at [facility], [place] [address] [place], [place] [address]   Surgical PCR screen      Status: None   Collection Time: 03/26/20  5:43 PM   Specimen: Nasal Mucosa; Nasal Swab  Result Value Ref Range Status   MRSA, PCR NEGATIVE NEGATIVE Final   Staphylococcus aureus NEGATIVE NEGATIVE Final    Comment: (NOTE) The Xpert SA Assay (FDA approved for  NASAL specimens in patients 51 years of age and older), is one component of a comprehensive surveillance program. It is not intended to diagnose infection nor to guide or monitor treatment. Performed at [facility], [place] [address] [place], [place] [address]   Blood Culture (routine x 2)     Status: None   Collection Time: 03/26/20  6:44 PM   Specimen: BLOOD RIGHT HAND  Result Value Ref Range Status   Specimen Description BLOOD RIGHT HAND  Final   Special Requests   Final    BOTTLES DRAWN AEROBIC ONLY Blood Culture adequate volume   Culture   Final    NO GROWTH 5 DAYS Performed at [facility], [place] [address], [place], [place] [address]    Report Status 03/31/2020 FINAL  Final  Aerobic/Anaerobic Culture (surgical/deep wound)     Status: None (Preliminary result)   Collection Time: 03/27/20 12:47 PM   Specimen: Toe, Right; Wound  Result Value Ref Range Status   Specimen Description WOUND  Final   Special Requests RIGHT FIFTH TOE SPEC A  Final   Gram Stain   Final    RARE WBC PRESENT,BOTH PMN AND MONONUCLEAR FEW GRAM NEGATIVE COCCOBACILLI    Culture   Final    FEW GEMELLA MORBILLORUM Standardized susceptibility testing for this organism is not available. Performed at [facility], [place] [address], [place],  [address]    Report Status PENDING  Incomplete    Radiology Reports DG Ankle Complete Right  Result Date: 03/26/2020 CLINICAL DATA:  Diabetes with neuropathy. Evaluate for osteomyelitis. EXAM: RIGHT ANKLE - COMPLETE 3+ VIEW COMPARISON:  None. FINDINGS: Ankle mortise is normal. Mild diffuse decreased bone mineralization. Moderate degenerative changes over the midfoot and hindfoot as well as  the tibiotalar joint. No evidence of bone destruction to suggest osteomyelitis. Spurring over the posterior and inferior calcaneus. IMPRESSION: No acute findings. Electronically Signed   By: Marin Olp M.D.   On: 03/26/2020 10:49   MR FOOT RIGHT WO CONTRAST  Result Date: 03/27/2020 CLINICAL DATA:  Open wound of the fifth toe, question of osteomyelitis EXAM: MRI OF THE RIGHT FOREFOOT WITHOUT CONTRAST TECHNIQUE: Multiplanar, multisequence MR imaging of the right was performed. No intravenous contrast was administered. COMPARISON:  None. FINDINGS: Bones/Joint/Cartilage There is diffusely increased T2 signal with subtle T1 hypointensity on the plantar surface of the fifth proximal phalanx within overlying a area of ulceration. There is also increased T2 hyperintense signal without T1 hypointensity within the fifth metatarsal head, distal fifth phalanx, and fourth phalanges. Mild midfoot osteoarthritis seen with joint space loss and subchondral cystic changes. First MTP joint osteoarthritis is seen. Ligaments The Lisfranc ligaments appear to be intact. Muscles and Tendons Diffuse fatty atrophy seen within the muscles surrounding the forefoot. The flexor and extensor tendons appear to be intact increased heterogeneous signal seen within the abductor digiti minimi tendon. Soft tissues There is a focal area of ulceration overlying the dorsal surface of the fifth metatarsal head with a loculated fluid collection measuring 1.4 x 0.7 x 1.5 cm. There is diffusely increased dorsal subcutaneous edema noted. IMPRESSION: 1. Area of ulceration on the dorsal surface of the fifth metatarsal head with a loculated fluid collection measuring 1.4 x 0.7 x 1.5 cm, consistent with subcutaneous abscess. 2. Findings suggestive of early osteomyelitis involving the fifth proximal phalanx. 3. Probable reactive marrow seen throughout the fifth metatarsal head, distal fifth phalanx and fourth phalanges. Electronically Signed   By: Kerby Moors  Avutu M.D.   On: 03/27/2020 00:58   DG Foot 2 Views Right  Result Date: 03/27/2020 CLINICAL DATA:  Right foot wound infection EXAM: RIGHT FOOT - 2 VIEW COMPARISON:  MRI 03/27/2020, radiograph 03/26/2020 FINDINGS: Interval amputation of the fifth digit at the level of the mid metatarsal. Cut edges are smooth. Degenerative changes at the first MTP joint. Small plantar calcaneal spur. IMPRESSION: Interval amputation of the fifth digit at the level of the mid metatarsal. No acute osseous abnormality Electronically Signed   By: Donavan Foil M.D.   On: 03/27/2020 20:49   DG Foot Complete Right  Result Date: 03/26/2020 CLINICAL DATA:  History of diabetes and neuropathy. Open wound between fourth and fifth toes 1 week. Evaluate for osteomyelitis. EXAM: RIGHT FOOT COMPLETE - 3+ VIEW COMPARISON:  None. FINDINGS: Soft tissue wound over the plantar surface of the forefoot adjacent the fifth MTP joint. No significant air in the soft tissues. Slight lucency over the head of the fifth metatarsal on the AP view without discrete bone destruction. No other areas of bone destruction to suggest osteomyelitis. There is moderate degenerative change over the first MTP joint and midfoot/hindfoot. Spurring over the posterior and inferior calcaneus. IMPRESSION: 1. Soft tissue wound over the plantar aspect of the forefoot adjacent the fifth MTP joint. Subtle lucency over the head of the fifth metatarsal mildly suspicious for osteomyelitis. No discrete bone destruction. 2.  Moderate degenerative changes as described Electronically Signed   By: Marin Olp M.D.   On: 03/26/2020 10:41   DG Toe 5th Right  Result Date: 03/26/2020 CLINICAL DATA:  Toe infection EXAM: RIGHT FIFTH TOE COMPARISON:  None. FINDINGS: There is question of periosteal reaction seen with cortical step-off at the base of the distal fifth phalanx. Diffuse overlying soft tissue swelling with subcutaneous emphysema is seen. IMPRESSION: Cortical irregularity and  periosteal reaction of the fifth distal phalanx which could be due to osteomyelitis. Overlying soft tissue swelling and subcutaneous emphysema. Electronically Signed   By: Prudencio Pair M.D.   On: 03/26/2020 01:38   DG MINI C-ARM IMAGE ONLY  Result Date: 03/30/2020 There is no interpretation for this exam.  This order is for images obtained during a surgical procedure.  Please See "Surgeries" Tab for more information regarding the procedure.   VAS [place] ABI WITH/WO TBI  Result Date: 04/01/2020 LOWER EXTREMITY DOPPLER STUDY Indications: Ulceration. High Risk Factors: Hypertension, hyperlipidemia, Diabetes, prior CVA.  Limitations: Today's exam was limited due to involuntary patient movement. Comparison Study: No prior studies. Performing Technologist: Darlin Coco  Examination Guidelines: A complete evaluation includes at minimum, Doppler waveform signals and systolic blood pressure reading at the level of bilateral brachial, anterior tibial, and posterior tibial arteries, when vessel segments are accessible. Bilateral testing is considered an integral part of a complete examination. Photoelectric Plethysmograph (PPG) waveforms and toe systolic pressure readings are included as required and additional duplex testing as needed. Limited examinations for reoccurring indications may be performed as noted.  ABI Findings: +---------+------------------+-----+---------+--------+  Right     Rt Pressure (mmHg) Index Waveform  Comment   +---------+------------------+-----+---------+--------+  Brachial  147                      triphasic           +---------+------------------+-----+---------+--------+  PTA       122                0.83  triphasic           +---------+------------------+-----+---------+--------+  DP        147                1.00  biphasic            +---------+------------------+-----+---------+--------+  Great Toe 66                 0.45  Abnormal             +---------+------------------+-----+---------+--------+ +---------+-----------------+-----+---------+---------------------------------+  Left      Lt Pressure       Index Waveform  Comment                                       (mmHg)                                                               +---------+-----------------+-----+---------+---------------------------------+  Brachial                          triphasic Unable to take pressure from LT                                                 arm                                +---------+-----------------+-----+---------+---------------------------------+  PTA       179               1.22  triphasic                                    +---------+-----------------+-----+---------+---------------------------------+  DP        182               1.24  triphasic                                    +---------+-----------------+-----+---------+---------------------------------+  Great Toe 122               0.83                                               +---------+-----------------+-----+---------+---------------------------------+ +-------+-----------+-----------+------------+------------+  ABI/TBI Today's ABI Today's TBI Previous ABI Previous TBI  +-------+-----------+-----------+------------+------------+  Right   1.0         .45                                    +-------+-----------+-----------+------------+------------+  Left    1.24        .83                                    +-------+-----------+-----------+------------+------------+  Summary: Right: Resting right ankle-brachial index is within normal range. No evidence of significant right lower extremity arterial disease. WNL at [facility]. Mild at PT. Left: Resting left ankle-brachial index is within normal range. No evidence of significant left lower extremity arterial disease.  *See table(s) above for measurements and observations.  Electronically signed by Deitra Mayo MD on 04/01/2020 at 6:26:05 AM.     Final     Time Spent in minutes  30   Lala Lund M.D on 04/01/2020 at 10:35 AM  To page go to [email or web address] - password [facility]

## 2020-04-01 NOTE — Progress Notes (Addendum)
[facility] Progress Note   Subjective:  Seen in room - eating breakfast. No CP/dyspnea. No foot pain at the moment. No issues with HD yesterday -  2.2L net UF.  Objective Vitals:   03/31/20 1710 03/31/20 2106 04/01/20 0526 04/01/20 0753  BP: (!) 167/102 (!) 156/87 123/65 (!) 159/88  Pulse: 81 83 85   Resp:  18 18   Temp: 98.2 F (36.8 C) 98.7 F (37.1 C)    TempSrc: Oral     SpO2: 100% 100% 98%   Weight:      Height:       Physical Exam General: Well appearing man, NAD Heart: RRR; no murmur Lungs: CTAB Abdomen: soft, non-tender Extremities: No LE edema; R foot bandaged Dialysis Access: L AVF + thrill  Additional Objective Labs: Basic Metabolic Panel: Recent Labs  Lab 03/30/20 0350 03/31/20 0705 04/01/20 0224  NA 134* 131* 135  K 3.7 3.9 3.8  CL 97* 95* 98  CO2 26 24 26   GLUCOSE 212* 224* 228*  BUN 24* 51* 24*  CREATININE 4.72* 7.66* 5.18*  CALCIUM 8.9 9.1 9.1  PHOS 2.6  --   --    Liver Function Tests: Recent Labs  Lab 03/26/20 0905  AST 37  ALT 22  ALKPHOS 129*  BILITOT 2.0*  PROT 6.8  ALBUMIN 3.0*   CBC: Recent Labs  Lab 03/28/20 0648 03/28/20 0648 03/29/20 0317 03/29/20 0317 03/30/20 0350 03/31/20 0705 04/01/20 0224  WBC 12.2*   < > 13.5*   < > 11.9* 14.1* 13.1*  NEUTROABS 6.3   < > 5.4   < > 5.2 8.8* 7.4  HGB 12.2*   < > 12.4*   < > 12.3* 11.8* 12.1*  HCT 36.9*   < > 37.5*   < > 37.6* 36.1* 37.1*  MCV 102.2*  --  103.6*  --  103.3* 103.4* 103.6*  PLT 364   < > 382   < > 348 370 327   < > = values in this interval not displayed.   Blood Culture    Component Value Date/Time   SDES WOUND 03/27/2020 1247   SPECREQUEST RIGHT FIFTH TOE SPEC A 03/27/2020 1247   CULT  03/27/2020 1247    FEW GEMELLA MORBILLORUM Standardized susceptibility testing for this organism is not available. Performed at [facility], [place] [address] [place], [place] [address]    REPTSTATUS PENDING 03/27/2020 1247   Studies/Results: DG  MINI C-ARM IMAGE ONLY  Result Date: 03/30/2020 There is no interpretation for this exam.  This order is for images obtained during a surgical procedure.  Please See "Surgeries" Tab for more information regarding the procedure.   VAS [place] ABI WITH/WO TBI  Result Date: 04/01/2020 LOWER EXTREMITY DOPPLER STUDY Indications: Ulceration. High Risk Factors: Hypertension, hyperlipidemia, Diabetes, prior CVA.  Limitations: Today's exam was limited due to involuntary patient movement. Comparison Study: No prior studies. Performing Technologist: Darlin Coco  Examination Guidelines: A complete evaluation includes at minimum, Doppler waveform signals and systolic blood pressure reading at the level of bilateral brachial, anterior tibial, and posterior tibial arteries, when vessel segments are accessible. Bilateral testing is considered an integral part of a complete examination. Photoelectric Plethysmograph (PPG) waveforms and toe systolic pressure readings are included as required and additional duplex testing as needed. Limited examinations for reoccurring indications may be performed as noted.  ABI Findings: +---------+------------------+-----+---------+--------+ Right    Rt Pressure (mmHg)IndexWaveform Comment  +---------+------------------+-----+---------+--------+ Brachial 147  triphasic         +---------+------------------+-----+---------+--------+ PTA      122               0.83 triphasic         +---------+------------------+-----+---------+--------+ DP       147               1.00 biphasic          +---------+------------------+-----+---------+--------+ Great Toe66                0.45 Abnormal          +---------+------------------+-----+---------+--------+ +---------+-----------------+-----+---------+---------------------------------+ Left     Lt Pressure      IndexWaveform Comment                                    (mmHg)                                                            +---------+-----------------+-----+---------+---------------------------------+ Brachial                       triphasicUnable to take pressure from LT                                           arm                               +---------+-----------------+-----+---------+---------------------------------+ PTA      179              1.22 triphasic                                  +---------+-----------------+-----+---------+---------------------------------+ DP       182              1.24 triphasic                                  +---------+-----------------+-----+---------+---------------------------------+ Great Toe122              0.83                                            +---------+-----------------+-----+---------+---------------------------------+ +-------+-----------+-----------+------------+------------+ ABI/TBIToday's ABIToday's TBIPrevious ABIPrevious TBI +-------+-----------+-----------+------------+------------+ Right  1.0        .45                                 +-------+-----------+-----------+------------+------------+ Left   1.24       .83                                 +-------+-----------+-----------+------------+------------+  Summary: Right: Resting right ankle-brachial index is within normal range. No evidence  of significant right lower extremity arterial disease. WNL at [facility]. Mild at PT. Left: Resting left ankle-brachial index is within normal range. No evidence of significant left lower extremity arterial disease.  *See table(s) above for measurements and observations.  Electronically signed by Deitra Mayo MD on 04/01/2020 at 6:26:05 AM.    Final    Medications: . sodium chloride Stopped (03/30/20 1900)  . cefTRIAXone (ROCEPHIN)  IV 2 g (03/31/20 1037)  . vancomycin Stopped (03/31/20 1758)   . amLODipine  10 mg Oral Daily  . Chlorhexidine Gluconate Cloth  6 each Topical Q0600  . Chlorhexidine  Gluconate Cloth  6 each Topical Q0600  . docusate sodium  100 mg Oral BID  . doxercalciferol  5 mcg Oral Q M,W,F-HD  . feeding supplement (NEPRO CARB STEADY)  237 mL Oral BID BM  . feeding supplement (PRO-STAT SUGAR FREE 64)  30 mL Oral BID  . ferric citrate  420 mg Oral TID WC  . heparin  5,000 Units Subcutaneous Q8H  . hydrALAZINE  50 mg Oral Q8H  . insulin aspart  0-6 Units Subcutaneous TID WC  . metroNIDAZOLE  500 mg Oral Q8H  . multivitamin  1 tablet Oral QHS  . nicotine  14 mg Transdermal Daily  . pantoprazole  20 mg Oral Daily    Dialysis Orders: [facility] MWF 4h 400/800 82kg 2/2 bath AVF L Hep 3000 - hect 5 - no esa  Assessment/Plan: 1.R diabetic foot infection/osteomyelitis:Seen by podiatry,s/p 5th ray amputation on 6/21 - followed by debridement + wound closure 6/24. Wound Cx growing GEMELLA MORBILLORUM - on Ceftriaxone. For discharge - we cannot give Ceftriaxone with HD - can give Cefazolin, Ceftazidime, Vanc -> consider change to one of these IV versus PO med on discharge. 2. ESRD:Continue HD per MWF schedule - next 6/28. 3.HTN/volume:BP variable. Continue amlodipine and hydralazine.He is slightly below his outpatient EDW - lower on discharge. 4. Anemia:Hgb12.1 - No ESA/Fe indicated. 5. Secondary hyperparathyroidism:Calcium and phos at goal. Continue hectorol and auryxia. 6. Nutrition:Alb low - continue Nepro + pro-stat supplements. 7.T2DM: Insulin per primary  Veneta Penton, PA-C 04/01/2020, 9:40 AM  [facility]  Patient seen and examined, agree with above note with above modifications. NO c/o's-  Disappointed is not able to go home yet-  I guess plan is to stay til Monday ?  No issues today- HD next on Monday-  Appropriate titration of dialysis related meds Corliss Parish, MD 04/01/2020

## 2020-04-02 LAB — BASIC METABOLIC PANEL
Anion gap: 11 (ref 5–15)
BUN: 41 mg/dL — ABNORMAL HIGH (ref 6–20)
CO2: 25 mmol/L (ref 22–32)
Calcium: 9.2 mg/dL (ref 8.9–10.3)
Chloride: 98 mmol/L (ref 98–111)
Creatinine, Ser: 7.08 mg/dL — ABNORMAL HIGH (ref 0.61–1.24)
GFR calc Af Amer: 9 mL/min — ABNORMAL LOW (ref >60)
GFR calc non Af Amer: 8 mL/min — ABNORMAL LOW (ref >60)
Glucose, Bld: 171 mg/dL — ABNORMAL HIGH (ref 70–99)
Potassium: 3.7 mmol/L (ref 3.5–5.1)
Sodium: 134 mmol/L — ABNORMAL LOW (ref 135–145)

## 2020-04-02 LAB — CBC WITH DIFFERENTIAL/PLATELET
Abs Immature Granulocytes: 0.27 10*3/uL — ABNORMAL HIGH (ref 0.00–0.07)
Basophils Absolute: 0.2 10*3/uL — ABNORMAL HIGH (ref 0.0–0.1)
Basophils Relative: 1 %
Eosinophils Absolute: 0.2 10*3/uL (ref 0.0–0.5)
Eosinophils Relative: 2 %
HCT: 35.1 % — ABNORMAL LOW (ref 39.0–52.0)
Hemoglobin: 11.3 g/dL — ABNORMAL LOW (ref 13.0–17.0)
Immature Granulocytes: 2 %
Lymphocytes Relative: 36 %
Lymphs Abs: 4.5 10*3/uL — ABNORMAL HIGH (ref 0.7–4.0)
MCH: 33.2 pg (ref 26.0–34.0)
MCHC: 32.2 g/dL (ref 30.0–36.0)
MCV: 103.2 fL — ABNORMAL HIGH (ref 80.0–100.0)
Monocytes Absolute: 2.2 10*3/uL — ABNORMAL HIGH (ref 0.1–1.0)
Monocytes Relative: 18 %
Neutro Abs: 5.3 10*3/uL (ref 1.7–7.7)
Neutrophils Relative %: 41 %
Platelets: 324 10*3/uL (ref 150–400)
RBC: 3.4 MIL/uL — ABNORMAL LOW (ref 4.22–5.81)
RDW: 12.8 % (ref 11.5–15.5)
WBC: 12.6 10*3/uL — ABNORMAL HIGH (ref 4.0–10.5)
nRBC: 0 % (ref 0.0–0.2)

## 2020-04-02 LAB — GLUCOSE, CAPILLARY
Glucose-Capillary: 178 mg/dL — ABNORMAL HIGH (ref 70–99)
Glucose-Capillary: 140 mg/dL — ABNORMAL HIGH (ref 70–99)

## 2020-04-02 LAB — BRAIN NATRIURETIC PEPTIDE: B Natriuretic Peptide: 1120.6 pg/mL — ABNORMAL HIGH (ref 0.0–100.0)

## 2020-04-02 LAB — C-REACTIVE PROTEIN: CRP: 3 mg/dL — ABNORMAL HIGH (ref <1.0)

## 2020-04-02 LAB — MAGNESIUM: Magnesium: 2 mg/dL (ref 1.7–2.4)

## 2020-04-02 MED ORDER — METOPROLOL TARTRATE 50 MG PO TABS: 50.0000 mg | ORAL_TABLET | Freq: Two times a day (BID) | ORAL | 0 refills | Status: DC

## 2020-04-02 MED ORDER — METRONIDAZOLE 500 MG PO TABS: 500.0000 mg | ORAL_TABLET | Freq: Three times a day (TID) | ORAL | 0 refills | Status: DC

## 2020-04-02 MED ORDER — ATORVASTATIN CALCIUM 40 MG PO TABS: 40.0000 mg | ORAL_TABLET | Freq: Every day | ORAL | 0 refills | Status: DC

## 2020-04-02 MED ORDER — CEPHALEXIN 500 MG PO CAPS: 500.0000 mg | ORAL_CAPSULE | Freq: Three times a day (TID) | ORAL | 0 refills | Status: AC

## 2020-04-02 MED ORDER — METOPROLOL TARTRATE 50 MG PO TABS
50.0000 mg | ORAL_TABLET | Freq: Two times a day (BID) | ORAL | Status: DC
  Administered 2020-04-02: 50 mg via ORAL
  Filled 2020-04-02: qty 1

## 2020-04-02 MED ORDER — HYDRALAZINE HCL 100 MG PO TABS: 100.0000 mg | ORAL_TABLET | Freq: Three times a day (TID) | ORAL | 0 refills | Status: DC

## 2020-04-02 MED ORDER — ONDANSETRON HCL 4 MG PO TABS: 4.0000 mg | ORAL_TABLET | Freq: Three times a day (TID) | ORAL | 0 refills | Status: DC | PRN

## 2020-04-02 MED ORDER — AMLODIPINE BESYLATE 10 MG PO TABS: 10.0000 mg | ORAL_TABLET | Freq: Every day | ORAL | 0 refills | Status: AC

## 2020-04-02 NOTE — Progress Notes (Addendum)
Patient was discharged home by MD order; discharged instructions  review and give to patient with care notes and prescriptions; IV DIC; skin intact; patient will be escorted to the car by nurse tech via wheelchair.  Patient has all belongings with him upon discharge.

## 2020-04-02 NOTE — Discharge Instructions (Signed)
Keep your wound clean and dry, wear the given boot and your right foot whenever you are out of bed and bearing weight on your right leg.    Follow with Primary MD [name], MD in 7 days   Get CBC, CMP, 2 view Chest X ray -  checked next visit within 1 week by Primary MD or SNF MD ( we routinely change or add medications that can affect your baseline labs and fluid status, therefore we recommend that you get the mentioned basic workup next visit with your PCP, your PCP may decide not to get them or add new tests based on their clinical decision)  Activity: As tolerated with Full fall precautions use walker/cane & assistance as needed  Disposition Home   Diet:  Renal diet with 1.2 L fluid restriction per day  Special Instructions: If you have smoked or chewed Tobacco  in the last 2 yrs please stop smoking, stop any regular Alcohol  and or any Recreational drug use.  On your next visit with your primary care physician please Get Medicines reviewed and adjusted.  Please request your Prim.MD to go over all Hospital Tests and Procedure/Radiological results at the follow up, please get all Hospital records sent to your Prim MD by signing hospital release before you go home.  If you experience worsening of your admission symptoms, develop shortness of breath, life threatening emergency, suicidal or homicidal thoughts you must seek medical attention immediately by calling 911 or calling your MD immediately  if symptoms less severe.  You Must read complete instructions/literature along with all the possible adverse reactions/side effects for all the Medicines you take and that have been prescribed to you. Take any new Medicines after you have completely understood and accpet all the possible adverse reactions/side effects.

## 2020-04-02 NOTE — Plan of Care (Signed)
  Problem: Education: Goal: Knowledge of General Education information will improve Description: Including pain rating scale, medication(s)/side effects and non-pharmacologic comfort measures Outcome: Adequate for Discharge   Problem: Health Behavior/Discharge Planning: Goal: Ability to manage health-related needs will improve Outcome: Adequate for Discharge   Problem: Clinical Measurements: Goal: Ability to maintain clinical measurements within normal limits will improve Outcome: Adequate for Discharge Goal: Will remain free from infection Outcome: Adequate for Discharge Goal: Diagnostic test results will improve Outcome: Adequate for Discharge Goal: Respiratory complications will improve Outcome: Adequate for Discharge Goal: Cardiovascular complication will be avoided Outcome: Adequate for Discharge   Problem: Activity: Goal: Risk for activity intolerance will decrease Outcome: Adequate for Discharge   Problem: Nutrition: Goal: Adequate nutrition will be maintained Outcome: Adequate for Discharge   Problem: Coping: Goal: Level of anxiety will decrease Outcome: Adequate for Discharge   Problem: Elimination: Goal: Will not experience complications related to bowel motility Outcome: Adequate for Discharge Goal: Will not experience complications related to urinary retention Outcome: Adequate for Discharge   Problem: Pain Managment: Goal: General experience of comfort will improve Outcome: Adequate for Discharge   Problem: Skin Integrity: Goal: Risk for impaired skin integrity will decrease Outcome: Adequate for Discharge   Problem: Safety: Goal: Ability to remain free from injury will improve Outcome: Adequate for Discharge   

## 2020-04-02 NOTE — Discharge Summary (Addendum)
Brian [name] ZJI:[id number] DOB: 1960/12/30 DOA: 03/26/2020  PCP: [name], MD  Admit date: 03/26/2020  Discharge date: 04/02/2020  Admitted From: Home  Disposition:  Home   Recommendations for Outpatient Follow-up:   Follow up with PCP in 1-2 weeks  PCP Please obtain BMP/CBC, 2 view CXR in 1week,  (see Discharge instructions)   PCP Please follow up on the following pending results:    Home Health: RN,PT,OT   Equipment/Devices: Cane  Consultations: Nephrology, podiatry Discharge Condition: Stable    CODE STATUS: Full     Diet Recommendation: Renal diet with 1.2 L fluid restriction    Chief Complaint  Patient presents with  . Wound Infection     Brief history of present illness from the day of admission and additional interim summary    Brian [name] a 59 y.o.malewith medical history significant ofCVA; HTN; DM; cryptogenic cirrhosis; and ESRD on HD presenting with a toe infection, work-up suggested right foot osteomyelitis, podiatry was consulted along with nephrology and he was admitted to the hospital.                                                                  Hospital Course   1.  Right foot infection with right fifth metatarsal head ulceration, abscess along with possible right fifth proximal phalanx and fifth toe osteomyelitis.  Currently on empiric antibiotics, inflammatory markers elevated, underwent ray amputation office right fifth toe on 03/27/2020, he had a second visit to the [facility] on 03/30/2020 with debridement of his wound, for now continue IV antibiotics , prelim wound cultures growing gram-negative coccobacilli - Brian [name].  Clinically he has responded very well to surgical debridement and empiric Rocephin Flagyl combination.  I am placing him on 1 more week of  oral Keflex and Flagyl thereafter per podiatrist.  Per family they are cleaning his house and setting him up for safe discharge.  Might have mild PAD in the right area with ABI of 0.45.  Will have him follow with vascular outpatient.  Wanted to place him on aspirin and statin but patient refuses to take aspirin due to allergy, continue statin and follow with vascular and podiatry outpatient.   ABI -   Right: Resting right ankle-brachial index is within normal range. No evidence of significant right lower extremity arterial disease. WNL at [facility]. Mild at PT.   Left: Resting left ankle-brachial index is within normal range. No evidence of significant left lower extremity arterial disease.   2.  ESRD.  On MWF schedule.  Renal on board was dialyzed per routine.  3.  Ongoing tobacco abuse.  Counseled to quit.  4.  Essential hypertension -blood pressure in poor control adjusted the dose of Norvasc, added hydralazine and Lopressor for better control.  5.  DM  type II with Karlene Lineman.  New home regimen and follow with PCP.  Lab Results  Component Value Date   HGBA1C 6.5 (H) 03/26/2020       Discharge diagnosis     Principal Problem:   Diabetic infection of right foot ([place]) Active Problems:   ESRD on dialysis ([place])   Hypertension   Tobacco abuse- >er than 55 pack yr hx; current smoker   Type 1 diabetes mellitus with kidney complication (HCC) - on insulin   H/O hyperlipidemia- sev yrs ago    Discharge instructions    Discharge Instructions    Diet - low sodium heart healthy   Complete by: As directed    Discharge instructions   Complete by: As directed    Keep your wound clean and dry, wear the given boot and your right foot whenever you are out of bed and bearing weight on your right leg.    Follow with Primary MD [name], MD in 7 days   Get CBC, CMP, 2 view Chest X ray -  checked next visit within 1 week by Primary MD or SNF MD ( we routinely change or add  medications that can affect your baseline labs and fluid status, therefore we recommend that you get the mentioned basic workup next visit with your PCP, your PCP may decide not to get them or add new tests based on their clinical decision)  Activity: As tolerated with Full fall precautions use walker/cane & assistance as needed  Disposition Home   Diet:  Renal diet with 1.2 L fluid restriction per day  Special Instructions: If you have smoked or chewed Tobacco  in the last 2 yrs please stop smoking, stop any regular Alcohol  and or any Recreational drug use.  On your next visit with your primary care physician please Get Medicines reviewed and adjusted.  Please request your Prim.MD to go over all Hospital Tests and Procedure/Radiological results at the follow up, please get all Hospital records sent to your Prim MD by signing hospital release before you go home.  If you experience worsening of your admission symptoms, develop shortness of breath, life threatening emergency, suicidal or homicidal thoughts you must seek medical attention immediately by calling 911 or calling your MD immediately  if symptoms less severe.  You Must read complete instructions/literature along with all the possible adverse reactions/side effects for all the Medicines you take and that have been prescribed to you. Take any new Medicines after you have completely understood and accpet all the possible adverse reactions/side effects.   Discharge wound care:   Complete by: As directed    Wet-to-dry dressing to the right foot postop wound 3 times a week.   Increase activity slowly   Complete by: As directed       Discharge Medications   Allergies as of 04/02/2020      Reactions   Penicillins Hives, Rash   Has patient had a PCN reaction causing immediate rash, facial/tongue/throat swelling, SOB or lightheadedness with hypotension: Yes Has patient had a PCN reaction causing severe rash involving mucus membranes or  skin necrosis: No Has patient had a PCN reaction that required hospitalization: No Has patient had a PCN reaction occurring within the last 10 years: No If all of the above answers are "NO", then may proceed with Cephalosporin use. Ceftriaxone = no issues   Nsaids    Due to kidneys   Aspirin Nausea And Vomiting   Other Reaction: GI Upset  Medication List    TAKE these medications   acetaminophen 325 MG tablet Commonly known as: TYLENOL Take 650 mg by mouth every 6 (six) hours as needed.   amLODipine 10 MG tablet Commonly known as: NORVASC Take 1 tablet (10 mg total) by mouth daily. What changed:   medication strength  how much to take  how to take this  when to take this   atorvastatin 40 MG tablet Commonly known as: LIPITOR Take 1 tablet (40 mg total) by mouth daily.   Auryxia 1 GM 210 MG(Fe) tablet Generic drug: ferric citrate Take 2 tablets by mouth 3 (three) times daily.   cephALEXin 500 MG capsule Commonly known as: KEFLEX Take 1 capsule (500 mg total) by mouth 3 (three) times daily for 7 days.   hydrALAZINE 100 MG tablet Commonly known as: APRESOLINE Take 1 tablet (100 mg total) by mouth every 8 (eight) hours.   insulin aspart protamine- aspart (70-30) 100 UNIT/ML injection Commonly known as: NOVOLOG MIX 70/30 Inject 0.05 mLs (5 Units total) into the skin at bedtime. What changed:   how much to take  additional instructions   metoprolol tartrate 50 MG tablet Commonly known as: LOPRESSOR Take 1 tablet (50 mg total) by mouth 2 (two) times daily.   metroNIDAZOLE 500 MG tablet Commonly known as: FLAGYL Take 1 tablet (500 mg total) by mouth 3 (three) times daily.   ondansetron 4 MG tablet Commonly known as: Zofran Take 1 tablet (4 mg total) by mouth every 8 (eight) hours as needed for nausea or vomiting.   pantoprazole 20 MG tablet Commonly known as: Protonix Take 1 tablet (20 mg total) by mouth daily.            Durable Medical  Equipment  (From admission, onward)         Start     Ordered   04/02/20 0722  For home use only DME Cane  Once        04/02/20 5643           Discharge Care Instructions  (From admission, onward)         Start     Ordered   04/02/20 0000  Discharge wound care:       Comments: Wet-to-dry dressing to the right foot postop wound 3 times a week.   04/02/20 3295           Follow-up Information    [name], MD. Schedule an appointment as soon as possible for a visit in 1 week(s).   Specialty: Family Medicine Contact information: [address] [address] [phone]        Trula Slade, DPM. Schedule an appointment as soon as possible for a visit in 3 day(s).   Specialty: Podiatry Contact information: [address] [place] [address] [phone]        Waynetta Sandy, MD. Schedule an appointment as soon as possible for a visit in 1 week(s).   Specialties: Vascular Surgery, Cardiology Why: PAD Contact information: [place] [address] [place], [place] Follow up.   Why: For home health services. Start of care will be Tuesday, they will call you Monday to schedule a time to come out Contact information: [place] [address] [phone]               Major procedures and Radiology Reports -  PLEASE review detailed and final reports thoroughly  -       Ray amputation of the right fifth toe on 03/27/2020 by podiatrist Dr. Mayo Ao.  DEBRIDEMENT WOUND RIGHT FOOT (Right) - 03/30/20     DG Ankle Complete Right  Result Date: 03/26/2020 CLINICAL DATA:  Diabetes with neuropathy. Evaluate for osteomyelitis. EXAM: RIGHT ANKLE - COMPLETE 3+ VIEW COMPARISON:  None. FINDINGS: Ankle mortise is normal. Mild diffuse decreased bone mineralization. Moderate degenerative changes over the midfoot and hindfoot as well as  the tibiotalar joint. No evidence of bone destruction to suggest osteomyelitis. Spurring over the posterior and inferior calcaneus. IMPRESSION: No acute findings. Electronically Signed   By: Marin Olp M.D.   On: 03/26/2020 10:49   MR FOOT RIGHT WO CONTRAST  Result Date: 03/27/2020 CLINICAL DATA:  Open wound of the fifth toe, question of osteomyelitis EXAM: MRI OF THE RIGHT FOREFOOT WITHOUT CONTRAST TECHNIQUE: Multiplanar, multisequence MR imaging of the right was performed. No intravenous contrast was administered. COMPARISON:  None. FINDINGS: Bones/Joint/Cartilage There is diffusely increased T2 signal with subtle T1 hypointensity on the plantar surface of the fifth proximal phalanx within overlying a area of ulceration. There is also increased T2 hyperintense signal without T1 hypointensity within the fifth metatarsal head, distal fifth phalanx, and fourth phalanges. Mild midfoot osteoarthritis seen with joint space loss and subchondral cystic changes. First MTP joint osteoarthritis is seen. Ligaments The Lisfranc ligaments appear to be intact. Muscles and Tendons Diffuse fatty atrophy seen within the muscles surrounding the forefoot. The flexor and extensor tendons appear to be intact increased heterogeneous signal seen within the abductor digiti minimi tendon. Soft tissues There is a focal area of ulceration overlying the dorsal surface of the fifth metatarsal head with a loculated fluid collection measuring 1.4 x 0.7 x 1.5 cm. There is diffusely increased dorsal subcutaneous edema noted. IMPRESSION: 1. Area of ulceration on the dorsal surface of the fifth metatarsal head with a loculated fluid collection measuring 1.4 x 0.7 x 1.5 cm, consistent with subcutaneous abscess. 2. Findings suggestive of early osteomyelitis involving the fifth proximal phalanx. 3. Probable reactive marrow seen throughout the fifth metatarsal head, distal fifth phalanx and fourth phalanges. Electronically Signed   By: Prudencio Pair M.D.   On: 03/27/2020 00:58   DG Foot 2 Views Right  Result Date: 03/27/2020 CLINICAL DATA:  Right foot wound infection EXAM: RIGHT FOOT - 2 VIEW COMPARISON:  MRI 03/27/2020, radiograph 03/26/2020 FINDINGS: Interval amputation of the fifth digit at the level of the mid metatarsal. Cut edges are smooth. Degenerative changes at the first MTP joint. Small plantar calcaneal spur. IMPRESSION: Interval amputation of the fifth digit at the level of the mid metatarsal. No acute osseous abnormality Electronically Signed   By: Donavan Foil M.D.   On: 03/27/2020 20:49   DG Foot Complete Right  Result Date: 03/26/2020 CLINICAL DATA:  History of diabetes and neuropathy. Open wound between fourth and fifth toes 1 week. Evaluate for osteomyelitis. EXAM: RIGHT FOOT COMPLETE - 3+ VIEW COMPARISON:  None. FINDINGS: Soft tissue wound over the plantar surface of the forefoot adjacent the fifth MTP joint. No significant air in the soft tissues. Slight lucency over the head of the fifth metatarsal on the AP view without discrete bone destruction. No other areas of bone destruction to suggest osteomyelitis. There is moderate degenerative change over the first MTP joint and midfoot/hindfoot. Spurring over the posterior and inferior calcaneus. IMPRESSION: 1. Soft tissue wound over the plantar aspect of  the forefoot adjacent the fifth MTP joint. Subtle lucency over the head of the fifth metatarsal mildly suspicious for osteomyelitis. No discrete bone destruction. 2.  Moderate degenerative changes as described Electronically Signed   By: Marin Olp M.D.   On: 03/26/2020 10:41   DG Toe 5th Right  Result Date: 03/26/2020 CLINICAL DATA:  Toe infection EXAM: RIGHT FIFTH TOE COMPARISON:  None. FINDINGS: There is question of periosteal reaction seen with cortical step-off at the base of the distal fifth phalanx. Diffuse overlying soft tissue swelling with subcutaneous emphysema is seen. IMPRESSION: Cortical irregularity and  periosteal reaction of the fifth distal phalanx which could be due to osteomyelitis. Overlying soft tissue swelling and subcutaneous emphysema. Electronically Signed   By: Prudencio Pair M.D.   On: 03/26/2020 01:38   DG MINI C-ARM IMAGE ONLY  Result Date: 03/30/2020 There is no interpretation for this exam.  This order is for images obtained during a surgical procedure.  Please See "Surgeries" Tab for more information regarding the procedure.   VAS [place] ABI WITH/WO TBI  Result Date: 04/01/2020 LOWER EXTREMITY DOPPLER STUDY Indications: Ulceration. High Risk Factors: Hypertension, hyperlipidemia, Diabetes, prior CVA.  Limitations: Today's exam was limited due to involuntary patient movement. Comparison Study: No prior studies. Performing Technologist: Darlin Coco  Examination Guidelines: A complete evaluation includes at minimum, Doppler waveform signals and systolic blood pressure reading at the level of bilateral brachial, anterior tibial, and posterior tibial arteries, when vessel segments are accessible. Bilateral testing is considered an integral part of a complete examination. Photoelectric Plethysmograph (PPG) waveforms and toe systolic pressure readings are included as required and additional duplex testing as needed. Limited examinations for reoccurring indications may be performed as noted.  ABI Findings: +---------+------------------+-----+---------+--------+ Right    Rt Pressure (mmHg)IndexWaveform Comment  +---------+------------------+-----+---------+--------+ Brachial 147                    triphasic         +---------+------------------+-----+---------+--------+ PTA      122               0.83 triphasic         +---------+------------------+-----+---------+--------+ DP       147               1.00 biphasic          +---------+------------------+-----+---------+--------+ Great Toe66                0.45 Abnormal           +---------+------------------+-----+---------+--------+ +---------+-----------------+-----+---------+---------------------------------+ Left     Lt Pressure      IndexWaveform Comment                                    (mmHg)                                                           +---------+-----------------+-----+---------+---------------------------------+ Brachial                       triphasicUnable to take pressure from LT  arm                               +---------+-----------------+-----+---------+---------------------------------+ PTA      179              1.22 triphasic                                  +---------+-----------------+-----+---------+---------------------------------+ DP       182              1.24 triphasic                                  +---------+-----------------+-----+---------+---------------------------------+ Great Toe122              0.83                                            +---------+-----------------+-----+---------+---------------------------------+ +-------+-----------+-----------+------------+------------+ ABI/TBIToday's ABIToday's TBIPrevious ABIPrevious TBI +-------+-----------+-----------+------------+------------+ Right  1.0        .45                                 +-------+-----------+-----------+------------+------------+ Left   1.24       .83                                 +-------+-----------+-----------+------------+------------+  Summary: Right: Resting right ankle-brachial index is within normal range. No evidence of significant right lower extremity arterial disease. WNL at [facility]. Mild at PT. Left: Resting left ankle-brachial index is within normal range. No evidence of significant left lower extremity arterial disease.  *See table(s) above for measurements and observations.  Electronically signed by Deitra Mayo MD on 04/01/2020 at 6:26:05 AM.     Final     Micro Results     Recent Results (from the past 240 hour(s))  Blood Culture (routine x 2)     Status: None   Collection Time: 03/26/20  8:48 AM   Specimen: BLOOD RIGHT HAND  Result Value Ref Range Status   Specimen Description BLOOD RIGHT HAND  Final   Special Requests   Final    BOTTLES DRAWN AEROBIC AND ANAEROBIC Blood Culture adequate volume   Culture   Final    NO GROWTH 5 DAYS Performed at [facility], [address] [place], [place] [address]    Report Status 03/31/2020 FINAL  Final  SARS Coronavirus 2 by RT PCR (hospital order, performed in [facility] hospital lab) Nasopharyngeal Nasopharyngeal Swab     Status: None   Collection Time: 03/26/20 11:38 AM   Specimen: Nasopharyngeal Swab  Result Value Ref Range Status   SARS Coronavirus 2 NEGATIVE NEGATIVE Final    Comment: (NOTE) SARS-CoV-2 target nucleic acids are NOT DETECTED.  The SARS-CoV-2 RNA is generally detectable in upper and lower respiratory specimens during the acute phase of infection. The lowest concentration of SARS-CoV-2 viral copies this assay can detect is 250 copies / mL. A negative result does not preclude SARS-CoV-2 infection and should not be used as the sole basis for treatment or other patient  management decisions.  A negative result may occur with improper specimen collection / handling, submission of specimen other than nasopharyngeal swab, presence of viral mutation(s) within the areas targeted by this assay, and inadequate number of viral copies (<250 copies / mL). A negative result must be combined with clinical observations, patient history, and epidemiological information.  Fact Sheet for Patients:   StrictlyIdeas.no  Fact Sheet for Healthcare Providers: BankingDealers.co.za  This test is not yet approved or  cleared by the [place] FDA and has been authorized for detection and/or diagnosis of SARS-CoV-2 by FDA under  an Emergency Use Authorization (EUA).  This EUA will remain in effect (meaning this test can be used) for the duration of the COVID-19 declaration under Section 564(b)(1) of the Act, 21 U.S.C. section 360bbb-3(b)(1), unless the authorization is terminated or revoked sooner.  Performed at [facility], [place] [address] [place], [place] [address]   Surgical PCR screen     Status: None   Collection Time: 03/26/20  5:43 PM   Specimen: Nasal Mucosa; Nasal Swab  Result Value Ref Range Status   MRSA, PCR NEGATIVE NEGATIVE Final   Staphylococcus aureus NEGATIVE NEGATIVE Final    Comment: (NOTE) The Xpert SA Assay (FDA approved for NASAL specimens in patients 57 years of age and older), is one component of a comprehensive surveillance program. It is not intended to diagnose infection nor to guide or monitor treatment. Performed at [facility], [place] [address] [place], [place] [address]   Blood Culture (routine x 2)     Status: None   Collection Time: 03/26/20  6:44 PM   Specimen: BLOOD RIGHT HAND  Result Value Ref Range Status   Specimen Description BLOOD RIGHT HAND  Final   Special Requests   Final    BOTTLES DRAWN AEROBIC ONLY Blood Culture adequate volume   Culture   Final    NO GROWTH 5 DAYS Performed at [facility], [place] [address], [place], [place] [address]    Report Status 03/31/2020 FINAL  Final  Aerobic/Anaerobic Culture (surgical/deep wound)     Status: None   Collection Time: 03/27/20 12:47 PM   Specimen: Toe, Right; Wound  Result Value Ref Range Status   Specimen Description WOUND  Final   Special Requests RIGHT FIFTH TOE SPEC A  Final   Gram Stain   Final    RARE WBC PRESENT,BOTH PMN AND MONONUCLEAR FEW GRAM NEGATIVE COCCOBACILLI    Culture   Final    FEW Brian [name] Standardized susceptibility testing for this organism is not available. NO ANAEROBES ISOLATED Performed at [facility], [place] [address], [place], [place] [address]      Report Status 04/01/2020 FINAL  Final    Today   Subjective    Brian [name] today has no headache,no chest abdominal pain,no new weakness tingling or numbness, feels much better wants to go home today.     Objective   Blood pressure (!) 166/94, pulse 98, temperature 97.8 F (36.6 C), temperature source Oral, resp. rate 18, height 5\' 9"  (1.753 m), weight 81.5 kg, SpO2 98 %.  No intake or output data in the 24 hours ending 04/02/20 0952  Exam  Awake Alert, No new F.N deficits, Normal affect [place].AT,PERRAL Supple Neck,No JVD, No cervical lymphadenopathy appriciated.  Symmetrical Chest wall movement, Good air movement bilaterally, CTAB RRR,No Gallops,Rubs or new Murmurs, No Parasternal Heave +ve B.Sounds, Abd Soft, Non tender, No organomegaly appriciated, No rebound -guarding  or rigidity. No Cyanosis, Clubbing or edema, right foot remains under bandage   Data Review   CBC w Diff:  Lab Results  Component Value Date   WBC 12.6 (H) 04/02/2020   HGB 11.3 (L) 04/02/2020   HCT 35.1 (L) 04/02/2020   PLT 324 04/02/2020   LYMPHOPCT 36 04/02/2020   BANDSPCT 0 09/11/2010   MONOPCT 18 04/02/2020   EOSPCT 2 04/02/2020   BASOPCT 1 04/02/2020    CMP:  Lab Results  Component Value Date   NA 134 (L) 04/02/2020   K 3.7 04/02/2020   CL 98 04/02/2020   CO2 25 04/02/2020   BUN 41 (H) 04/02/2020   CREATININE 7.08 (H) 04/02/2020   PROT 6.8 03/26/2020   ALBUMIN 3.0 (L) 03/26/2020   BILITOT 2.0 (H) 03/26/2020   ALKPHOS 129 (H) 03/26/2020   AST 37 03/26/2020   ALT 22 03/26/2020  .   Total Time in preparing paper work, data evaluation and todays exam - 42 minutes  Lala Lund M.D on 04/02/2020 at 9:52 AM  [facility]   Office  [phone]

## 2020-04-02 NOTE — TOC Transition Note (Signed)
Transition of Care [facility]) - CM/SW Discharge Note   Patient Details  Name: Brian [name] MRN: [id number] Date of Birth: 12/04/1960  Transition of Care [facility] CM/SW Contact:  Carles Collet, RN Phone Number: 04/02/2020, 9:05 AM   Clinical Narrative:   Spoke w patent. He is agreeable to [facility] services, does not have preference for provider. He declines need for cane. He has transportation home.  Referral accepted by [facility]. [place] for services Tuesday.      Final next level of care: [place] Barriers to Discharge: No Barriers Identified   Patient Goals and CMS Choice Patient states their goals for this hospitalization and ongoing recovery are:: to go home CMS Medicare.gov Compare Post Acute Care list provided to:: Patient Choice offered to / list presented to : Patient  Discharge Placement                       Discharge Plan and Services                          HH Arranged: RN, PT, OT [facility] Agency: [place] ([facility]) Date [place]: 04/02/20 Time [place]: [phone] Representative spoke with at [place]: [place] ([place]) Interventions     Readmission Risk Interventions No flowsheet data found.

## 2020-04-02 NOTE — Progress Notes (Signed)
Patient dressing changed on right foot. Pain has no pain while changing the dressing and when assessed.  Patient in no distress.

## 2020-04-02 NOTE — Progress Notes (Addendum)
[facility] Progress Note   Subjective:   Patient seen and examined at bedside.  Feeling well.  To be d/c home today per patient.  Some nausea this AM with his antibiotic.  Denies CP, SOB, vomiting, diarrhea and edema.    Objective Vitals:   04/01/20 1350 04/01/20 2056 04/02/20 0551 04/02/20 0700  BP: (!) 155/92 (!) 144/89 (!) 148/81 (!) 166/94  Pulse: 87 95 (!) 105 98  Resp: 17 18 18    Temp: 98.6 F (37 C) 98.4 F (36.9 C) 98.4 F (36.9 C) 97.8 F (36.6 C)  TempSrc: Oral  Oral Oral  SpO2: 98% 100% 98%   Weight:      Height:       Physical Exam General:well appearing male in NAD Heart:RRR Lungs:CTAB, nml WOB Abdomen:soft, NTND Extremities:R foot dressed. No edema b/l Dialysis Access: LU AVF +b/t   Filed Weights   03/31/20 0534 03/31/20 1235 03/31/20 1640  Weight: 86.4 kg 84.2 kg 81.5 kg   No intake or output data in the 24 hours ending 04/02/20 1008  Additional Objective Labs: Basic Metabolic Panel: Recent Labs  Lab 03/30/20 0350 03/30/20 0350 03/31/20 0705 04/01/20 0224 04/02/20 0109  NA 134*   < > 131* 135 134*  K 3.7   < > 3.9 3.8 3.7  CL 97*   < > 95* 98 98  CO2 26   < > 24 26 25   GLUCOSE 212*   < > 224* 228* 171*  BUN 24*   < > 51* 24* 41*  CREATININE 4.72*   < > 7.66* 5.18* 7.08*  CALCIUM 8.9   < > 9.1 9.1 9.2  PHOS 2.6  --   --   --   --    < > = values in this interval not displayed.   CBC: Recent Labs  Lab 03/29/20 0317 03/29/20 0317 03/30/20 0350 03/30/20 0350 03/31/20 0705 04/01/20 0224 04/02/20 0109  WBC 13.5*   < > 11.9*   < > 14.1* 13.1* 12.6*  NEUTROABS 5.4   < > 5.2   < > 8.8* 7.4 5.3  HGB 12.4*   < > 12.3*   < > 11.8* 12.1* 11.3*  HCT 37.5*   < > 37.6*   < > 36.1* 37.1* 35.1*  MCV 103.6*  --  103.3*  --  103.4* 103.6* 103.2*  PLT 382   < > 348   < > 370 327 324   < > = values in this interval not displayed.    CBG: Recent Labs  Lab 04/01/20 0742 04/01/20 1207 04/01/20 1634 04/01/20 2054 04/02/20 0741   GLUCAP 144* 171* 184* 233* 178*   Studies/Results: VAS [place] ABI WITH/WO TBI  Result Date: 04/01/2020 LOWER EXTREMITY DOPPLER STUDY Indications: Ulceration. High Risk Factors: Hypertension, hyperlipidemia, Diabetes, prior CVA.  Limitations: Today's exam was limited due to involuntary patient movement. Comparison Study: No prior studies. Performing Technologist: Darlin Coco  Examination Guidelines: A complete evaluation includes at minimum, Doppler waveform signals and systolic blood pressure reading at the level of bilateral brachial, anterior tibial, and posterior tibial arteries, when vessel segments are accessible. Bilateral testing is considered an integral part of a complete examination. Photoelectric Plethysmograph (PPG) waveforms and toe systolic pressure readings are included as required and additional duplex testing as needed. Limited examinations for reoccurring indications may be performed as noted.  ABI Findings: +---------+------------------+-----+---------+--------+ Right    Rt Pressure (mmHg)IndexWaveform Comment  +---------+------------------+-----+---------+--------+ Brachial 147  triphasic         +---------+------------------+-----+---------+--------+ PTA      122               0.83 triphasic         +---------+------------------+-----+---------+--------+ DP       147               1.00 biphasic          +---------+------------------+-----+---------+--------+ Great Toe66                0.45 Abnormal          +---------+------------------+-----+---------+--------+ +---------+-----------------+-----+---------+---------------------------------+ Left     Lt Pressure      IndexWaveform Comment                                    (mmHg)                                                           +---------+-----------------+-----+---------+---------------------------------+ Brachial                       triphasicUnable to take pressure  from LT                                           arm                               +---------+-----------------+-----+---------+---------------------------------+ PTA      179              1.22 triphasic                                  +---------+-----------------+-----+---------+---------------------------------+ DP       182              1.24 triphasic                                  +---------+-----------------+-----+---------+---------------------------------+ Great Toe122              0.83                                            +---------+-----------------+-----+---------+---------------------------------+ +-------+-----------+-----------+------------+------------+ ABI/TBIToday's ABIToday's TBIPrevious ABIPrevious TBI +-------+-----------+-----------+------------+------------+ Right  1.0        .45                                 +-------+-----------+-----------+------------+------------+ Left   1.24       .83                                 +-------+-----------+-----------+------------+------------+  Summary: Right: Resting right ankle-brachial index is within normal range. No evidence  of significant right lower extremity arterial disease. WNL at [facility]. Mild at PT. Left: Resting left ankle-brachial index is within normal range. No evidence of significant left lower extremity arterial disease.  *See table(s) above for measurements and observations.  Electronically signed by Deitra Mayo MD on 04/01/2020 at 6:26:05 AM.    Final     Medications: . sodium chloride Stopped (03/30/20 1900)  . cefTRIAXone (ROCEPHIN)  IV 2 g (04/01/20 0956)   . amLODipine  10 mg Oral Daily  . aspirin  81 mg Oral Daily  . atorvastatin  40 mg Oral Daily  . Chlorhexidine Gluconate Cloth  6 each Topical Q0600  . Chlorhexidine Gluconate Cloth  6 each Topical Q0600  . docusate sodium  100 mg Oral BID  . doxercalciferol  5 mcg Oral Q M,W,F-HD  . feeding supplement (NEPRO CARB  STEADY)  237 mL Oral BID BM  . feeding supplement (PRO-STAT SUGAR FREE 64)  30 mL Oral BID  . ferric citrate  420 mg Oral TID WC  . heparin  5,000 Units Subcutaneous Q8H  . hydrALAZINE  100 mg Oral Q8H  . insulin aspart  0-6 Units Subcutaneous TID WC  . metoprolol tartrate  50 mg Oral BID  . metroNIDAZOLE  500 mg Oral Q8H  . multivitamin  1 tablet Oral QHS  . nicotine  14 mg Transdermal Daily  . pantoprazole  20 mg Oral Daily    Dialysis Orders: [facility] MWF 4h 400/800 82kg 2/2 bath AVF L Hep 3000 - hect 5 - no esa  Assessment/Plan: 1.R diabetic foot infection/osteomyelitis:Seen by podiatry,s/p 5th ray amputation on 6/21 - followed by debridement + wound closure 6/24. Wound Cx growing GEMELLA MORBILLORUM - on Ceftriaxone. D/c on PO Keflex and Flagyl x 1wk, then to have antibiotics managed by podiatry.  2. ESRD:Continue HD per MWF schedule - next 6/28 at OP center. 3.HTN/volume:BP variable. Continue amlodipineand hydralazine.He is slightly below his outpatient EDW - lower on discharge. Last post wt 81.5kg. 4. Anemia:Hgb11.3 - No ESA/Fe indicated. 5. Secondary hyperparathyroidism:Calcium and phos at goal. Continue hectorol and auryxia. 6. Nutrition:Alb low - continue Nepro + pro-stat supplements. 7.T2DM: Insulin per primary 8. Dispo - ok for d/c from renal standpoint.  Jen Mow, PA-C [facility] Pager: [phone] 04/02/2020,10:08 AM  LOS: 7 days    Patient seen and examined, agree with above note with above modifications. Going home today-  Will be due for HD tomorrow - will go to OP center  Corliss Parish, MD 04/02/2020

## 2020-04-03 ENCOUNTER — Encounter (HOSPITAL_COMMUNITY): Payer: Self-pay | Admitting: Podiatry

## 2020-04-04 ENCOUNTER — Ambulatory Visit (INDEPENDENT_AMBULATORY_CARE_PROVIDER_SITE_OTHER): Payer: Medicare Other | Admitting: Podiatry

## 2020-04-04 ENCOUNTER — Other Ambulatory Visit: Payer: Self-pay

## 2020-04-04 DIAGNOSIS — E1149 Type 2 diabetes mellitus with other diabetic neurological complication: Secondary | ICD-10-CM

## 2020-04-04 DIAGNOSIS — E08621 Diabetes mellitus due to underlying condition with foot ulcer: Secondary | ICD-10-CM

## 2020-04-04 DIAGNOSIS — L97512 Non-pressure chronic ulcer of other part of right foot with fat layer exposed: Secondary | ICD-10-CM

## 2020-04-04 NOTE — Patient Instructions (Signed)
Wash wound saline and dry well. Apply a small amount of iodosorb to the wound and cover with a dry dressing.

## 2020-04-05 NOTE — Progress Notes (Signed)
Subjective: 59 year old male presents the office today for follow-up evaluation after undergoing right partial reputation with return to operating room for wound debridement, delayed primary closure.  Is able to close the entire wound except the distal aspect.  [facility] come out to change the dressing yesterday.  He is on antibiotics with any side effects. Denies any systemic complaints such as fevers, chills, nausea, vomiting. No acute changes since last appointment, and no other complaints at this time.   Objective: AAO x3, NAD DP/PT pulses palpable bilaterally, CRT less than 3 seconds Status post right partial fifth ray amputation.  Incision has sutures intact on the incision the proximal two thirds closed in the distal one third as superficial opening from was not able to completely close the wound.  There is no drainage or pus.  There is no surrounding erythema, ascending cellulitis.  The right fourth toe with minimal edema there is no erythema or warmth. No pain with calf compression, swelling, warmth, erythema  Assessment: Status post partial fifth amputation with partial delayed primary closure  Plan: -All treatment options discussed with the patient including all alternatives, risks, complications.  -Cleaned the wound today.  We will switch to using Iodosorb dressing changes daily followed by dry sterile dressing.  Dispensed at his work form today.  [facility] will [facility] given to [facility].  Continue with Darco wedge shoe.  Continue to elevate. -Finish course of antibiotics- keflex/flagyl -Vascular surgery follow up due to mild PAD -Patient encouraged to call the office with any questions, concerns, change in symptoms.   Return in about 1 week (around 04/11/2020).  Trula Slade DPM

## 2020-04-06 ENCOUNTER — Telehealth: Payer: Self-pay | Admitting: Podiatry

## 2020-04-06 ENCOUNTER — Telehealth: Payer: Self-pay | Admitting: *Deleted

## 2020-04-06 DIAGNOSIS — E08621 Diabetes mellitus due to underlying condition with foot ulcer: Secondary | ICD-10-CM

## 2020-04-06 DIAGNOSIS — E1149 Type 2 diabetes mellitus with other diabetic neurological complication: Secondary | ICD-10-CM

## 2020-04-06 DIAGNOSIS — I739 Peripheral vascular disease, unspecified: Secondary | ICD-10-CM

## 2020-04-06 NOTE — Telephone Encounter (Signed)
Referral to [facility], required form, [employer]ot with clinical and demographics faxed.

## 2020-04-06 NOTE — Telephone Encounter (Signed)
-----   Message from Trula Slade, DPM sent at 04/05/2020  8:32 AM EDT ----- Can you please refer to [facility] for mild PAD? He underwent amputation. Thanks

## 2020-04-06 NOTE — Telephone Encounter (Signed)
Faxed required form, clinicals and demographics to [employer].

## 2020-04-06 NOTE — Telephone Encounter (Signed)
-----   Message from Trula Slade, DPM sent at 04/04/2020  5:03 PM EDT ----- Can you send orders to [facility]- it will be to wash wound with saline then apply iodosorb and 4x4, kerlix, ACE. This should be done 3 x a week if possible

## 2020-04-06 NOTE — Telephone Encounter (Signed)
[facility] from [facility] called and state that they will take this pt [facility] contact # [phone]

## 2020-04-11 DIAGNOSIS — Z8631 Personal history of diabetic foot ulcer: Secondary | ICD-10-CM | POA: Insufficient documentation

## 2020-04-13 ENCOUNTER — Other Ambulatory Visit: Payer: Self-pay

## 2020-04-13 ENCOUNTER — Ambulatory Visit (INDEPENDENT_AMBULATORY_CARE_PROVIDER_SITE_OTHER): Payer: Medicare Other | Admitting: Podiatry

## 2020-04-13 ENCOUNTER — Encounter: Payer: Self-pay | Admitting: Podiatry

## 2020-04-13 DIAGNOSIS — L97512 Non-pressure chronic ulcer of other part of right foot with fat layer exposed: Secondary | ICD-10-CM

## 2020-04-13 DIAGNOSIS — I739 Peripheral vascular disease, unspecified: Secondary | ICD-10-CM

## 2020-04-13 DIAGNOSIS — E08621 Diabetes mellitus due to underlying condition with foot ulcer: Secondary | ICD-10-CM

## 2020-04-13 DIAGNOSIS — E1149 Type 2 diabetes mellitus with other diabetic neurological complication: Secondary | ICD-10-CM

## 2020-04-13 NOTE — Progress Notes (Signed)
  Subjective:  Patient ID: Brian [name], male    DOB: 03-Jun-1961,  MRN: [id number]  Chief Complaint  Patient presents with  . Diabetic Ulcer    1 week follow up right foot    59 y.o. male presents with the above complaint. History confirmed with patient. He is feeling quite well, he has had a home wound care nurse coming 3 times a week to change his dressings with Iodosorb and dry sterile dressings. Denies fevers chills nausea vomiting.  Objective:  Physical Exam: warm, good capillary refill and normal DP and PT pulses.   Right Foot: Right lateral incision healing well, sutures intact, no signs of infection. Mild hyperkeratosis surrounding the incision. Plantar to this at the residual fifth metatarsal there is a full-thickness ulceration measuring 1 cm x 1 cm x 0.5 cm, post debridement it measures 5 mm x 6 mm by 3 mm. Granular wound bed, hyperkeratosis surrounding this. No signs infection     Assessment:   1. Diabetic ulcer of other part of right foot associated with diabetes mellitus due to underlying condition, with fat layer exposed ([place])   2. Type II diabetes mellitus with neurological manifestations ([place])   3. PAD (peripheral artery disease) ([place])      Plan:  Patient was evaluated and treated and all questions answered.  -Sutures to remain intact until next week at his visit with Dr. Earleen Newport -Ulcer has reduced approximately 50% in size after debridement. See procedure note below. Healing well. -Continue Darco wedge shoe -Continue home nursing care with Iodosorb dressings changed 3 times weekly  Ulcer right foot -Debridement as below. -Dressed with Iodosorb, DSD. -Continue off-loading with Darco wedge.  Procedure: Selective debridement of Wound Rationale: Removal of non-viable soft tissue from the wound to promote healing.  Anesthesia: none Pre-Debridement Wound Measurements: 1.0 cm x 1.0 cm x 0.5 cm  Post-Debridement Wound Measurements: 0.6 cm x 0.5 cm x 0.3 cm  Type  of Debridement: Sharp Excisional Tissue Removed: Non-viable soft tissue Depth of Debridement: subcutaneous tissue. Technique: Sharp selective debridement to bleeding, viable wound base.  Dressing: Dry, sterile, compression dressing. Disposition: Patient tolerated procedure well. Patient to return in 1 week for follow-up.  Return in about 1 week (around 04/20/2020) for diabetic ulcer right foot.       Return in about 1 week (around 04/20/2020) for diabetic ulcer right foot.

## 2020-04-20 ENCOUNTER — Ambulatory Visit (INDEPENDENT_AMBULATORY_CARE_PROVIDER_SITE_OTHER): Payer: Medicare Other | Admitting: Podiatry

## 2020-04-20 ENCOUNTER — Other Ambulatory Visit: Payer: Self-pay

## 2020-04-20 DIAGNOSIS — L97512 Non-pressure chronic ulcer of other part of right foot with fat layer exposed: Secondary | ICD-10-CM | POA: Diagnosis not present

## 2020-04-20 DIAGNOSIS — E08621 Diabetes mellitus due to underlying condition with foot ulcer: Secondary | ICD-10-CM | POA: Diagnosis not present

## 2020-04-20 DIAGNOSIS — E1149 Type 2 diabetes mellitus with other diabetic neurological complication: Secondary | ICD-10-CM

## 2020-04-20 NOTE — Patient Instructions (Addendum)
Switch to using medihoney on the ulcer during dressing changes. Cover with 4x4, kerlix, ACE

## 2020-04-21 NOTE — Progress Notes (Signed)
Subjective: 59 year old male presents the office today for follow-up evaluation after undergoing right partial fifth ray amputation.  He states that he feels he is doing well he is ready to drive.  Denies any drainage or pus coming from the wound at home health has been changing the bandage.  He has finished the course of antibiotics.  Denies any systemic complaints such as fevers, chills, nausea, vomiting. No acute changes since last appointment, and no other complaints at this time.   Objective: AAO x3, NAD DP/PT pulses palpable bilaterally, CRT less than 3 seconds Status post right partial fifth ray amputation.  There is intact with Incision Well Coapted Well Approximated.  The Distal Aspect remains open has scabbed over and upon debridement there is still a superficial granular wound.  Prior to debridement of the wound there was nonviable tissue present in the wound bed.  After debridement of the wound measured 1 x 0.4 x 0.2 cm.  There is no surrounding erythema, ascending cellulitis but there is no fluctuation crepitation there is no malodor.  There is no swelling, erythema or open lesions to the fourth digit. There is no pain with calf compression, erythema or warmth  Assessment: Status post partial fifth amputation with partial delayed primary closure  Plan: -All treatment options discussed with the patient including all alternatives, risks, complications.  -I sharply debrided the wound today with a #312 blade scalpel without any complications down to healthy, bleeding, viable tissue.  We will switch to using Medihoney -Vascular surgery follow up due to mild PAD -Patient encouraged to call the office with any questions, concerns, change in symptoms.   Trula Slade DPM

## 2020-04-24 ENCOUNTER — Telehealth: Payer: Self-pay | Admitting: *Deleted

## 2020-04-24 NOTE — Telephone Encounter (Signed)
-----   Message from Trula Slade, DPM sent at 04/21/2020  7:35 AM EDT ----- Can you please change his [facility]HC orders to using medihoney and not iodosorb. Thanks.

## 2020-04-24 NOTE — Telephone Encounter (Signed)
Faxed orders of 04/21/2020 7:35am to [facility].

## 2020-04-25 ENCOUNTER — Other Ambulatory Visit: Payer: Self-pay

## 2020-04-25 ENCOUNTER — Encounter: Payer: Self-pay | Admitting: Podiatry

## 2020-04-25 ENCOUNTER — Ambulatory Visit (INDEPENDENT_AMBULATORY_CARE_PROVIDER_SITE_OTHER): Payer: Medicare Other | Admitting: Podiatry

## 2020-04-25 DIAGNOSIS — E08621 Diabetes mellitus due to underlying condition with foot ulcer: Secondary | ICD-10-CM

## 2020-04-25 DIAGNOSIS — E1149 Type 2 diabetes mellitus with other diabetic neurological complication: Secondary | ICD-10-CM | POA: Diagnosis not present

## 2020-04-25 DIAGNOSIS — L97512 Non-pressure chronic ulcer of other part of right foot with fat layer exposed: Secondary | ICD-10-CM

## 2020-04-26 NOTE — Progress Notes (Signed)
Subjective: 59 year old male presents the office today for follow-up evaluation after undergoing right partial fifth ray amputation.  He states that home health is continued to change the dressing as well.  He has not seen any increase in swelling he denies any redness or drainage of any pus.  Overall he feels well and denies any fevers, chills, nausea, vomiting.  No calf pain, chest pain or shortness of breath.   States he was told to follow with vascular in the hospital but he has not yet done so.  Denies any systemic complaints such as fevers, chills, nausea, vomiting. No acute changes since last appointment, and no other complaints at this time.   Objective: AAO x3, NAD DP/PT pulses palpable bilaterally, CRT less than 3 seconds Status post right partial fifth ray amputation.  The majority incision is well coapted without signs of dehiscence.  There is distal aspect where the wound was not able to be completely closed the ulceration still present.  After debridement measures 1 x 0.2 x 0.1 cm.  There is no probing, undermining or tunneling.  No surrounding erythema, ascending cellulitis.  No fluctuation crepitation.  There is malodor.  There is no pain with calf compression, erythema or warmth  Assessment: Status post partial fifth amputation with partial delayed primary closure  Plan: -All treatment options discussed with the patient including all alternatives, risks, complications.  -I sharply debrided the wound today with a #312 blade scalpel without any complications down to healthy, bleeding, viable tissue. Was able debride the nonviable hyperkeratotic tissue thus presents the wound as well as within the wound down to a thick tissue.  We will switch to using Medihoney -Vascular surgery follow up due to mild PAD- referral has been placed previously.  -Patient encouraged to call the office with any questions, concerns, change in symptoms.   Trula Slade DPM

## 2020-05-05 ENCOUNTER — Ambulatory Visit (INDEPENDENT_AMBULATORY_CARE_PROVIDER_SITE_OTHER): Payer: Medicare Other | Admitting: Podiatry

## 2020-05-05 ENCOUNTER — Other Ambulatory Visit: Payer: Self-pay

## 2020-05-05 DIAGNOSIS — L97512 Non-pressure chronic ulcer of other part of right foot with fat layer exposed: Secondary | ICD-10-CM

## 2020-05-05 DIAGNOSIS — E08621 Diabetes mellitus due to underlying condition with foot ulcer: Secondary | ICD-10-CM

## 2020-05-05 DIAGNOSIS — E1149 Type 2 diabetes mellitus with other diabetic neurological complication: Secondary | ICD-10-CM

## 2020-05-09 ENCOUNTER — Ambulatory Visit (INDEPENDENT_AMBULATORY_CARE_PROVIDER_SITE_OTHER): Payer: Medicare Other | Admitting: Gastroenterology

## 2020-05-09 ENCOUNTER — Other Ambulatory Visit: Payer: Medicare Other

## 2020-05-09 ENCOUNTER — Encounter: Payer: Self-pay | Admitting: Gastroenterology

## 2020-05-09 VITALS — BP 140/90 | HR 73 | Ht 69.0 in | Wt 183.0 lb

## 2020-05-09 DIAGNOSIS — K7460 Unspecified cirrhosis of liver: Secondary | ICD-10-CM | POA: Diagnosis not present

## 2020-05-09 NOTE — Progress Notes (Signed)
HPI :  59 year old male here for follow-up visit for cirrhosis. He has a history of compensated cirrhosis (cryptogenic), end-stage renal disease on hemodialysis,pancreatic disease (s/p partial pancreatectomy & splenectomy), duodenal ulcer.  I met him when he was hospitalized in April 2019 for an upper GI bleed.  EGD at that time showed gastritis along with a cratered duodenal ulcer with visible vessel which we cauterized.  Biopsies from the EGD were negative for H. pylori.  Has been on low-dose Protonix and tolerating it well.  He has been followed by [facility] hepatology clinic for cirrhosis previously.  He has a history of end-stage renal disease and was referred to [facility] for consideration of dual liver kidney transplant.  He was told he is not a candidate for this program as long as he continues to smoke cigarettes.  He states he is unwilling to stop smoking at this time. Serologic evaluation was negative for chronic viral hepatitis, autoimmune hepatitis, PBC, Wilson disease, and alpha-1 antitrypsin deficiency. Ferritin and % saturation have been non-specifically elevated, with negative HFE testing. Transjugular liver biopsy on 06/04/2018 (detailed below) demonstrated HVPG 13 mmHg (c/w CSPH) with histology demonstrating chronic active hepatitis with bridging fibrosis/cirrhosis and secondary iron accumulation.  He states he previously drank heavily on the weekends but that was several years ago, has not had any alcohol use recently.  He states he is doing really well since his last been seen. He denies any lower extremity edema, no ascites.  He has not had any jaundice.  No history of hepatic encephalopathy.  I performed an EGD for him last month as outlined, he had 1 column of a trace esophageal varix, and portal hypertensive gastritis.  Biopsies negative for H. pylori.  He states he has had some nausea recently that resolves when he eats.  He was on antibiotics recently for an infected toe which ended  up getting amputated, specifically Flagyl.  He states now off antibiotics and his nausea has been better.  He declines medication for nausea right now.  He is getting dialysis a few times a week for his renal disease.  His last ultrasound for [facility] screening was May 2020.  Last colonoscopy was in 2019 as below, he had 5 adenomas removed.  Prior workup: EGD 01/23/18 - 3 cm hiatal hernia. - Normal esophagus - Gastritis with heme noted throughout the stomach. Biopsied to rule out H pylori. - One cratered duodenal ulcer with 2 red spots, suspected vessels. Treated with a monopolar probe to prevent rebleeding.  Biopsies negative for H pylori  Colonoscopy 02/19/18 - The perianal and digital rectal examinations were normal. - A 5 mm polyp was found in the ileocecal valve. The polyp was sessile. The polyp was removed with a cold snare. Resection and retrieval were complete. - Three sessile polyps were found in the ascending colon. The polyps were 4 to 8 mm in size. These polyps were removed with a cold snare. Resection and retrieval were complete. - There was a medium-sized lipoma, in the ascending colon. - A 4 mm polyp was found in the transverse colon. The polyp was sessile. The polyp was removed with a cold snare. Resection and retrieval were complete. - A few medium-mouthed diverticula were found in the ascending colon and cecum. - Internal hemorrhoids were found during retroflexion. - The colon was extremely spastic which prolonged the procedure. The exam was otherwise without abnormality.  - all adenomas - repeat colonoscopy in 3 years  EGD 03/07/2020 -  - A  2 cm hiatal hernia was present. Z line just slightly irregular but did not meet criteria for Barrett's - One column of suspected trace varix was found in the lower third of the esophagus, easily flattened with insufflation, no high risk stigmata noted. No significant varices otherwise - The exam of the esophagus was otherwise  normal. - Portal hypertensive gastropathy was found in the gastric fundus and in the gastric body diffusely with hypertrophied gastric folds. No gastric varices noted. - The exam of the stomach was otherwise normal. - Biopsies were taken with a cold forceps in the gastric body and in the gastric antrum for Helicobacter pylori testing. - The duodenal bulb and second portion of the duodenum were normal.  Repeat EGD in 1 year  Surgical [P], gastric antrum and gastric body - REACTIVE GASTROPATHY - NO H. PYLORI OR INTESTINAL METAPLASIA IDENTIFIED - SEE COMMENT   [place] 02/16/19 - IMPRESSION:  Cirrhosis of the liver without focal mass.   Status post cholecystectomy.    Past Medical History:  Diagnosis Date   Arthritis    Bleeding ulcer    Cirrhosis ([place])    Complication of anesthesia    " I had a nose bleed a day or two after surgery-it could have been the oxygen"   Diabetes mellitus without complication ([place])    Diabetic neuropathy (HCC)    Elevated TSH    ESRD on dialysis [facility])    MWF   History of kidney stones    Hypertension    Stroke [facility])    hemorrhagic 2010   Tobacco abuse      Past Surgical History:  Procedure Laterality Date   A/V FISTULAGRAM N/A 02/05/2018   Procedure: A/V FISTULAGRAM;  Surgeon: Conrad Chamita, MD;  Location: [place] CV LAB;  Service: Cardiovascular;  Laterality: N/A;   AMPUTATION Right 03/27/2020   Procedure: AMPUTATION RAY 5th;  Surgeon: Trula Slade, DPM;  Location: [place];  Service: Podiatry;  Laterality: Right;   AV FISTULA PLACEMENT Left 05/16/2017   Procedure: ARTERIOVENOUS (AV) FISTULA CREATION-LEFT;  Surgeon: Angelia Mould, MD;  Location: [facility] OR;  Service: Vascular;  Laterality: Left;   CHOLECYSTECTOMY     COLONOSCOPY     ESOPHAGOGASTRODUODENOSCOPY N/A 01/23/2018   Procedure: ESOPHAGOGASTRODUODENOSCOPY (EGD);  Surgeon: Yetta Flock, MD;  Location: [facility] ENDOSCOPY;  Service: Gastroenterology;  Laterality:  N/A;   PANCREAS SURGERY     PERIPHERAL VASCULAR BALLOON ANGIOPLASTY  02/05/2018   Procedure: PERIPHERAL VASCULAR BALLOON ANGIOPLASTY;  Surgeon: Conrad , MD;  Location: [place] CV LAB;  Service: Cardiovascular;;  left AV fistula   SPLENECTOMY     WOUND DEBRIDEMENT Right 03/30/2020   Procedure: DEBRIDEMENT WOUND RIGHT FOOT;  Surgeon: Trula Slade, DPM;  Location: [place];  Service: Podiatry;  Laterality: Right;   Family History  Problem Relation Age of Onset   Lymphoma Mother    Hypertension Father    Diabetes Mellitus II Father    Hypertension Brother    Diabetes Mellitus I Brother    Hypertension Brother    Colon cancer Neg Hx    Stomach cancer Neg Hx    Colon polyps Neg Hx    Esophageal cancer Neg Hx    Rectal cancer Neg Hx    Social History   Tobacco Use   Smoking status: Current Every Day Smoker    Packs/day: 0.50    Years: 40.00    Pack years: 20.00    Types: Cigarettes   Smokeless tobacco:  Never Used  Vaping Use   Vaping Use: Never used  Substance Use Topics   Alcohol use: No    Comment: quit around 2010; no h/o heavy use   Drug use: No   Current Outpatient Medications  Medication Sig Dispense Refill   acetaminophen (TYLENOL) 325 MG tablet Take 650 mg by mouth every 6 (six) hours as needed.     amLODipine (NORVASC) 10 MG tablet Take 1 tablet (10 mg total) by mouth daily. 30 tablet 0   atorvastatin (LIPITOR) 40 MG tablet Take 1 tablet (40 mg total) by mouth daily. 30 tablet 0   ferric citrate (AURYXIA) 1 GM 210 MG(Fe) tablet Take 2 tablets by mouth 3 (three) times daily.     hydrALAZINE (APRESOLINE) 100 MG tablet Take 1 tablet (100 mg total) by mouth every 8 (eight) hours. 90 tablet 0   insulin aspart protamine- aspart (NOVOLOG MIX 70/30) (70-30) 100 UNIT/ML injection Inject 0.05 mLs (5 Units total) into the skin at bedtime. (Patient taking differently: Inject 5-10 Units into the skin at bedtime. Depending on blood sugar.) 10 mL 0     metoprolol tartrate (LOPRESSOR) 50 MG tablet Take 1 tablet (50 mg total) by mouth 2 (two) times daily. 60 tablet 0   ondansetron (ZOFRAN) 4 MG tablet Take 1 tablet (4 mg total) by mouth every 8 (eight) hours as needed for nausea or vomiting. 20 tablet 0   pantoprazole (PROTONIX) 20 MG tablet Take 1 tablet (20 mg total) by mouth daily. 90 tablet 3   No current facility-administered medications for this visit.   Allergies  Allergen Reactions   Penicillins Hives and Rash    Has patient had a PCN reaction causing immediate rash, facial/tongue/throat swelling, SOB or lightheadedness with hypotension: Yes Has patient had a PCN reaction causing severe rash involving mucus membranes or skin necrosis: No Has patient had a PCN reaction that required hospitalization: No Has patient had a PCN reaction occurring within the last 10 years: No If all of the above answers are "NO", then may proceed with Cephalosporin use. Ceftriaxone = no issues   Nsaids     Due to kidneys   Aspirin Nausea And Vomiting    Other Reaction: GI Upset     Review of Systems: All systems reviewed and negative except where noted in HPI.    Lab Results  Component Value Date   WBC 12.6 (H) 04/02/2020   HGB 11.3 (L) 04/02/2020   HCT 35.1 (L) 04/02/2020   MCV 103.2 (H) 04/02/2020   PLT 324 04/02/2020    Lab Results  Component Value Date   INR 1.0 03/26/2020   INR 1.0 11/04/2008    Lab Results  Component Value Date   ALT 22 03/26/2020   AST 37 03/26/2020   ALKPHOS 129 (H) 03/26/2020   BILITOT 2.0 (H) 03/26/2020    Lab Results  Component Value Date   CREATININE 7.08 (H) 04/02/2020   BUN 41 (H) 04/02/2020   NA 134 (L) 04/02/2020   K 3.7 04/02/2020   CL 98 04/02/2020   CO2 25 04/02/2020     Physical Exam: BP 140/90 (BP Location: Left Arm, Patient Position: Sitting)    Pulse 73    Ht '5\' 9"'  (1.753 m)    Wt 183 lb (83 kg)    SpO2 97%    BMI 27.02 kg/m  Constitutional: Pleasant,well-developed, male  in no acute distress. HEENT: Normocephalic and atraumatic. Conjunctivae are normal.  Neck supple.  Cardiovascular: Normal rate, regular  rhythm.  Pulmonary/chest: Effort normal and breath sounds normal. No wheezing, rales or rhonchi. Abdominal: Soft, nondistended, nontender.  Extremities: no edema Lymphadenopathy: No cervical adenopathy noted. Neurological: Alert and oriented to person place and time. No asterixis. Skin: Skin is warm and dry. No rashes noted. Psychiatric: Normal mood and affect. Behavior is normal.   ASSESSMENT AND PLAN: 59 year old male here for reassessment of the following:  Cirrhosis - he has one column of trace varix on upper endoscopy recently, no high risk stigmata, otherwise has been compensated. Will monitor this for now.  His INR is normal, platelets normal.  We discussed his cirrhosis, risks for decompensation and HCC over time.  Unclear what has caused this, he remotely drank heavily on weekends, however has not been using any alcohol recently.  He is due for [facility] screening with right upper quadrant ultrasound, will refer him for that.  He is due for a baseline AFP level.  In light of his end-stage renal disease on hemodialysis he was referred to [facility] for dual kidney liver transplant program.  In light of his tobacco use he is not a candidate for that.  We discussed his tobacco use, he is not ready to quit, when he is ready he will follow-up with the [facility].  He is followed closely by nephrology.  He is due for another EGD 1 to 2 years from his last exam for surveillance of the varices.  Otherwise he will continue to abstain from alcohol, get labs every 6 months.  I will see him again in 6 months for follow-up, or sooner with any issues.  He agreed with the plan.  Kenilworth Cellar, MD [facility]

## 2020-05-09 NOTE — Patient Instructions (Signed)
If you are age 57 or older, your body mass index should be between 23-30. Your Body mass index is 27.02 kg/m. If this is out of the aforementioned range listed, please consider follow up with your Primary Care Provider.  If you are age 41 or younger, your body mass index should be between 19-25. Your Body mass index is 27.02 kg/m. If this is out of the aformentioned range listed, please consider follow up with your Primary Care Provider.    You have been scheduled for an abdominal ultrasound at [facility] (1st floor of hospital) on Tuesday, 8-10th at 9:30 am. Please arrive 15 minutes prior to your appointment for registration. Make certain not to have anything to eat or drink 6 hours prior to your appointment. Should you need to reschedule your appointment, please contact radiology at [phone]. This test typically takes about 30 minutes to perform.   Please go to the lab in the basement of our building to have lab work done as you leave today. Hit "B" for basement when you get on the elevator.  When the doors open the lab is on your left.  We will call you with the results. Thank you.  Due to recent changes in healthcare laws, you may see the results of your imaging and laboratory studies on MyChart before your provider has had a chance to review them.  We understand that in some cases there may be results that are confusing or concerning to you. Not all laboratory results come back in the same time frame and the provider may be waiting for multiple results in order to interpret others.  Please give [place] 48 hours in order for your provider to thoroughly review all the results before contacting the office for clarification of your results.   Please follow up in 6 months. We will send you a reminder when it is time to schedule.   Thank you for entrusting me with your care and for choosing [facility], Dr. Lynndyl Cellar

## 2020-05-09 NOTE — Progress Notes (Signed)
Subjective: 59 year old male presents the office today for follow-up evaluation after undergoing right partial fifth ray amputation.  States he has been doing well he has not seen any drainage or pus or any increase in swelling or redness.  They have been using Medihoney on the wound.  He has no new concerns today.  Denies any fevers, chills, nausea, vomiting.  No calf pain, chest pain, shortness of breath. Denies any systemic complaints such as fevers, chills, nausea, vomiting. No acute changes since last appointment, and no other complaints at this time.   Objective: AAO x3, NAD DP/PT pulses palpable bilaterally, CRT less than 3 seconds Status post right partial fifth ray amputation.  On the incision there is a small scab present upon debridement appears the wound is almost completely healed particular the distal aspect is very superficial almost healed.  There is no surrounding erythema, ascending cellulitis there is no fluctuation crepitation and there is no malodor.  Overall the wound appears to be doing well. There is no pain with calf compression, erythema or warmth  Assessment: Status post partial fifth amputation with partial delayed primary closure  Plan: -All treatment options discussed with the patient including all alternatives, risks, complications.  -Sharp debridement the wound today without any complications or bleeding.  We will continue Medihoney dressing changes daily.  He can wash the foot with soap and water daily and dry thoroughly and apply the bandage.  Continue with surgical shoe and offloading encouraged elevation. -Monitor for any clinical signs or symptoms of infection and directed to call the office immediately should any occur or go to the ER.  Return in about 1 week (around 05/12/2020).  Trula Slade DPM

## 2020-05-10 LAB — AFP TUMOR MARKER: AFP-Tumor Marker: 2.8 ng/mL (ref <6.1)

## 2020-05-11 ENCOUNTER — Ambulatory Visit (INDEPENDENT_AMBULATORY_CARE_PROVIDER_SITE_OTHER): Payer: Medicare Other | Admitting: Podiatry

## 2020-05-11 ENCOUNTER — Other Ambulatory Visit: Payer: Self-pay

## 2020-05-11 DIAGNOSIS — E08621 Diabetes mellitus due to underlying condition with foot ulcer: Secondary | ICD-10-CM

## 2020-05-11 DIAGNOSIS — L97512 Non-pressure chronic ulcer of other part of right foot with fat layer exposed: Secondary | ICD-10-CM

## 2020-05-11 DIAGNOSIS — E1149 Type 2 diabetes mellitus with other diabetic neurological complication: Secondary | ICD-10-CM

## 2020-05-14 NOTE — Progress Notes (Signed)
Subjective: 59 year old male presents the office today for follow-up evaluation after undergoing right partial fifth ray amputation.  He states he is doing well he feels the wound is also improving.  He has no new issues.  Denies any fevers, chills, nausea vomiting.  No calf pain, chest pain, shortness of breath.  Objective: AAO x3, NAD DP/PT pulses palpable bilaterally, CRT less than 3 seconds Status post right partial fifth ray amputation.  On the incision there is hyperkeratotic tissue and some scab present on the entire incision.  Upon debridement it appears the wound is almost completely healed at this time.  Also there was a wound that was present submetatarsal 5 which appears to be healed at this time.  There is trace edema.  There is no surrounding erythema, ascending cellulitis there is no fluctuation crepitation peer there is no open lesions otherwise. There is no pain with calf compression, erythema or warmth  Assessment: Status post partial fifth amputation with partial delayed primary closure  Plan: -All treatment options discussed with the patient including all alternatives, risks, complications.  -Debrided the incision with hyperkeratotic tissue and scab.  Pioglitazone is completely healed.  I would continue with small amounts of antibiotic ointment on the incision daily and he can moisturize around the incision.  Wash the foot with soap and water.  Continue with surgical shoe for now.  He can transition back to the regular surgical shoe.  No follow-ups on file.  Trula Slade DPM

## 2020-05-16 ENCOUNTER — Ambulatory Visit (HOSPITAL_COMMUNITY): Payer: Medicare Other

## 2020-05-16 ENCOUNTER — Other Ambulatory Visit: Payer: Self-pay

## 2020-05-16 ENCOUNTER — Ambulatory Visit (HOSPITAL_COMMUNITY)
Admission: RE | Admit: 2020-05-16 | Discharge: 2020-05-16 | Disposition: A | Discharge status: Home / Self Care | Payer: Medicare Other | Source: Ambulatory Visit | Attending: Gastroenterology | Admitting: Gastroenterology

## 2020-05-16 DIAGNOSIS — K7460 Unspecified cirrhosis of liver: Secondary | ICD-10-CM | POA: Insufficient documentation

## 2020-05-18 ENCOUNTER — Ambulatory Visit (INDEPENDENT_AMBULATORY_CARE_PROVIDER_SITE_OTHER): Payer: Medicare Other | Admitting: Podiatry

## 2020-05-18 ENCOUNTER — Other Ambulatory Visit: Payer: Self-pay

## 2020-05-18 DIAGNOSIS — E08621 Diabetes mellitus due to underlying condition with foot ulcer: Secondary | ICD-10-CM

## 2020-05-18 DIAGNOSIS — L97512 Non-pressure chronic ulcer of other part of right foot with fat layer exposed: Secondary | ICD-10-CM

## 2020-05-18 DIAGNOSIS — E1149 Type 2 diabetes mellitus with other diabetic neurological complication: Secondary | ICD-10-CM

## 2020-05-24 NOTE — Progress Notes (Signed)
Subjective: 59 year old male presents the office today for follow-up evaluation after undergoing right partial fifth ray amputation.  He presents today for follow-up evaluation of the wound.  States overall he is been doing well and denies any increase in swelling or redness or any drainage or pus.  He has no new concerns today. Denies any fevers, chills, nausea vomiting.  No calf pain, chest pain, shortness of breath.  Objective: AAO x3, NAD DP/PT pulses palpable bilaterally, CRT less than 3 seconds Status post right partial fifth ray amputation.  Incision is well-healed except for the very distal aspect there is a scab which I debrided and superficial granular wound is identified measuring proximately 1.2 x 0.3 cm x 0.1 cm.  There is no probing, undermining or tunneling.  There is no surrounding erythema, ascending cellulitis.  There is no fluctuation crepitation.  There is no malodor. There is no pain with calf compression, erythema or warmth  Assessment: Status post partial fifth amputation with partial delayed primary closure  Plan: -All treatment options discussed with the patient including all alternatives, risks, complications.  -Sharply debrided the wound utilizing the 312 with scalpel down to healthy, bleeding, viable tissue.  Continue daily dressing changes.  He will wash the foot with soap and water and dry thoroughly.  Continue with surgical shoe for now.  Elevation and still turn on the conceiving till completely healed.  Trula Slade DPM

## 2020-05-25 ENCOUNTER — Ambulatory Visit (INDEPENDENT_AMBULATORY_CARE_PROVIDER_SITE_OTHER): Payer: Medicare Other | Admitting: Podiatry

## 2020-05-25 ENCOUNTER — Other Ambulatory Visit: Payer: Self-pay

## 2020-05-25 DIAGNOSIS — E1149 Type 2 diabetes mellitus with other diabetic neurological complication: Secondary | ICD-10-CM

## 2020-05-25 DIAGNOSIS — L97512 Non-pressure chronic ulcer of other part of right foot with fat layer exposed: Secondary | ICD-10-CM

## 2020-05-25 DIAGNOSIS — E08621 Diabetes mellitus due to underlying condition with foot ulcer: Secondary | ICD-10-CM

## 2020-05-28 NOTE — Progress Notes (Signed)
Subjective: 59 year old male presents the office today for follow-up evaluation after undergoing right partial fifth ray amputation.  States he is doing well he has not seen any increase in swelling or redness or drainage or pus he has no new concerns today.  Denies any fevers, chills, nausea, vomiting.  No calf pain, chest pain, shortness of breath.  Objective: AAO x3, NAD DP/PT pulses palpable bilaterally, CRT less than 3 seconds Status post right partial fifth ray amputation.  Still hyperkeratotic lesion the distal aspect incision upon debridement the wound is smaller today dimensions 0.6 x 0.3 by 0.1 cm after debridement.  No probing, undermining or tunneling.  There is no surrounding erythema, ascending cellulitis there is no drainage or pus or signs of infection noted today. There is no pain with calf compression, erythema or warmth  Assessment: Status post partial fifth amputation with partial delayed primary closure  Plan: -All treatment options discussed with the patient including all alternatives, risks, complications.  -There is an area distally that has scabbed over and upon debridement still superficial wound present.  I debrided the wound today lasting for 312 with scalpel down to healthy, bleeding, granular tissue.  This is getting smaller.  Continue daily dressing changes and discussed washing with soap and water daily.  Trula Slade DPM

## 2020-05-30 ENCOUNTER — Telehealth: Payer: Self-pay

## 2020-05-30 NOTE — Telephone Encounter (Signed)
Sharyn Lull the nurse from [place] call today for pt and wanted to see if she could get an verbal order to continue once a week wound assistance for the next 4 weeks. Please advise

## 2020-05-31 NOTE — Telephone Encounter (Signed)
Yes that is Ok to continue and continue current dressing changes.

## 2020-06-01 ENCOUNTER — Other Ambulatory Visit: Payer: Self-pay

## 2020-06-01 ENCOUNTER — Ambulatory Visit (INDEPENDENT_AMBULATORY_CARE_PROVIDER_SITE_OTHER): Payer: Medicare Other | Admitting: Podiatry

## 2020-06-01 DIAGNOSIS — E08621 Diabetes mellitus due to underlying condition with foot ulcer: Secondary | ICD-10-CM

## 2020-06-01 DIAGNOSIS — L97512 Non-pressure chronic ulcer of other part of right foot with fat layer exposed: Secondary | ICD-10-CM | POA: Diagnosis not present

## 2020-06-01 DIAGNOSIS — E1149 Type 2 diabetes mellitus with other diabetic neurological complication: Secondary | ICD-10-CM | POA: Diagnosis not present

## 2020-06-01 MED ORDER — ONDANSETRON HCL 4 MG PO TABS: 4.0000 mg | ORAL_TABLET | Freq: Three times a day (TID) | ORAL | 0 refills | Status: DC | PRN

## 2020-06-01 MED ORDER — CEPHALEXIN 500 MG PO CAPS: 500.0000 mg | ORAL_CAPSULE | Freq: Two times a day (BID) | ORAL | 0 refills | Status: DC

## 2020-06-03 NOTE — Progress Notes (Signed)
Subjective: 59 year old male presents the office today for follow-up evaluation after undergoing right partial fifth ray amputation.  States that he is doing better.  Denies any drainage or pus any swelling or redness.  No red streaks.  He has been changing the bandage daily. Denies any fevers, chills, nausea, vomiting.  No calf pain, chest pain, shortness of breath.  Objective: AAO x3, NAD DP/PT pulses palpable bilaterally, CRT less than 3 seconds Status post right partial fifth ray amputation.  Mild hyperkeratotic tissue present distal aspect of the incision on the area the wound.  After debridement the wound is still present although smaller today measuring 0.5 x 0.2 cm and superficial with a cleaner wound base.  There is no surrounding erythema, ascending cellulitis peer there is no drainage or pus there is no fluctuation crepitation.  There is mild macerated tissue on the periphery of the wound after debridement of the hyperkeratotic tissue. There is no pain with calf compression, erythema or warmth  Assessment: Status post partial fifth amputation with partial delayed primary closure  Plan: -All treatment options discussed with the patient including all alternatives, risks, complications.  -Summary debrided hyperkeratotic tissue in the wound today without any complications.  The wound is still evident and there is some mild macerated tissue.  Given the wound has been open with reorder Keflex for him.  Continue daily dressing changes a small amount of Iodosorb daily. -Monitor for any clinical signs or symptoms of infection and directed to call the office immediately should any occur or go to the ER.  No follow-ups on file.  Trula Slade DPM

## 2020-06-15 ENCOUNTER — Other Ambulatory Visit: Payer: Self-pay

## 2020-06-15 ENCOUNTER — Ambulatory Visit (INDEPENDENT_AMBULATORY_CARE_PROVIDER_SITE_OTHER): Payer: Medicare Other | Admitting: Podiatry

## 2020-06-15 DIAGNOSIS — E08621 Diabetes mellitus due to underlying condition with foot ulcer: Secondary | ICD-10-CM | POA: Diagnosis not present

## 2020-06-15 DIAGNOSIS — E1149 Type 2 diabetes mellitus with other diabetic neurological complication: Secondary | ICD-10-CM

## 2020-06-15 DIAGNOSIS — L97512 Non-pressure chronic ulcer of other part of right foot with fat layer exposed: Secondary | ICD-10-CM

## 2020-06-20 ENCOUNTER — Other Ambulatory Visit: Payer: Self-pay | Admitting: Gastroenterology

## 2020-06-25 NOTE — Progress Notes (Signed)
Subjective: 59 year old male presents the office today for follow-up evaluation after undergoing right partial fifth ray amputation.  States he is doing well he has not seen any drainage or pus or no swelling or redness.  He has no pain he feels that the wound is "getting better".  Denies any fevers, chills, nausea, vomiting.  No calf pain, chest pain, shortness of breath.  Last blood sugar recheck was 150.  A1c is unknown.  Objective: AAO x3, NAD DP/PT pulses palpable bilaterally, CRT less than 3 seconds Status post right partial fifth ray amputation.  Hyperkeratotic tissue the distal aspect of the incision.  Upon debridement still superficial wounds after measuring 0.3 x 0.2 cm and there is no probing to bone, undermining or tunneling.  There is no surrounding erythema, ascending cellulitis there is no fluctuation crepitation but there is no malodor. There is no pain with calf compression, erythema or warmth  Assessment: Status post partial fifth amputation with partial delayed primary closure  Plan: -All treatment options discussed with the patient including all alternatives, risks, complications.  -Sharply debrided hyperkeratotic tissue in the wound today without any complications to reveal the underlying ulceration.  Is able to debride the wound with a 312 with scalpel down to healthy, viable tissue.  Wound continue with daily dressing changes with Iodosorb.  #9 surgical shoe, elevation.  Monitor for any signs or symptoms of infection. -Discussed vascular surgery consult but he wants to hold off.  This was initially placed after the hospital discharge. -Monitor for any clinical signs or symptoms of infection and directed to call the office immediately should any occur or go to the ER.  Trula Slade DPM

## 2020-06-29 ENCOUNTER — Other Ambulatory Visit: Payer: Self-pay

## 2020-06-29 ENCOUNTER — Ambulatory Visit (INDEPENDENT_AMBULATORY_CARE_PROVIDER_SITE_OTHER): Payer: Medicare Other | Admitting: Podiatry

## 2020-06-29 ENCOUNTER — Ambulatory Visit (INDEPENDENT_AMBULATORY_CARE_PROVIDER_SITE_OTHER): Payer: Medicare Other | Admitting: Orthotics

## 2020-06-29 DIAGNOSIS — I739 Peripheral vascular disease, unspecified: Secondary | ICD-10-CM

## 2020-06-29 DIAGNOSIS — E08621 Diabetes mellitus due to underlying condition with foot ulcer: Secondary | ICD-10-CM | POA: Diagnosis not present

## 2020-06-29 DIAGNOSIS — E1149 Type 2 diabetes mellitus with other diabetic neurological complication: Secondary | ICD-10-CM

## 2020-06-29 DIAGNOSIS — L97512 Non-pressure chronic ulcer of other part of right foot with fat layer exposed: Secondary | ICD-10-CM

## 2020-06-29 NOTE — Progress Notes (Signed)

## 2020-07-03 NOTE — Progress Notes (Signed)
Subjective: 59 year old male presents the office today for follow-up evaluation after undergoing right partial fifth ray amputation.  States that the wound appears to be healed and there is no swelling or redness or any drainage that he reports.  Picked up diabetic shoes today.  He has no other concerns today.  Denies any fevers, chills, nausea, vomiting.  No calf pain, chest pain, shortness of breath.  Objective: AAO x3, NAD DP/PT pulses palpable bilaterally, CRT less than 3 seconds Status post right partial fifth ray amputation.  Hyperkeratotic tissue the distal aspect incision but upon debridement today appears of the wound is healed.  There is no edema, erythema, drainage or pus or any signs of infection.  No ascending cellulitis.  No fluctuation crepitation.  No malodor. There is no pain with calf compression, erythema or warmth  Assessment: Status post partial fifth amputation with partial delayed primary closure  Plan: -All treatment options discussed with the patient including all alternatives, risks, complications.  -Debrided the hyperkeratotic tissue with any complications to reveal that the wound is healed.  I recommended moisturizer to the incision daily.  He picked up diabetic shoes today and break instructions were discussed.  Discussed importance of daily foot inspection.  Trula Slade DPM

## 2020-07-05 ENCOUNTER — Other Ambulatory Visit: Payer: Self-pay | Admitting: Podiatry

## 2020-07-05 ENCOUNTER — Telehealth: Payer: Self-pay

## 2020-07-05 MED ORDER — DOXYCYCLINE HYCLATE 100 MG PO TABS
100.0000 mg | ORAL_TABLET | Freq: Two times a day (BID) | ORAL | 0 refills | Status: DC
Start: 2020-07-05 — End: 2021-01-30

## 2020-07-05 NOTE — Telephone Encounter (Signed)
I called patient back. He states the diabetic shoes caused a spot on the left big toe. There is a small wound and localized redness. No red streaking. Ordered doxycycline. Recommended to apply antibiotic ointment and bandage. He is scheduled to come in tomorrow for me to evaluate.

## 2020-07-05 NOTE — Telephone Encounter (Signed)
Pt has a place on his left foot. The area is getting infected. Pt would like an antibiotics called in for this issue. Please advise.

## 2020-07-06 ENCOUNTER — Ambulatory Visit: Payer: Medicare Other | Admitting: Orthotics

## 2020-07-06 ENCOUNTER — Ambulatory Visit (INDEPENDENT_AMBULATORY_CARE_PROVIDER_SITE_OTHER): Payer: Medicare Other | Admitting: Podiatry

## 2020-07-06 ENCOUNTER — Other Ambulatory Visit: Payer: Self-pay

## 2020-07-06 DIAGNOSIS — E1149 Type 2 diabetes mellitus with other diabetic neurological complication: Secondary | ICD-10-CM

## 2020-07-06 DIAGNOSIS — I739 Peripheral vascular disease, unspecified: Secondary | ICD-10-CM

## 2020-07-06 DIAGNOSIS — L97522 Non-pressure chronic ulcer of other part of left foot with fat layer exposed: Secondary | ICD-10-CM

## 2020-07-06 DIAGNOSIS — L97512 Non-pressure chronic ulcer of other part of right foot with fat layer exposed: Secondary | ICD-10-CM

## 2020-07-06 MED ORDER — CEPHALEXIN 500 MG PO CAPS: 500.0000 mg | ORAL_CAPSULE | Freq: Two times a day (BID) | ORAL | 0 refills | Status: DC

## 2020-07-06 NOTE — Progress Notes (Signed)
Reorder shoe Dr. Gibson Ramp  [phone]

## 2020-07-06 NOTE — Progress Notes (Signed)
Subjective: 59 year old male presents the office today for concerns of a new wound on his left big toe, dorsal MPJ.  He said he wears diabetic shoes he says it rubbed a spot.  He previously noticed a couple days ago when he states that he had infection come out of the area.  He states that now he presses on it he is not getting any pus.  He states the redness is improving.  He did call and I sent over antibiotic yesterday but he is not able to get this as it was not covered by his insurance.  He currently denies any systemic concerns including fevers, chills, nausea, vomiting.  No calf pain, chest pain or shortness of breath.  Right foot is been doing well.  He has follow-up scheduled with his next week.  Objective: AAO x3, NAD DP/PT pulses palpable bilaterally, CRT less than 3 seconds Sensation decreased with Semmes Weinstein monofilament Dorsal spurring present of the left first MPJ Ulceration is evident which probes close to bone.  There is localized from the erythema there is no ascending cellulitis or warmth to the foot.  No significant swelling.  There is no malodor.  There is no drainage or pus.  No fluctuation or crepitation and there is no malodor. Minimal hyperkeratotic tissue along the right foot from prior surgery but there is no ulceration identified there is no signs of infection. No pain with calf compression, swelling, warmth, erythema  Assessment: New ulceration with localized cellulitis left foot  Plan: -All treatment options discussed with the patient including all alternatives, risks, complications.  -Probably debrided the wound to the any complications.  There is no signs of purulence or abscess.  Given the erythema prescribed Keflex as he did well with this previously.  Encouraged offloading.  Monitoring signs or symptoms of worsening infection. -Right foot is doing well.  Continue offloading. Liliane Channel our pedorthotist saw the patient today to evaluate the shoes and new shoes  were ordered for him. -Patient encouraged to call the office with any questions, concerns, change in symptoms.   Trula Slade DPM

## 2020-07-13 ENCOUNTER — Ambulatory Visit: Payer: Medicare Other | Admitting: Podiatry

## 2020-07-13 ENCOUNTER — Ambulatory Visit (INDEPENDENT_AMBULATORY_CARE_PROVIDER_SITE_OTHER): Payer: Medicare Other | Admitting: Podiatry

## 2020-07-13 ENCOUNTER — Ambulatory Visit (INDEPENDENT_AMBULATORY_CARE_PROVIDER_SITE_OTHER): Payer: Medicare Other

## 2020-07-13 ENCOUNTER — Ambulatory Visit: Payer: Medicare Other | Admitting: Orthotics

## 2020-07-13 ENCOUNTER — Other Ambulatory Visit: Payer: Self-pay

## 2020-07-13 ENCOUNTER — Encounter: Payer: Self-pay | Admitting: Podiatry

## 2020-07-13 VITALS — Temp 97.5°F

## 2020-07-13 DIAGNOSIS — E08621 Diabetes mellitus due to underlying condition with foot ulcer: Secondary | ICD-10-CM | POA: Diagnosis not present

## 2020-07-13 DIAGNOSIS — L03116 Cellulitis of left lower limb: Secondary | ICD-10-CM

## 2020-07-13 DIAGNOSIS — E1149 Type 2 diabetes mellitus with other diabetic neurological complication: Secondary | ICD-10-CM

## 2020-07-13 DIAGNOSIS — L97512 Non-pressure chronic ulcer of other part of right foot with fat layer exposed: Secondary | ICD-10-CM | POA: Diagnosis not present

## 2020-07-13 MED ORDER — MUPIROCIN 2 % EX OINT: 1.0000 "application " | TOPICAL_OINTMENT | Freq: Two times a day (BID) | CUTANEOUS | 2 refills | Status: DC

## 2020-07-13 MED ORDER — CEPHALEXIN 500 MG PO CAPS: 500.0000 mg | ORAL_CAPSULE | Freq: Two times a day (BID) | ORAL | 0 refills | Status: DC

## 2020-07-13 NOTE — Progress Notes (Signed)
Needs x wide [phone]  Dr. Gibson Ramp surefit.

## 2020-07-13 NOTE — Progress Notes (Signed)
Subjective: 59 year old male foot, left dorsal MPJ.  He states he took the bandage off this morning and noticed some pus.  Still red around the area.  He also was using a Band-Aid and peeled some skin off lateral to this area.  He is having no pain. Denies any systemic complaints such as fevers, chills, nausea, vomiting. No acute changes since last appointment, and no other complaints at this time.   Objective: AAO x3, NAD DP/PT pulses palpable bilaterally, CRT less than 3 seconds Sensation decreased with Semmes Weinstein monofilament  On the dorsal aspect of the left first MPJ ulceration measuring 0.4 x 0.4 x 0.4 centimeters and does probe to bone.  There is localized edema around the area as well as erythema.  There is purulence identified in the wound today.  There is no ascending cellulitis.  Mild edema to the foot.  There is no fluctuation or crepitation.  No pain with calf compression, swelling, warmth, erythema  Assessment: 59 year old male with left foot ulceration, cellulitis-concern for osteomyelitis  Plan: -All treatment options discussed with the patient including all alternatives, risks, complications.  -X-rays obtained and reviewed.  No definitive cortical changes identified today but soft tissue defect is noted.  No soft tissue emphysema. -Sharply debrided the wound utilizing the 312 with scalpel down to healthy, granular, bleeding tissue.  Wound switch to mupirocin ointment dressing change that I prescribed today as well as Keflex twice daily. -Wound culture obtained -Continue offloading, elevation.  Recommend surgical shoe.  He is wearing a regular shoe today. -Discussed visiting signs or symptoms of worsening infection report to the emergency department -Patient encouraged to call the office with any questions, concerns, change in symptoms.   Trula Slade DPM

## 2020-07-16 LAB — WOUND CULTURE
MICRO NUMBER:: 11044520
RESULT:: NO GROWTH
SPECIMEN QUALITY:: ADEQUATE

## 2020-07-18 ENCOUNTER — Other Ambulatory Visit: Payer: Self-pay

## 2020-07-18 ENCOUNTER — Encounter: Payer: Self-pay | Admitting: Podiatry

## 2020-07-18 ENCOUNTER — Ambulatory Visit (INDEPENDENT_AMBULATORY_CARE_PROVIDER_SITE_OTHER): Payer: Medicare Other | Admitting: Podiatry

## 2020-07-18 DIAGNOSIS — E11610 Type 2 diabetes mellitus with diabetic neuropathic arthropathy: Secondary | ICD-10-CM | POA: Diagnosis not present

## 2020-07-18 DIAGNOSIS — E1149 Type 2 diabetes mellitus with other diabetic neurological complication: Secondary | ICD-10-CM

## 2020-07-18 DIAGNOSIS — L97522 Non-pressure chronic ulcer of other part of left foot with fat layer exposed: Secondary | ICD-10-CM | POA: Diagnosis not present

## 2020-07-18 DIAGNOSIS — E08621 Diabetes mellitus due to underlying condition with foot ulcer: Secondary | ICD-10-CM

## 2020-07-18 DIAGNOSIS — M2012 Hallux valgus (acquired), left foot: Secondary | ICD-10-CM

## 2020-07-18 DIAGNOSIS — M21612 Bunion of left foot: Secondary | ICD-10-CM

## 2020-07-18 DIAGNOSIS — M205X2 Other deformities of toe(s) (acquired), left foot: Secondary | ICD-10-CM

## 2020-07-18 NOTE — Progress Notes (Signed)
  Subjective:  Patient ID: Brian [name], male    DOB: Jun 23, 1961,  MRN: [id number]  Chief Complaint  Patient presents with  . Diabetic Ulcer    Left foot 1 week follow up    59 y.o. male presents with the above complaint. History confirmed with patient.  He has been applying mupirocin ointment daily and has been leaving it open to air more often.  Objective:  Physical Exam: warm, good capillary refill and normal DP and PT pulses.  3 mm punctate ulceration over the dorsal medial first MPJ over a large osteophyte.  Capsule is exposed.  No cellulitis.  Mild serous drainage.  Assessment:   1. Diabetic ulcer of other part of left foot associated with diabetes mellitus due to underlying condition, with fat layer exposed ([name])   2. Type II diabetes mellitus with neurological manifestations ([facility])   3. Hallux valgus with bunions, left   4. Hallux limitus, left   5. Charcot's arthropathy associated with type 2 diabetes mellitus ([name])      Plan:  Patient was evaluated and treated and all questions answered.  Patient educated on diabetes. Discussed proper diabetic foot care and discussed risks and complications of disease. Educated patient in depth on reasons to return to the office immediately should he/she discover anything concerning or new on the feet. All questions answered. Discussed proper shoes as well.   -Dressing applied consisting of medihoney -Offload ulcer with surgical shoe -Wound cleansed and debrided Procedure: Selective Debridement of Wound Rationale: Removal of devitalized tissue from the wound to promote healing.  Pre-Debridement Wound Measurements: 0.3 cm x 0.3 cm x 0.1 cm  Post-Debridement Wound Measurements: same as pre-debridement. Type of Debridement: sharp selective Tissue Removed: Devitalized soft-tissue Dressing: Dry, sterile, compression dressing. Disposition: Patient tolerated procedure well. Patient to return in 1 week for follow-up.   Return in about 1  week (around 07/25/2020) for  , wound re-check with Dr Jacqualyn Posey.

## 2020-07-25 ENCOUNTER — Other Ambulatory Visit: Payer: Self-pay

## 2020-07-25 ENCOUNTER — Ambulatory Visit (INDEPENDENT_AMBULATORY_CARE_PROVIDER_SITE_OTHER): Payer: Medicare Other | Admitting: Vascular Surgery

## 2020-07-25 ENCOUNTER — Encounter: Payer: Self-pay | Admitting: Vascular Surgery

## 2020-07-25 VITALS — BP 143/90 | HR 65 | Temp 98.6°F | Resp 16 | Wt 183.0 lb

## 2020-07-25 DIAGNOSIS — Z8631 Personal history of diabetic foot ulcer: Secondary | ICD-10-CM | POA: Diagnosis not present

## 2020-07-25 NOTE — Progress Notes (Signed)
Patient name: DECKLYN HYDER MRN: [id number] DOB: [birth year] Sex: male  REASON FOR CONSULT: Evaluate and treat for PAD  HPI: OKECHUKWU REGNIER is a 59 y.o. male, with history of hypertension, diabetes, end-stage renal disease, cirrhosis that presents for evaluation of PAD as a referral from Dr. Earleen Newport with podiatry. Patient states he initially had a ray amputation of the right fifth toe back in June. He states ultimately this healed without any issue. He was put in a diabetic shoe and subsequently rubbed a ulcer on his left metatarsal head. He feels this has been present for about 2 to 3 weeks. Feels this is healing without any issue. He is not having any pain in the feet. He is on dialysis but otherwise has had no lower extremity interventions.  No leg pain when he walks.  Past Medical History:  Diagnosis Date  . Arthritis   . Bleeding ulcer   . Cirrhosis ([place])   . Complication of anesthesia    " I had a nose bleed a day or two after surgery-it could have been the oxygen"  . Diabetes mellitus without complication ([place])   . Diabetic neuropathy ([place])   . Elevated TSH   . ESRD on dialysis [facility])    MWF  . History of kidney stones   . Hypertension   . Stroke [facility])    hemorrhagic 2010  . Tobacco abuse     Past Surgical History:  Procedure Laterality Date  . A/V FISTULAGRAM N/A 02/05/2018   Procedure: A/V FISTULAGRAM;  Surgeon: Conrad Tappahannock, MD;  Location: [place] CV [facility];  Service: Cardiovascular;  Laterality: N/A;  . AMPUTATION Right 03/27/2020   Procedure: AMPUTATION RAY 5th;  Surgeon: Trula Slade, DPM;  Location: [place];  Service: Podiatry;  Laterality: Right;  . AV FISTULA PLACEMENT Left 05/16/2017   Procedure: ARTERIOVENOUS (AV) FISTULA CREATION-LEFT;  Surgeon: Angelia Mould, MD;  Location: [place];  Service: Vascular;  Laterality: Left;  . CHOLECYSTECTOMY    . COLONOSCOPY    . ESOPHAGOGASTRODUODENOSCOPY N/A 01/23/2018   Procedure: ESOPHAGOGASTRODUODENOSCOPY (EGD);   Surgeon: Yetta Flock, MD;  Location: [facility] ENDOSCOPY;  Service: Gastroenterology;  Laterality: N/A;  . PANCREAS SURGERY    . PERIPHERAL VASCULAR BALLOON ANGIOPLASTY  02/05/2018   Procedure: PERIPHERAL VASCULAR BALLOON ANGIOPLASTY;  Surgeon: Conrad [place], MD;  Location: [place] CV [facility];  Service: Cardiovascular;;  left AV fistula  . SPLENECTOMY    . WOUND DEBRIDEMENT Right 03/30/2020   Procedure: DEBRIDEMENT WOUND RIGHT FOOT;  Surgeon: Trula Slade, DPM;  Location: [place];  Service: Podiatry;  Laterality: Right;    Family History  Problem Relation Age of Onset  . Lymphoma Mother   . Hypertension Father   . Diabetes Mellitus II Father   . Hypertension Brother   . Diabetes Mellitus I Brother   . Hypertension Brother   . Colon cancer Neg Hx   . Stomach cancer Neg Hx   . Colon polyps Neg Hx   . Esophageal cancer Neg Hx   . Rectal cancer Neg Hx     SOCIAL HISTORY: Social History   Socioeconomic History  . Marital status: Widowed    Spouse name: Not on file  . Number of children: Not on file  . Years of education: Not on file  . Highest education level: Not on file  Occupational History  . Occupation: disability  Tobacco Use  . Smoking status: Current Every Day Smoker    Packs/day:  0.50    Years: 40.00    Pack years: 20.00    Types: Cigarettes  . Smokeless tobacco: Never Used  Vaping Use  . Vaping Use: Never used  Substance and Sexual Activity  . Alcohol use: No    Comment: quit around 2010; no h/o heavy use  . Drug use: No  . Sexual activity: Not on file  Other Topics Concern  . Not on file  Social History Narrative  . Not on file   Social Determinants of Health   Financial Resource Strain:   . Difficulty of Paying Living Expenses: Not on file  Food Insecurity:   . Worried About Charity fundraiser in the Last Year: Not on file  . Ran Out of Food in the Last Year: Not on file  Transportation Needs:   . Lack of Transportation (Medical): Not on file    . Lack of Transportation (Non-Medical): Not on file  Physical Activity:   . Days of Exercise per Week: Not on file  . Minutes of Exercise per Session: Not on file  Stress:   . Feeling of Stress : Not on file  Social Connections:   . Frequency of Communication with Friends and Family: Not on file  . Frequency of Social Gatherings with Friends and Family: Not on file  . Attends Religious Services: Not on file  . Active Member of Clubs or Organizations: Not on file  . Attends Archivist Meetings: Not on file  . Marital Status: Not on file  Intimate Partner Violence:   . Fear of Current or Ex-Partner: Not on file  . Emotionally Abused: Not on file  . Physically Abused: Not on file  . Sexually Abused: Not on file    Allergies  Allergen Reactions  . Penicillins Hives and Rash    Has patient had a PCN reaction causing immediate rash, facial/tongue/throat swelling, SOB or lightheadedness with hypotension: Yes Has patient had a PCN reaction causing severe rash involving mucus membranes or skin necrosis: No Has patient had a PCN reaction that required hospitalization: No Has patient had a PCN reaction occurring within the last 10 years: No If all of the above answers are "NO", then may proceed with Cephalosporin use. Ceftriaxone = no issues  . Nsaids     Due to kidneys  . Aspirin Nausea And Vomiting    Other Reaction: GI Upset    Current Outpatient Medications  Medication Sig Dispense Refill  . acetaminophen (TYLENOL) 325 MG tablet Take 650 mg by mouth every 6 (six) hours as needed.    Marland Kitchen amLODipine (NORVASC) 10 MG tablet Take 1 tablet (10 mg total) by mouth daily. 30 tablet 0  . cephALEXin (KEFLEX) 500 MG capsule Take 1 capsule (500 mg total) by mouth 2 (two) times daily. 20 capsule 0  . ferric citrate (AURYXIA) 1 GM 210 MG(Fe) tablet Take 2 tablets by mouth 3 (three) times daily.    . insulin aspart protamine- aspart (NOVOLOG MIX 70/30) (70-30) 100 UNIT/ML injection Inject  0.05 mLs (5 Units total) into the skin at bedtime. (Patient taking differently: Inject 5-10 Units into the skin at bedtime. Depending on blood sugar.) 10 mL 0  . metoprolol tartrate (LOPRESSOR) 50 MG tablet Take 1 tablet (50 mg total) by mouth 2 (two) times daily. 60 tablet 0  . mupirocin ointment (BACTROBAN) 2 % Apply 1 application topically 2 (two) times daily. 30 g 2  . pantoprazole (PROTONIX) 20 MG tablet Take 1 tablet by mouth  once daily 90 tablet 1  . atorvastatin (LIPITOR) 40 MG tablet Take 1 tablet (40 mg total) by mouth daily. (Patient not taking: Reported on 07/25/2020) 30 tablet 0  . doxercalciferol (HECTOROL) 0.5 MCG capsule Doxercalciferol (Hectorol) (Patient not taking: Reported on 07/25/2020)    . doxycycline (VIBRA-TABS) 100 MG tablet Take 1 tablet (100 mg total) by mouth 2 (two) times daily. (Patient not taking: Reported on 07/25/2020) 20 tablet 0  . hydrALAZINE (APRESOLINE) 100 MG tablet Take 1 tablet (100 mg total) by mouth every 8 (eight) hours. (Patient not taking: Reported on 07/25/2020) 90 tablet 0  . ondansetron (ZOFRAN) 4 MG tablet Take 1 tablet (4 mg total) by mouth every 8 (eight) hours as needed for nausea or vomiting. (Patient not taking: Reported on 07/25/2020) 20 tablet 0  . ondansetron (ZOFRAN) 4 MG tablet Take 1 tablet (4 mg total) by mouth every 8 (eight) hours as needed for nausea or vomiting. (Patient not taking: Reported on 07/25/2020) 20 tablet 0   No current facility-administered medications for this visit.    REVIEW OF SYSTEMS:  [X]  denotes positive finding, [ ]  denotes negative finding Cardiac  Comments:  Chest pain or chest pressure:    Shortness of breath upon exertion:    Short of breath when lying flat:    Irregular heart rhythm:        Vascular    Pain in calf, thigh, or hip brought on by ambulation:    Pain in feet at night that wakes you up from your sleep:     Blood clot in your veins:    Leg swelling:         Pulmonary    Oxygen at home:     Productive cough:     Wheezing:         Neurologic    Sudden weakness in arms or legs:     Sudden numbness in arms or legs:     Sudden onset of difficulty speaking or slurred speech:    Temporary loss of vision in one eye:     Problems with dizziness:         Gastrointestinal    Blood in stool:     Vomited blood:         Genitourinary    Burning when urinating:     Blood in urine:        Psychiatric    Major depression:         Hematologic    Bleeding problems:    Problems with blood clotting too easily:        Skin    Rashes or ulcers:        Constitutional    Fever or chills:      PHYSICAL EXAM: Vitals:   07/25/20 1012  BP: (!) 143/90  Pulse: 65  Resp: 16  Temp: 98.6 F (37 C)  TempSrc: Temporal  SpO2: 98%  Weight: 183 lb (83 kg)    GENERAL: The patient is a well-nourished male, in no acute distress. The vital signs are documented above. CARDIAC: There is a regular rate and rhythm.  VASCULAR:  Palpable femoral pulses bilaterally Palpable dorsalis pedis posterior tibial pulses bilaterally Ulcer left metatarsal head as pictured below. PULMONARY: No respiratory distress. ABDOMEN: Soft and non-tender.  MUSCULOSKELETAL: There are no major deformities or cyanosis. NEUROLOGIC: No focal weakness or paresthesias are detected. SKIN: There are no ulcers or rashes noted. PSYCHIATRIC: The patient has a normal affect.  DATA:   ABIs on 04/01/2020 are 1 on the right with a triphasic waveform at the ankle and 1.24 on the left also with a normal triphasic waveform at the ankle  Assessment/Plan:  59 year old male with diabetes, end-stage renal disease, hypertension cirrhosis that presents for evaluation of PAD. Ultimately he had a right fifth ray amputation that healed this summer with podiatry. He has a new wound on his left metatarsal head as pictured above. He feels this is healing and given he has palpable pulses on exam with a normal triphasic waveform  the ankle, I anticipate he should be able to heal this without issue. Seems like this was rubbed from diabetic shoe several weeks ago. I offered follow-up just to ensure complete healing but he would rather call if there is any concerns.   Marty Heck, MD [facility] Office: [phone]

## 2020-07-27 ENCOUNTER — Ambulatory Visit (INDEPENDENT_AMBULATORY_CARE_PROVIDER_SITE_OTHER): Payer: Medicare Other | Admitting: Podiatry

## 2020-07-27 ENCOUNTER — Other Ambulatory Visit: Payer: Self-pay

## 2020-07-27 DIAGNOSIS — E1149 Type 2 diabetes mellitus with other diabetic neurological complication: Secondary | ICD-10-CM | POA: Diagnosis not present

## 2020-07-27 DIAGNOSIS — E08621 Diabetes mellitus due to underlying condition with foot ulcer: Secondary | ICD-10-CM

## 2020-07-27 DIAGNOSIS — L97522 Non-pressure chronic ulcer of other part of left foot with fat layer exposed: Secondary | ICD-10-CM | POA: Diagnosis not present

## 2020-08-02 NOTE — Progress Notes (Signed)
Subjective: 59 year old male presents the office today for follow-up evaluation of ulceration of the left foot.  He states that the wound is doing better and it is healing.  He has follow-up with vascular surgery since I last saw him.  Denies any drainage or pus drainage swelling or redness.  He has no other concerns today. Denies any systemic complaints such as fevers, chills, nausea, vomiting. No acute changes since last appointment, and no other complaints at this time.   Objective: AAO x3, NAD DP/PT pulses palpable bilaterally, CRT less than 3 seconds Sensation decreased with Semmes Weinstein monofilament  On the dorsal aspect of the left first MPJ ulceration measuring 0.4 x 0.3 x 0.3 centimeters however is more superficial probing to bone today.  Normal surrounding erythema there is no ascending cellulitis there is no drainage or pus or any  fluctuation crepitation peer there is no malodor.   Wound on the right foot is healed. No pain with calf compression, swelling, warmth, erythema  Assessment: 59 year old male with left foot ulceration  Plan: -All treatment options discussed with the patient including all alternatives, risks, complications.  -Sharp debridement today was #312 with scalpel any complications none healthy, viable tissue.  We will do Iodosorb dressing changes daily.  Continue offloading.  Hold off on dispensing diabetic shoes today given ulceration.  Trula Slade DPM

## 2020-08-10 ENCOUNTER — Ambulatory Visit (INDEPENDENT_AMBULATORY_CARE_PROVIDER_SITE_OTHER): Payer: Medicare Other | Admitting: Podiatry

## 2020-08-10 ENCOUNTER — Telehealth: Payer: Self-pay | Admitting: Podiatry

## 2020-08-10 ENCOUNTER — Ambulatory Visit: Payer: Medicare Other | Admitting: Orthotics

## 2020-08-10 ENCOUNTER — Other Ambulatory Visit: Payer: Self-pay

## 2020-08-10 DIAGNOSIS — M2012 Hallux valgus (acquired), left foot: Secondary | ICD-10-CM

## 2020-08-10 DIAGNOSIS — E1149 Type 2 diabetes mellitus with other diabetic neurological complication: Secondary | ICD-10-CM

## 2020-08-10 DIAGNOSIS — M21612 Bunion of left foot: Secondary | ICD-10-CM

## 2020-08-10 DIAGNOSIS — L97522 Non-pressure chronic ulcer of other part of left foot with fat layer exposed: Secondary | ICD-10-CM | POA: Diagnosis not present

## 2020-08-10 DIAGNOSIS — E08621 Diabetes mellitus due to underlying condition with foot ulcer: Secondary | ICD-10-CM

## 2020-08-10 NOTE — Telephone Encounter (Signed)
[facility] called stating they needed the plan of care from 08/03/2020. I called her back and I am having her e-mail it to me, so I can give it to you. Please advise.

## 2020-08-10 NOTE — Progress Notes (Signed)
Patient picked up re-ordered shoes.

## 2020-08-14 NOTE — Telephone Encounter (Signed)
I had signed this I believe for him and it is in the bin to be faxed. Thanks!

## 2020-08-15 IMAGING — DX DG FOOT 2V*R*
2 series · 2 of 2 positions shown · non-contrast
Comparison: MRI 03/27/2020, radiograph 03/26/2020

CLINICAL DATA: Right foot wound infection

EXAM:
RIGHT FOOT - 2 VIEW

[foot]
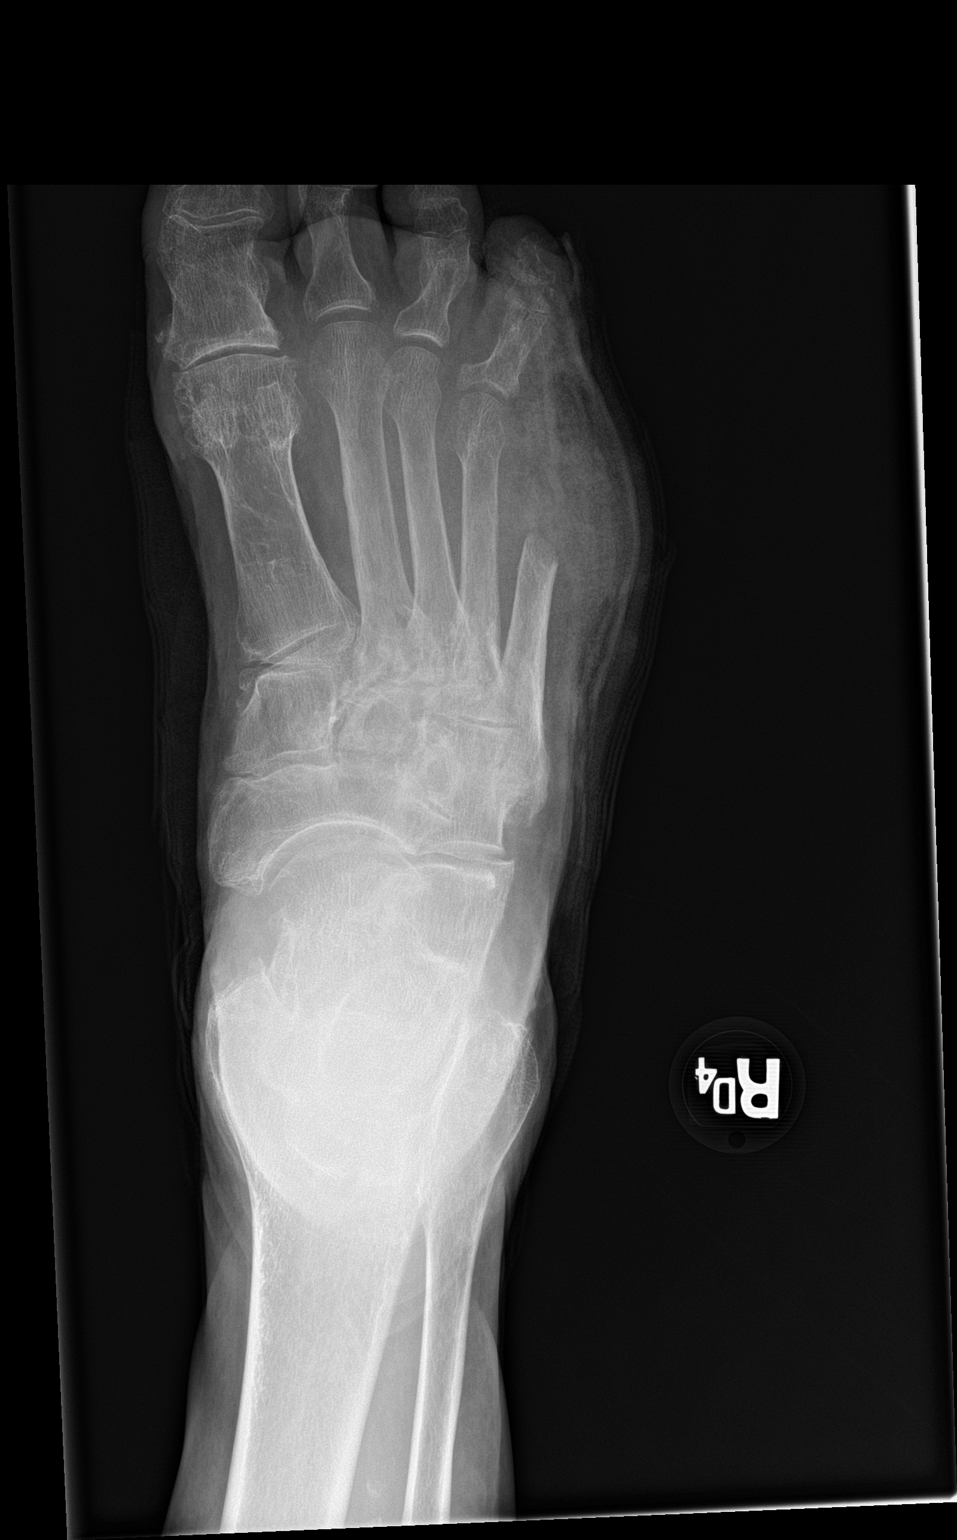

[leg]
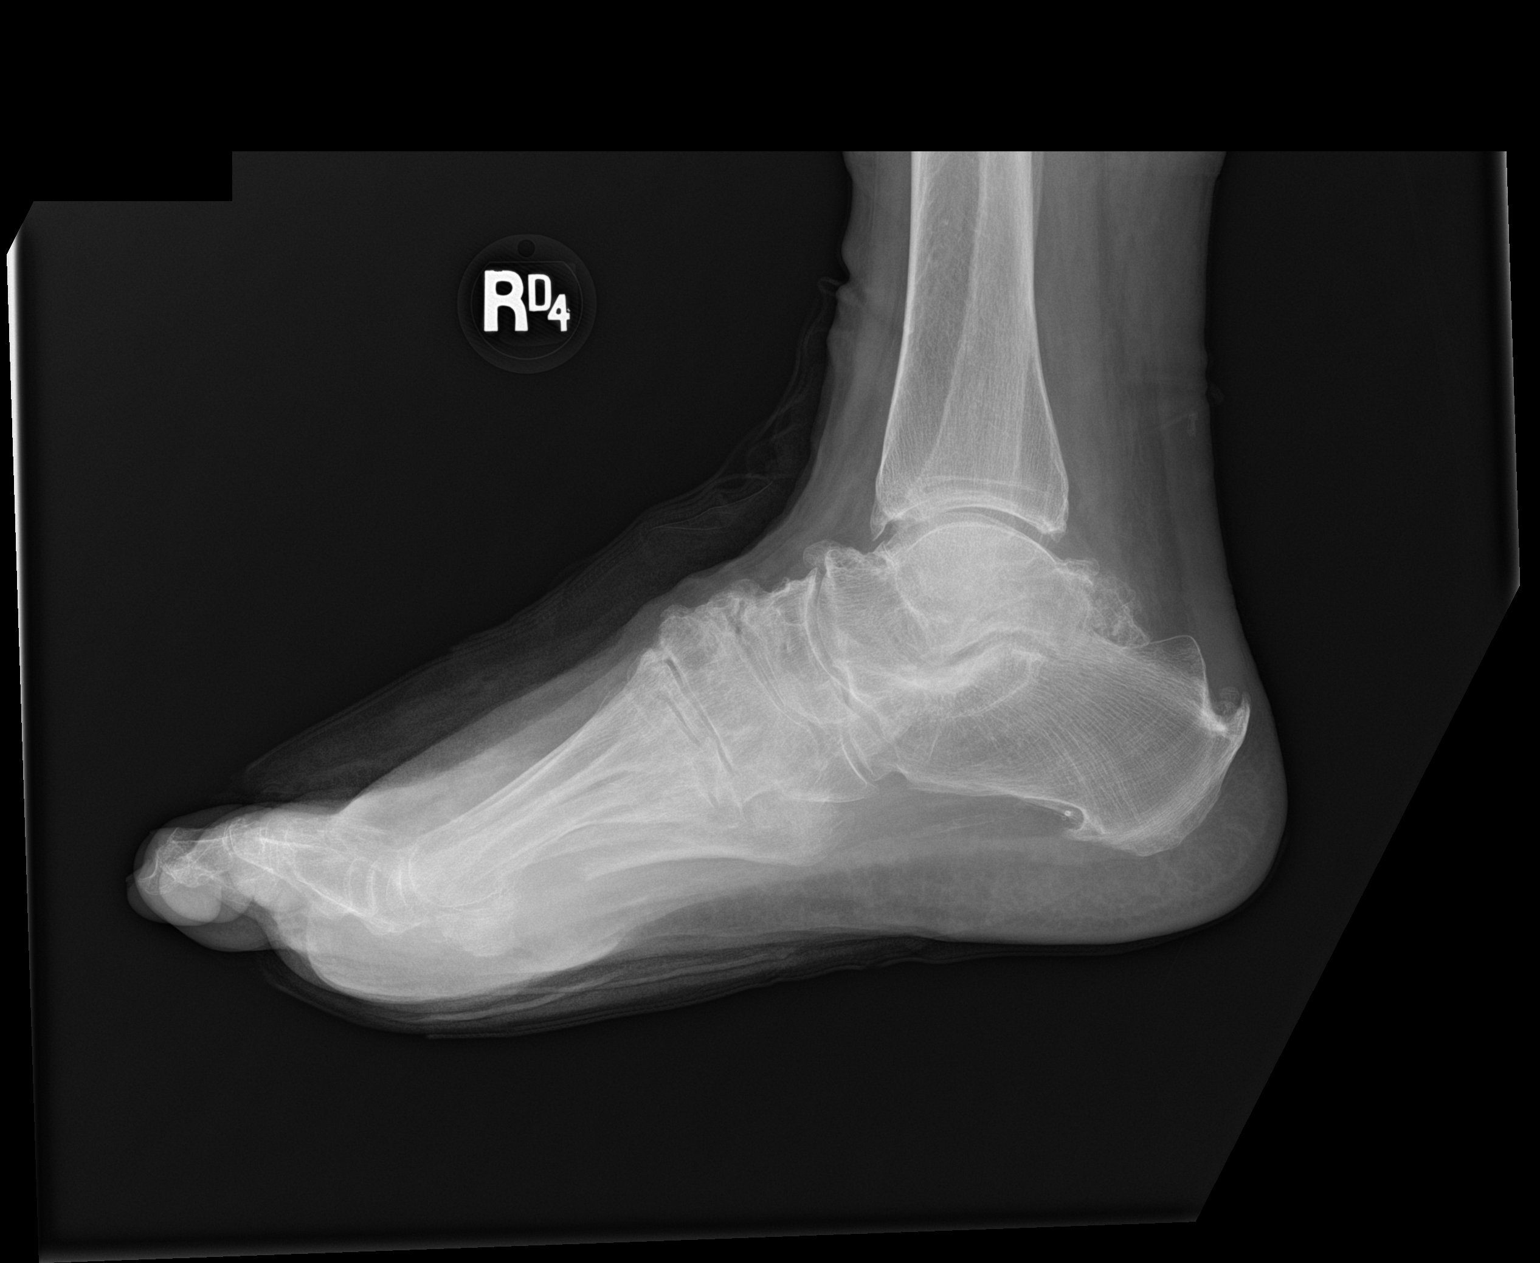

[2 of 2 positions shown; findings below may reference images not displayed]

FINDINGS: Interval amputation of the fifth digit at the level of the mid
metatarsal. Cut edges are smooth. Degenerative changes at the first
MTP joint. Small plantar calcaneal spur.
IMPRESSION: Interval amputation of the fifth digit at the level of the mid
metatarsal. No acute osseous abnormality

## 2020-08-15 IMAGING — MR MR FOOT*R* W/O CM
6 series · 40 of 40 positions shown · non-contrast
Comparison: None.

CLINICAL DATA: Open wound of the fifth toe, question of
osteomyelitis

EXAM:
MRI OF THE RIGHT FOREFOOT WITHOUT CONTRAST
TECHNIQUE: Multiplanar, multisequence MR imaging of the right was performed. No
intravenous contrast was administered.

[Series 6: T1 · axial · right · 3.0mm · 0.47mm/px · z∈[-138,-24]mm · 7 of 36 slices shown (1 of 2)]
[im 1/36]
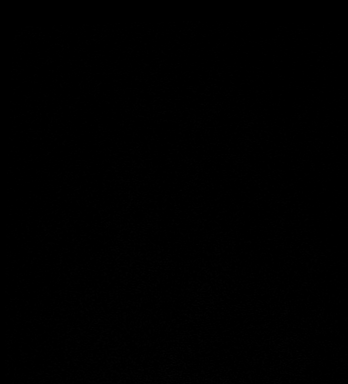
[im 6/36]
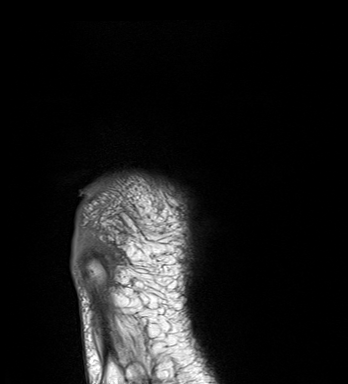
[im 12/36]
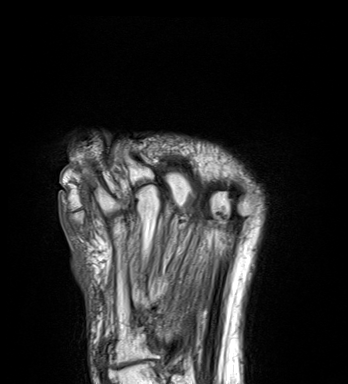
[im 18/36]
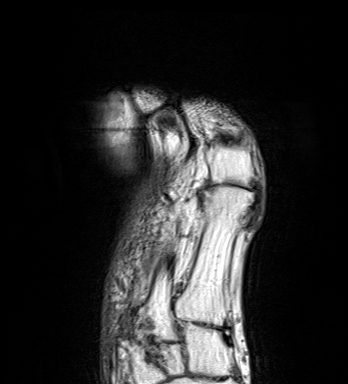
[im 24/36]
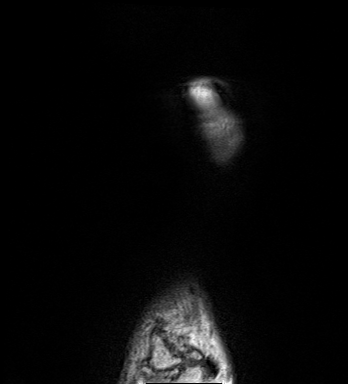
[im 30/36]
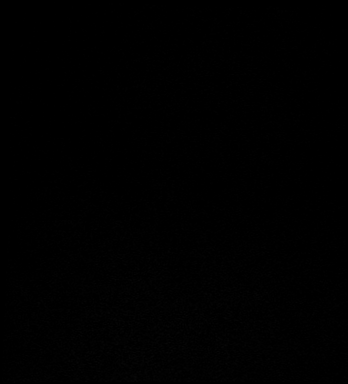
[im 36/36]
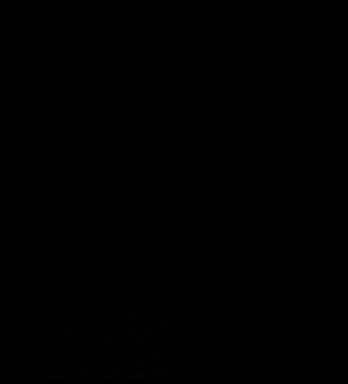

[Series 8: T1 · coronal · right · 3.0mm · 0.52mm/px · 7 of 48 slices shown (2 of 2)]
[im 1/48]
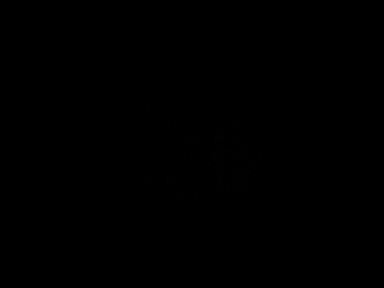
[im 8/48]
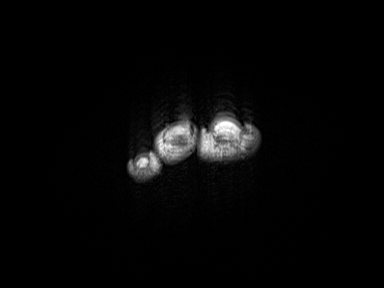
[im 16/48]
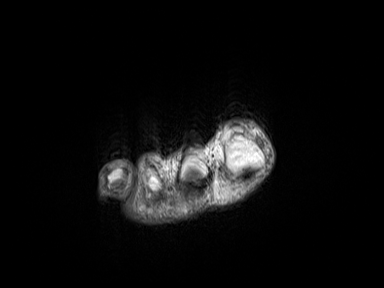
[im 24/48]
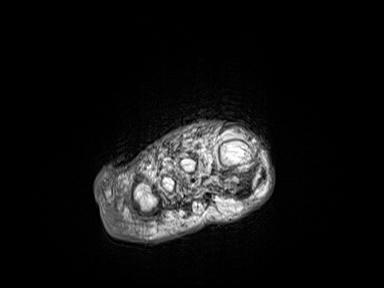
[im 32/48]
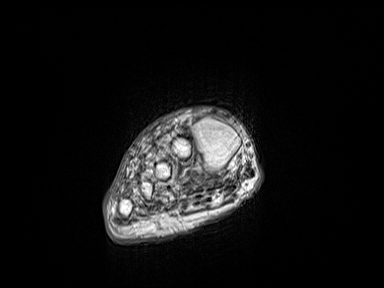
[im 40/48]
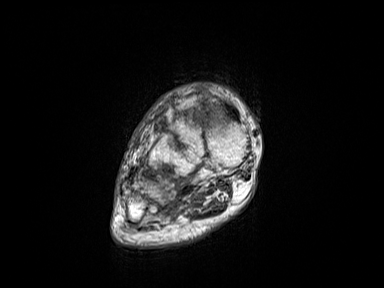
[im 48/48]
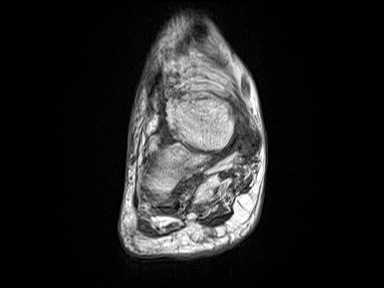

[Series 9: T2 fat-sat · coronal · right · 3.0mm · 0.36mm/px · 7 of 48 slices shown (1 of 3)]
[im 1/48]
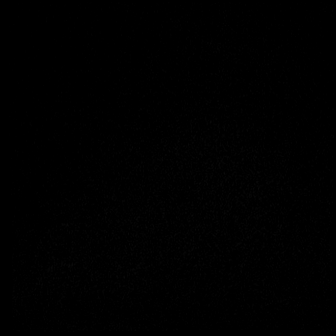
[im 8/48]
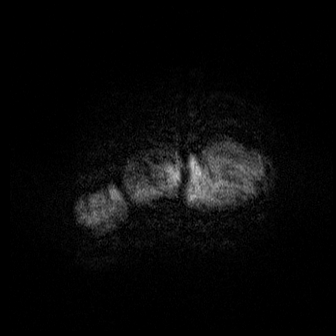
[im 16/48]
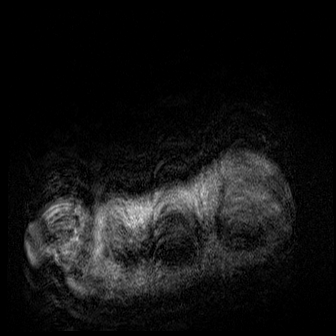
[im 24/48]
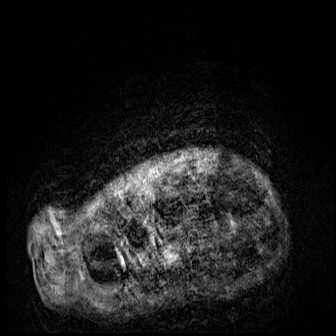
[im 32/48]
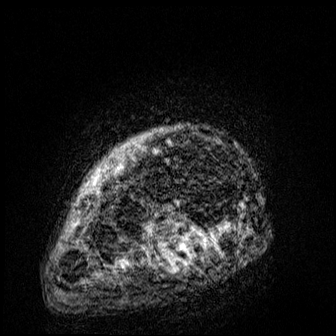
[im 40/48]
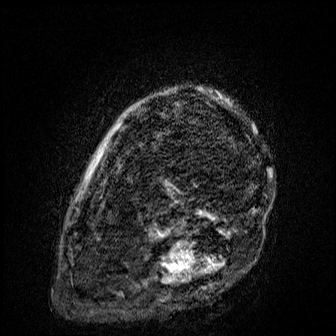
[im 48/48]
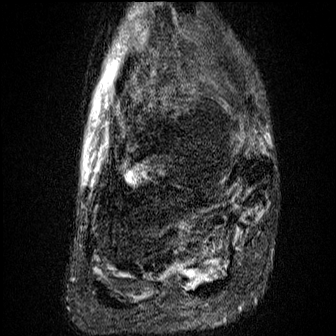

[Series 10: T2 fat-sat · axial · right · 3.0mm · 0.49mm/px · z∈[-137,-23]mm · 6 of 36 slices shown (2 of 3)]
[im 1/36]
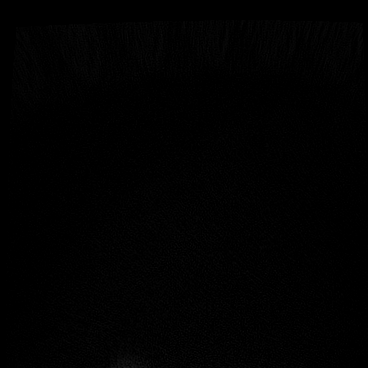
[im 8/36]
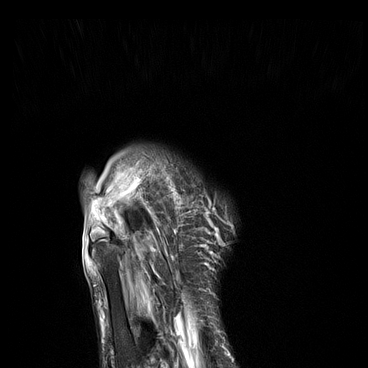
[im 15/36]
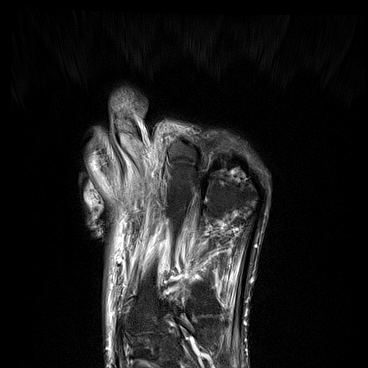
[im 22/36]
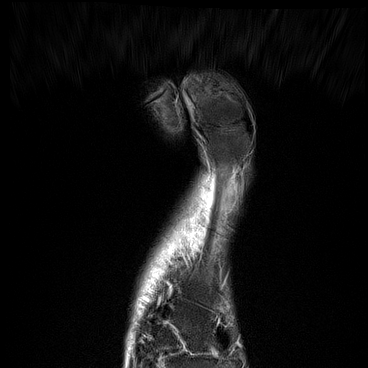
[im 29/36]
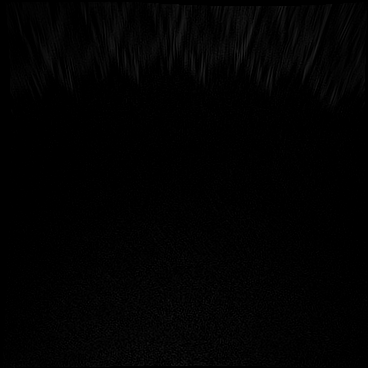
[im 36/36]
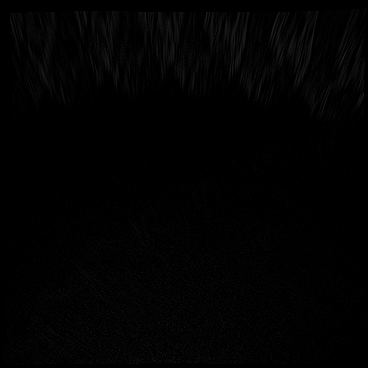

[Series 11: STIR · sagittal · right · 3.0mm · 0.70mm/px · 6 of 41 slices shown]
[im 1/41]
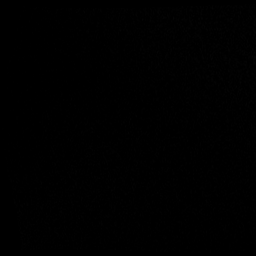
[im 9/41]
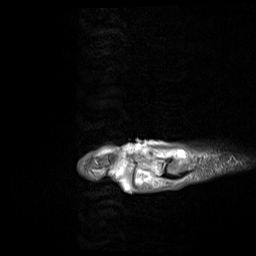
[im 17/41]
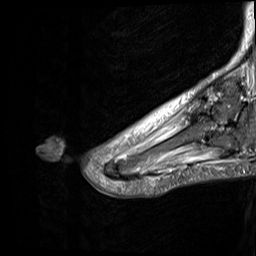
[im 25/41]
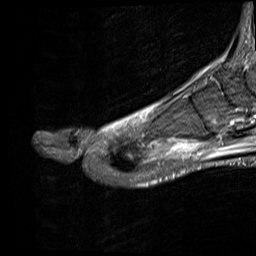
[im 33/41]
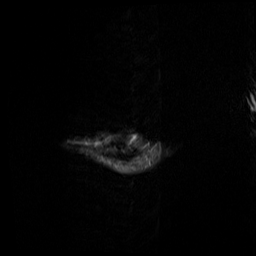
[im 41/41]
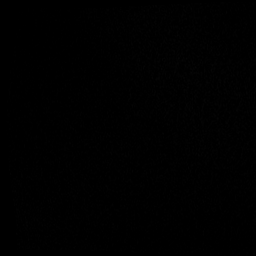

[Series 12: T2 fat-sat · coronal · right · 3.0mm · 0.36mm/px · 7 of 48 slices shown (3 of 3)]
[im 1/48]
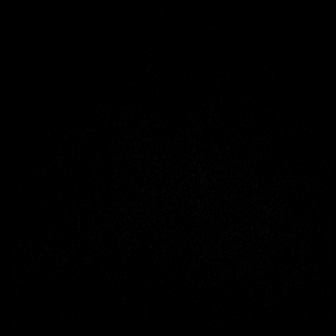
[im 8/48]
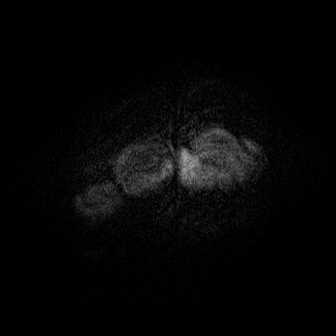
[im 16/48]
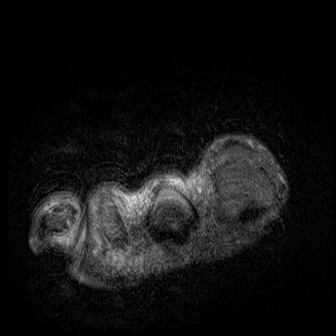
[im 24/48]
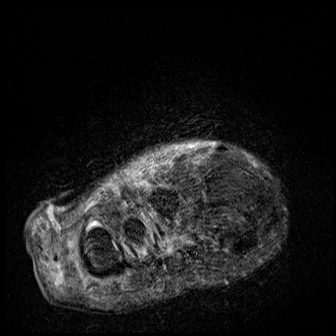
[im 32/48]
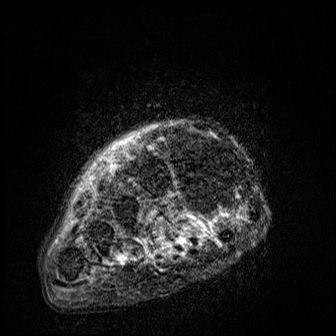
[im 40/48]
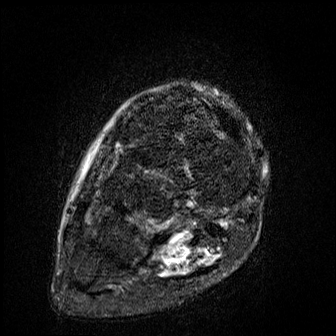
[im 48/48]
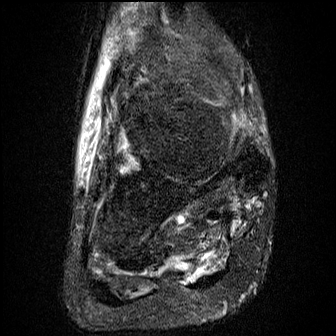

[40 of 40 positions shown; findings below may reference images not displayed]

FINDINGS: Bones/Joint/Cartilage

There is diffusely increased T2 signal with subtle T1 hypointensity
on the plantar surface of the fifth proximal phalanx within
overlying a area of ulceration. There is also increased T2
hyperintense signal without T1 hypointensity within the fifth
metatarsal head, distal fifth phalanx, and fourth phalanges. Mild
midfoot osteoarthritis seen with joint space loss and subchondral
cystic changes. First MTP joint osteoarthritis is seen.

Ligaments

The Lisfranc ligaments appear to be intact.

Muscles and Tendons

Diffuse fatty atrophy seen within the muscles surrounding the
forefoot. The flexor and extensor tendons appear to be intact
increased heterogeneous signal seen within the abductor digiti
minimi tendon.

Soft tissues

There is a focal area of ulceration overlying the dorsal surface of
the fifth metatarsal head with a loculated fluid collection
measuring 1.4 x 0.7 x 1.5 cm. There is diffusely increased dorsal
subcutaneous edema noted.
IMPRESSION: 1. Area of ulceration on the dorsal surface of the fifth metatarsal
head with a loculated fluid collection measuring 1.4 x 0.7 x 1.5 cm,
consistent with subcutaneous abscess.
2. Findings suggestive of early osteomyelitis involving the fifth
proximal phalanx.
3. Probable reactive marrow seen throughout the fifth metatarsal
head, distal fifth phalanx and fourth phalanges.

## 2020-08-15 NOTE — Progress Notes (Signed)
Subjective: 59 year old male presents the office today for follow-up evaluation of ulceration of the left foot. He feels that the wound is doing much better. He denies any drainage or pus or any swelling or redness. He has been wearing a surgical shoe. Denies any systemic complaints such as fevers, chills, nausea, vomiting. No acute changes since last appointment, and no other complaints at this time.   Objective: AAO x3, NAD DP/PT pulses palpable bilaterally, CRT less than 3 seconds Sensation decreased with Semmes Weinstein monofilament  On the dorsal aspect of the left first MPJ there appears to be a scab along the area the ulceration. Upon debridement measures 0.3 x 0.3 x 0.2 cm. There is no probing to bone today there is no undermining or tunneling. Mild rim of erythema likely more from inflammation as opposed to infection. Is no drainage or pus or ascending cellulitis. There is no fluctuation or crepitation. There is no malodor..   Wound on the right foot is healed. No pain with calf compression, swelling, warmth, erythema  Assessment: 59 year old male with left foot ulceration  Plan: -All treatment options discussed with the patient including all alternatives, risks, complications.  -Today I sharply debrided the ulceration utilizing a #312 blade scalpel without any complications none healthy, viable granulation tissue to remove nonviable devitalized tissue to promote wound healing. He tolerated the procedure well. Continue daily dressing changes and offloading.  Trula Slade DPM

## 2020-08-22 ENCOUNTER — Ambulatory Visit (INDEPENDENT_AMBULATORY_CARE_PROVIDER_SITE_OTHER): Payer: Medicare Other | Admitting: Podiatry

## 2020-08-22 ENCOUNTER — Other Ambulatory Visit: Payer: Self-pay

## 2020-08-22 DIAGNOSIS — L97522 Non-pressure chronic ulcer of other part of left foot with fat layer exposed: Secondary | ICD-10-CM

## 2020-08-22 DIAGNOSIS — E08621 Diabetes mellitus due to underlying condition with foot ulcer: Secondary | ICD-10-CM

## 2020-08-22 DIAGNOSIS — E1149 Type 2 diabetes mellitus with other diabetic neurological complication: Secondary | ICD-10-CM

## 2020-08-28 NOTE — Progress Notes (Signed)
Subjective: 59 year old male presents the office today for follow-up evaluation of ulceration of the left foot.  He thinks the wound is doing well.  He has no concerns today.  Denies any increase in swelling or any redness or drainage.  Denies any fevers or chills.  No nausea or vomiting.  Objective: AAO x3, NAD DP/PT pulses palpable bilaterally, CRT less than 3 seconds Sensation decreased with Semmes Weinstein monofilament  On the dorsal aspect the left first MPJ there is a scab present on appears of the wound is healed however upon debridement there is still a wound measuring 0.2 x 0.2 x 0.2 cm there is no probing to bone, undermining or tunneling.  There is no surrounding erythema, ascending cellulitis peer there is no fluctuation crepitation.  There is no malodor. Wound on the right foot is healed. No pain with calf compression, swelling, warmth, erythema  Assessment: 59 year old male with left foot ulceration  Plan: -All treatment options discussed with the patient including all alternatives, risks, complications.  -Sharply debrided the ulceration utilizing a #312 blade scalpel without any complications none healthy, viable granulation tissue to remove nonviable devitalized tissue to promote wound healing. He tolerated the procedure well. Continue daily dressing changes and offloading. -He will continue with Iodosorb dressing changes daily.  Trula Slade DPM

## 2020-09-05 ENCOUNTER — Other Ambulatory Visit: Payer: Self-pay

## 2020-09-05 ENCOUNTER — Ambulatory Visit (INDEPENDENT_AMBULATORY_CARE_PROVIDER_SITE_OTHER): Payer: Medicare Other | Admitting: Podiatry

## 2020-09-05 DIAGNOSIS — E1149 Type 2 diabetes mellitus with other diabetic neurological complication: Secondary | ICD-10-CM | POA: Diagnosis not present

## 2020-09-05 DIAGNOSIS — E08621 Diabetes mellitus due to underlying condition with foot ulcer: Secondary | ICD-10-CM

## 2020-09-05 DIAGNOSIS — L97522 Non-pressure chronic ulcer of other part of left foot with fat layer exposed: Secondary | ICD-10-CM

## 2020-09-05 NOTE — Progress Notes (Signed)
Subjective: 59 year old male presents the office today for follow-up evaluation of ulceration of the left foot. He does keep a bandage on when he is outside of his home he leaves it open. He has been applying Iodosorb. Denies any drainage or pus any swelling or redness. No other concerns today. Denies any fevers or chills.  No nausea or vomiting.  Objective: AAO x3, NAD DP/PT pulses palpable bilaterally, CRT less than 3 seconds Sensation decreased with Semmes Weinstein monofilament  On the dorsal aspect the left first MPJ there is a hyperkeratotic lesion. Upon debridement appears of the wound is healed and there is no underlying ulceration drainage or signs of infection but is preulcerative. There is no surrounding erythema, ascending cellulitis. There is no fluctuation or crepitation. There is no malodor. Wound on the right foot is healed. No pain with calf compression, swelling, warmth, erythema  Assessment: 59 year old male with left foot ulceration-improving  Plan: -All treatment options discussed with the patient including all alternatives, risks, complications.  -Plan she will be debrided the hyperkeratotic tissue on the left foot through the open wound is healed but still preulcerative. I continue daily dressing changes and offloading. Is wearing a regular shoe as well. Discussed daily foot inspection. Glucose control.  Trula Slade DPM

## 2020-09-19 ENCOUNTER — Ambulatory Visit: Payer: Medicare Other | Admitting: Podiatry

## 2020-09-26 ENCOUNTER — Ambulatory Visit (INDEPENDENT_AMBULATORY_CARE_PROVIDER_SITE_OTHER): Payer: Medicare Other | Admitting: Podiatry

## 2020-09-26 DIAGNOSIS — M21612 Bunion of left foot: Secondary | ICD-10-CM

## 2020-09-26 DIAGNOSIS — E1149 Type 2 diabetes mellitus with other diabetic neurological complication: Secondary | ICD-10-CM | POA: Diagnosis not present

## 2020-09-26 DIAGNOSIS — E08621 Diabetes mellitus due to underlying condition with foot ulcer: Secondary | ICD-10-CM

## 2020-09-26 DIAGNOSIS — M2012 Hallux valgus (acquired), left foot: Secondary | ICD-10-CM | POA: Diagnosis not present

## 2020-09-26 DIAGNOSIS — L97522 Non-pressure chronic ulcer of other part of left foot with fat layer exposed: Secondary | ICD-10-CM | POA: Diagnosis not present

## 2020-10-01 NOTE — Progress Notes (Signed)
Subjective: 59 year old male presents the office today for follow-up evaluation of ulceration of the left foot. He has not put any bandage on the wound if he feels the wound is healed. Denies any open lesions, swelling, redness or any pain.No other concerns today. Denies any fevers or chills.  No nausea or vomiting.  Objective: AAO x3, NAD DP/PT pulses palpable bilaterally, CRT less than 3 seconds Sensation decreased with Semmes Weinstein monofilament  On the dorsal aspect the left first MPJ there is no significant hyperkeratotic tissue today. The wound appears to be healed and there is no edema, erythema.  There is no drainage or ascending cellulitis.  There is no pain. Hallux rigidus present with bone spurring present. Wound on the right foot is healed. No pain with calf compression, swelling, warmth, erythema  Assessment: 59 year old male with left foot ulceration-healed  Plan: -All treatment options discussed with the patient including all alternatives, risks, complications.  -There is no significant tissue debrided today.  The wound appears to be healed.  Continue offloading.  He is wearing regular shoes and discussed the importance of daily foot inspection.  Trula Slade DPM

## 2020-10-03 ENCOUNTER — Ambulatory Visit: Payer: Medicare Other | Admitting: Podiatry

## 2020-10-17 ENCOUNTER — Ambulatory Visit: Payer: Medicare Other | Admitting: Podiatry

## 2020-10-31 ENCOUNTER — Ambulatory Visit: Payer: Medicare Other | Admitting: Podiatry

## 2020-11-02 ENCOUNTER — Telehealth: Payer: Self-pay | Admitting: Podiatry

## 2020-11-02 NOTE — Telephone Encounter (Signed)
I cannot prescribe pain medications for an issues I have not treated. If needed, I can get it for him tomorrow. I also don't see any x-rays so can you see what urgent care he went to before he comes in

## 2020-11-02 NOTE — Telephone Encounter (Signed)
Patient called on-call requesting pain rx - I did inform him of Dr. Leigh Aurora determination that we cannot give him pain medication until we evaluate his issue. Recc ice, elevation, tylenol. Patient to follow-up tomorrow with Dr. Jacqualyn Posey.  Patient states he was seen at urgent care in [facility] - the only one I could find is [facility]

## 2020-11-02 NOTE — Telephone Encounter (Signed)
Pt fractured his foot and wanted medication prescribed before his appointment tomorrow. Urgent care did not prescribe anything for pain

## 2020-11-03 ENCOUNTER — Ambulatory Visit (INDEPENDENT_AMBULATORY_CARE_PROVIDER_SITE_OTHER): Payer: Medicare Other | Admitting: Podiatry

## 2020-11-03 ENCOUNTER — Other Ambulatory Visit: Payer: Self-pay

## 2020-11-03 DIAGNOSIS — S92301A Fracture of unspecified metatarsal bone(s), right foot, initial encounter for closed fracture: Secondary | ICD-10-CM

## 2020-11-03 DIAGNOSIS — E1149 Type 2 diabetes mellitus with other diabetic neurological complication: Secondary | ICD-10-CM

## 2020-11-03 DIAGNOSIS — S82201A Unspecified fracture of shaft of right tibia, initial encounter for closed fracture: Secondary | ICD-10-CM | POA: Diagnosis not present

## 2020-11-03 DIAGNOSIS — S82401A Unspecified fracture of shaft of right fibula, initial encounter for closed fracture: Secondary | ICD-10-CM

## 2020-11-03 MED ORDER — HYDROCODONE-ACETAMINOPHEN 5-325 MG PO TABS: 1.0000 | ORAL_TABLET | Freq: Four times a day (QID) | ORAL | 0 refills | Status: DC | PRN

## 2020-11-05 NOTE — Progress Notes (Signed)
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  He was seen in urgent care yesterday and he presents today for further evaluation.  Denies any other injury at the time of the accident. Denies any systemic complaints such as fevers, chills, nausea, vomiting. No acute changes since last appointment, and no other complaints at this time.   Objective: AAO x3, NAD DP/PT pulses palpable bilaterally, CRT less than 3 seconds Splint clean, dry, intact.  Upon removal there is no skin breakdown or ulcerations identified but there is moderate edema present to the ankle.  Tenderness palpation of the ankle.  There is mild discomfort of the foot as well. No pain with calf compression, swelling, warmth, erythema  Assessment: Tib-fib fracture right with subacute fractures right foot  Plan: Patient brought in a CD of the x-rays from urgent care.  Fracture of the distal tibia as well as fibula with mild displacement.  Also appears to be subacute fractures present of the foot on the metatarsals.  Splint was reapplied make sure to pad all bony prominences.  Continue nonweightbearing for now.  Prescribed Vicodin for pain as nothing was given at the urgent care.  Referral to orthopedics.  I called [place] and he has an appointment on Tuesday with Dr. Sharol Given.   Trula Slade DPM

## 2020-11-07 ENCOUNTER — Ambulatory Visit (INDEPENDENT_AMBULATORY_CARE_PROVIDER_SITE_OTHER): Payer: Medicare Other

## 2020-11-07 ENCOUNTER — Ambulatory Visit (INDEPENDENT_AMBULATORY_CARE_PROVIDER_SITE_OTHER): Payer: Medicare Other | Admitting: Orthopedic Surgery

## 2020-11-07 DIAGNOSIS — M79604 Pain in right leg: Secondary | ICD-10-CM | POA: Diagnosis not present

## 2020-11-07 DIAGNOSIS — S82871A Displaced pilon fracture of right tibia, initial encounter for closed fracture: Secondary | ICD-10-CM | POA: Diagnosis not present

## 2020-11-07 DIAGNOSIS — I739 Peripheral vascular disease, unspecified: Secondary | ICD-10-CM | POA: Diagnosis not present

## 2020-11-07 DIAGNOSIS — M79601 Pain in right arm: Secondary | ICD-10-CM

## 2020-11-08 ENCOUNTER — Telehealth: Payer: Self-pay | Admitting: Orthopedic Surgery

## 2020-11-08 ENCOUNTER — Encounter: Payer: Self-pay | Admitting: Orthopedic Surgery

## 2020-11-08 ENCOUNTER — Other Ambulatory Visit: Payer: Self-pay | Admitting: Orthopedic Surgery

## 2020-11-08 MED ORDER — HYDROCODONE-ACETAMINOPHEN 5-325 MG PO TABS
1.0000 | ORAL_TABLET | ORAL | 0 refills | Status: DC | PRN
Start: 2020-11-08 — End: 2020-11-22

## 2020-11-08 NOTE — Telephone Encounter (Signed)
Rx sent 

## 2020-11-08 NOTE — Telephone Encounter (Signed)
Pt eval in office yesterday. Requesting refill on hydrocodone 5/325 received #15 11/03/20

## 2020-11-08 NOTE — Telephone Encounter (Signed)
Patient called requesting a refill of hydrocodone. Please send pharmacy on file. Patient phone number is [phone].

## 2020-11-08 NOTE — Progress Notes (Signed)
Office Visit Note   Patient: Brian [name]           Date of Birth: 1960-10-14           MRN: [id number] Visit Date: 11/07/2020              Requested by: Trula Slade, DPM [address] [place],  [place] [id number] PCP: [name], MD  Chief Complaint  Patient presents with  . Right Leg - Injury      HPI: Patient is a 60 year old gentleman who states that on January 27 he missed a step sustaining a pilon fracture of the right ankle and injury to the right shoulder.  Patient was initially seen by Dr. Earleen Newport in podiatry and was referred for evaluation and treatment.  Patient states he previously has broken his shoulder he states he cannot actively elevate his arm without assistance.  Patient is a type I diabetic, end-stage renal disease on dialysis, peripheral vascular disease, and is a smoker.  Assessment & Plan: Visit Diagnoses:  1. Pain in right leg   2. Right arm pain   3. Displaced pilon fracture of right tibia, initial encounter for closed fracture     Plan: Discussed with the patient increased risks of open reduction internal fixation including infection nonhealing of the skin potential for below the knee amputation, due to diabetes end-stage renal disease, dialysis, peripheral vascular disease, smoking.  Patient states he understands would like to proceed with conservative treatment we will place him in a fracture boot he will be nonweightbearing repeat three-view radiographs of the right ankle at follow-up.  Follow-Up Instructions: Return in about 3 weeks (around 11/28/2020).   Ortho Exam  Patient is alert, oriented, no adenopathy, well-dressed, normal affect, normal respiratory effort. Examination the right shoulder patient cannot move his arm up to 45 degrees but after assistance from 45 degrees he can get his arm up to 90 degrees actively.  He has pain with Neer and Hawkins impingement test.  The [facility] joint is nontender to palpation.   Examination of the right foot his foot is plantigrade there is dermatitis anteriorly over the ankle that seems consistent with venous insufficiency.  Patient does not have palpable pulses with the Doppler patient does have biphasic dorsalis pedis and posterior tibial pulse.  His radiographs do show calcification of the arteries to the ankle.  Imaging: XR Humerus Right  Result Date: 11/08/2020 2 view radiographs of the right shoulder shows a congruent AC joint previous humeral fracture with a congruent glenohumeral joint.  XR Tibia/Fibula Right  Result Date: 11/08/2020 2 view radiographs of the right tibia and fibula shows an impacted varus alignment pilon fracture of the tibia with a transverse Weber C fibular fracture.  Mortise is congruent.  No images are attached to the encounter.  Labs: Lab Results  Component Value Date   HGBA1C 6.5 (H) 03/26/2020   HGBA1C 6.0 (H) 01/22/2018   HGBA1C 5.8 (H) 02/05/2017   ESRSEDRATE 40 (H) 03/26/2020   ESRSEDRATE 18 (H) 09/12/2010   CRP 3.0 (H) 04/02/2020   CRP 4.2 (H) 04/01/2020   CRP 6.5 (H) 03/31/2020   REPTSTATUS 04/01/2020 FINAL 03/27/2020   GRAMSTAIN  03/27/2020    RARE WBC PRESENT,BOTH PMN AND MONONUCLEAR FEW GRAM NEGATIVE COCCOBACILLI    CULT  03/27/2020    FEW GEMELLA MORBILLORUM Standardized susceptibility testing for this organism is not available. NO ANAEROBES ISOLATED Performed at [facility], [address] [place],  [place] [address]      Lab Results  Component Value Date   ALBUMIN 3.0 (L) 03/26/2020   ALBUMIN 3.2 (L) 01/24/2018   ALBUMIN 3.1 (L) 01/23/2018   PREALBUMIN 6.8 (L) 03/26/2020    Lab Results  Component Value Date   MG 2.0 04/02/2020   MG 1.9 04/01/2020   No results found for: [facility]  Lab Results  Component Value Date   PREALBUMIN 6.8 (L) 03/26/2020   CBC EXTENDED Latest Ref Rng & Units 04/02/2020 04/01/2020 03/31/2020  WBC 4.0 - 10.5 K/uL 12.6(H) 13.1(H) 14.1(H)  RBC 4.22 - 5.81 MIL/uL 3.40(L)  3.58(L) 3.49(L)  HGB 13.0 - 17.0 g/dL 11.3(L) 12.1(L) 11.8(L)  HCT 39.0 - 52.0 % 35.1(L) 37.1(L) 36.1(L)  PLT 150 - 400 K/uL 324 327 370  NEUTROABS 1.7 - 7.7 K/uL 5.3 7.4 8.8(H)  LYMPHSABS 0.7 - 4.0 K/uL 4.5(H) 3.4 3.1     There is no height or weight on file to calculate BMI.  Orders:  Orders Placed This Encounter  Procedures  . XR Tibia/Fibula Right  . XR Humerus Right   No orders of the defined types were placed in this encounter.    Procedures: No procedures performed  Clinical Data: No additional findings.  ROS:  All other systems negative, except as noted in the HPI. Review of Systems  Objective: Vital Signs: There were no vitals taken for this visit.  Specialty Comments:  No specialty comments available.  PMFS History: Patient Active Problem List   Diagnosis Date Noted  . Personal history of diabetic foot ulcer 04/11/2020  . Diabetic infection of right foot ([place]) 03/26/2020  . Headache, unspecified 09/10/2019  . Diarrhea, unspecified 08/16/2019  . Encounter for removal of sutures 07/28/2018  . 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 Encounter for immunization 07/01/2018  . Gastritis 01/23/2018  . Acute blood loss anemia 01/23/2018  . Duodenal ulcer   . Upper GI bleed 01/22/2018  . History of CVA (cerebrovascular accident) 01/15/2018  . Iron deficiency anemia, unspecified 11/25/2017  . Coagulation defect, unspecified ([place]) 11/18/2017  . Unspecified protein-calorie malnutrition ([place]) 11/11/2017  . Acquired absence of spleen 10/30/2017  . Nicotine dependence, unspecified, uncomplicated 42/59/5638  . Other disorders of phosphorus metabolism 10/30/2017  . Unspecified nephritic syndrome with unspecified morphologic changes 10/30/2017  . Anemia in chronic kidney disease 10/24/2017  . Dependence on renal dialysis ([place]) 10/24/2017  . Encounter for screening for respiratory tuberculosis 10/24/2017  . Pruritus, unspecified 10/24/2017  . Secondary  hyperparathyroidism of renal origin ([place]) 10/24/2017  . Shortness of breath 10/24/2017  . Type 1 diabetes mellitus with kidney complication ([place]) - on insulin 05/01/2017  . Hemorrhagic stroke [facility])- Jan 2010 05/01/2017  . H/O hyperlipidemia- sev yrs ago 05/01/2017  . h/o Hypertriglyceridemia- 13000's in past 05/01/2017  . H/O splenectomy 05/01/2017  . Status post cholecystectomy 05/01/2017  . Gout- L foot- mult times 05/01/2017  . Diabetic neuropathy ([place])- 6 mo or so; never started meds 05/01/2017  . Vitamin D deficiency 05/01/2017  . ESRD on dialysis ([place]) 02/05/2017  . Anasarca- may 2018 hosp 02/05/2017  . Leukocytosis- s/p plenectomy 02/05/2017  . Hypertension   . Diabetes mellitus without complication ([place])   . Tobacco abuse- >er than 55 pack yr hx; current smoker    Past Medical History:  Diagnosis Date  . Arthritis   . Bleeding ulcer   . Cirrhosis ([place])   . Complication of anesthesia    " I had a nose bleed a day or two after surgery-it  could have been the oxygen"  . Diabetes mellitus without complication ([place])   . Diabetic neuropathy ([place])   . Elevated TSH   . ESRD on dialysis [facility])    MWF  . History of kidney stones   . Hypertension   . Stroke [facility])    hemorrhagic 2010  . Tobacco abuse     Family History  Problem Relation Age of Onset  . Lymphoma Mother   . Hypertension Father   . Diabetes Mellitus II Father   . Hypertension Brother   . Diabetes Mellitus I Brother   . Hypertension Brother   . Colon cancer Neg Hx   . Stomach cancer Neg Hx   . Colon polyps Neg Hx   . Esophageal cancer Neg Hx   . Rectal cancer Neg Hx     Past Surgical History:  Procedure Laterality Date  . A/V FISTULAGRAM N/A 02/05/2018   Procedure: A/V FISTULAGRAM;  Surgeon: Conrad Riceville, MD;  Location: [place] [facility];  Service: Cardiovascular;  Laterality: N/A;  . AMPUTATION Right 03/27/2020   Procedure: AMPUTATION RAY 5th;  Surgeon: Trula Slade, DPM;  Location: [place];  Service:  Podiatry;  Laterality: Right;  . AV FISTULA PLACEMENT Left 05/16/2017   Procedure: ARTERIOVENOUS (AV) FISTULA CREATION-LEFT;  Surgeon: Angelia Mould, MD;  Location: [place];  Service: Vascular;  Laterality: Left;  . CHOLECYSTECTOMY    . COLONOSCOPY    . ESOPHAGOGASTRODUODENOSCOPY N/A 01/23/2018   Procedure: ESOPHAGOGASTRODUODENOSCOPY (EGD);  Surgeon: Yetta Flock, MD;  Location[facility] ENDOSCOPY;  Service: Gastroenterology;  Laterality: N/A;  . PANCREAS SURGERY    . PERIPHERAL VASCULAR BALLOON ANGIOPLASTY  02/05/2018   Procedure: PERIPHERAL VASCULAR BALLOON ANGIOPLASTY;  Surgeon: Conrad Blacksburg, MD;  Location: [place] [facility];  Service: Cardiovascular;;  left AV fistula  . SPLENECTOMY    . WOUND DEBRIDEMENT Right 03/30/2020   Procedure: DEBRIDEMENT WOUND RIGHT FOOT;  Surgeon: Trula Slade, DPM;  Location: [place];  Service: Podiatry;  Laterality: Right;   Social History   Occupational History  . Occupation: disability  Tobacco Use  . Smoking status: Current Every Day Smoker    Packs/day: 0.50    Years: 40.00    Pack years: 20.00    Types: Cigarettes  . Smokeless tobacco: Never Used  Vaping Use  . Vaping Use: Never used  Substance and Sexual Activity  . Alcohol use: No    Comment: quit around 2010; no h/o heavy use  . Drug use: No  . Sexual activity: Not on file

## 2020-11-09 ENCOUNTER — Telehealth: Payer: Self-pay

## 2020-11-09 DIAGNOSIS — K7460 Unspecified cirrhosis of liver: Secondary | ICD-10-CM

## 2020-11-09 NOTE — Telephone Encounter (Signed)
-----   Message from Roetta Sessions, [place] sent at 05/18/2020  8:40 AM EDT ----- Regarding: RUQ U/S due in Feb RUQ U/S due in 11-2020 for [facility] screening. See result note from 05-17-2020

## 2020-11-09 NOTE — Telephone Encounter (Signed)
Called and spoke to patient. He would like a Tuesday or Thursday. Called and scheduled patient for 3-3 at 10:30am to arrive at 10:15am, NPO after midnight.  Sent patient MyChart message and mailed him a letter

## 2020-11-14 ENCOUNTER — Ambulatory Visit: Payer: Medicare Other | Admitting: Podiatry

## 2020-11-22 ENCOUNTER — Telehealth: Payer: Self-pay | Admitting: Orthopedic Surgery

## 2020-11-22 ENCOUNTER — Other Ambulatory Visit: Payer: Self-pay | Admitting: Physician Assistant

## 2020-11-22 MED ORDER — HYDROCODONE-ACETAMINOPHEN 5-325 MG PO TABS
1.0000 | ORAL_TABLET | Freq: Four times a day (QID) | ORAL | 0 refills | Status: DC | PRN
Start: 2020-11-22 — End: 2020-12-19

## 2020-11-22 NOTE — Telephone Encounter (Signed)
Patient called needing Rx refilled Hydrocodone. The number to contact patient is [phone]

## 2020-11-22 NOTE — Telephone Encounter (Signed)
Please see office note from 2 weeks ago and advise. Pt would like refill on pain medication.

## 2020-11-22 NOTE — Telephone Encounter (Signed)
Done

## 2020-11-28 ENCOUNTER — Ambulatory Visit (INDEPENDENT_AMBULATORY_CARE_PROVIDER_SITE_OTHER): Payer: Medicare Other | Admitting: Orthopedic Surgery

## 2020-11-28 ENCOUNTER — Ambulatory Visit: Payer: Medicare Other | Admitting: Podiatry

## 2020-11-28 ENCOUNTER — Ambulatory Visit (INDEPENDENT_AMBULATORY_CARE_PROVIDER_SITE_OTHER): Payer: Medicare Other

## 2020-11-28 DIAGNOSIS — S82874A Nondisplaced pilon fracture of right tibia, initial encounter for closed fracture: Secondary | ICD-10-CM | POA: Diagnosis not present

## 2020-11-28 DIAGNOSIS — M25571 Pain in right ankle and joints of right foot: Secondary | ICD-10-CM | POA: Diagnosis not present

## 2020-12-05 ENCOUNTER — Encounter: Payer: Self-pay | Admitting: Orthopedic Surgery

## 2020-12-05 NOTE — Progress Notes (Signed)
Office Visit Note   Patient: Brian [name]           Date of Birth: 12/28/1960           MRN: [id number] Visit Date: 11/28/2020              Requested by: [name], MD [address] [place],  [place] [address] PCP: [name], MD  Chief Complaint  Patient presents with  . Right Ankle - Follow-up    DOI 11/02/20 conservative treatment Pilon fx       HPI: Patient is a 60 year old gentleman with peripheral vascular disease and a closed pilon fracture of the right ankle patient has been treated with conservative care.  He is currently been nonweightbearing in a wheelchair in a fracture boot using Vicodin for pain.  Assessment & Plan: Visit Diagnoses:  1. Pain in right ankle and joints of right foot   2. Closed nondisplaced pilon fracture of right tibia, initial encounter     Plan: We will continue with compression to help decrease the swelling recommended elevation nonweightbearing follow-up in 3 weeks at which time we will repeat three-view radiographs of the right ankle and begin gait training with therapy.  Follow-Up Instructions: Return in about 3 weeks (around 12/19/2020).   Ortho Exam  Patient is alert, oriented, no adenopathy, well-dressed, normal affect, normal respiratory effort. Examination patient is 3 weeks status post a nondisplaced right ankle pilon fracture the alignment has improved.  Patient does have bruising in the skin with no open skin ulcers or drainage.  Imaging: No results found. No images are attached to the encounter.  Labs: Lab Results  Component Value Date   HGBA1C 6.5 (H) 03/26/2020   HGBA1C 6.0 (H) 01/22/2018   HGBA1C 5.8 (H) 02/05/2017   ESRSEDRATE 40 (H) 03/26/2020   ESRSEDRATE 18 (H) 09/12/2010   CRP 3.0 (H) 04/02/2020   CRP 4.2 (H) 04/01/2020   CRP 6.5 (H) 03/31/2020   REPTSTATUS 04/01/2020 FINAL 03/27/2020   GRAMSTAIN  03/27/2020    RARE WBC PRESENT,BOTH PMN AND MONONUCLEAR FEW GRAM NEGATIVE  COCCOBACILLI    CULT  03/27/2020    FEW GEMELLA MORBILLORUM Standardized susceptibility testing for this organism is not available. NO ANAEROBES ISOLATED Performed at [facility], [place] [address], [place],  [address]      Lab Results  Component Value Date   ALBUMIN 3.0 (L) 03/26/2020   ALBUMIN 3.2 (L) 01/24/2018   ALBUMIN 3.1 (L) 01/23/2018   PREALBUMIN 6.8 (L) 03/26/2020    Lab Results  Component Value Date   MG 2.0 04/02/2020   MG 1.9 04/01/2020   No results found for: [facility]  Lab Results  Component Value Date   PREALBUMIN 6.8 (L) 03/26/2020   CBC EXTENDED Latest Ref Rng & Units 04/02/2020 04/01/2020 03/31/2020  WBC 4.0 - 10.5 K/uL 12.6(H) 13.1(H) 14.1(H)  RBC 4.22 - 5.81 MIL/uL 3.40(L) 3.58(L) 3.49(L)  HGB 13.0 - 17.0 g/dL 11.3(L) 12.1(L) 11.8(L)  HCT 39.0 - 52.0 % 35.1(L) 37.1(L) 36.1(L)  PLT 150 - 400 K/uL 324 327 370  NEUTROABS 1.7 - 7.7 K/uL 5.3 7.4 8.8(H)  LYMPHSABS 0.7 - 4.0 K/uL 4.5(H) 3.4 3.1     There is no height or weight on file to calculate BMI.  Orders:  Orders Placed This Encounter  Procedures  . XR Ankle Complete Right   No orders of the defined types were placed in this encounter.    Procedures: No procedures performed  Clinical Data: No additional findings.  ROS:  All other systems negative, except as noted in the HPI. Review of Systems  Objective: Vital Signs: There were no vitals taken for this visit.  Specialty Comments:  No specialty comments available.  PMFS History: Patient Active Problem List   Diagnosis Date Noted  . Personal history of diabetic foot ulcer 04/11/2020  . Diabetic infection of right foot ([place]) 03/26/2020  . Headache, unspecified 09/10/2019  . Diarrhea, unspecified 08/16/2019  . Encounter for removal of sutures 07/28/2018  . Cryptogenic cirrhosis ([place]) 07/16/2018  . Encounter for immunization 07/01/2018  . Gastritis 01/23/2018  . Acute blood loss anemia 01/23/2018  . Duodenal ulcer    . Upper GI bleed 01/22/2018  . History of CVA (cerebrovascular accident) 01/15/2018  . Iron deficiency anemia, unspecified 11/25/2017  . Coagulation defect, unspecified ([place]) 11/18/2017  . Unspecified protein-calorie malnutrition ([place]) 11/11/2017  . Acquired absence of spleen 10/30/2017  . Nicotine dependence, unspecified, uncomplicated 99/24/2683  . Other disorders of phosphorus metabolism 10/30/2017  . Unspecified nephritic syndrome with unspecified morphologic changes 10/30/2017  . Anemia in chronic kidney disease 10/24/2017  . Dependence on renal dialysis ([place]) 10/24/2017  . Encounter for screening for respiratory tuberculosis 10/24/2017  . Pruritus, unspecified 10/24/2017  . Secondary hyperparathyroidism of renal origin ([place]) 10/24/2017  . Shortness of breath 10/24/2017  . Type 1 diabetes mellitus with kidney complication ([place]) - on insulin 05/01/2017  . Hemorrhagic stroke [facility])- Jan 2010 05/01/2017  . H/O hyperlipidemia- sev yrs ago 05/01/2017  . h/o Hypertriglyceridemia- 13000's in past 05/01/2017  . H/O splenectomy 05/01/2017  . Status post cholecystectomy 05/01/2017  . Gout- L foot- mult times 05/01/2017  . Diabetic neuropathy ([place])- 6 mo or so; never started meds 05/01/2017  . Vitamin D deficiency 05/01/2017  . ESRD on dialysis ([place]) 02/05/2017  . Anasarca- may 2018 hosp 02/05/2017  . Leukocytosis- s/p plenectomy 02/05/2017  . Hypertension   . Diabetes mellitus without complication ([place])   . Tobacco abuse- >er than 55 pack yr hx; current smoker    Past Medical History:  Diagnosis Date  . Arthritis   . Bleeding ulcer   . Cirrhosis ([place])   . Complication of anesthesia    " I had a nose bleed a day or two after surgery-it could have been the oxygen"  . Diabetes mellitus without complication ([place])   . Diabetic neuropathy ([place])   . Elevated TSH   . ESRD on dialysis [facility])    MWF  . History of kidney stones   . Hypertension   . Stroke [facility])    hemorrhagic 2010  .  Tobacco abuse     Family History  Problem Relation Age of Onset  . Lymphoma Mother   . Hypertension Father   . Diabetes Mellitus II Father   . Hypertension Brother   . Diabetes Mellitus I Brother   . Hypertension Brother   . Colon cancer Neg Hx   . Stomach cancer Neg Hx   . Colon polyps Neg Hx   . Esophageal cancer Neg Hx   . Rectal cancer Neg Hx     Past Surgical History:  Procedure Laterality Date  . A/V FISTULAGRAM N/A 02/05/2018   Procedure: A/V FISTULAGRAM;  Surgeon: Conrad Koppel, MD;  Location: [place] [facility];  Service: Cardiovascular;  Laterality: N/A;  . AMPUTATION Right 03/27/2020   Procedure: AMPUTATION RAY 5th;  Surgeon: Trula Slade, DPM;  Location: [place];  Service: Podiatry;  Laterality: Right;  .  AV FISTULA PLACEMENT Left 05/16/2017   Procedure: ARTERIOVENOUS (AV) FISTULA CREATION-LEFT;  Surgeon: Angelia Mould, MD;  Location: [place];  Service: Vascular;  Laterality: Left;  . CHOLECYSTECTOMY    . COLONOSCOPY    . ESOPHAGOGASTRODUODENOSCOPY N/A 01/23/2018   Procedure: ESOPHAGOGASTRODUODENOSCOPY (EGD);  Surgeon: Yetta Flock, MD;  Location: [facility] ENDOSCOPY;  Service: Gastroenterology;  Laterality: N/A;  . PANCREAS SURGERY    . PERIPHERAL VASCULAR BALLOON ANGIOPLASTY  02/05/2018   Procedure: PERIPHERAL VASCULAR BALLOON ANGIOPLASTY;  Surgeon: Conrad Weimar, MD;  Location: [place] [facility];  Service: Cardiovascular;;  left AV fistula  . SPLENECTOMY    . WOUND DEBRIDEMENT Right 03/30/2020   Procedure: DEBRIDEMENT WOUND RIGHT FOOT;  Surgeon: Trula Slade, DPM;  Location: [place];  Service: Podiatry;  Laterality: Right;   Social History   Occupational History  . Occupation: disability  Tobacco Use  . Smoking status: Current Every Day Smoker    Packs/day: 0.50    Years: 40.00    Pack years: 20.00    Types: Cigarettes  . Smokeless tobacco: Never Used  Vaping Use  . Vaping Use: Never used  Substance and Sexual Activity  . Alcohol use: No     Comment: quit around 2010; no h/o heavy use  . Drug use: No  . Sexual activity: Not on file

## 2020-12-07 ENCOUNTER — Other Ambulatory Visit: Payer: Self-pay

## 2020-12-07 ENCOUNTER — Ambulatory Visit (HOSPITAL_COMMUNITY)
Admission: RE | Admit: 2020-12-07 | Discharge: 2020-12-07 | Disposition: A | Discharge status: Home / Self Care | Payer: Medicare Other | Source: Ambulatory Visit | Attending: Gastroenterology | Admitting: Gastroenterology

## 2020-12-07 DIAGNOSIS — K7460 Unspecified cirrhosis of liver: Secondary | ICD-10-CM | POA: Diagnosis present

## 2020-12-19 ENCOUNTER — Other Ambulatory Visit: Payer: Self-pay

## 2020-12-19 ENCOUNTER — Ambulatory Visit (INDEPENDENT_AMBULATORY_CARE_PROVIDER_SITE_OTHER): Payer: Medicare Other

## 2020-12-19 ENCOUNTER — Ambulatory Visit (INDEPENDENT_AMBULATORY_CARE_PROVIDER_SITE_OTHER): Payer: Medicare Other | Admitting: Physician Assistant

## 2020-12-19 ENCOUNTER — Encounter: Payer: Self-pay | Admitting: Orthopedic Surgery

## 2020-12-19 DIAGNOSIS — S82874A Nondisplaced pilon fracture of right tibia, initial encounter for closed fracture: Secondary | ICD-10-CM

## 2020-12-19 MED ORDER — HYDROCODONE-ACETAMINOPHEN 5-325 MG PO TABS: 1.0000 | ORAL_TABLET | Freq: Four times a day (QID) | ORAL | 0 refills | Status: DC | PRN

## 2020-12-19 NOTE — Progress Notes (Signed)
Office Visit Note   Patient: Brian [name]           Date of Birth: [birth year]           MRN: [id number] Visit Date: 12/19/2020              Requested by: [name], MD [address] [place],  [place] [address] PCP: [name], MD  Chief Complaint  Patient presents with  . Right Ankle - Follow-up    DOI 11/02/20 conservative treatment Pilon fx         HPI: Patient presents in follow-up today for his right pilon fracture.  He is now 7 weeks since the fracture.  He has for the most part remain nonweightbearing per his report.  He has been in his boot no complaints  Assessment & Plan: Visit Diagnoses:  1. Closed nondisplaced pilon fracture of right tibia, initial encounter     Plan: Patient was reviewed with Dr. Sharol Given.  He may begin slowly placing weight on his ankle but he must have the use of his walker and be in his fracture boot.  He will follow-up in 3 weeks for reevaluation.  If he has any increased redness swelling or deformity he is to call us immediately.  He will need some long-term custom bracing for stability  Follow-Up Instructions: No follow-ups on file.   Ortho Exam  Patient is alert, oriented, no adenopathy, well-dressed, normal affect, normal respiratory effort. Right ankle mild to moderate soft tissue swelling no cellulitis no acute bony deformity he does have some laxity in the ankle joint.  Pulses are palpable.  No ascending cellulitis  Imaging: XR Ankle Complete Right  Result Date: 12/19/2020 Images of his ankle were reviewed today.  Overall well-maintained alignment through the joint.  Some callus around the fractures.  Significant disuse osteopenia x-rays are reviewed byr. Duda  No images are attached to the encounter.  Labs: Lab Results  Component Value Date   HGBA1C 6.5 (H) 03/26/2020   HGBA1C 6.0 (H) 01/22/2018   HGBA1C 5.8 (H) 02/05/2017   ESRSEDRATE 40 (H) 03/26/2020   ESRSEDRATE 18 (H) 09/12/2010   CRP 3.0 (H)  04/02/2020   CRP 4.2 (H) 04/01/2020   CRP 6.5 (H) 03/31/2020   REPTSTATUS 04/01/2020 FINAL 03/27/2020   GRAMSTAIN  03/27/2020    RARE WBC PRESENT,BOTH PMN AND MONONUCLEAR FEW GRAM NEGATIVE COCCOBACILLI    CULT  03/27/2020    FEW GEMELLA MORBILLORUM Standardized susceptibility testing for this organism is not available. NO ANAEROBES ISOLATED Performed at [facility], [place] [address] [place], [place] [address]      Lab Results  Component Value Date   ALBUMIN 3.0 (L) 03/26/2020   ALBUMIN 3.2 (L) 01/24/2018   ALBUMIN 3.1 (L) 01/23/2018   PREALBUMIN 6.8 (L) 03/26/2020    Lab Results  Component Value Date   MG 2.0 04/02/2020   MG 1.9 04/01/2020   No results found for: [facility]  Lab Results  Component Value Date   PREALBUMIN 6.8 (L) 03/26/2020   CBC EXTENDED Latest Ref Rng & Units 04/02/2020 04/01/2020 03/31/2020  WBC 4.0 - 10.5 K/uL 12.6(H) 13.1(H) 14.1(H)  RBC 4.22 - 5.81 MIL/uL 3.40(L) 3.58(L) 3.49(L)  HGB 13.0 - 17.0 g/dL 11.3(L) 12.1(L) 11.8(L)  HCT 39.0 - 52.0 % 35.1(L) 37.1(L) 36.1(L)  PLT 150 - 400 K/uL 324 327 370  NEUTROABS 1.7 - 7.7 K/uL 5.3 7.4 8.8(H)  LYMPHSABS 0.7 - 4.0 K/uL 4.5(H) 3.4 3.1  There is no height or weight on file to calculate BMI.  Orders:  Orders Placed This Encounter  Procedures  . XR Ankle Complete Right   No orders of the defined types were placed in this encounter.    Procedures: No procedures performed  Clinical Data: No additional findings.  ROS:  All other systems negative, except as noted in the HPI. Review of Systems  Objective: Vital Signs: There were no vitals taken for this visit.  Specialty Comments:  No specialty comments available.  PMFS History: Patient Active Problem List   Diagnosis Date Noted  . Personal history of diabetic foot ulcer 04/11/2020  . Diabetic infection of right foot ([place]) 03/26/2020  . Headache, unspecified 09/10/2019  . Diarrhea, unspecified 08/16/2019  . Encounter for  removal of sutures 07/28/2018  . Cryptogenic cirrhosis ([place]) 07/16/2018  . Encounter for immunization 07/01/2018  . Gastritis 01/23/2018  . Acute blood loss anemia 01/23/2018  . Duodenal ulcer   . Upper GI bleed 01/22/2018  . History of CVA (cerebrovascular accident) 01/15/2018  . Iron deficiency anemia, unspecified 11/25/2017  . Coagulation defect, unspecified ([place]) 11/18/2017  . Unspecified protein-calorie malnutrition ([place]) 11/11/2017  . Acquired absence of spleen 10/30/2017  . Nicotine dependence, unspecified, uncomplicated 51/76/1607  . Other disorders of phosphorus metabolism 10/30/2017  . Unspecified nephritic syndrome with unspecified morphologic changes 10/30/2017  . Anemia in chronic kidney disease 10/24/2017  . Dependence on renal dialysis ([place]) 10/24/2017  . Encounter for screening for respiratory tuberculosis 10/24/2017  . Pruritus, unspecified 10/24/2017  . Secondary hyperparathyroidism of renal origin ([place]) 10/24/2017  . Shortness of breath 10/24/2017  . Type 1 diabetes mellitus with kidney complication ([place]) - on insulin 05/01/2017  . Hemorrhagic stroke [facility])- Jan 2010 05/01/2017  . H/O hyperlipidemia- sev yrs ago 05/01/2017  . h/o Hypertriglyceridemia- 13000's in past 05/01/2017  . H/O splenectomy 05/01/2017  . Status post cholecystectomy 05/01/2017  . Gout- L foot- mult times 05/01/2017  . Diabetic neuropathy ([place])- 6 mo or so; never started meds 05/01/2017  . Vitamin D deficiency 05/01/2017  . ESRD on dialysis ([place]) 02/05/2017  . Anasarca- may 2018 hosp 02/05/2017  . Leukocytosis- s/p plenectomy 02/05/2017  . Hypertension   . Diabetes mellitus without complication ([place])   . Tobacco abuse- >er than 55 pack yr hx; current smoker    Past Medical History:  Diagnosis Date  . Arthritis   . Bleeding ulcer   . Cirrhosis ([place])   . Complication of anesthesia    " I had a nose bleed a day or two after surgery-it could have been the oxygen"  . Diabetes mellitus  without complication ([place])   . Diabetic neuropathy ([place])   . Elevated TSH   . ESRD on dialysis [facility])    MWF  . History of kidney stones   . Hypertension   . Stroke [facility])    hemorrhagic 2010  . Tobacco abuse     Family History  Problem Relation Age of Onset  . Lymphoma Mother   . Hypertension Father   . Diabetes Mellitus II Father   . Hypertension Brother   . Diabetes Mellitus I Brother   . Hypertension Brother   . Colon cancer Neg Hx   . Stomach cancer Neg Hx   . Colon polyps Neg Hx   . Esophageal cancer Neg Hx   . Rectal cancer Neg Hx     Past Surgical History:  Procedure Laterality Date  . A/V FISTULAGRAM N/A 02/05/2018   Procedure: A/V  FISTULAGRAM;  Surgeon: Conrad Burton, MD;  Location: [place] [facility];  Service: Cardiovascular;  Laterality: N/A;  . AMPUTATION Right 03/27/2020   Procedure: AMPUTATION RAY 5th;  Surgeon: Trula Slade, DPM;  Location: [place];  Service: Podiatry;  Laterality: Right;  . AV FISTULA PLACEMENT Left 05/16/2017   Procedure: ARTERIOVENOUS (AV) FISTULA CREATION-LEFT;  Surgeon: Angelia Mould, MD;  Location: [place];  Service: Vascular;  Laterality: Left;  . CHOLECYSTECTOMY    . COLONOSCOPY    . ESOPHAGOGASTRODUODENOSCOPY N/A 01/23/2018   Procedure: ESOPHAGOGASTRODUODENOSCOPY (EGD);  Surgeon: Yetta Flock, MD;  Location: [name] ENDOSCOPY;  Service: Gastroenterology;  Laterality: N/A;  . PANCREAS SURGERY    . PERIPHERAL VASCULAR BALLOON ANGIOPLASTY  02/05/2018   Procedure: PERIPHERAL VASCULAR BALLOON ANGIOPLASTY;  Surgeon: Conrad , MD;  Location: [place] [facility];  Service: Cardiovascular;;  left AV fistula  . SPLENECTOMY    . WOUND DEBRIDEMENT Right 03/30/2020   Procedure: DEBRIDEMENT WOUND RIGHT FOOT;  Surgeon: Trula Slade, DPM;  Location: [place];  Service: Podiatry;  Laterality: Right;   Social History   Occupational History  . Occupation: disability  Tobacco Use  . Smoking status: Current Every Day Smoker     Packs/day: 0.50    Years: 40.00    Pack years: 20.00    Types: Cigarettes  . Smokeless tobacco: Never Used  Vaping Use  . Vaping Use: Never used  Substance and Sexual Activity  . Alcohol use: No    Comment: quit around 2010; no h/o heavy use  . Drug use: No  . Sexual activity: Not on file

## 2020-12-22 ENCOUNTER — Other Ambulatory Visit: Payer: Self-pay | Admitting: Nephrology

## 2020-12-22 DIAGNOSIS — D49519 Neoplasm of unspecified behavior of unspecified kidney: Secondary | ICD-10-CM

## 2020-12-22 DIAGNOSIS — N186 End stage renal disease: Secondary | ICD-10-CM

## 2020-12-23 ENCOUNTER — Other Ambulatory Visit: Payer: Self-pay | Admitting: Gastroenterology

## 2021-01-09 ENCOUNTER — Encounter: Payer: Self-pay | Admitting: Orthopedic Surgery

## 2021-01-09 ENCOUNTER — Ambulatory Visit (INDEPENDENT_AMBULATORY_CARE_PROVIDER_SITE_OTHER): Payer: Medicare Other

## 2021-01-09 ENCOUNTER — Ambulatory Visit (INDEPENDENT_AMBULATORY_CARE_PROVIDER_SITE_OTHER): Payer: Medicare Other | Admitting: Orthopedic Surgery

## 2021-01-09 DIAGNOSIS — S82874A Nondisplaced pilon fracture of right tibia, initial encounter for closed fracture: Secondary | ICD-10-CM | POA: Diagnosis not present

## 2021-01-09 NOTE — Progress Notes (Signed)
Office Visit Note   Patient: Brian [name]           Date of Birth: September 15, 1961           MRN: [id number] Visit Date: 01/09/2021              Requested by: [name], MD [address] [place],  [place] [address] PCP: [name], MD  Chief Complaint  Patient presents with  . Right Ankle - Follow-up      HPI: Patient is a 60 year old gentleman with multiple medical problems he is currently on dialysis status post pilon fracture of the right ankle currently under conservative treatment.  Patient states he does get vitamin D3 with his dialysis.  Assessment & Plan: Visit Diagnoses:  1. Closed nondisplaced pilon fracture of right tibia, initial encounter     Plan: Patient shows no change in the alignment of the pilon fracture.  We will continue the fracture boot continue elevation nonweightbearing.  Three-view radiographs of the right ankle at follow-up.  Follow-Up Instructions: Return in about 3 weeks (around 01/30/2021).   Ortho Exam  Patient is alert, oriented, no adenopathy, well-dressed, normal affect, normal respiratory effort. Examination patient has intact skin soft tissue envelope around the ankle no redness no cellulitis no drainage.  There is no tenderness with manipulation of the ankle.  Clinically there is no consolidation at the fracture site.  Patient is 9 weeks out from his injury.  We will continue with the fracture boot protected weightbearing.  Imaging: XR Ankle Complete Right  Result Date: 01/09/2021 Three-view radiographs of the right ankle shows a congruent mortise with lytic bone changes around the fracture site calcification of the arteries no interval callus formation.  No images are attached to the encounter.  Labs: Lab Results  Component Value Date   HGBA1C 6.5 (H) 03/26/2020   HGBA1C 6.0 (H) 01/22/2018   HGBA1C 5.8 (H) 02/05/2017   ESRSEDRATE 40 (H) 03/26/2020   ESRSEDRATE 18 (H) 09/12/2010   CRP 3.0 (H) 04/02/2020    CRP 4.2 (H) 04/01/2020   CRP 6.5 (H) 03/31/2020   REPTSTATUS 04/01/2020 FINAL 03/27/2020   GRAMSTAIN  03/27/2020    RARE WBC PRESENT,BOTH PMN AND MONONUCLEAR FEW GRAM NEGATIVE COCCOBACILLI    CULT  03/27/2020    FEW GEMELLA MORBILLORUM Standardized susceptibility testing for this organism is not available. NO ANAEROBES ISOLATED Performed at [facility], [place] [address], [place], [place] [address]      Lab Results  Component Value Date   ALBUMIN 3.0 (L) 03/26/2020   ALBUMIN 3.2 (L) 01/24/2018   ALBUMIN 3.1 (L) 01/23/2018   PREALBUMIN 6.8 (L) 03/26/2020    Lab Results  Component Value Date   MG 2.0 04/02/2020   MG 1.9 04/01/2020   No results found for: [facility]  Lab Results  Component Value Date   PREALBUMIN 6.8 (L) 03/26/2020   CBC EXTENDED Latest Ref Rng & Units 04/02/2020 04/01/2020 03/31/2020  WBC 4.0 - 10.5 K/uL 12.6(H) 13.1(H) 14.1(H)  RBC 4.22 - 5.81 MIL/uL 3.40(L) 3.58(L) 3.49(L)  HGB 13.0 - 17.0 g/dL 11.3(L) 12.1(L) 11.8(L)  HCT 39.0 - 52.0 % 35.1(L) 37.1(L) 36.1(L)  PLT 150 - 400 K/uL 324 327 370  NEUTROABS 1.7 - 7.7 K/uL 5.3 7.4 8.8(H)  LYMPHSABS 0.7 - 4.0 K/uL 4.5(H) 3.4 3.1     There is no height or weight on file to calculate BMI.  Orders:  Orders Placed This Encounter  Procedures  . XR  Ankle Complete Right   No orders of the defined types were placed in this encounter.    Procedures: No procedures performed  Clinical Data: No additional findings.  ROS:  All other systems negative, except as noted in the HPI. Review of Systems  Objective: Vital Signs: There were no vitals taken for this visit.  Specialty Comments:  No specialty comments available.  PMFS History: Patient Active Problem List   Diagnosis Date Noted  . Personal history of diabetic foot ulcer 04/11/2020  . Diabetic infection of right foot ([place]) 03/26/2020  . Headache, unspecified 09/10/2019  . Diarrhea, unspecified 08/16/2019  . Encounter for removal of sutures  07/28/2018  . Cryptogenic cirrhosis ([place]) 07/16/2018  . Encounter for immunization 07/01/2018  . Gastritis 01/23/2018  . Acute blood loss anemia 01/23/2018  . Duodenal ulcer   . Upper GI bleed 01/22/2018  . History of CVA (cerebrovascular accident) 01/15/2018  . Iron deficiency anemia, unspecified 11/25/2017  . Coagulation defect, unspecified ([place]) 11/18/2017  . Unspecified protein-calorie malnutrition ([place]) 11/11/2017  . Acquired absence of spleen 10/30/2017  . Nicotine dependence, unspecified, uncomplicated 26/33/3545  . Other disorders of phosphorus metabolism 10/30/2017  . Unspecified nephritic syndrome with unspecified morphologic changes 10/30/2017  . Anemia in chronic kidney disease 10/24/2017  . Dependence on renal dialysis ([place]) 10/24/2017  . Encounter for screening for respiratory tuberculosis 10/24/2017  . Pruritus, unspecified 10/24/2017  . Secondary hyperparathyroidism of renal origin ([place]) 10/24/2017  . Shortness of breath 10/24/2017  . Type 1 diabetes mellitus with kidney complication ([place]) - on insulin 05/01/2017  . Hemorrhagic stroke [facility])- Jan 2010 05/01/2017  . H/O hyperlipidemia- sev yrs ago 05/01/2017  . h/o Hypertriglyceridemia- 13000's in past 05/01/2017  . H/O splenectomy 05/01/2017  . Status post cholecystectomy 05/01/2017  . Gout- L foot- mult times 05/01/2017  . Diabetic neuropathy ([place])- 6 mo or so; never started meds 05/01/2017  . Vitamin D deficiency 05/01/2017  . ESRD on dialysis ([place]) 02/05/2017  . Anasarca- may 2018 hosp 02/05/2017  . Leukocytosis- s/p plenectomy 02/05/2017  . Hypertension   . Diabetes mellitus without complication ([place])   . Tobacco abuse- >er than 55 pack yr hx; current smoker    Past Medical History:  Diagnosis Date  . Arthritis   . Bleeding ulcer   . Cirrhosis ([place])   . Complication of anesthesia    " I had a nose bleed a day or two after surgery-it could have been the oxygen"  . Diabetes mellitus without complication  ([place])   . Diabetic neuropathy ([place])   . Elevated TSH   . ESRD on dialysis [facility])    MWF  . History of kidney stones   . Hypertension   . Stroke [facility])    hemorrhagic 2010  . Tobacco abuse     Family History  Problem Relation Age of Onset  . Lymphoma Mother   . Hypertension Father   . Diabetes Mellitus II Father   . Hypertension Brother   . Diabetes Mellitus I Brother   . Hypertension Brother   . Colon cancer Neg Hx   . Stomach cancer Neg Hx   . Colon polyps Neg Hx   . Esophageal cancer Neg Hx   . Rectal cancer Neg Hx     Past Surgical History:  Procedure Laterality Date  . A/V FISTULAGRAM N/A 02/05/2018   Procedure: A/V FISTULAGRAM;  Surgeon: Conrad [place], MD;  Location: [place] [facility];  Service: Cardiovascular;  Laterality: N/A;  . AMPUTATION Right  03/27/2020   Procedure: AMPUTATION RAY 5th;  Surgeon: Trula Slade, DPM;  Location: [place];  Service: Podiatry;  Laterality: Right;  . AV FISTULA PLACEMENT Left 05/16/2017   Procedure: ARTERIOVENOUS (AV) FISTULA CREATION-LEFT;  Surgeon: Angelia Mould, MD;  Location: [place];  Service: Vascular;  Laterality: Left;  . CHOLECYSTECTOMY    . COLONOSCOPY    . ESOPHAGOGASTRODUODENOSCOPY N/A 01/23/2018   Procedure: ESOPHAGOGASTRODUODENOSCOPY (EGD);  Surgeon: Yetta Flock, MD;  Location: [facility] ENDOSCOPY;  Service: Gastroenterology;  Laterality: N/A;  . PANCREAS SURGERY    . PERIPHERAL VASCULAR BALLOON ANGIOPLASTY  02/05/2018   Procedure: PERIPHERAL VASCULAR BALLOON ANGIOPLASTY;  Surgeon: Conrad [place], MD;  Location: [place] CV [facility];  Service: Cardiovascular;;  left AV fistula  . SPLENECTOMY    . WOUND DEBRIDEMENT Right 03/30/2020   Procedure: DEBRIDEMENT WOUND RIGHT FOOT;  Surgeon: Trula Slade, DPM;  Location: [place];  Service: Podiatry;  Laterality: Right;   Social History   Occupational History  . Occupation: disability  Tobacco Use  . Smoking status: Current Every Day Smoker    Packs/day: 0.50    Years:  40.00    Pack years: 20.00    Types: Cigarettes  . Smokeless tobacco: Never Used  Vaping Use  . Vaping Use: Never used  Substance and Sexual Activity  . Alcohol use: No    Comment: quit around 2010; no h/o heavy use  . Drug use: No  . Sexual activity: Not on file

## 2021-01-11 ENCOUNTER — Ambulatory Visit
Admission: RE | Admit: 2021-01-11 | Discharge: 2021-01-11 | Disposition: A | Discharge status: Home / Self Care | Payer: Medicare Other | Source: Ambulatory Visit | Attending: Nephrology | Admitting: Nephrology

## 2021-01-11 DIAGNOSIS — N186 End stage renal disease: Secondary | ICD-10-CM

## 2021-01-11 DIAGNOSIS — D49519 Neoplasm of unspecified behavior of unspecified kidney: Secondary | ICD-10-CM

## 2021-01-30 ENCOUNTER — Ambulatory Visit (INDEPENDENT_AMBULATORY_CARE_PROVIDER_SITE_OTHER): Payer: Medicare Other | Admitting: Gastroenterology

## 2021-01-30 ENCOUNTER — Other Ambulatory Visit (INDEPENDENT_AMBULATORY_CARE_PROVIDER_SITE_OTHER): Payer: Medicare Other

## 2021-01-30 ENCOUNTER — Encounter: Payer: Self-pay | Admitting: Gastroenterology

## 2021-01-30 ENCOUNTER — Telehealth: Payer: Self-pay

## 2021-01-30 VITALS — BP 130/80 | HR 63 | Ht 70.0 in | Wt 180.0 lb

## 2021-01-30 DIAGNOSIS — I8500 Esophageal varices without bleeding: Secondary | ICD-10-CM

## 2021-01-30 DIAGNOSIS — K7460 Unspecified cirrhosis of liver: Secondary | ICD-10-CM

## 2021-01-30 DIAGNOSIS — Z86010 Personal history of colonic polyps: Secondary | ICD-10-CM | POA: Diagnosis not present

## 2021-01-30 LAB — COMPREHENSIVE METABOLIC PANEL
ALT: 9 U/L (ref 0–53)
AST: 22 U/L (ref 0–37)
Albumin: 3.1 g/dL — ABNORMAL LOW (ref 3.5–5.2)
Alkaline Phosphatase: 172 U/L — ABNORMAL HIGH (ref 39–117)
BUN: 26 mg/dL — ABNORMAL HIGH (ref 6–23)
CO2: 32 mEq/L (ref 19–32)
Calcium: 8.8 mg/dL (ref 8.4–10.5)
Chloride: 95 mEq/L — ABNORMAL LOW (ref 96–112)
Creatinine, Ser: 4.23 mg/dL — ABNORMAL HIGH (ref 0.40–1.50)
GFR: 14.74 mL/min — CL (ref >60.00)
Glucose, Bld: 88 mg/dL (ref 70–99)
Potassium: 3.2 mEq/L — ABNORMAL LOW (ref 3.5–5.1)
Sodium: 136 mEq/L (ref 135–145)
Total Bilirubin: 1.2 mg/dL (ref 0.2–1.2)
Total Protein: 7 g/dL (ref 6.0–8.3)

## 2021-01-30 LAB — CBC WITH DIFFERENTIAL/PLATELET
Basophils Absolute: 0.1 10*3/uL (ref 0.0–0.1)
Basophils Relative: 1.3 % (ref 0.0–3.0)
Eosinophils Absolute: 0.1 10*3/uL (ref 0.0–0.7)
Eosinophils Relative: 1.2 % (ref 0.0–5.0)
HCT: 33.4 % — ABNORMAL LOW (ref 39.0–52.0)
Hemoglobin: 11.3 g/dL — ABNORMAL LOW (ref 13.0–17.0)
Lymphocytes Relative: 31.4 % (ref 12.0–46.0)
Lymphs Abs: 2.7 10*3/uL (ref 0.7–4.0)
MCHC: 33.8 g/dL (ref 30.0–36.0)
MCV: 101.4 fl — ABNORMAL HIGH (ref 78.0–100.0)
Monocytes Absolute: 1.5 10*3/uL — ABNORMAL HIGH (ref 0.1–1.0)
Monocytes Relative: 17.2 % — ABNORMAL HIGH (ref 3.0–12.0)
Neutro Abs: 4.2 10*3/uL (ref 1.4–7.7)
Neutrophils Relative %: 48.9 % (ref 43.0–77.0)
Platelets: 200 10*3/uL (ref 150.0–400.0)
RBC: 3.29 Mil/uL — ABNORMAL LOW (ref 4.22–5.81)
RDW: 16.1 % — ABNORMAL HIGH (ref 11.5–15.5)
WBC: 8.7 10*3/uL (ref 4.0–10.5)

## 2021-01-30 LAB — PROTIME-INR
INR: 1 ratio (ref 0.8–1.0)
Prothrombin Time: 11.5 s (ref 9.6–13.1)

## 2021-01-30 MED ORDER — PLENVU 140 G PO SOLR: 1.0000 | ORAL | 0 refills | Status: DC

## 2021-01-30 NOTE — Telephone Encounter (Signed)
Faxed activated [employer] coupon code to pharmacy , [place].

## 2021-01-30 NOTE — Progress Notes (Signed)
HPI :  60 year old male here for follow-up visit for cirrhosis. He has a history of compensated cirrhosis (cryptogenic),end-stage renal disease on hemodialysis, pancreatic disease (s/p partial pancreatectomy & splenectomy), h/oduodenal ulcer.He has been followed by [facility] hepatology clinic for cirrhosis previously.  He has a history of end-stage renal disease and was referred to [facility] for consideration of dual liver kidney transplant.  He was told he is not a candidate for this program as long as he continues to smoke cigarettes.  He has ben unwilling to stop smoking at this time so has not been considered a transplant candidate. Serologic evaluation was negative for chronic viral hepatitis, autoimmune hepatitis, PBC, Wilson disease, and alpha-1 antitrypsin deficiency. Ferritin and % saturation have been non-specifically elevated, with negative HFE testing. Transjugular liver biopsy on 06/04/2018 (detailed below) demonstrated HVPG 13 mmHg (c/w CSPH) with histology demonstrating chronic active hepatitis with bridging fibrosis/cirrhosis and secondary iron accumulation.  He states he previously drank heavily on the weekends but that was several years ago, has not had any alcohol use in recent years.  States in regards to his liver disease he has been stable since have last seen him.  He has not been drinking any alcohol.  Denies any jaundice, no ascites, no major issues with fluid balance.  He does have a history of small esophageal varices noted 1 year ago, due for surveillance.  He takes metoprolol for control of his blood pressure.  He has never had a variceal bleed.  Denies any blood in stool.  No abdominal pains.  No problems with bowel habits.  He had a colonoscopy in 2019 in which she had 5 adenomas removed.  He does continue to smoke cigarettes.  Takes Protonix 20 mg a day for history of ulcers and GERD, states this works well to control his symptoms.  He most recently has been dealing with an  ankle fracture since the end of January.  They are hoping to avoid surgery for this and are continuing conservative measures, he is wearing a boot today.  He states he thinks it is slowly healing and hopefully to avoid surgery if at all possible.  He had a amputation of his right fifth toe last year when that got infected.  He states he is not pursuing transplant work-up at this time.  Prior workup: EGD 01/23/18 - 3 cm hiatal hernia. - Normal esophagus - Gastritis with heme noted throughout the stomach. Biopsied to rule out H pylori. - One cratered duodenal ulcer with 2 red spots, suspected vessels. Treated with a monopolar probe to prevent rebleeding.  Biopsies negative for H pylori  Colonoscopy 02/19/18 -The perianal and digital rectal examinations were normal. - A 5 mm polyp was found in the ileocecal valve. The polyp was sessile. The polyp was removed with a cold snare. Resection and retrieval were complete. - Three sessile polyps were found in the ascending colon. The polyps were 4 to 8 mm in size. These polyps were removed with a cold snare. Resection and retrieval were complete. - There was a medium-sized lipoma, in the ascending colon. - A 4 mm polyp was found in the transverse colon. The polyp was sessile. The polyp was removed with a cold snare. Resection and retrieval were complete. - A few medium-mouthed diverticula were found in the ascending colon and cecum. - Internal hemorrhoids were found during retroflexion. - The colon was extremely spastic which prolonged the procedure. The exam was otherwise without abnormality.  - all adenomas - repeat  colonoscopy in 3 years  EGD 03/07/2020 -  - A 2 cm hiatal hernia was present. Z line just slightly irregular but did not meet criteria for Barrett's - One column of suspected trace varix was found in the lower third of the esophagus, easily flattened with insufflation, no high risk stigmata noted. No significant varices otherwise -  The exam of the esophagus was otherwise normal. - Portal hypertensive gastropathy was found in the gastric fundus and in the gastric body diffusely with hypertrophied gastric folds. No gastric varices noted. - The exam of the stomach was otherwise normal. - Biopsies were taken with a cold forceps in the gastric body and in the gastric antrum for Helicobacter pylori testing. - The duodenal bulb and second portion of the duodenum were normal.  Repeat EGD in 1 year  Surgical [P], gastric antrum and gastric body - REACTIVE GASTROPATHY - NO H. PYLORI OR INTESTINAL METAPLASIA IDENTIFIED - SEE COMMENT  RUQ [place] 12/07/20 - IMPRESSION: Cirrhotic liver. No new focal lesion identified. Mild right upper quadrant ascites.   Past Medical History:  Diagnosis Date  . Arthritis   . Bleeding ulcer   . Cirrhosis ([place])   . Complication of anesthesia    " I had a nose bleed a day or two after surgery-it could have been the oxygen"  . Diabetes mellitus without complication ([place])   . Diabetic neuropathy ([place])   . Elevated TSH   . ESRD on dialysis [facility])    MWF  . History of kidney stones   . Hypertension   . Stroke [facility])    hemorrhagic 2010  . Tobacco abuse      Past Surgical History:  Procedure Laterality Date  . A/V FISTULAGRAM N/A 02/05/2018   Procedure: A/V FISTULAGRAM;  Surgeon: Conrad [place], MD;  Location: [place] [facility];  Service: Cardiovascular;  Laterality: N/A;  . AMPUTATION Right 03/27/2020   Procedure: AMPUTATION RAY 5th;  Surgeon: Trula Slade, DPM;  Location: [place];  Service: Podiatry;  Laterality: Right;  . AV FISTULA PLACEMENT Left 05/16/2017   Procedure: ARTERIOVENOUS (AV) FISTULA CREATION-LEFT;  Surgeon: Angelia Mould, MD;  Location: [place];  Service: Vascular;  Laterality: Left;  . CHOLECYSTECTOMY    . COLONOSCOPY    . ESOPHAGOGASTRODUODENOSCOPY N/A 01/23/2018   Procedure: ESOPHAGOGASTRODUODENOSCOPY (EGD);  Surgeon: Yetta Flock, MD;  Location[facility]  ENDOSCOPY;  Service: Gastroenterology;  Laterality: N/A;  . PANCREAS SURGERY    . PERIPHERAL VASCULAR BALLOON ANGIOPLASTY  02/05/2018   Procedure: PERIPHERAL VASCULAR BALLOON ANGIOPLASTY;  Surgeon: Conrad [place], MD;  Location: [place] CV LAB;  Service: Cardiovascular;;  left AV fistula  . SPLENECTOMY    . WOUND DEBRIDEMENT Right 03/30/2020   Procedure: DEBRIDEMENT WOUND RIGHT FOOT;  Surgeon: Trula Slade, DPM;  Location: [place];  Service: Podiatry;  Laterality: Right;   Family History  Problem Relation Age of Onset  . Lymphoma Mother   . Hypertension Father   . Diabetes Mellitus II Father   . Hypertension Brother   . Diabetes Mellitus I Brother   . Hypertension Brother   . Colon cancer Neg Hx   . Stomach cancer Neg Hx   . Colon polyps Neg Hx   . Esophageal cancer Neg Hx   . Rectal cancer Neg Hx    Social History   Tobacco Use  . Smoking status: Current Every Day Smoker    Packs/day: 0.50    Years: 40.00    Pack years: 20.00  Types: Cigarettes  . Smokeless tobacco: Never Used  Vaping Use  . Vaping Use: Never used  Substance Use Topics  . Alcohol use: No    Comment: quit around 2010; no h/o heavy use  . Drug use: No   Current Outpatient Medications  Medication Sig Dispense Refill  . acetaminophen (TYLENOL) 325 MG tablet Take 650 mg by mouth every 6 (six) hours as needed.    Marland Kitchen amLODipine (NORVASC) 10 MG tablet Take 1 tablet (10 mg total) by mouth daily. 30 tablet 0  . insulin aspart protamine- aspart (NOVOLOG MIX 70/30) (70-30) 100 UNIT/ML injection Inject 0.05 mLs (5 Units total) into the skin at bedtime. (Patient taking differently: Inject 5-10 Units into the skin at bedtime. Depending on blood sugar.) 10 mL 0  . metoprolol tartrate (LOPRESSOR) 50 MG tablet Take 1 tablet (50 mg total) by mouth 2 (two) times daily. 60 tablet 0  . pantoprazole (PROTONIX) 20 MG tablet Take 1 tablet by mouth once daily 90 tablet 0   No current facility-administered medications for  this visit.   Allergies  Allergen Reactions  . Penicillins Hives and Rash    Has patient had a PCN reaction causing immediate rash, facial/tongue/throat swelling, SOB or lightheadedness with hypotension: Yes Has patient had a PCN reaction causing severe rash involving mucus membranes or skin necrosis: No Has patient had a PCN reaction that required hospitalization: No Has patient had a PCN reaction occurring within the last 10 years: No If all of the above answers are "NO", then may proceed with Cephalosporin use. Ceftriaxone = no issues  . Nsaids     Due to kidneys  . Aspirin Nausea And Vomiting    Other Reaction: GI Upset     Review of Systems: All systems reviewed and negative except where noted in HPI.    US RENAL  Result Date: 01/13/2021 CLINICAL DATA:  End-stage renal disease.  History of renal neoplasm. EXAM: RENAL / URINARY TRACT ULTRASOUND COMPLETE COMPARISON:  Renal ultrasound Feb 05, 2017 and CT of the abdomen and pelvis August 18, 2018. FINDINGS: Right Kidney: Renal measurements: 9.0 x 4.2 x 5.3 cm = volume: 105 mL. There is a calcified cystic mass in the upper kidney measuring 1.9 x 1.7 x 2.2 cm, also seen on CT imaging from November of 2019. Increased cortical echogenicity. Left Kidney: Renal measurements: 7.7 x 2.9 x 3.7 cm = volume: 44 mL. There is a 1.3 cm cyst in the left kidney. Increased cortical echogenicity. Bladder: Appears normal for degree of bladder distention. Other: There is a nodular liver contour.  Ascites. IMPRESSION: 1. The large calcification in the upper right kidney with surrounding fluid was also seen in 2019, of no significance. 2. Medical renal disease. 3. Known cirrhosis.  Ascites. Electronically Signed   By: Dorise Bullion III M.D   On: 01/13/2021 11:19   XR Ankle Complete Right  Result Date: 01/09/2021 Three-view radiographs of the right ankle shows a congruent mortise with lytic bone changes around the fracture site calcification of the arteries  no interval callus formation.   Physical Exam: BP 130/80   Pulse 63   Ht 5\' 10"  (1.778 m)   Wt 180 lb (81.6 kg)   BMI 25.83 kg/m  Constitutional: Pleasant,well-developed, male in no acute distress. Abdominal: Soft, nondistended, nontender.  There are no masses palpable.  Neurological: Alert and oriented to person place and time. Psychiatric: Normal mood and affect. Behavior is normal.   ASSESSMENT AND PLAN: 60 year old male here for  reassessment of the following issues:  Cirrhosis Esophageal varices History of colon polyps  Overall stable in regards to his liver disease since I've seen him.  Major issue for him has been his ankle fracture, hopefully can avoid surgery for this.  He understands he is at high risk for surgery, could increase his risk for decompensation of his liver disease, he is following up with orthopedics.  Otherwise he is continue to avoid drinking alcohol.  I discussed his history of cirrhosis, risks for decompensation and HCC.  He is due for basic labs today.  He is due for [facility] screening in September.  We discussed his very small esophageal varix on last EGD, seems low risk for bleeding but due for another endoscopy to reassess the size of this.  Alternatively he could consider switching his metoprolol to either Coreg or nadolol and avoid further surveillance.  We discussed these options.  Given he is due for colonoscopy now he wishes to pursue endoscopy at this time.  I discussed risk and benefits of this and he wants to proceed.  Colonoscopy done for history of colon polyps.  Further recommendations pending results of these exams.  He will contact me in the interim should any issues arise.  Plan: - labs today - CBC, CMET, INR, AFP - repeat US for [facility] screening 9/22 - upper endoscopy to survey small esophageal varix - colonoscopy for surveillance of polyps - continue alcohol abstinence. Tobacco cessation recommended, patient is not interested - follow up in 6  months  [place] Cellar, MD [facility] Gastroenterology

## 2021-01-30 NOTE — Patient Instructions (Addendum)
If you are age 58 or younger, your body mass index should be between 19-25. Your Body mass index is 25.83 kg/m. If this is out of the aformentioned range listed, please consider follow up with your Primary Care Provider.    You have been scheduled for an endoscopy and colonoscopy. Please follow the written instructions given to you at your visit today. Please pick up your prep supplies at the pharmacy within the next 1-3 days. If you use inhalers (even only as needed), please bring them with you on the day of your procedure.   Please go to the lab in the basement of our building to have lab work done as you leave today. Hit "B" for basement when you get on the elevator.  When the doors open the lab is on your left.  We will call you with the results. Thank you.  Due to recent changes in healthcare laws, you may see the results of your imaging and laboratory studies on MyChart before your provider has had a chance to review them.  We understand that in some cases there may be results that are confusing or concerning to you. Not all laboratory results come back in the same time frame and the provider may be waiting for multiple results in order to interpret others.  Please give [place] 48 hours in order for your provider to thoroughly review all the results before contacting the office for clarification of your results.   You will be due for a recall RUQ ultrasound in 06-2021.   Thank you for entrusting me with your care and for choosing [facility], Dr. [place] Cellar

## 2021-01-31 LAB — AFP TUMOR MARKER: AFP-Tumor Marker: 1.2 ng/mL (ref <6.1)

## 2021-02-01 ENCOUNTER — Ambulatory Visit (INDEPENDENT_AMBULATORY_CARE_PROVIDER_SITE_OTHER): Payer: Medicare Other | Admitting: Orthopedic Surgery

## 2021-02-01 ENCOUNTER — Ambulatory Visit (INDEPENDENT_AMBULATORY_CARE_PROVIDER_SITE_OTHER): Payer: Medicare Other

## 2021-02-01 ENCOUNTER — Encounter: Payer: Self-pay | Admitting: Orthopedic Surgery

## 2021-02-01 DIAGNOSIS — M14671 Charcot's joint, right ankle and foot: Secondary | ICD-10-CM

## 2021-02-01 DIAGNOSIS — S82899K Other fracture of unspecified lower leg, subsequent encounter for closed fracture with nonunion: Secondary | ICD-10-CM | POA: Diagnosis not present

## 2021-02-01 DIAGNOSIS — S92909K Unspecified fracture of unspecified foot, subsequent encounter for fracture with nonunion: Secondary | ICD-10-CM

## 2021-02-01 DIAGNOSIS — S82874A Nondisplaced pilon fracture of right tibia, initial encounter for closed fracture: Secondary | ICD-10-CM

## 2021-02-01 DIAGNOSIS — S82874S Nondisplaced pilon fracture of right tibia, sequela: Secondary | ICD-10-CM | POA: Diagnosis not present

## 2021-02-01 NOTE — Progress Notes (Addendum)
Office Visit Note   Patient: Brian [name]           Date of Birth: [birth year]           MRN: [id number] Visit Date: 02/01/2021              Requested by: [name], MD [address] [place],  [place] [address] PCP: [name], MD  Chief Complaint  Patient presents with  . Right Ankle - Follow-up    DOI 11/02/20 right Pilon fx       HPI: Patient is a 60 year old gentleman who is seen in follow-up for Charcot collapse pilon fracture right ankle.  Patient has a history of type 1 diabetes currently with excellent control.  Patient has been compliant with the fracture boot and nonweightbearing.  Assessment & Plan: Visit Diagnoses:  1. Closed nondisplaced pilon fracture of right tibia, initial encounter   2. Charcot ankle, right     Plan: We will request authorization for a bone stimulator.  Discussed treatment options.  Do not feel that patient's microcirculation would heal a surgical intervention.  Feel the best options are continued with immobilization trial of the bone stimulator versus a transtibial amputation.  Feel that we can get the patient fitted for his prosthesis in about 6 weeks if we proceeded with surgery.  I have reviewed this with family during the visit.  I will also review this with my partners to see if there are any other options.  Anticipate follow-up for application of a bone stimulator.  Dr. Ninfa Linden reviewed the patient's case and radiographs and in his opinion the best option would be to proceed with a below the knee amputation.  Follow-Up Instructions: Return in about 4 weeks (around 03/01/2021).   Ortho Exam  Patient is alert, oriented, no adenopathy, well-dressed, normal affect, normal respiratory effort. Examination there is no redness no cellulitis the skin is intact his foot is plantigrade the ankle pilon fracture is unstable.  Patient does have a palpable dorsalis pedis pulse.  There is no varus or valgus malalignment no  plantar ulcers.  Despite the callus formation the ankle is unstable and has a nonunion.  Imaging: XR Ankle Complete Right  Result Date: 02/01/2021 Three-view radiographs of the right ankle shows stable alignment with no varus or valgus malalignment.  The fragments are impacted there is hypertrophic callus at the fracture site there is calcification of the arteries to the ankle.  No images are attached to the encounter.  Labs: Lab Results  Component Value Date   HGBA1C 6.5 (H) 03/26/2020   HGBA1C 6.0 (H) 01/22/2018   HGBA1C 5.8 (H) 02/05/2017   ESRSEDRATE 40 (H) 03/26/2020   ESRSEDRATE 18 (H) 09/12/2010   CRP 3.0 (H) 04/02/2020   CRP 4.2 (H) 04/01/2020   CRP 6.5 (H) 03/31/2020   REPTSTATUS 04/01/2020 FINAL 03/27/2020   GRAMSTAIN  03/27/2020    RARE WBC PRESENT,BOTH PMN AND MONONUCLEAR FEW GRAM NEGATIVE COCCOBACILLI    CULT  03/27/2020    FEW GEMELLA MORBILLORUM Standardized susceptibility testing for this organism is not available. NO ANAEROBES ISOLATED Performed at [facility], [place] [address], [place], [place] [address]      Lab Results  Component Value Date   ALBUMIN 3.1 (L) 01/30/2021   ALBUMIN 3.0 (L) 03/26/2020   ALBUMIN 3.2 (L) 01/24/2018   PREALBUMIN 6.8 (L) 03/26/2020    Lab Results  Component Value Date   MG 2.0 04/02/2020   MG 1.9  04/01/2020   No results found for: [facility]  Lab Results  Component Value Date   PREALBUMIN 6.8 (L) 03/26/2020   CBC EXTENDED Latest Ref Rng & Units 01/30/2021 04/02/2020 04/01/2020  WBC 4.0 - 10.5 K/uL 8.7 12.6(H) 13.1(H)  RBC 4.22 - 5.81 Mil/uL 3.29(L) 3.40(L) 3.58(L)  HGB 13.0 - 17.0 g/dL 11.3(L) 11.3(L) 12.1(L)  HCT 39.0 - 52.0 % 33.4(L) 35.1(L) 37.1(L)  PLT 150.0 - 400.0 K/uL 200.0 324 327  NEUTROABS 1.4 - 7.7 K/uL 4.2 5.3 7.4  LYMPHSABS 0.7 - 4.0 K/uL 2.7 4.5(H) 3.4     There is no height or weight on file to calculate BMI.  Orders:  Orders Placed This Encounter  Procedures  . XR Ankle Complete Right    No orders of the defined types were placed in this encounter.    Procedures: No procedures performed  Clinical Data: No additional findings.  ROS:  All other systems negative, except as noted in the HPI. Review of Systems  Objective: Vital Signs: There were no vitals taken for this visit.  Specialty Comments:  No specialty comments available.  PMFS History: Patient Active Problem List   Diagnosis Date Noted  . Personal history of diabetic foot ulcer 04/11/2020  . Diabetic infection of right foot ([place]) 03/26/2020  . Headache, unspecified 09/10/2019  . Diarrhea, unspecified 08/16/2019  . Encounter for removal of sutures 07/28/2018  . Cryptogenic cirrhosis ([place]) 07/16/2018  . Encounter for immunization 07/01/2018  . Gastritis 01/23/2018  . Acute blood loss anemia 01/23/2018  . Duodenal ulcer   . Upper GI bleed 01/22/2018  . History of CVA (cerebrovascular accident) 01/15/2018  . Iron deficiency anemia, unspecified 11/25/2017  . Coagulation defect, unspecified ([place]) 11/18/2017  . Unspecified protein-calorie malnutrition ([place]) 11/11/2017  . Acquired absence of spleen 10/30/2017  . Nicotine dependence, unspecified, uncomplicated 94/76/5465  . Other disorders of phosphorus metabolism 10/30/2017  . Unspecified nephritic syndrome with unspecified morphologic changes 10/30/2017  . Anemia in chronic kidney disease 10/24/2017  . Dependence on renal dialysis ([place]) 10/24/2017  . Encounter for screening for respiratory tuberculosis 10/24/2017  . Pruritus, unspecified 10/24/2017  . Secondary hyperparathyroidism of renal origin ([place]) 10/24/2017  . Shortness of breath 10/24/2017  . Type 1 diabetes mellitus with kidney complication ([place]) - on insulin 05/01/2017  . Hemorrhagic stroke [facility])- Jan 2010 05/01/2017  . H/O hyperlipidemia- sev yrs ago 05/01/2017  . h/o Hypertriglyceridemia- 13000's in past 05/01/2017  . H/O splenectomy 05/01/2017  . Status post cholecystectomy  05/01/2017  . Gout- L foot- mult times 05/01/2017  . Diabetic neuropathy ([place])- 6 mo or so; never started meds 05/01/2017  . Vitamin D deficiency 05/01/2017  . ESRD on dialysis ([place]) 02/05/2017  . Anasarca- may 2018 hosp 02/05/2017  . Leukocytosis- s/p plenectomy 02/05/2017  . Hypertension   . Diabetes mellitus without complication ([place])   . Tobacco abuse- >er than 55 pack yr hx; current smoker    Past Medical History:  Diagnosis Date  . Arthritis   . Bleeding ulcer   . Cirrhosis ([place])   . Complication of anesthesia    " I had a nose bleed a day or two after surgery-it could have been the oxygen"  . Diabetes mellitus without complication ([place])   . Diabetic neuropathy ([place])   . Elevated TSH   . ESRD on dialysis [facility])    MWF  . History of kidney stones   . Hypertension   . Stroke [facility])    hemorrhagic 2010  . Tobacco abuse  Family History  Problem Relation Age of Onset  . Lymphoma Mother   . Hypertension Father   . Diabetes Mellitus II Father   . Hypertension Brother   . Diabetes Mellitus I Brother   . Hypertension Brother   . Colon cancer Neg Hx   . Stomach cancer Neg Hx   . Colon polyps Neg Hx   . Esophageal cancer Neg Hx   . Rectal cancer Neg Hx     Past Surgical History:  Procedure Laterality Date  . A/V FISTULAGRAM N/A 02/05/2018   Procedure: A/V FISTULAGRAM;  Surgeon: Conrad [place], MD;  Location: [place] [facility];  Service: Cardiovascular;  Laterality: N/A;  . AMPUTATION Right 03/27/2020   Procedure: AMPUTATION RAY 5th;  Surgeon: Trula Slade, DPM;  Location: [place];  Service: Podiatry;  Laterality: Right;  . AV FISTULA PLACEMENT Left 05/16/2017   Procedure: ARTERIOVENOUS (AV) FISTULA CREATION-LEFT;  Surgeon: Angelia Mould, MD;  Location: [place];  Service: Vascular;  Laterality: Left;  . CHOLECYSTECTOMY    . COLONOSCOPY    . ESOPHAGOGASTRODUODENOSCOPY N/A 01/23/2018   Procedure: ESOPHAGOGASTRODUODENOSCOPY (EGD);  Surgeon: Yetta Flock,  MD;  Location: [facility] END[facility];  Service: Gastroenterology;  Laterality: N/A;  . PANCREAS SURGERY    . PERIPHERAL VASCULAR BALLOON ANGIOPLASTY  02/05/2018   Procedure: PERIPHERAL VASCULAR BALLOON ANGIOPLASTY;  Surgeon: Conrad , MD;  Location: [place] [facility];  Service: Cardiovascular;;  left AV fistula  . SPLENECTOMY    . WOUND DEBRIDEMENT Right 03/30/2020   Procedure: DEBRIDEMENT WOUND RIGHT FOOT;  Surgeon: Trula Slade, DPM;  Location: [place];  Service: Podiatry;  Laterality: Right;   Social History   Occupational History  . Occupation: disability  Tobacco Use  . Smoking status: Current Every Day Smoker    Packs/day: 0.50    Years: 40.00    Pack years: 20.00    Types: Cigarettes  . Smokeless tobacco: Never Used  Vaping Use  . Vaping Use: Never used  Substance and Sexual Activity  . Alcohol use: No    Comment: quit around 2010; no h/o heavy use  . Drug use: No  . Sexual activity: Not on file

## 2021-02-09 ENCOUNTER — Telehealth: Payer: Self-pay

## 2021-02-09 NOTE — Telephone Encounter (Signed)
Reached out to [employer] with [employer] the day of pt's appt.  She came to the office and picked up the printed xrays from the original injury that we have access to and the xrays preformed the day of most recent appt. Demo sheet. Office notes and insurance information. To day completed a docusign CMN and holding for Dr. Sharol Given original signature once this is complete will fax to [phone] and the processing for the bone stimulator will be complete and Judson Roch will reach out to the pt and set up for placement.

## 2021-02-15 ENCOUNTER — Telehealth: Payer: Self-pay | Admitting: Physician Assistant

## 2021-02-15 NOTE — Telephone Encounter (Signed)
Patient needs referral sent to [email or web address] phone number [phone] for pt to have transportation to dialysis. Patient states transportation stated the referral can come from any [facility] facility. Patient phone number is [phone].

## 2021-02-16 ENCOUNTER — Telehealth: Payer: Self-pay | Admitting: Physician Assistant

## 2021-02-16 NOTE — Telephone Encounter (Signed)
Called and informed pt to contact his PCP for referral for transportation per Renato Gails. Patient stated he would contact PCP for referral.

## 2021-02-16 NOTE — Telephone Encounter (Signed)
Can you please tell him to get this from his PCP, I wouldn't even know how to do this

## 2021-02-22 ENCOUNTER — Telehealth: Payer: Self-pay

## 2021-02-22 NOTE — Telephone Encounter (Signed)
Pt had been set up with [employer] for a bone stimulator and they were to set for education of device and placement. The pt's sister called and advised that they received the device and calling to see " where the break is" they had been advised that they could apply themselves with the instruction of a youtube video by the rep and just needed to know where to place it. I advised that I was going to get in touch with the rep that this was not the education that was agreed upon last week and they need to make an appt either here in our office with the pt or at his home as dicussed previously.

## 2021-02-22 NOTE — Telephone Encounter (Signed)
Message to Judson Roch with [employer] and she advised that she was going to meet the patient at his appt this coming Tuesday to go over application and instructions. I called the pt's sister and advised them to bring the device to the appt voiced understanding and will call with any other questions.

## 2021-02-27 ENCOUNTER — Ambulatory Visit (INDEPENDENT_AMBULATORY_CARE_PROVIDER_SITE_OTHER): Payer: Medicare Other

## 2021-02-27 ENCOUNTER — Ambulatory Visit (INDEPENDENT_AMBULATORY_CARE_PROVIDER_SITE_OTHER): Payer: Medicare Other | Admitting: Physician Assistant

## 2021-02-27 ENCOUNTER — Encounter: Payer: Self-pay | Admitting: Physician Assistant

## 2021-02-27 DIAGNOSIS — M25571 Pain in right ankle and joints of right foot: Secondary | ICD-10-CM

## 2021-02-27 DIAGNOSIS — S82874S Nondisplaced pilon fracture of right tibia, sequela: Secondary | ICD-10-CM

## 2021-02-27 NOTE — Progress Notes (Signed)
Office Visit Note   Patient: Brian [name]           Date of Birth: Sep 19, 1961           MRN: [id number] Visit Date: 02/27/2021              Requested by: [name], MD [address] [place],  [place] [address] PCP: [name], MD  No chief complaint on file.     HPI: Patient presents in follow-up today for his Right pilon fracture.  He has had slow healing.  He has been just putting a little bit of weight on it with his cam walker boot Assessment & Plan: Visit Diagnoses:  1. Closed nondisplaced pilon fracture of right tibia, sequela     Plan: Bone spit stimulator will be placed today.  We will follow-up in 6 weeks for new x-rays representative from the company is here to show him how to use the bone stimulator  Follow-Up Instructions: No follow-ups on file.   Ortho Exam  Patient is alert, oriented, no adenopathy, well-dressed, normal affect, normal respiratory effort. Right ankle skin is in good condition no cellulitis swelling is well controlled no signs of infection or skin breakdown  Imaging: No results found. No images are attached to the encounter.  Labs: Lab Results  Component Value Date   HGBA1C 6.5 (H) 03/26/2020   HGBA1C 6.0 (H) 01/22/2018   HGBA1C 5.8 (H) 02/05/2017   ESRSEDRATE 40 (H) 03/26/2020   ESRSEDRATE 18 (H) 09/12/2010   CRP 3.0 (H) 04/02/2020   CRP 4.2 (H) 04/01/2020   CRP 6.5 (H) 03/31/2020   REPTSTATUS 04/01/2020 FINAL 03/27/2020   GRAMSTAIN  03/27/2020    RARE WBC PRESENT,BOTH PMN AND MONONUCLEAR FEW GRAM NEGATIVE COCCOBACILLI    CULT  03/27/2020    FEW GEMELLA MORBILLORUM Standardized susceptibility testing for this organism is not available. NO ANAEROBES ISOLATED Performed at [facility], [place] [address] [place], [place] [address]      Lab Results  Component Value Date   ALBUMIN 3.1 (L) 01/30/2021   ALBUMIN 3.0 (L) 03/26/2020   ALBUMIN 3.2 (L) 01/24/2018   PREALBUMIN 6.8 (L) 03/26/2020     Lab Results  Component Value Date   MG 2.0 04/02/2020   MG 1.9 04/01/2020   No results found for: [facility]  Lab Results  Component Value Date   PREALBUMIN 6.8 (L) 03/26/2020   CBC EXTENDED Latest Ref Rng & Units 01/30/2021 04/02/2020 04/01/2020  WBC 4.0 - 10.5 K/uL 8.7 12.6(H) 13.1(H)  RBC 4.22 - 5.81 Mil/uL 3.29(L) 3.40(L) 3.58(L)  HGB 13.0 - 17.0 g/dL 11.3(L) 11.3(L) 12.1(L)  HCT 39.0 - 52.0 % 33.4(L) 35.1(L) 37.1(L)  PLT 150.0 - 400.0 K/uL 200.0 324 327  NEUTROABS 1.4 - 7.7 K/uL 4.2 5.3 7.4  LYMPHSABS 0.7 - 4.0 K/uL 2.7 4.5(H) 3.4     There is no height or weight on file to calculate BMI.  Orders:  Orders Placed This Encounter  Procedures  . XR Ankle Complete Right   No orders of the defined types were placed in this encounter.    Procedures: No procedures performed  Clinical Data: No additional findings.  ROS:  All other systems negative, except as noted in the HPI. Review of Systems  Objective: Vital Signs: There were no vitals taken for this visit.  Specialty Comments:  No specialty comments available.  PMFS History: Patient Active Problem List   Diagnosis Date Noted  . Personal history of  diabetic foot ulcer 04/11/2020  . Diabetic infection of right foot ([place]) 03/26/2020  . Headache, unspecified 09/10/2019  . Diarrhea, unspecified 08/16/2019  . Encounter for removal of sutures 07/28/2018  . Cryptogenic cirrhosis ([place]) 07/16/2018  . Encounter for immunization 07/01/2018  . Gastritis 01/23/2018  . Acute blood loss anemia 01/23/2018  . Duodenal ulcer   . Upper GI bleed 01/22/2018  . History of CVA (cerebrovascular accident) 01/15/2018  . Iron deficiency anemia, unspecified 11/25/2017  . Coagulation defect, unspecified ([place]) 11/18/2017  . Unspecified protein-calorie malnutrition ([place]) 11/11/2017  . Acquired absence of spleen 10/30/2017  . Nicotine dependence, unspecified, uncomplicated 15/17/6160  . Other disorders of phosphorus metabolism  10/30/2017  . Unspecified nephritic syndrome with unspecified morphologic changes 10/30/2017  . Anemia in chronic kidney disease 10/24/2017  . Dependence on renal dialysis ([place]) 10/24/2017  . Encounter for screening for respiratory tuberculosis 10/24/2017  . Pruritus, unspecified 10/24/2017  . Secondary hyperparathyroidism of renal origin ([place]) 10/24/2017  . Shortness of breath 10/24/2017  . Type 1 diabetes mellitus with kidney complication ([place]) - on insulin 05/01/2017  . Hemorrhagic stroke [facility])- Jan 2010 05/01/2017  . H/O hyperlipidemia- sev yrs ago 05/01/2017  . h/o Hypertriglyceridemia- 13000's in past 05/01/2017  . H/O splenectomy 05/01/2017  . Status post cholecystectomy 05/01/2017  . Gout- L foot- mult times 05/01/2017  . Diabetic neuropathy ([name])- 6 mo or so; never started meds 05/01/2017  . Vitamin D deficiency 05/01/2017  . ESRD on dialysis ([place]) 02/05/2017  . Anasarca- may 2018 hosp 02/05/2017  . Leukocytosis- s/p plenectomy 02/05/2017  . Hypertension   . Diabetes mellitus without complication ([place])   . Tobacco abuse- >er than 55 pack yr hx; current smoker    Past Medical History:  Diagnosis Date  . Arthritis   . Bleeding ulcer   . Cirrhosis ([place])   . Complication of anesthesia    " I had a nose bleed a day or two after surgery-it could have been the oxygen"  . Diabetes mellitus without complication ([place])   . Diabetic neuropathy ([place])   . Elevated TSH   . ESRD on dialysis [facility])    MWF  . History of kidney stones   . Hypertension   . Stroke [facility])    hemorrhagic 2010  . Tobacco abuse     Family History  Problem Relation Age of Onset  . Lymphoma Mother   . Hypertension Father   . Diabetes Mellitus II Father   . Hypertension Brother   . Diabetes Mellitus I Brother   . Hypertension Brother   . Colon cancer Neg Hx   . Stomach cancer Neg Hx   . Colon polyps Neg Hx   . Esophageal cancer Neg Hx   . Rectal cancer Neg Hx     Past Surgical History:  Procedure  Laterality Date  . A/V FISTULAGRAM N/A 02/05/2018   Procedure: A/V FISTULAGRAM;  Surgeon: Conrad [place], MD;  Location: [place] [facility];  Service: Cardiovascular;  Laterality: N/A;  . AMPUTATION Right 03/27/2020   Procedure: AMPUTATION RAY 5th;  Surgeon: Trula Slade, DPM;  Location: [place];  Service: Podiatry;  Laterality: Right;  . AV FISTULA PLACEMENT Left 05/16/2017   Procedure: ARTERIOVENOUS (AV) FISTULA CREATION-LEFT;  Surgeon: Angelia Mould, MD;  Location: [place];  Service: Vascular;  Laterality: Left;  . CHOLECYSTECTOMY    . COLONOSCOPY    . ESOPHAGOGASTRODUODENOSCOPY N/A 01/23/2018   Procedure: ESOPHAGOGASTRODUODENOSCOPY (EGD);  Surgeon: Yetta Flock, MD;  Location[facility]  ENDOSCOPY;  Service: Gastroenterology;  Laterality: N/A;  . PANCREAS SURGERY    . PERIPHERAL VASCULAR BALLOON ANGIOPLASTY  02/05/2018   Procedure: PERIPHERAL VASCULAR BALLOON ANGIOPLASTY;  Surgeon: Conrad [place], MD;  Location: [place] [facility];  Service: Cardiovascular;;  left AV fistula  . SPLENECTOMY    . WOUND DEBRIDEMENT Right 03/30/2020   Procedure: DEBRIDEMENT WOUND RIGHT FOOT;  Surgeon: Trula Slade, DPM;  Location: [place];  Service: Podiatry;  Laterality: Right;   Social History   Occupational History  . Occupation: disability  Tobacco Use  . Smoking status: Current Every Day Smoker    Packs/day: 0.50    Years: 40.00    Pack years: 20.00    Types: Cigarettes  . Smokeless tobacco: Never Used  Vaping Use  . Vaping Use: Never used  Substance and Sexual Activity  . Alcohol use: No    Comment: quit around 2010; no h/o heavy use  . Drug use: No  . Sexual activity: Not on file

## 2021-04-06 ENCOUNTER — Other Ambulatory Visit: Payer: Self-pay | Admitting: Gastroenterology

## 2021-04-10 ENCOUNTER — Ambulatory Visit (INDEPENDENT_AMBULATORY_CARE_PROVIDER_SITE_OTHER): Payer: Medicare Other

## 2021-04-10 ENCOUNTER — Ambulatory Visit (INDEPENDENT_AMBULATORY_CARE_PROVIDER_SITE_OTHER): Payer: Medicare Other | Admitting: Orthopedic Surgery

## 2021-04-10 ENCOUNTER — Encounter: Payer: Self-pay | Admitting: Orthopedic Surgery

## 2021-04-10 DIAGNOSIS — S82874S Nondisplaced pilon fracture of right tibia, sequela: Secondary | ICD-10-CM | POA: Diagnosis not present

## 2021-04-10 DIAGNOSIS — M25571 Pain in right ankle and joints of right foot: Secondary | ICD-10-CM

## 2021-04-10 DIAGNOSIS — M14671 Charcot's joint, right ankle and foot: Secondary | ICD-10-CM | POA: Diagnosis not present

## 2021-04-10 DIAGNOSIS — S82899K Other fracture of unspecified lower leg, subsequent encounter for closed fracture with nonunion: Secondary | ICD-10-CM | POA: Diagnosis not present

## 2021-04-10 DIAGNOSIS — S92909K Unspecified fracture of unspecified foot, subsequent encounter for fracture with nonunion: Secondary | ICD-10-CM

## 2021-04-10 NOTE — Progress Notes (Signed)
Office Visit Note   Patient: Brian [name]           Date of Birth: 05-31-1961           MRN: [id number] Visit Date: 04/10/2021              Requested by: [name], MD [address] [place],  [place] [address] PCP: [name], MD  Chief Complaint  Patient presents with   Right Ankle - Follow-up    DOI 11/02/20 Pilon fx 02/27/21 began bone stimulator       HPI: Patient is a 60 year old gentleman end-stage renal disease peripheral vascular disease diabetes with a Charcot fracture through the right ankle.  Patient has elected to continue with limb salvage intervention with immobilization and a bone stimulator.  Assessment & Plan: Visit Diagnoses:  1. Closed nondisplaced pilon fracture of right tibia, sequela   2. Charcot ankle, right   3. 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  Repeat radiographs 3 view of the left ankle at follow-up.  Discussed that if we still have a unstable Charcot pilon fracture the best option would be to proceed with a transtibial amputation with discharge to skilled nursing.  Follow-Up Instructions: Return in about 4 weeks (around 05/08/2021).   Ortho Exam  Patient is alert, oriented, no adenopathy, well-dressed, normal affect, normal respiratory effort. Examination patient has a palpable pulse the ankle pilon fracture is still unstable varus and valgus flexion and extension.  The foot is plantigrade there is no cellulitis no open ulcers no varus or valgus malalignment but patient still has an unstable fibrous union.  Imaging: XR Ankle Complete Right  Result Date: 04/10/2021 Three-view radiographs of the right ankle shows arterial calcification with a congruent tibiotalar joint and increased calcification of the pilon fracture  No images are attached to the encounter.  Labs: Lab Results  Component Value Date   HGBA1C 6.5 (H)  03/26/2020   HGBA1C 6.0 (H) 01/22/2018   HGBA1C 5.8 (H) 02/05/2017   ESRSEDRATE 40 (H) 03/26/2020   ESRSEDRATE 18 (H) 09/12/2010   CRP 3.0 (H) 04/02/2020   CRP 4.2 (H) 04/01/2020   CRP 6.5 (H) 03/31/2020   REPTSTATUS 04/01/2020 FINAL 03/27/2020   GRAMSTAIN  03/27/2020    RARE WBC PRESENT,BOTH PMN AND MONONUCLEAR FEW GRAM NEGATIVE COCCOBACILLI    CULT  03/27/2020    FEW GEMELLA MORBILLORUM Standardized susceptibility testing for this organism is not available. NO ANAEROBES ISOLATED Performed at [facility], [place] [address], [place], [place] [address]      Lab Results  Component Value Date   ALBUMIN 3.1 (L) 01/30/2021   ALBUMIN 3.0 (L) 03/26/2020   ALBUMIN 3.2 (L) 01/24/2018   PREALBUMIN 6.8 (L) 03/26/2020    Lab Results  Component Value Date   MG 2.0 04/02/2020   MG 1.9 04/01/2020   No results found for: [facility]  Lab Results  Component Value Date   PREALBUMIN 6.8 (L) 03/26/2020   CBC EXTENDED Latest Ref Rng & Units 01/30/2021 04/02/2020 04/01/2020  WBC 4.0 - 10.5 K/uL 8.7 12.6(H) 13.1(H)  RBC 4.22 - 5.81 Mil/uL 3.29(L) 3.40(L) 3.58(L)  HGB 13.0 - 17.0 g/dL 11.3(L) 11.3(L) 12.1(L)  HCT 39.0 - 52.0 % 33.4(L) 35.1(L) 37.1(L)  PLT 150.0 - 400.0 K/uL 200.0 324 327  NEUTROABS 1.4 - 7.7 K/uL 4.2 5.3 7.4  LYMPHSABS  0.7 - 4.0 K/uL 2.7 4.5(H) 3.4     There is no height or weight on file to calculate BMI.  Orders:  Orders Placed This Encounter  Procedures   XR Ankle Complete Right   No orders of the defined types were placed in this encounter.    Procedures: No procedures performed  Clinical Data: No additional findings.  ROS:  All other systems negative, except as noted in the HPI. Review of Systems  Objective: Vital Signs: There were no vitals taken for this visit.  Specialty Comments:  No specialty comments available.  PMFS History: Patient Active Problem List   Diagnosis Date Noted   Personal history of diabetic foot ulcer 04/11/2020    Diabetic infection of right foot ([place]) 03/26/2020   Headache, unspecified 09/10/2019   Diarrhea, unspecified 08/16/2019   Encounter for removal of sutures 07/28/2018   Cryptogenic cirrhosis ([place]) 07/16/2018   Encounter for immunization 07/01/2018   Gastritis 01/23/2018   Acute blood loss anemia 01/23/2018   Duodenal ulcer    Upper GI bleed 01/22/2018   History of CVA (cerebrovascular accident) 01/15/2018   Iron deficiency anemia, unspecified 11/25/2017   Coagulation defect, unspecified ([place]) 11/18/2017   Unspecified protein-calorie malnutrition ([place]) 11/11/2017   Acquired absence of spleen 10/30/2017   Nicotine dependence, unspecified, uncomplicated 82/99/3716   Other disorders of phosphorus metabolism 10/30/2017   Unspecified nephritic syndrome with unspecified morphologic changes 10/30/2017   Anemia in chronic kidney disease 10/24/2017   Dependence on renal dialysis ([place]) 10/24/2017   Encounter for screening for respiratory tuberculosis 10/24/2017   Pruritus, unspecified 10/24/2017   Secondary hyperparathyroidism of renal origin ([place]) 10/24/2017   Shortness of breath 10/24/2017   Type 1 diabetes mellitus with kidney complication ([place]) - on insulin 05/01/2017   Hemorrhagic stroke ([place])- Jan 2010 05/01/2017   H/O hyperlipidemia- sev yrs ago 05/01/2017   h/o Hypertriglyceridemia- 13000's in past 05/01/2017   H/O splenectomy 05/01/2017   Status post cholecystectomy 05/01/2017   Gout- L foot- mult times 05/01/2017   Diabetic neuropathy ([place])- 6 mo or so; never started meds 05/01/2017   Vitamin D deficiency 05/01/2017   ESRD on dialysis ([place]) 02/05/2017   Anasarca- may 2018 hosp 02/05/2017   Leukocytosis- s/p plenectomy 02/05/2017   Hypertension    Diabetes mellitus without complication (HCC)    Tobacco abuse- >er than 55 pack yr hx; current smoker    Past Medical History:  Diagnosis Date   Arthritis    Bleeding ulcer    Cirrhosis ([place])    Complication of anesthesia    " I  had a nose bleed a day or two after surgery-it could have been the oxygen"   Diabetes mellitus without complication (HCC)    Diabetic neuropathy (HCC)    Elevated TSH    ESRD on dialysis ([place])    MWF   History of kidney stones    Hypertension    Stroke ([place])    hemorrhagic 2010   Tobacco abuse     Family History  Problem Relation Age of Onset   Lymphoma Mother    Hypertension Father    Diabetes Mellitus II Father    Hypertension Brother    Diabetes Mellitus I Brother    Hypertension Brother    Colon cancer Neg Hx    Stomach cancer Neg Hx    Colon polyps Neg Hx    Esophageal cancer Neg Hx    Rectal cancer Neg Hx     Past Surgical History:  Procedure Laterality  Date   A/V FISTULAGRAM N/A 02/05/2018   Procedure: A/V FISTULAGRAM;  Surgeon: Conrad Yale, MD;  Location: [facility];  Service: Cardiovascular;  Laterality: N/A;   AMPUTATION Right 03/27/2020   Procedure: AMPUTATION RAY 5th;  Surgeon: Trula Slade, DPM;  Location: [facility];  Service: Podiatry;  Laterality: Right;   AV FISTULA PLACEMENT Left 05/16/2017   Procedure: ARTERIOVENOUS (AV) FISTULA CREATION-LEFT;  Surgeon: Angelia Mould, MD;  Location: [facility];  Service: Vascular;  Laterality: Left;   CHOLECYSTECTOMY     COLONOSCOPY     ESOPHAGOGASTRODUODENOSCOPY N/A 01/23/2018   Procedure: ESOPHAGOGASTRODUODENOSCOPY (EGD);  Surgeon: Yetta Flock, MD;  Location: [facility];  Service: Gastroenterology;  Laterality: N/A;   PANCREAS SURGERY     PERIPHERAL VASCULAR BALLOON ANGIOPLASTY  02/05/2018   Procedure: PERIPHERAL VASCULAR BALLOON ANGIOPLASTY;  Surgeon: Conrad Hicksville, MD;  Location: [facility];  Service: Cardiovascular;;  left AV fistula   SPLENECTOMY     WOUND DEBRIDEMENT Right 03/30/2020   Procedure: DEBRIDEMENT WOUND RIGHT FOOT;  Surgeon: Trula Slade, DPM;  Location: [facility];  Service: Podiatry;  Laterality: Right;   Social History   Occupational History   Occupation: disability   Tobacco Use   Smoking status: Every Day    Packs/day: 0.50    Years: 40.00    Pack years: 20.00    Types: Cigarettes   Smokeless tobacco: Never  Vaping Use   Vaping Use: Never used  Substance and Sexual Activity   Alcohol use: No    Comment: quit around 2010; no h/o heavy use   Drug use: No   Sexual activity: Not on file

## 2021-04-16 ENCOUNTER — Telehealth: Payer: Self-pay | Admitting: Orthopedic Surgery

## 2021-04-16 ENCOUNTER — Other Ambulatory Visit: Payer: Self-pay | Admitting: Physician Assistant

## 2021-04-16 ENCOUNTER — Encounter: Payer: Self-pay | Admitting: Certified Registered Nurse Anesthetist

## 2021-04-16 NOTE — Telephone Encounter (Signed)
Pt called to check on rx request and I made him aware of the message below and the pt states he was prescribed hydrocodone in January when he broke his ankle. Pt states if Audrea Muscat isn't able to call in the hydrocodone he would like anything she can call in to help with the pain.   [phone]

## 2021-04-16 NOTE — Telephone Encounter (Signed)
Please see below and advise.

## 2021-04-16 NOTE — Telephone Encounter (Signed)
Please advise. Patient requests pain medication as well as medication for nausea.

## 2021-04-16 NOTE — Telephone Encounter (Signed)
Can you please advise? Do you still want me to make patient an appointment? He last saw Dr. Sharol Given 04/10/2021. Thanks.

## 2021-04-16 NOTE — Telephone Encounter (Signed)
Spoke  with patient.  He has not needed pain medication since January.  He said he has been on his ankle a lot because his brother was in town and now it is hurting.  I asked him to let the ankle settle and elevate and take Tylenol and see how he does the next few days if he continues to have pain we would need to see him prior to restarting him on hydrocodone

## 2021-04-16 NOTE — Telephone Encounter (Signed)
I cant see that he has been on hydrocodone recently unless he gets it from out side [facility]. Would need to be seen before an rx

## 2021-04-16 NOTE — Telephone Encounter (Signed)
Pt called requesting hydrocodone pain medication and medication to stop nauseas. Please send to pharmacy on file. Please call patient when meds have been called in at [phone].

## 2021-04-16 NOTE — Telephone Encounter (Signed)
Brian [name] calling for pt asking to speak to the nurse or PA about the prescription. Advised of below message that pt just spoke with the PA and was given those instructions but she kept asking "if he was just seen last week, how come he can not have a hydrocodone prescription". The best call back number stated is [phone].

## 2021-04-17 ENCOUNTER — Other Ambulatory Visit: Payer: Self-pay

## 2021-04-17 ENCOUNTER — Encounter: Payer: Self-pay | Admitting: Gastroenterology

## 2021-04-17 ENCOUNTER — Ambulatory Visit (AMBULATORY_SURGERY_CENTER): Payer: Medicare Other | Admitting: Gastroenterology

## 2021-04-17 VITALS — BP 158/92 | HR 63 | Temp 99.3°F | Resp 19 | Ht 70.0 in | Wt 180.0 lb

## 2021-04-17 DIAGNOSIS — K2970 Gastritis, unspecified, without bleeding: Secondary | ICD-10-CM | POA: Diagnosis not present

## 2021-04-17 DIAGNOSIS — K766 Portal hypertension: Secondary | ICD-10-CM

## 2021-04-17 DIAGNOSIS — D122 Benign neoplasm of ascending colon: Secondary | ICD-10-CM | POA: Diagnosis not present

## 2021-04-17 DIAGNOSIS — K7460 Unspecified cirrhosis of liver: Secondary | ICD-10-CM | POA: Diagnosis not present

## 2021-04-17 DIAGNOSIS — D124 Benign neoplasm of descending colon: Secondary | ICD-10-CM

## 2021-04-17 DIAGNOSIS — I8500 Esophageal varices without bleeding: Secondary | ICD-10-CM | POA: Diagnosis not present

## 2021-04-17 DIAGNOSIS — D123 Benign neoplasm of transverse colon: Secondary | ICD-10-CM | POA: Diagnosis not present

## 2021-04-17 DIAGNOSIS — Z86010 Personal history of colonic polyps: Secondary | ICD-10-CM | POA: Diagnosis not present

## 2021-04-17 MED ORDER — SODIUM CHLORIDE 0.9 % IV SOLN: 500.0000 mL | Freq: Once | INTRAVENOUS | Status: DC

## 2021-04-17 NOTE — Progress Notes (Signed)
BG check requested by CRNA, result is 90.

## 2021-04-17 NOTE — Progress Notes (Signed)
941 late entry. Robinul 0.1 mg IV given due large amount of secretions upon assessment.  MD made aware, vss

## 2021-04-17 NOTE — Op Note (Signed)
[facility] Patient Name: Brian [name] Procedure Date: 04/17/2021 9:34 AM MRN: [id number] Endoscopist: Remo Lipps P. Havery Moros , MD Age: 60 Referring MD:  Date of Birth: 06/13/1961 Gender: Male Account #: [id number] Procedure:                Colonoscopy Indications:              High risk colon cancer surveillance: Personal                            history of colonic polyps (02/2018 - 5 adenomas) Medicines:                Monitored Anesthesia Care Procedure:                Pre-Anesthesia Assessment:                           - Prior to the procedure, a History and Physical                            was performed, and patient medications and                            allergies were reviewed. The patient's tolerance of                            previous anesthesia was also reviewed. The risks                            and benefits of the procedure and the sedation                            options and risks were discussed with the patient.                            All questions were answered, and informed consent                            was obtained. Prior Anticoagulants: The patient has                            taken no previous anticoagulant or antiplatelet                            agents. ASA Grade Assessment: III - A patient with                            severe systemic disease. After reviewing the risks                            and benefits, the patient was deemed in                            satisfactory condition to undergo the procedure.  After obtaining informed consent, the colonoscope                            was passed under direct vision. Throughout the                            procedure, the patient's blood pressure, pulse, and                            oxygen saturations were monitored continuously. The                            CF [id number] #[id number] was introduced through the anus                            and  advanced to the the terminal ileum, with                            identification of the appendiceal orifice and IC                            valve. The colonoscopy was performed without                            difficulty. The patient tolerated the procedure                            well. The quality of the bowel preparation was                            good. The terminal ileum, ileocecal valve,                            appendiceal orifice, and rectum were photographed. Scope In: 9:54:06 AM Scope Out: 10:11:46 AM Scope Withdrawal Time: 0 hours 14 minutes 35 seconds  Total Procedure Duration: 0 hours 17 minutes 40 seconds  Findings:                 Hemorrhoids were found on perianal exam.                           The terminal ileum appeared normal.                           A 4 mm polyp was found in the ascending colon. The                            polyp was sessile. The polyp was removed with a                            cold snare. Resection and retrieval were complete.                           Three sessile polyps were found in  the transverse                            colon. The polyps were 3 to 4 mm in size. These                            polyps were removed with a cold snare. Resection                            and retrieval were complete.                           A 4 mm polyp was found in the descending colon. The                            polyp was sessile. The polyp was removed with a                            cold snare. Resection and retrieval were complete.                           Congested mucosa was found in the entire colon,                            likely related to portal hypertension.                           Internal hemorrhoids were found during retroflexion.                           The exam was otherwise without abnormality. Complications:            No immediate complications. Estimated blood loss:                             Minimal. Estimated Blood Loss:     Estimated blood loss was minimal. Impression:               - Hemorrhoids found on perianal exam.                           - The examined portion of the ileum was normal.                           - One 4 mm polyp in the ascending colon, removed                            with a cold snare. Resected and retrieved.                           - Three 3 to 4 mm polyps in the transverse colon,                            removed with a cold snare. Resected and retrieved.                           -  One 4 mm polyp in the descending colon, removed                            with a cold snare. Resected and retrieved.                           - Congested mucosa in the entire examined colondue                            to portal hypertension.                           - Internal hemorrhoids.                           - The examination was otherwise normal. Recommendation:           - Patient has a contact number available for                            emergencies. The signs and symptoms of potential                            delayed complications were discussed with the                            patient. Return to normal activities tomorrow.                            Written discharge instructions were provided to the                            patient.                           - Resume previous diet.                           - Continue present medications.                           - Await pathology results. Remo Lipps P. Paden Senger, MD 04/17/2021 10:18:19 AM This report has been signed electronically.

## 2021-04-17 NOTE — Progress Notes (Signed)
Medical history reviewed with no changes noted. VS assessed by [place]. BG noted 66. Dextrose/IVF KVO.

## 2021-04-17 NOTE — Patient Instructions (Signed)
YOU HAD AN ENDOSCOPIC PROCEDURE TODAY AT THE [facility]:   Refer to the procedure report that was given to you for any specific questions about what was found during the examination.  If the procedure report does not answer your questions, please call your gastroenterologist to clarify.  If you requested that your care partner not be given the details of your procedure findings, then the procedure report has been included in a sealed envelope for you to review at your convenience later.  YOU SHOULD EXPECT: Some feelings of bloating in the abdomen. Passage of more gas than usual.  Walking can help get rid of the air that was put into your GI tract during the procedure and reduce the bloating. If you had a lower endoscopy (such as a colonoscopy or flexible sigmoidoscopy) you may notice spotting of blood in your stool or on the toilet paper. If you underwent a bowel prep for your procedure, you may not have a normal bowel movement for a few days.  Please Note:  You might notice some irritation and congestion in your nose or some drainage.  This is from the oxygen used during your procedure.  There is no need for concern and it should clear up in a day or so.  SYMPTOMS TO REPORT IMMEDIATELY:   Following lower endoscopy (colonoscopy or flexible sigmoidoscopy):  Excessive amounts of blood in the stool  Significant tenderness or worsening of abdominal pains  Swelling of the abdomen that is new, acute  Fever of 100F or higher   Following upper endoscopy (EGD)  Vomiting of blood or coffee ground material  New chest pain or pain under the shoulder blades  Painful or persistently difficult swallowing  New shortness of breath  Fever of 100F or higher  Black, tarry-looking stools  For urgent or emergent issues, a gastroenterologist can be reached at any hour by calling [phone]. Do not use MyChart messaging for urgent concerns.    DIET:  We do recommend a small meal at first, but  then you may proceed to your regular diet.  Drink plenty of fluids but you should avoid alcoholic beverages for 24 hours.  ACTIVITY:  You should plan to take it easy for the rest of today and you should NOT DRIVE or use heavy machinery until tomorrow (because of the sedation medicines used during the test).    FOLLOW UP: Our staff will call the number listed on your records 48-72 hours following your procedure to check on you and address any questions or concerns that you may have regarding the information given to you following your procedure. If we do not reach you, we will leave a message.  We will attempt to reach you two times.  During this call, we will ask if you have developed any symptoms of COVID 19. If you develop any symptoms (ie: fever, flu-like symptoms, shortness of breath, cough etc.) before then, please call [phone].  If you test positive for Covid 19 in the 2 weeks post procedure, please call and report this information to us.    If any biopsies were taken you will be contacted by phone or by letter within the next 1-3 weeks.  Please call us at [phone] if you have not heard about the biopsies in 3 weeks.    SIGNATURES/CONFIDENTIALITY: You and/or your care partner have signed paperwork which will be entered into your electronic medical record.  These signatures attest to the fact that that the information above on   your After Visit Summary has been reviewed and is understood.  Full responsibility of the confidentiality of this discharge information lies with you and/or your care-partner. 

## 2021-04-17 NOTE — Progress Notes (Signed)
Called to room to assist during endoscopic procedure.  Patient ID and intended procedure confirmed with present staff. Received instructions for my participation in the procedure from the performing physician.  

## 2021-04-17 NOTE — Progress Notes (Signed)
Report given to PACU, vss 

## 2021-04-17 NOTE — Op Note (Signed)
[place] Patient Name: Brian [name] Procedure Date: 04/17/2021 9:34 AM MRN: [id number] Endoscopist: Remo Lipps P. Havery Moros , MD Age: 60 Referring MD:  Date of Birth: 1961/02/04 Gender: Male Account #: [id number] Procedure:                Upper GI endoscopy Indications:              cirrhosis - history of small esophageal varices Medicines:                Monitored Anesthesia Care Procedure:                Pre-Anesthesia Assessment:                           - Prior to the procedure, a History and Physical                            was performed, and patient medications and                            allergies were reviewed. The patient's tolerance of                            previous anesthesia was also reviewed. The risks                            and benefits of the procedure and the sedation                            options and risks were discussed with the patient.                            All questions were answered, and informed consent                            was obtained. Prior Anticoagulants: The patient has                            taken no previous anticoagulant or antiplatelet                            agents. ASA Grade Assessment: III - A patient with                            severe systemic disease. After reviewing the risks                            and benefits, the patient was deemed in                            satisfactory condition to undergo the procedure.                           After obtaining informed consent, the endoscope was  passed under direct vision. Throughout the                            procedure, the patient's blood pressure, pulse, and                            oxygen saturations were monitored continuously. The                            GIF [id number] #[id number] was introduced through the                            mouth, and advanced to the second part of duodenum.                            The  upper GI endoscopy was accomplished without                            difficulty. The patient tolerated the procedure                            well. Scope In: Scope Out: Findings:                 A 2 cm hiatal hernia was present.                           The Z-line was slightly irregular but did not meet                            criteria for Barrett's biopsies and similar in                            appearance to prior exam.                           Trace to small varices without any stigmata for                            bleeding were found in the lower third of the                            esophagus.                           The exam of the esophagus was otherwise normal.                           Portal hypertensive gastropathy was found in the                            entire examined stomach - this was moderate to                            severe in the gastric body and  antrum with friable                            mucosa with adherent heme - biopsies last year for                            H pylori negative, no further biopsies taken today.                           The exam of the stomach was otherwise normal. No                            obvious gastric varices noted.                           The duodenal bulb and second portion of the                            duodenum were normal. Complications:            No immediate complications. Estimated blood loss:                            Minimal. Estimated Blood Loss:     Estimated blood loss was minimal. Impression:               - 2 cm hiatal hernia.                           - Z-line slightly irregular.                           - Trace to small esophageal varices not                            significantly changed since last exam one year ago.                           - Severe portal hypertensive gastropathy.                           - Normal duodenal bulb and second portion of the                             duodenum. Recommendation:           - Patient has a contact number available for                            emergencies. The signs and symptoms of potential                            delayed complications were discussed with the                            patient. Return to normal activities tomorrow.  Written discharge instructions were provided to the                            patient.                           - Resume previous diet.                           - Continue present medications.                           - Given severity of portal hypertensive gastritis,                            would benefit from nonselective beta blockade if                            otherwise no contraindications. He is currently on                            metoprolol, will speak with his primary physician                            about switching to Nadolol or Coreg. If not, then                            would repeat upper endoscopy in 1 year for                            surveillance of varices. Remo Lipps P. Havery Moros, MD 04/17/2021 10:25:22 AM This report has been signed electronically.

## 2021-04-19 ENCOUNTER — Telehealth: Payer: Self-pay

## 2021-04-19 NOTE — Telephone Encounter (Signed)
  Follow up Call-  Call back number 04/17/2021 03/07/2020  Post procedure Call Back phone  # [phone]  Permission to leave phone message Yes Yes  Some recent data might be hidden     Patient questions:  Do you have a fever, pain , or abdominal swelling? No. Pain Score  0 *  Have you tolerated food without any problems? Yes.    Have you been able to return to your normal activities? Yes.    Do you have any questions about your discharge instructions: Diet   No. Medications  No. Follow up visit  No.  Do you have questions or concerns about your Care? No.  Actions: * If pain score is 4 or above: No action needed, pain <4.  Have you developed a fever since your procedure? no  2.   Have you had an respiratory symptoms (SOB or cough) since your procedure? no  3.   Have you tested positive for COVID 19 since your procedure no  4.   Have you had any family members/close contacts diagnosed with the COVID 19 since your procedure?  no   If yes to any of these questions please route to Joylene John, RN and Joella Prince, RN

## 2021-04-24 ENCOUNTER — Telehealth: Payer: Self-pay

## 2021-04-24 NOTE — Telephone Encounter (Signed)
-----   Message from Yetta Flock, MD sent at 04/24/2021  4:48 PM EDT ----- Regarding: med change Jan,  I am trying to reach out to this patient's primary care physician to see if it would be okay if we switched his metoprolol to nadolol 40 mg a day in light of his portal hypertension and esophageal varices.  Can you touch base with his PCP's office if possible and ask them to leave a message if this is okay?  Thanks very much for your help, I am not able to directly message his primary care, they are not in Epic.  Dr. Loni Muse

## 2021-04-24 NOTE — Telephone Encounter (Signed)
Called Dr. Ranee Gosselin office at [phone]. They close at 4:30 pm.

## 2021-04-24 NOTE — Telephone Encounter (Signed)
Spoke to patient to verify who his PCP is and who manages his metroprolol. He indicates that Dr. Arelia Sneddon is his PCP but he rarely sees him.  His doctor at [facility], Dr. Hollie Salk, sent the script for him last time. Who would you like for me to reach out to regarding the request to switch to nadolol?

## 2021-04-25 NOTE — Telephone Encounter (Signed)
Yes that would be great, thanks Jan

## 2021-04-25 NOTE — Telephone Encounter (Signed)
Called Dr. Hollie Salk at [facility] at [phone]. Her CMA is Caryl Pina. I left a detailed message "to see if it would be okay if we switched his metoprolol to nadolol 40 mg a day in light of his portal hypertension and esophageal varices". Asked for Dr. Hollie Salk to weigh in or call Dr. Havery Moros to discuss.

## 2021-04-26 NOTE — Telephone Encounter (Signed)
Called Dr. Bishop Dublin office. Was told I need to call the dialysis center at [phone] since the patient is on dialysis. Called and was told they have no way to get a hold of Dr. Hollie Salk and she is not really his provider since he is on dialysis he is seen by all the doctors.. However, Her pager number is [phone].  Called back to the office and left a message with [place]. She will try to get the message to dr Hollie Salk. But was not sure when we may get a reply Dr. Loni Muse - you may want to page Dr. Hollie Salk:  [phone] to expedite a reply.

## 2021-04-30 NOTE — Telephone Encounter (Signed)
Jan thank you for looking into this. I spoke with Dr. Hollie Salk today and she had no problems about switching the metoprolol to nadolol 40mg  /day, thought he would do okay with it. Can you let the patient know and place the order for nadolol, and he will need to stop metoprolol when doing this. Can give #[phone]. Thank you

## 2021-05-01 ENCOUNTER — Telehealth: Payer: Self-pay

## 2021-05-01 MED ORDER — NADOLOL 40 MG PO TABS: 40.0000 mg | ORAL_TABLET | Freq: Every day | ORAL | 3 refills | Status: AC

## 2021-05-01 NOTE — Telephone Encounter (Signed)
Called and spoke to patient. He understands to discontinue metroprolol and start Nadolol. Script sent to pharmacy.  Patient inquired about the results of his recent procedures. He had not seen Dr. Doyne Keel MyChart message. We reviewed the message. Patient expressed understanding.

## 2021-05-01 NOTE — Telephone Encounter (Signed)
PA for Nadolol 40 mg approved through 10-06-2021. Member ID: [id number].  [place] review [phone]

## 2021-05-01 NOTE — Telephone Encounter (Signed)
Script for Nadolol 40mg  qd sent to pharmacy.  D/C Metoprolol.

## 2021-05-01 NOTE — Telephone Encounter (Signed)
PA submitted via CoverMyMeds for nadolol 40 mg once daily for portal hypertension, esophageal varices, cirrhosis

## 2021-05-01 NOTE — Telephone Encounter (Signed)
Pt calling to inform pharmacy is requesting prior author for med Nanolol.. Plz advise  thanks

## 2021-05-08 ENCOUNTER — Ambulatory Visit (INDEPENDENT_AMBULATORY_CARE_PROVIDER_SITE_OTHER): Payer: Medicare Other

## 2021-05-08 ENCOUNTER — Ambulatory Visit (INDEPENDENT_AMBULATORY_CARE_PROVIDER_SITE_OTHER): Payer: Medicare Other | Admitting: Orthopedic Surgery

## 2021-05-08 ENCOUNTER — Encounter: Payer: Self-pay | Admitting: Orthopedic Surgery

## 2021-05-08 DIAGNOSIS — M25571 Pain in right ankle and joints of right foot: Secondary | ICD-10-CM | POA: Diagnosis not present

## 2021-05-08 DIAGNOSIS — M14671 Charcot's joint, right ankle and foot: Secondary | ICD-10-CM | POA: Diagnosis not present

## 2021-05-08 DIAGNOSIS — S82874S Nondisplaced pilon fracture of right tibia, sequela: Secondary | ICD-10-CM

## 2021-05-08 MED ORDER — HYDROCODONE-ACETAMINOPHEN 5-325 MG PO TABS: 1.0000 | ORAL_TABLET | Freq: Four times a day (QID) | ORAL | 0 refills | Status: DC | PRN

## 2021-05-08 NOTE — Progress Notes (Signed)
Office Visit Note   Patient: Brian [name]           Date of Birth: 1960/10/23           MRN: [id number] Visit Date: 05/08/2021              Requested by: [name], MD [address] [place],  [place] [address] PCP: [name], MD  Chief Complaint  Patient presents with   Right Ankle - Follow-up      HPI: Patient is a 60 year old gentleman with diabetic Charcot collapse through the right ankle pilon fracture.  Patient has been immobilized in a fracture boot he uses a walker at home.  Assessment & Plan: Visit Diagnoses:  1. Closed nondisplaced pilon fracture of right tibia, sequela   2. Charcot ankle, right     Plan: Continue the fracture boot and weightbearing as tolerated repeat radiographs at follow-up in 4 weeks.  Follow-Up Instructions: No follow-ups on file.   Ortho Exam  Patient is alert, oriented, no adenopathy, well-dressed, normal affect, normal respiratory effort. Examination patient's foot is plantigrade there are no ulcers no calluses.  The distal aspect of the pilon fracture is well-healed without instability more proximally there is still motion at the fibrous union site.  There is no cellulitis no ulcers no signs of infection there is no warmth no evidence of active Charcot process.  Imaging: XR Ankle Complete Right  Result Date: 05/08/2021 Three-view roughing right ankle shows good consolidation of the distal fractures no loss of alignment there is still persistent nonunion of the most proximal fracture.  No images are attached to the encounter.  Labs: Lab Results  Component Value Date   HGBA1C 6.5 (H) 03/26/2020   HGBA1C 6.0 (H) 01/22/2018   HGBA1C 5.8 (H) 02/05/2017   ESRSEDRATE 40 (H) 03/26/2020   ESRSEDRATE 18 (H) 09/12/2010   CRP 3.0 (H) 04/02/2020   CRP 4.2 (H) 04/01/2020   CRP 6.5 (H) 03/31/2020   REPTSTATUS 04/01/2020 FINAL 03/27/2020   GRAMSTAIN  03/27/2020    RARE WBC PRESENT,BOTH PMN AND MONONUCLEAR FEW GRAM  NEGATIVE COCCOBACILLI    CULT  03/27/2020    FEW GEMELLA MORBILLORUM Standardized susceptibility testing for this organism is not available. NO ANAEROBES ISOLATED Performed at [facility], [place] [address], [place], [place] [address]      Lab Results  Component Value Date   ALBUMIN 3.1 (L) 01/30/2021   ALBUMIN 3.0 (L) 03/26/2020   ALBUMIN 3.2 (L) 01/24/2018   PREALBUMIN 6.8 (L) 03/26/2020    Lab Results  Component Value Date   MG 2.0 04/02/2020   MG 1.9 04/01/2020   No results found for: [facility]  Lab Results  Component Value Date   PREALBUMIN 6.8 (L) 03/26/2020   CBC EXTENDED Latest Ref Rng & Units 01/30/2021 04/02/2020 04/01/2020  WBC 4.0 - 10.5 K/uL 8.7 12.6(H) 13.1(H)  RBC 4.22 - 5.81 Mil/uL 3.29(L) 3.40(L) 3.58(L)  HGB 13.0 - 17.0 g/dL 11.3(L) 11.3(L) 12.1(L)  HCT 39.0 - 52.0 % 33.4(L) 35.1(L) 37.1(L)  PLT 150.0 - 400.0 K/uL 200.0 324 327  NEUTROABS 1.4 - 7.7 K/uL 4.2 5.3 7.4  LYMPHSABS 0.7 - 4.0 K/uL 2.7 4.5(H) 3.4     There is no height or weight on file to calculate BMI.  Orders:  Orders Placed This Encounter  Procedures   XR Ankle Complete Right   Meds ordered this encounter  Medications   HYDROcodone-acetaminophen (NORCO/VICODIN) 5-325 MG tablet    Sig:  Take 1 tablet by mouth every 6 (six) hours as needed for moderate pain.    Dispense:  30 tablet    Refill:  0     Procedures: No procedures performed  Clinical Data: No additional findings.  ROS:  All other systems negative, except as noted in the HPI. Review of Systems  Objective: Vital Signs: There were no vitals taken for this visit.  Specialty Comments:  No specialty comments available.  PMFS History: Patient Active Problem List   Diagnosis Date Noted   Personal history of diabetic foot ulcer 04/11/2020   Diabetic infection of right foot ([place]) 03/26/2020   Headache, unspecified 09/10/2019   Diarrhea, unspecified 08/16/2019   Encounter for removal of sutures 07/28/2018    Cryptogenic cirrhosis ([place]) 07/16/2018   Encounter for immunization 07/01/2018   Gastritis 01/23/2018   Acute blood loss anemia 01/23/2018   Duodenal ulcer    Upper GI bleed 01/22/2018   History of CVA (cerebrovascular accident) 01/15/2018   Iron deficiency anemia, unspecified 11/25/2017   Coagulation defect, unspecified ([place]) 11/18/2017   Unspecified protein-calorie malnutrition ([place]) 11/11/2017   Acquired absence of spleen 10/30/2017   Nicotine dependence, unspecified, uncomplicated 46/56/8127   Other disorders of phosphorus metabolism 10/30/2017   Unspecified nephritic syndrome with unspecified morphologic changes 10/30/2017   Anemia in chronic kidney disease 10/24/2017   Dependence on renal dialysis ([place]) 10/24/2017   Encounter for screening for respiratory tuberculosis 10/24/2017   Pruritus, unspecified 10/24/2017   Secondary hyperparathyroidism of renal origin ([place]) 10/24/2017   Shortness of breath 10/24/2017   Type 1 diabetes mellitus with kidney complication ([place]) - on insulin 05/01/2017   Hemorrhagic stroke ([place])- Jan 2010 05/01/2017   H/O hyperlipidemia- sev yrs ago 05/01/2017   h/o Hypertriglyceridemia- 13000's in past 05/01/2017   H/O splenectomy 05/01/2017   Status post cholecystectomy 05/01/2017   Gout- L foot- mult times 05/01/2017   Diabetic neuropathy ([place])- 6 mo or so; never started meds 05/01/2017   Vitamin D deficiency 05/01/2017   ESRD on dialysis ([place]) 02/05/2017   Anasarca- may 2018 hosp 02/05/2017   Leukocytosis- s/p plenectomy 02/05/2017   Hypertension    Diabetes mellitus without complication (HCC)    Tobacco abuse- >er than 55 pack yr hx; current smoker    Past Medical History:  Diagnosis Date   Allergy    Anemia    Arthritis    Bleeding ulcer    Cirrhosis ([place])    w/ esophageal varices   Complication of anesthesia    " I had a nose bleed a day or two after surgery-it could have been the oxygen"   Diabetes mellitus without complication (HCC)     Diabetic neuropathy (HCC)    Elevated TSH    ESRD on dialysis ([place])    MWF   History of kidney stones    Hypertension    Portal hypertension (HCC)    Stroke ([place])    hemorrhagic 2010   Tobacco abuse     Family History  Problem Relation Age of Onset   Lymphoma Mother    Hypertension Father    Diabetes Mellitus II Father    Hypertension Brother    Diabetes Mellitus I Brother    Hypertension Brother    Colon cancer Neg Hx    Stomach cancer Neg Hx    Colon polyps Neg Hx    Esophageal cancer Neg Hx    Rectal cancer Neg Hx     Past Surgical History:  Procedure Laterality Date  A/V FISTULAGRAM N/A 02/05/2018   Procedure: A/V FISTULAGRAM;  Surgeon: Conrad Cut and Shoot, MD;  Location: [facility];  Service: Cardiovascular;  Laterality: N/A;   AMPUTATION Right 03/27/2020   Procedure: AMPUTATION RAY 5th;  Surgeon: Trula Slade, DPM;  Location: [facility];  Service: Podiatry;  Laterality: Right;   AV FISTULA PLACEMENT Left 05/16/2017   Procedure: ARTERIOVENOUS (AV) FISTULA CREATION-LEFT;  Surgeon: Angelia Mould, MD;  Location: [facility] OR;  Service: Vascular;  Laterality: Left;   CHOLECYSTECTOMY     COLONOSCOPY     ESOPHAGOGASTRODUODENOSCOPY N/A 01/23/2018   Procedure: ESOPHAGOGASTRODUODENOSCOPY (EGD);  Surgeon: Yetta Flock, MD;  Location[facility] ENDOSCOPY;  Service: Gastroenterology;  Laterality: N/A;   PANCREAS SURGERY     PERIPHERAL VASCULAR BALLOON ANGIOPLASTY  02/05/2018   Procedure: PERIPHERAL VASCULAR BALLOON ANGIOPLASTY;  Surgeon: Conrad Adams, MD;  Location: [facility];  Service: Cardiovascular;;  left AV fistula   SPLENECTOMY     WOUND DEBRIDEMENT Right 03/30/2020   Procedure: DEBRIDEMENT WOUND RIGHT FOOT;  Surgeon: Trula Slade, DPM;  Location: [facility];  Service: Podiatry;  Laterality: Right;   Social History   Occupational History   Occupation: disability  Tobacco Use   Smoking status: Every Day    Packs/day: 0.50    Years: 40.00    Pack years:  20.00    Types: Cigarettes   Smokeless tobacco: Never  Vaping Use   Vaping Use: Never used  Substance and Sexual Activity   Alcohol use: No    Comment: quit around 2010; no h/o heavy use   Drug use: No   Sexual activity: Not on file

## 2021-06-05 ENCOUNTER — Ambulatory Visit (INDEPENDENT_AMBULATORY_CARE_PROVIDER_SITE_OTHER): Payer: Medicare Other

## 2021-06-05 ENCOUNTER — Encounter: Payer: Self-pay | Admitting: Orthopedic Surgery

## 2021-06-05 ENCOUNTER — Ambulatory Visit (INDEPENDENT_AMBULATORY_CARE_PROVIDER_SITE_OTHER): Payer: Medicare Other | Admitting: Orthopedic Surgery

## 2021-06-05 DIAGNOSIS — M25572 Pain in left ankle and joints of left foot: Secondary | ICD-10-CM

## 2021-06-05 DIAGNOSIS — S82874S Nondisplaced pilon fracture of right tibia, sequela: Secondary | ICD-10-CM

## 2021-06-05 DIAGNOSIS — M14671 Charcot's joint, right ankle and foot: Secondary | ICD-10-CM

## 2021-06-05 DIAGNOSIS — S82899K Other fracture of unspecified lower leg, subsequent encounter for closed fracture with nonunion: Secondary | ICD-10-CM | POA: Diagnosis not present

## 2021-06-05 DIAGNOSIS — S92909K Unspecified fracture of unspecified foot, subsequent encounter for fracture with nonunion: Secondary | ICD-10-CM | POA: Diagnosis not present

## 2021-06-05 NOTE — Progress Notes (Signed)
Office Visit Note   Patient: Brian [name]           Date of Birth: 10/19/1960           MRN: [id number] Visit Date: 06/05/2021              Requested by: [name], MD [address] [place],  [place] [address] PCP: [name], MD  Chief Complaint  Patient presents with   Right Leg - Follow-up      HPI: Patient is a 60 year old gentleman who is status post Charcot collapse of a pilon fracture right ankle.  Patient has been immobilized and has maintained the gross alignment of the ankle fracture however there is insufficient callus formation for healing.  Patient has significant peripheral vascular disease with calcification of the arteries to the ankle.  Assessment & Plan: Visit Diagnoses:  1. Closed nondisplaced pilon fracture of right tibia, sequela   2. Charcot ankle, right   3. 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  With the persistent nonunion the 2 options are to proceed with a tibial calcaneal fusion with intramedullary nail versus a transtibial amputation.  Discussed that with the fusion there is an increased risk of the incisions not healing and increased risk of the wound not healing and increased risk of below the knee amputation.  Patient will call with questions or to schedule surgery.  Patient is considering a tibial calcaneal fusion versus below-knee amputation.  Follow-Up Instructions: Return in about 3 weeks (around 06/26/2021).   Ortho Exam  Patient is alert, oriented, no adenopathy, well-dressed, normal affect, normal respiratory effort. Examination patient has a palpable dorsalis pedis pulse.  The Charcot fracture site still is unstable with insufficient stability from the callus formation.  There is no redness no cellulitis no open ulcers there is no gross malalignment.  No signs of infection.  Imaging: XR Ankle Complete  Right  Result Date: 06/05/2021 Three-view radiographs of the right ankle shows maintenance of the alignment of the Charcot ankle on both AP and lateral planes the tibial talar joint is congruent there is heterotopic ossification but insufficient callus bridging across the more proximal fracture fragments.  No images are attached to the encounter.  Labs: Lab Results  Component Value Date   HGBA1C 6.5 (H) 03/26/2020   HGBA1C 6.0 (H) 01/22/2018   HGBA1C 5.8 (H) 02/05/2017   ESRSEDRATE 40 (H) 03/26/2020   ESRSEDRATE 18 (H) 09/12/2010   CRP 3.0 (H) 04/02/2020   CRP 4.2 (H) 04/01/2020   CRP 6.5 (H) 03/31/2020   REPTSTATUS 04/01/2020 FINAL 03/27/2020   GRAMSTAIN  03/27/2020    RARE WBC PRESENT,BOTH PMN AND MONONUCLEAR FEW GRAM NEGATIVE COCCOBACILLI    CULT  03/27/2020    FEW GEMELLA MORBILLORUM Standardized susceptibility testing for this organism is not available. NO ANAEROBES ISOLATED Performed at [facility], [place] [address] [place],  [address]      Lab Results  Component Value Date   ALBUMIN 3.1 (L) 01/30/2021   ALBUMIN 3.0 (L) 03/26/2020   ALBUMIN 3.2 (L) 01/24/2018   PREALBUMIN 6.8 (L) 03/26/2020    Lab Results  Component Value Date   MG 2.0 04/02/2020   MG 1.9 04/01/2020   No results found for: [facility]  Lab Results  Component Value Date   PREALBUMIN 6.8 (L) 03/26/2020   CBC EXTENDED Latest Ref Rng &  Units 01/30/2021 04/02/2020 04/01/2020  WBC 4.0 - 10.5 K/uL 8.7 12.6(H) 13.1(H)  RBC 4.22 - 5.81 Mil/uL 3.29(L) 3.40(L) 3.58(L)  HGB 13.0 - 17.0 g/dL 11.3(L) 11.3(L) 12.1(L)  HCT 39.0 - 52.0 % 33.4(L) 35.1(L) 37.1(L)  PLT 150.0 - 400.0 K/uL 200.0 324 327  NEUTROABS 1.4 - 7.7 K/uL 4.2 5.3 7.4  LYMPHSABS 0.7 - 4.0 K/uL 2.7 4.5(H) 3.4     There is no height or weight on file to calculate BMI.  Orders:  Orders Placed This Encounter  Procedures   XR Ankle Complete Right   No orders of the defined types were placed in this encounter.     Procedures: No procedures performed  Clinical Data: No additional findings.  ROS:  All other systems negative, except as noted in the HPI. Review of Systems  Objective: Vital Signs: There were no vitals taken for this visit.  Specialty Comments:  No specialty comments available.  PMFS History: Patient Active Problem List   Diagnosis Date Noted   Personal history of diabetic foot ulcer 04/11/2020   Diabetic infection of right foot ([place]) 03/26/2020   Headache, unspecified 09/10/2019   Diarrhea, unspecified 08/16/2019   Encounter for removal of sutures 07/28/2018   Cryptogenic cirrhosis ([place]) 07/16/2018   Encounter for immunization 07/01/2018   Gastritis 01/23/2018   Acute blood loss anemia 01/23/2018   Duodenal ulcer    Upper GI bleed 01/22/2018   History of CVA (cerebrovascular accident) 01/15/2018   Iron deficiency anemia, unspecified 11/25/2017   Coagulation defect, unspecified ([place]) 11/18/2017   Unspecified protein-calorie malnutrition ([place]) 11/11/2017   Acquired absence of spleen 10/30/2017   Nicotine dependence, unspecified, uncomplicated 46/96/2952   Other disorders of phosphorus metabolism 10/30/2017   Unspecified nephritic syndrome with unspecified morphologic changes 10/30/2017   Anemia in chronic kidney disease 10/24/2017   Dependence on renal dialysis ([place]) 10/24/2017   Encounter for screening for respiratory tuberculosis 10/24/2017   Pruritus, unspecified 10/24/2017   Secondary hyperparathyroidism of renal origin ([place]) 10/24/2017   Shortness of breath 10/24/2017   Type 1 diabetes mellitus with kidney complication ([place]) - on insulin 05/01/2017   Hemorrhagic stroke ([place])- Jan 2010 05/01/2017   H/O hyperlipidemia- sev yrs ago 05/01/2017   h/o Hypertriglyceridemia- 13000's in past 05/01/2017   H/O splenectomy 05/01/2017   Status post cholecystectomy 05/01/2017   Gout- L foot- mult times 05/01/2017   Diabetic neuropathy ([place])- 6 mo or so; never started meds  05/01/2017   Vitamin D deficiency 05/01/2017   ESRD on dialysis ([place]) 02/05/2017   Anasarca- may 2018 hosp 02/05/2017   Leukocytosis- s/p plenectomy 02/05/2017   Hypertension    Diabetes mellitus without complication (HCC)    Tobacco abuse- >er than 55 pack yr hx; current smoker    Past Medical History:  Diagnosis Date   Allergy    Anemia    Arthritis    Bleeding ulcer    Cirrhosis ([place])    w/ esophageal varices   Complication of anesthesia    " I had a nose bleed a day or two after surgery-it could have been the oxygen"   Diabetes mellitus without complication (HCC)    Diabetic neuropathy (HCC)    Elevated TSH    ESRD on dialysis ([place])    MWF   History of kidney stones    Hypertension    Portal hypertension ([place])    Stroke ([place])    hemorrhagic 2010   Tobacco abuse     Family History  Problem Relation Age of  Onset   Lymphoma Mother    Hypertension Father    Diabetes Mellitus II Father    Hypertension Brother    Diabetes Mellitus I Brother    Hypertension Brother    Colon cancer Neg Hx    Stomach cancer Neg Hx    Colon polyps Neg Hx    Esophageal cancer Neg Hx    Rectal cancer Neg Hx     Past Surgical History:  Procedure Laterality Date   A/V FISTULAGRAM N/A 02/05/2018   Procedure: A/V FISTULAGRAM;  Surgeon: Conrad Dunedin, MD;  Location: [facility];  Service: Cardiovascular;  Laterality: N/A;   AMPUTATION Right 03/27/2020   Procedure: AMPUTATION RAY 5th;  Surgeon: Trula Slade, DPM;  Location: [facility];  Service: Podiatry;  Laterality: Right;   AV FISTULA PLACEMENT Left 05/16/2017   Procedure: ARTERIOVENOUS (AV) FISTULA CREATION-LEFT;  Surgeon: Angelia Mould, MD;  Location: [facility] OR;  Service: Vascular;  Laterality: Left;   CHOLECYSTECTOMY     COLONOSCOPY     ESOPHAGOGASTRODUODENOSCOPY N/A 01/23/2018   Procedure: ESOPHAGOGASTRODUODENOSCOPY (EGD);  Surgeon: Yetta Flock, MD;  Location: [facility];  Service: Gastroenterology;  Laterality:  N/A;   PANCREAS SURGERY     PERIPHERAL VASCULAR BALLOON ANGIOPLASTY  02/05/2018   Procedure: PERIPHERAL VASCULAR BALLOON ANGIOPLASTY;  Surgeon: Conrad Harlingen, MD;  Location: [facility];  Service: Cardiovascular;;  left AV fistula   SPLENECTOMY     WOUND DEBRIDEMENT Right 03/30/2020   Procedure: DEBRIDEMENT WOUND RIGHT FOOT;  Surgeon: Trula Slade, DPM;  Location: [facility];  Service: Podiatry;  Laterality: Right;   Social History   Occupational History   Occupation: disability  Tobacco Use   Smoking status: Every Day    Packs/day: 0.50    Years: 40.00    Pack years: 20.00    Types: Cigarettes   Smokeless tobacco: Never  Vaping Use   Vaping Use: Never used  Substance and Sexual Activity   Alcohol use: No    Comment: quit around 2010; no h/o heavy use   Drug use: No   Sexual activity: Not on file

## 2021-06-06 ENCOUNTER — Other Ambulatory Visit: Payer: Self-pay

## 2021-06-06 DIAGNOSIS — K7460 Unspecified cirrhosis of liver: Secondary | ICD-10-CM

## 2021-06-08 ENCOUNTER — Telehealth: Payer: Self-pay | Admitting: Gastroenterology

## 2021-06-08 NOTE — Telephone Encounter (Signed)
Patient has been given the number to radiology to reschedule his ultrasound to a date and time that works better for him. I advised he should make sure to have his calendar available to insure he is getting a date that works best for him. He verbalizes understanding.

## 2021-06-08 NOTE — Telephone Encounter (Signed)
Patient called to reschedule hospital appointment

## 2021-06-15 ENCOUNTER — Ambulatory Visit (HOSPITAL_COMMUNITY): Payer: Medicare Other

## 2021-06-19 ENCOUNTER — Other Ambulatory Visit (HOSPITAL_COMMUNITY): Payer: Medicare Other

## 2021-06-19 ENCOUNTER — Other Ambulatory Visit: Payer: Self-pay | Admitting: Gastroenterology

## 2021-06-21 ENCOUNTER — Ambulatory Visit (HOSPITAL_COMMUNITY)
Admission: RE | Admit: 2021-06-21 | Discharge: 2021-06-21 | Disposition: A | Discharge status: Home / Self Care | Payer: Medicare Other | Source: Ambulatory Visit | Attending: Gastroenterology | Admitting: Gastroenterology

## 2021-06-21 ENCOUNTER — Other Ambulatory Visit: Payer: Self-pay

## 2021-06-21 DIAGNOSIS — K7460 Unspecified cirrhosis of liver: Secondary | ICD-10-CM | POA: Insufficient documentation

## 2021-06-26 ENCOUNTER — Ambulatory Visit (INDEPENDENT_AMBULATORY_CARE_PROVIDER_SITE_OTHER): Payer: Medicare Other | Admitting: Orthopedic Surgery

## 2021-06-26 ENCOUNTER — Encounter: Payer: Self-pay | Admitting: Orthopedic Surgery

## 2021-06-26 DIAGNOSIS — Z992 Dependence on renal dialysis: Secondary | ICD-10-CM

## 2021-06-26 DIAGNOSIS — I739 Peripheral vascular disease, unspecified: Secondary | ICD-10-CM

## 2021-06-26 DIAGNOSIS — N186 End stage renal disease: Secondary | ICD-10-CM

## 2021-06-26 DIAGNOSIS — M79661 Pain in right lower leg: Secondary | ICD-10-CM | POA: Diagnosis not present

## 2021-06-26 DIAGNOSIS — S82874S Nondisplaced pilon fracture of right tibia, sequela: Secondary | ICD-10-CM

## 2021-06-26 NOTE — Progress Notes (Signed)
Office Visit Note   Patient: Brian [name]           Date of Birth: 06-09-1961           MRN: [id number] Visit Date: 06/26/2021              Requested by: [name], MD [address] [place],  [place] [address] PCP: [name], MD  Chief Complaint  Patient presents with   Right Leg - Fracture      HPI: Patient is a 60 year old gentleman who has had a nonunion of a pilon fracture on the right he still has instability ambulating in a wheelchair.  Patient is on dialysis with end-stage renal disease secondary to diabetes.  Assessment & Plan: Visit Diagnoses:  1. Closed nondisplaced pilon fracture of right tibia, sequela   2. PVD (peripheral vascular disease) ([place])   3. ESRD on dialysis [facility])     Plan: Discussed with the patient high likelihood of wound dehiscence with internal fixation discussed using plates and screws versus a intramedullary nail.  I feel the patient would have a better success rate and better function with an amputation.  We will plan for for patient to call us when he determines what treatment option he would prefer.  Follow-Up Instructions: Return if symptoms worsen or fail to improve.   Ortho Exam  Patient is alert, oriented, no adenopathy, well-dressed, normal affect, normal respiratory effort. Examination patient's pilon ankle fracture is unstable.  There is no skin breakdown there is no cellulitis.  I cannot palpate a good pulse.  Imaging: No results found. No images are attached to the encounter.  Labs: Lab Results  Component Value Date   HGBA1C 6.5 (H) 03/26/2020   HGBA1C 6.0 (H) 01/22/2018   HGBA1C 5.8 (H) 02/05/2017   ESRSEDRATE 40 (H) 03/26/2020   ESRSEDRATE 18 (H) 09/12/2010   CRP 3.0 (H) 04/02/2020   CRP 4.2 (H) 04/01/2020   CRP 6.5 (H) 03/31/2020   REPTSTATUS 04/01/2020 FINAL 03/27/2020   GRAMSTAIN  03/27/2020    RARE WBC PRESENT,BOTH PMN AND MONONUCLEAR FEW GRAM NEGATIVE COCCOBACILLI    CULT  03/27/2020     FEW GEMELLA MORBILLORUM Standardized susceptibility testing for this organism is not available. NO ANAEROBES ISOLATED Performed at [facility], [place] [address], [place],  [address]      Lab Results  Component Value Date   ALBUMIN 3.1 (L) 01/30/2021   ALBUMIN 3.0 (L) 03/26/2020   ALBUMIN 3.2 (L) 01/24/2018   PREALBUMIN 6.8 (L) 03/26/2020    Lab Results  Component Value Date   MG 2.0 04/02/2020   MG 1.9 04/01/2020   No results found for: [facility]  Lab Results  Component Value Date   PREALBUMIN 6.8 (L) 03/26/2020   CBC EXTENDED Latest Ref Rng & Units 01/30/2021 04/02/2020 04/01/2020  WBC 4.0 - 10.5 K/uL 8.7 12.6(H) 13.1(H)  RBC 4.22 - 5.81 Mil/uL 3.29(L) 3.40(L) 3.58(L)  HGB 13.0 - 17.0 g/dL 11.3(L) 11.3(L) 12.1(L)  HCT 39.0 - 52.0 % 33.4(L) 35.1(L) 37.1(L)  PLT 150.0 - 400.0 K/uL 200.0 324 327  NEUTROABS 1.4 - 7.7 K/uL 4.2 5.3 7.4  LYMPHSABS 0.7 - 4.0 K/uL 2.7 4.5(H) 3.4     There is no height or weight on file to calculate BMI.  Orders:  No orders of the defined types were placed in this encounter.  No orders of the defined types were placed in this encounter.    Procedures: No procedures performed  Clinical Data: No additional findings.  ROS:  All other systems negative, except as noted in the HPI. Review of Systems  Objective: Vital Signs: There were no vitals taken for this visit.  Specialty Comments:  No specialty comments available.  PMFS History: Patient Active Problem List   Diagnosis Date Noted   Personal history of diabetic foot ulcer 04/11/2020   Diabetic infection of right foot ([place]) 03/26/2020   Headache, unspecified 09/10/2019   Diarrhea, unspecified 08/16/2019   Encounter for removal of sutures 07/28/2018   Cryptogenic cirrhosis ([place]) 07/16/2018   Encounter for immunization 07/01/2018   Gastritis 01/23/2018   Acute blood loss anemia 01/23/2018   Duodenal ulcer    Upper GI bleed 01/22/2018   History of CVA  (cerebrovascular accident) 01/15/2018   Iron deficiency anemia, unspecified 11/25/2017   Coagulation defect, unspecified ([place]) 11/18/2017   Unspecified protein-calorie malnutrition ([place]) 11/11/2017   Acquired absence of spleen 10/30/2017   Nicotine dependence, unspecified, uncomplicated 78/93/8101   Other disorders of phosphorus metabolism 10/30/2017   Unspecified nephritic syndrome with unspecified morphologic changes 10/30/2017   Anemia in chronic kidney disease 10/24/2017   Dependence on renal dialysis ([place]) 10/24/2017   Encounter for screening for respiratory tuberculosis 10/24/2017   Pruritus, unspecified 10/24/2017   Secondary hyperparathyroidism of renal origin ([place]) 10/24/2017   Shortness of breath 10/24/2017   Type 1 diabetes mellitus with kidney complication ([place]) - on insulin 05/01/2017   Hemorrhagic stroke ([place])- Jan 2010 05/01/2017   H/O hyperlipidemia- sev yrs ago 05/01/2017   h/o Hypertriglyceridemia- 13000's in past 05/01/2017   H/O splenectomy 05/01/2017   Status post cholecystectomy 05/01/2017   Gout- L foot- mult times 05/01/2017   Diabetic neuropathy ([place])- 6 mo or so; never started meds 05/01/2017   Vitamin D deficiency 05/01/2017   ESRD on dialysis ([place]) 02/05/2017   Anasarca- may 2018 hosp 02/05/2017   Leukocytosis- s/p plenectomy 02/05/2017   Hypertension    Diabetes mellitus without complication (HCC)    Tobacco abuse- >er than 55 pack yr hx; current smoker    Past Medical History:  Diagnosis Date   Allergy    Anemia    Arthritis    Bleeding ulcer    Cirrhosis ([place])    w/ esophageal varices   Complication of anesthesia    " I had a nose bleed a day or two after surgery-it could have been the oxygen"   Diabetes mellitus without complication (HCC)    Diabetic neuropathy (HCC)    Elevated TSH    ESRD on dialysis ([place])    MWF   History of kidney stones    Hypertension    Portal hypertension (HCC)    Stroke ([place])    hemorrhagic 2010   Tobacco  abuse     Family History  Problem Relation Age of Onset   Lymphoma Mother    Hypertension Father    Diabetes Mellitus II Father    Hypertension Brother    Diabetes Mellitus I Brother    Hypertension Brother    Colon cancer Neg Hx    Stomach cancer Neg Hx    Colon polyps Neg Hx    Esophageal cancer Neg Hx    Rectal cancer Neg Hx     Past Surgical History:  Procedure Laterality Date   A/V FISTULAGRAM N/A 02/05/2018   Procedure: A/V FISTULAGRAM;  Surgeon: Conrad Taft [place], MD;  Location: [facility];  Service: Cardiovascular;  Laterality: N/A;   AMPUTATION Right 03/27/2020   Procedure: AMPUTATION  RAY 5th;  Surgeon: Trula Slade, DPM;  Location: [facility];  Service: Podiatry;  Laterality: Right;   AV FISTULA PLACEMENT Left 05/16/2017   Procedure: ARTERIOVENOUS (AV) FISTULA CREATION-LEFT;  Surgeon: Angelia Mould, MD;  Location: [facility];  Service: Vascular;  Laterality: Left;   CHOLECYSTECTOMY     COLONOSCOPY     ESOPHAGOGASTRODUODENOSCOPY N/A 01/23/2018   Procedure: ESOPHAGOGASTRODUODENOSCOPY (EGD);  Surgeon: Yetta Flock, MD;  Location: [facility]OSCOPY;  Service: Gastroenterology;  Laterality: N/A;   PANCREAS SURGERY     PERIPHERAL VASCULAR BALLOON ANGIOPLASTY  02/05/2018   Procedure: PERIPHERAL VASCULAR BALLOON ANGIOPLASTY;  Surgeon: Conrad [place], MD;  Location: [facility];  Service: Cardiovascular;;  left AV fistula   SPLENECTOMY     WOUND DEBRIDEMENT Right 03/30/2020   Procedure: DEBRIDEMENT WOUND RIGHT FOOT;  Surgeon: Trula Slade, DPM;  Location: [facility];  Service: Podiatry;  Laterality: Right;   Social History   Occupational History   Occupation: disability  Tobacco Use   Smoking status: Every Day    Packs/day: 0.50    Years: 40.00    Pack years: 20.00    Types: Cigarettes   Smokeless tobacco: Never  Vaping Use   Vaping Use: Never used  Substance and Sexual Activity   Alcohol use: No    Comment: quit around 2010; no h/o heavy use   Drug use:  No   Sexual activity: Not on file

## 2021-07-04 ENCOUNTER — Telehealth: Payer: Self-pay | Admitting: Orthopedic Surgery

## 2021-07-04 ENCOUNTER — Other Ambulatory Visit: Payer: Self-pay | Admitting: Orthopedic Surgery

## 2021-07-04 MED ORDER — HYDROCODONE-ACETAMINOPHEN 5-325 MG PO TABS: 1.0000 | ORAL_TABLET | Freq: Four times a day (QID) | ORAL | 0 refills | Status: DC | PRN

## 2021-07-04 NOTE — Telephone Encounter (Signed)
Pt called asking for a refill of his norco/ vicodin 5-325 mg rx, and he would like a CB when that's been sent in please.   [phone]

## 2021-07-04 NOTE — Telephone Encounter (Signed)
I called pt and advised pt that rx has been sent to pharm and that Malachy Mood will call to sch surgery for this Friday.

## 2021-07-04 NOTE — Telephone Encounter (Signed)
This pt called and wanted emergent surgical scheduling has decided to have his BKA. Asking for rx for pain see request below and advise. Thanks

## 2021-07-05 ENCOUNTER — Other Ambulatory Visit: Payer: Self-pay | Admitting: Physician Assistant

## 2021-07-05 ENCOUNTER — Other Ambulatory Visit: Payer: Self-pay

## 2021-07-05 ENCOUNTER — Encounter (HOSPITAL_COMMUNITY): Payer: Self-pay | Admitting: Orthopedic Surgery

## 2021-07-05 NOTE — Progress Notes (Addendum)
[name] denies chest pain or shortness of breath. Patient denies having any s/s of Covid in his household.  Patient denies any known exposure to Covid.   [name] has type II diabetes. Patient checks CBG at bedtime, it runs 80- 110.  [name] does not take Insulin unless CB is over 150.  I instructed patient to take 3 units if he nees to take Insulin at HS tonight.  I instructed patient to check CBG after awaking and every 2 hours until arrival  to the hospital.  I Instructed patient if CBG is less than 70 to take 4 Glucose Tablets or 1 tube of Glucose Gel or 1/2 cup of a clear juice. Recheck CBG in 15 minutes if CBG is not over 70 call, pre- op desk at [phone] for further instructions.  I instructed patient to shower with antibiotic soap, if it is available.  Dry off with a clean towel. Do not put lotion, powder, cologne or deodorant or makeup.No jewelry or piercings. Men may shave their face and neck. Woman should not shave. No nail polish, artificial or acrylic nails. Wear clean clothes, brush your teeth. Glasses, contact lens,dentures or partials may not be worn in the OR. If you need to wear them, please bring a case for glasses, do not wear contacts or bring a case, the hospital does not have contact cases, dentures or partials will have to be removed , make sure they are clean, we will provide a denture cup to put them in.

## 2021-07-06 ENCOUNTER — Encounter (HOSPITAL_COMMUNITY)
Admission: RE | Disposition: A | Discharge status: Skilled Nursing Facility | Payer: Self-pay | Source: Home / Self Care | Attending: Orthopedic Surgery

## 2021-07-06 ENCOUNTER — Other Ambulatory Visit: Payer: Self-pay

## 2021-07-06 ENCOUNTER — Inpatient Hospital Stay (HOSPITAL_COMMUNITY)
Admission: RE | Admit: 2021-07-06 | Discharge: 2021-07-10 | DRG: 474 | Disposition: A | Discharge status: Skilled Nursing Facility | Payer: Medicare Other | Attending: Orthopedic Surgery | Admitting: Orthopedic Surgery

## 2021-07-06 ENCOUNTER — Inpatient Hospital Stay (HOSPITAL_COMMUNITY): Payer: Medicare Other | Admitting: Certified Registered Nurse Anesthetist

## 2021-07-06 DIAGNOSIS — Z9081 Acquired absence of spleen: Secondary | ICD-10-CM

## 2021-07-06 DIAGNOSIS — F17210 Nicotine dependence, cigarettes, uncomplicated: Secondary | ICD-10-CM | POA: Diagnosis present

## 2021-07-06 DIAGNOSIS — Z8249 Family history of ischemic heart disease and other diseases of the circulatory system: Secondary | ICD-10-CM

## 2021-07-06 DIAGNOSIS — D62 Acute posthemorrhagic anemia: Secondary | ICD-10-CM | POA: Diagnosis not present

## 2021-07-06 DIAGNOSIS — M14671 Charcot's joint, right ankle and foot: Secondary | ICD-10-CM

## 2021-07-06 DIAGNOSIS — N186 End stage renal disease: Secondary | ICD-10-CM | POA: Diagnosis present

## 2021-07-06 DIAGNOSIS — S82891K Other fracture of right lower leg, subsequent encounter for closed fracture with nonunion: Secondary | ICD-10-CM | POA: Diagnosis present

## 2021-07-06 DIAGNOSIS — Z8673 Personal history of transient ischemic attack (TIA), and cerebral infarction without residual deficits: Secondary | ICD-10-CM | POA: Diagnosis not present

## 2021-07-06 DIAGNOSIS — E1122 Type 2 diabetes mellitus with diabetic chronic kidney disease: Secondary | ICD-10-CM | POA: Diagnosis present

## 2021-07-06 DIAGNOSIS — I120 Hypertensive chronic kidney disease with stage 5 chronic kidney disease or end stage renal disease: Secondary | ICD-10-CM | POA: Diagnosis present

## 2021-07-06 DIAGNOSIS — L8990 Pressure ulcer of unspecified site, unspecified stage: Secondary | ICD-10-CM | POA: Insufficient documentation

## 2021-07-06 DIAGNOSIS — Z992 Dependence on renal dialysis: Secondary | ICD-10-CM

## 2021-07-06 DIAGNOSIS — Z807 Family history of other malignant neoplasms of lymphoid, hematopoietic and related tissues: Secondary | ICD-10-CM

## 2021-07-06 DIAGNOSIS — N2581 Secondary hyperparathyroidism of renal origin: Secondary | ICD-10-CM | POA: Diagnosis present

## 2021-07-06 DIAGNOSIS — E11610 Type 2 diabetes mellitus with diabetic neuropathic arthropathy: Secondary | ICD-10-CM | POA: Diagnosis present

## 2021-07-06 DIAGNOSIS — S82201K Unspecified fracture of shaft of right tibia, subsequent encounter for closed fracture with nonunion: Principal | ICD-10-CM

## 2021-07-06 DIAGNOSIS — S82874A Nondisplaced pilon fracture of right tibia, initial encounter for closed fracture: Secondary | ICD-10-CM | POA: Diagnosis present

## 2021-07-06 DIAGNOSIS — E1151 Type 2 diabetes mellitus with diabetic peripheral angiopathy without gangrene: Secondary | ICD-10-CM | POA: Diagnosis present

## 2021-07-06 DIAGNOSIS — Z833 Family history of diabetes mellitus: Secondary | ICD-10-CM

## 2021-07-06 DIAGNOSIS — D631 Anemia in chronic kidney disease: Secondary | ICD-10-CM | POA: Diagnosis present

## 2021-07-06 DIAGNOSIS — L89152 Pressure ulcer of sacral region, stage 2: Secondary | ICD-10-CM | POA: Diagnosis present

## 2021-07-06 DIAGNOSIS — Z20822 Contact with and (suspected) exposure to covid-19: Secondary | ICD-10-CM | POA: Diagnosis present

## 2021-07-06 HISTORY — PX: APPLICATION OF WOUND VAC: SHX5189

## 2021-07-06 HISTORY — PX: AMPUTATION: SHX166

## 2021-07-06 HISTORY — DX: Peptic ulcer, site unspecified, unspecified as acute or chronic, without hemorrhage or perforation: K27.9

## 2021-07-06 HISTORY — DX: Duodenal ulcer, unspecified as acute or chronic, without hemorrhage or perforation: K26.9

## 2021-07-06 HISTORY — DX: Personal history of other diseases of the digestive system: Z87.19

## 2021-07-06 LAB — POCT I-STAT, CHEM 8
BUN: 34 mg/dL — ABNORMAL HIGH (ref 6–20)
Calcium, Ion: 0.95 mmol/L — ABNORMAL LOW (ref 1.15–1.40)
Chloride: 97 mmol/L — ABNORMAL LOW (ref 98–111)
Creatinine, Ser: 5.4 mg/dL — ABNORMAL HIGH (ref 0.61–1.24)
Glucose, Bld: 85 mg/dL (ref 70–99)
HCT: 42 % (ref 39.0–52.0)
Hemoglobin: 14.3 g/dL (ref 13.0–17.0)
Potassium: 3.9 mmol/L (ref 3.5–5.1)
Sodium: 135 mmol/L (ref 135–145)
TCO2: 31 mmol/L (ref 22–32)

## 2021-07-06 LAB — GLUCOSE, CAPILLARY
Glucose-Capillary: 83 mg/dL (ref 70–99)
Glucose-Capillary: 80 mg/dL (ref 70–99)
Glucose-Capillary: 75 mg/dL (ref 70–99)
Glucose-Capillary: 87 mg/dL (ref 70–99)
Glucose-Capillary: 122 mg/dL — ABNORMAL HIGH (ref 70–99)

## 2021-07-06 LAB — SARS CORONAVIRUS 2 BY RT PCR (HOSPITAL ORDER, PERFORMED IN ~~LOC~~ HOSPITAL LAB): SARS Coronavirus 2: NEGATIVE

## 2021-07-06 LAB — TYPE AND SCREEN
ABO/RH(D): O POS
Antibody Screen: NEGATIVE

## 2021-07-06 SURGERY — AMPUTATION BELOW KNEE
Anesthesia: Monitor Anesthesia Care | Site: Knee | Laterality: Right

## 2021-07-06 MED ORDER — KETAMINE HCL 10 MG/ML IJ SOLN
INTRAMUSCULAR | Status: DC | PRN
  Administered 2021-07-06 (×2): 20 mg via INTRAVENOUS

## 2021-07-06 MED ORDER — KETAMINE HCL 50 MG/5ML IJ SOSY
PREFILLED_SYRINGE | INTRAMUSCULAR | Status: AC
  Filled 2021-07-06: qty 5

## 2021-07-06 MED ORDER — PHENOL 1.4 % MT LIQD: 1.0000 | OROMUCOSAL | Status: DC | PRN

## 2021-07-06 MED ORDER — TRANEXAMIC ACID-NACL 1000-0.7 MG/100ML-% IV SOLN
1000.0000 mg | Freq: Once | INTRAVENOUS | Status: AC
Start: 2021-07-06 — End: 2021-07-07
  Administered 2021-07-06: 1000 mg via INTRAVENOUS
  Filled 2021-07-06: qty 100

## 2021-07-06 MED ORDER — POLYETHYLENE GLYCOL 3350 17 G PO PACK: 17.0000 g | PACK | Freq: Every day | ORAL | Status: DC | PRN

## 2021-07-06 MED ORDER — HYDROMORPHONE HCL 1 MG/ML IJ SOLN: 0.5000 mg | INTRAMUSCULAR | Status: DC | PRN

## 2021-07-06 MED ORDER — PROMETHAZINE HCL 25 MG/ML IJ SOLN: 6.2500 mg | INTRAMUSCULAR | Status: DC | PRN

## 2021-07-06 MED ORDER — FENTANYL CITRATE (PF) 100 MCG/2ML IJ SOLN
INTRAMUSCULAR | Status: AC
  Administered 2021-07-06: 100 ug via INTRAVENOUS
  Filled 2021-07-06: qty 2

## 2021-07-06 MED ORDER — CLINDAMYCIN PHOSPHATE 900 MG/50ML IV SOLN
900.0000 mg | Freq: Three times a day (TID) | INTRAVENOUS | Status: AC
  Administered 2021-07-06 – 2021-07-07 (×3): 900 mg via INTRAVENOUS
  Filled 2021-07-06 (×3): qty 50

## 2021-07-06 MED ORDER — JUVEN PO PACK
1.0000 | PACK | Freq: Two times a day (BID) | ORAL | Status: DC
  Administered 2021-07-08: 1 via ORAL
  Filled 2021-07-06: qty 1

## 2021-07-06 MED ORDER — PROPOFOL 10 MG/ML IV BOLUS
INTRAVENOUS | Status: AC
  Filled 2021-07-06: qty 20

## 2021-07-06 MED ORDER — OXYCODONE HCL 5 MG PO TABS
5.0000 mg | ORAL_TABLET | ORAL | Status: DC | PRN
  Administered 2021-07-10: 10 mg via ORAL
  Filled 2021-07-06: qty 2

## 2021-07-06 MED ORDER — ZINC SULFATE 220 (50 ZN) MG PO CAPS
220.0000 mg | ORAL_CAPSULE | Freq: Every day | ORAL | Status: DC
  Administered 2021-07-07 – 2021-07-10 (×4): 220 mg via ORAL
  Filled 2021-07-06 (×4): qty 1

## 2021-07-06 MED ORDER — FENTANYL CITRATE (PF) 100 MCG/2ML IJ SOLN: 100.0000 ug | Freq: Once | INTRAMUSCULAR | Status: AC

## 2021-07-06 MED ORDER — BISACODYL 5 MG PO TBEC
5.0000 mg | DELAYED_RELEASE_TABLET | Freq: Every day | ORAL | Status: DC | PRN
  Filled 2021-07-06 (×2): qty 1

## 2021-07-06 MED ORDER — FENTANYL CITRATE (PF) 250 MCG/5ML IJ SOLN
INTRAMUSCULAR | Status: AC
  Filled 2021-07-06: qty 5

## 2021-07-06 MED ORDER — PROPOFOL 500 MG/50ML IV EMUL
INTRAVENOUS | Status: DC | PRN
  Administered 2021-07-06: 40 ug/kg/min via INTRAVENOUS

## 2021-07-06 MED ORDER — ONDANSETRON HCL 4 MG/2ML IJ SOLN
4.0000 mg | Freq: Four times a day (QID) | INTRAMUSCULAR | Status: DC | PRN
  Administered 2021-07-07 – 2021-07-10 (×3): 4 mg via INTRAVENOUS
  Filled 2021-07-06 (×4): qty 2

## 2021-07-06 MED ORDER — MAGNESIUM SULFATE 2 GM/50ML IV SOLN
2.0000 g | Freq: Every day | INTRAVENOUS | Status: DC | PRN
  Filled 2021-07-06: qty 50

## 2021-07-06 MED ORDER — DOXERCALCIFEROL 4 MCG/2ML IV SOLN
6.0000 ug | INTRAVENOUS | Status: DC
  Administered 2021-07-09: 6 ug via INTRAVENOUS
  Filled 2021-07-06 (×2): qty 4

## 2021-07-06 MED ORDER — SODIUM CHLORIDE 0.9 % IV SOLN: INTRAVENOUS | Status: DC

## 2021-07-06 MED ORDER — ORAL CARE MOUTH RINSE: 15.0000 mL | Freq: Once | OROMUCOSAL | Status: AC

## 2021-07-06 MED ORDER — LACTATED RINGERS IV SOLN: INTRAVENOUS | Status: DC

## 2021-07-06 MED ORDER — BUPIVACAINE LIPOSOME 1.3 % IJ SUSP
INTRAMUSCULAR | Status: DC | PRN
  Administered 2021-07-06: 10 mL via PERINEURAL

## 2021-07-06 MED ORDER — ACETAMINOPHEN 500 MG PO TABS
1000.0000 mg | ORAL_TABLET | Freq: Once | ORAL | Status: DC
  Filled 2021-07-06: qty 2

## 2021-07-06 MED ORDER — OXYCODONE HCL 5 MG PO TABS: 10.0000 mg | ORAL_TABLET | ORAL | Status: DC | PRN

## 2021-07-06 MED ORDER — MAGNESIUM CITRATE PO SOLN: 1.0000 | Freq: Once | ORAL | Status: DC | PRN

## 2021-07-06 MED ORDER — MIDAZOLAM HCL 2 MG/2ML IJ SOLN: 2.0000 mg | Freq: Once | INTRAMUSCULAR | Status: AC

## 2021-07-06 MED ORDER — DOCUSATE SODIUM 100 MG PO CAPS
100.0000 mg | ORAL_CAPSULE | Freq: Every day | ORAL | Status: DC
  Administered 2021-07-07 – 2021-07-09 (×3): 100 mg via ORAL
  Filled 2021-07-06 (×4): qty 1

## 2021-07-06 MED ORDER — PANTOPRAZOLE SODIUM 40 MG PO TBEC
40.0000 mg | DELAYED_RELEASE_TABLET | Freq: Every evening | ORAL | Status: DC
  Administered 2021-07-07 – 2021-07-10 (×4): 40 mg via ORAL
  Filled 2021-07-06 (×5): qty 1

## 2021-07-06 MED ORDER — ACETAMINOPHEN 325 MG PO TABS
325.0000 mg | ORAL_TABLET | Freq: Four times a day (QID) | ORAL | Status: DC | PRN
  Administered 2021-07-07 – 2021-07-10 (×2): 650 mg via ORAL
  Filled 2021-07-06 (×2): qty 2

## 2021-07-06 MED ORDER — 0.9 % SODIUM CHLORIDE (POUR BTL) OPTIME
TOPICAL | Status: DC | PRN
  Administered 2021-07-06: 1000 mL

## 2021-07-06 MED ORDER — GUAIFENESIN-DM 100-10 MG/5ML PO SYRP: 15.0000 mL | ORAL_SOLUTION | ORAL | Status: DC | PRN

## 2021-07-06 MED ORDER — MIDAZOLAM HCL 2 MG/2ML IJ SOLN
INTRAMUSCULAR | Status: AC
  Administered 2021-07-06: 2 mg via INTRAVENOUS
  Filled 2021-07-06: qty 2

## 2021-07-06 MED ORDER — FENTANYL CITRATE (PF) 100 MCG/2ML IJ SOLN: 25.0000 ug | INTRAMUSCULAR | Status: DC | PRN

## 2021-07-06 MED ORDER — CHLORHEXIDINE GLUCONATE 0.12 % MT SOLN
15.0000 mL | Freq: Once | OROMUCOSAL | Status: AC
  Administered 2021-07-06: 15 mL via OROMUCOSAL
  Filled 2021-07-06: qty 15

## 2021-07-06 MED ORDER — AMLODIPINE BESYLATE 10 MG PO TABS
10.0000 mg | ORAL_TABLET | Freq: Every day | ORAL | Status: DC
  Administered 2021-07-07 – 2021-07-10 (×4): 10 mg via ORAL
  Filled 2021-07-06 (×4): qty 1

## 2021-07-06 MED ORDER — AMISULPRIDE (ANTIEMETIC) 5 MG/2ML IV SOLN: 10.0000 mg | Freq: Once | INTRAVENOUS | Status: DC | PRN

## 2021-07-06 MED ORDER — MIDAZOLAM HCL 2 MG/2ML IJ SOLN
INTRAMUSCULAR | Status: AC
  Filled 2021-07-06: qty 2

## 2021-07-06 MED ORDER — CLINDAMYCIN PHOSPHATE 900 MG/50ML IV SOLN
900.0000 mg | INTRAVENOUS | Status: AC
  Administered 2021-07-06: 900 mg via INTRAVENOUS
  Filled 2021-07-06: qty 50

## 2021-07-06 MED ORDER — POTASSIUM CHLORIDE CRYS ER 20 MEQ PO TBCR: 20.0000 meq | EXTENDED_RELEASE_TABLET | Freq: Every day | ORAL | Status: DC | PRN

## 2021-07-06 MED ORDER — ONDANSETRON HCL 4 MG/2ML IJ SOLN
INTRAMUSCULAR | Status: DC | PRN
  Administered 2021-07-06: 4 mg via INTRAVENOUS

## 2021-07-06 MED ORDER — FENTANYL CITRATE (PF) 250 MCG/5ML IJ SOLN
INTRAMUSCULAR | Status: DC | PRN
  Administered 2021-07-06 (×2): 50 ug via INTRAVENOUS

## 2021-07-06 MED ORDER — SEVELAMER CARBONATE 800 MG PO TABS
1600.0000 mg | ORAL_TABLET | Freq: Three times a day (TID) | ORAL | Status: DC
  Administered 2021-07-06 – 2021-07-10 (×12): 1600 mg via ORAL
  Filled 2021-07-06 (×12): qty 2

## 2021-07-06 MED ORDER — INSULIN ASPART 100 UNIT/ML IJ SOLN
0.0000 [IU] | Freq: Three times a day (TID) | INTRAMUSCULAR | Status: DC
  Administered 2021-07-09: 2 [IU] via SUBCUTANEOUS

## 2021-07-06 MED ORDER — ALUM & MAG HYDROXIDE-SIMETH 200-200-20 MG/5ML PO SUSP: 15.0000 mL | ORAL | Status: DC | PRN

## 2021-07-06 MED ORDER — MIDAZOLAM HCL 5 MG/5ML IJ SOLN
INTRAMUSCULAR | Status: DC | PRN
  Administered 2021-07-06: 1 mg via INTRAVENOUS

## 2021-07-06 MED ORDER — BUPIVACAINE HCL (PF) 0.5 % IJ SOLN
INTRAMUSCULAR | Status: DC | PRN
  Administered 2021-07-06 (×2): 15 mL via PERINEURAL

## 2021-07-06 MED ORDER — NADOLOL 40 MG PO TABS
40.0000 mg | ORAL_TABLET | Freq: Every day | ORAL | Status: DC
  Administered 2021-07-07 – 2021-07-10 (×4): 40 mg via ORAL
  Filled 2021-07-06 (×4): qty 1

## 2021-07-06 SURGICAL SUPPLY — 38 items
BAG COUNTER SPONGE SURGICOUNT (BAG) IMPLANT
BAG SPNG CNTER NS LX DISP (BAG)
BLADE SAW RECIP 87.9 MT (BLADE) ×3 IMPLANT
BLADE SURG 21 STRL SS (BLADE) ×3 IMPLANT
BNDG COHESIVE 6X5 TAN STRL LF (GAUZE/BANDAGES/DRESSINGS) IMPLANT
CANISTER WOUND CARE 500ML ATS (WOUND CARE) ×3 IMPLANT
COVER SURGICAL LIGHT HANDLE (MISCELLANEOUS) ×3 IMPLANT
CUFF TOURN SGL QUICK 34 (TOURNIQUET CUFF) ×3
CUFF TRNQT CYL 34X4.125X (TOURNIQUET CUFF) ×2 IMPLANT
DRAPE DERMATAC (DRAPES) ×3 IMPLANT
DRAPE INCISE IOBAN 66X45 STRL (DRAPES) ×3 IMPLANT
DRAPE U-SHAPE 47X51 STRL (DRAPES) ×3 IMPLANT
DRESSING PREVENA PLUS CUSTOM (GAUZE/BANDAGES/DRESSINGS) ×2 IMPLANT
DRSG PREVENA PLUS CUSTOM (GAUZE/BANDAGES/DRESSINGS) ×6
DURAPREP 26ML APPLICATOR (WOUND CARE) ×3 IMPLANT
ELECT REM PT RETURN 9FT ADLT (ELECTROSURGICAL) ×3
ELECTRODE REM PT RTRN 9FT ADLT (ELECTROSURGICAL) ×2 IMPLANT
GLOVE SURG ORTHO LTX SZ9 (GLOVE) ×3 IMPLANT
GLOVE SURG UNDER POLY LF SZ9 (GLOVE) ×3 IMPLANT
GOWN STRL REUS W/ TWL XL LVL3 (GOWN DISPOSABLE) ×4 IMPLANT
GOWN STRL REUS W/TWL XL LVL3 (GOWN DISPOSABLE) ×6
KIT BASIN OR (CUSTOM PROCEDURE TRAY) ×3 IMPLANT
KIT TURNOVER KIT B (KITS) ×3 IMPLANT
MANIFOLD NEPTUNE II (INSTRUMENTS) ×3 IMPLANT
NS IRRIG 1000ML POUR BTL (IV SOLUTION) ×3 IMPLANT
PACK ORTHO EXTREMITY (CUSTOM PROCEDURE TRAY) ×3 IMPLANT
PAD ARMBOARD 7.5X6 YLW CONV (MISCELLANEOUS) ×3 IMPLANT
PREVENA RESTOR ARTHOFORM 46X30 (CANNISTER) ×3 IMPLANT
SPONGE T-LAP 18X18 ~~LOC~~+RFID (SPONGE) IMPLANT
STAPLER VISISTAT 35W (STAPLE) IMPLANT
STOCKINETTE IMPERVIOUS LG (DRAPES) ×3 IMPLANT
SUT ETHILON 2 0 PSLX (SUTURE) IMPLANT
SUT SILK 2 0 (SUTURE) ×3
SUT SILK 2-0 18XBRD TIE 12 (SUTURE) ×2 IMPLANT
SUT VIC AB 1 CTX 27 (SUTURE) ×6 IMPLANT
TOWEL GREEN STERILE (TOWEL DISPOSABLE) ×3 IMPLANT
TUBE CONNECTING 12X1/4 (SUCTIONS) ×3 IMPLANT
YANKAUER SUCT BULB TIP NO VENT (SUCTIONS) ×3 IMPLANT

## 2021-07-06 NOTE — Interval H&P Note (Signed)
History and Physical Interval Note:  07/06/2021 12:48 PM  Brian [name]  has presented today for surgery, with the diagnosis of Non Union Right Pilon Fracture.  The various methods of treatment have been discussed with the patient and family. After consideration of risks, benefits and other options for treatment, the patient has consented to  Procedure(s): RIGHT BELOW KNEE AMPUTATION (Right) APPLICATION OF WOUND VAC as a surgical intervention.  The patient's history has been reviewed, patient examined, no change in status, stable for surgery.  I have reviewed the patient's chart and labs.  Questions were answered to the patient's satisfaction.     Newt Minion

## 2021-07-06 NOTE — Transfer of Care (Signed)
Immediate Anesthesia Transfer of Care Note  Patient: Brian [name]  Procedure(s) Performed: RIGHT BELOW KNEE AMPUTATION (Right: Knee) APPLICATION OF WOUND VAC  Patient Location: PACU  Anesthesia Type:MAC combined with regional for post-op pain  Level of Consciousness: awake and confused  Airway & Oxygen Therapy: Patient Spontanous Breathing and Patient connected to nasal cannula oxygen  Post-op Assessment: Report given to RN and Post -op Vital signs reviewed and stable  Post vital signs: Reviewed and stable  Last Vitals:  Vitals Value Taken Time  BP 112/78 07/06/21 1358  Temp    Pulse 51 07/06/21 1400  Resp 11 07/06/21 1400  SpO2 100 % 07/06/21 1400  Vitals shown include unvalidated device data.  Last Pain:  Vitals:   07/06/21 1140  TempSrc:   PainSc: 0-No pain         Complications: No notable events documented.

## 2021-07-06 NOTE — Anesthesia Procedure Notes (Signed)
Anesthesia Regional Block: Popliteal block   Pre-Anesthetic Checklist: , timeout performed,  Correct Patient, Correct Site, Correct Laterality,  Correct Procedure, Correct Position, site marked,  Risks and benefits discussed,  Surgical consent,  Pre-op evaluation,  At surgeon's request and post-op pain management  Laterality: Right  Prep: chloraprep       Needles:  Injection technique: Single-shot  Needle Type: Echogenic Stimulator Needle          Additional Needles:   Procedures:,,,, ultrasound used (permanent image in chart),,    Narrative:  Start time: 07/06/2021 11:14 AM End time: 07/06/2021 11:24 AM Injection made incrementally with aspirations every 5 mL.  Performed by: Personally  Anesthesiologist: Duane Boston, MD  Additional Notes: A functioning IV was confirmed and monitors were applied.  Sterile prep and drape, hand hygiene and sterile gloves were used.  Negative aspiration and test dose prior to incremental administration of local anesthetic. The patient tolerated the procedure well.Ultrasound  guidance: relevant anatomy identified, needle position confirmed, local anesthetic spread visualized around nerve(s), vascular puncture avoided.  Image printed for medical record. ACB supplement.

## 2021-07-06 NOTE — H&P (Signed)
Brian [name] is an 60 y.o. male.   Chief Complaint: Chronic nonunion right ankle fracture HPI: Patient is a 60 year old gentleman who has had a nonunion of a pilon fracture on the right he still has instability ambulating in a wheelchair.  Patient is on dialysis with end-stage renal disease secondary to diabetes.  Past Medical History:  Diagnosis Date   Allergy    Anemia    Arthritis    Bleeding ulcer    Cirrhosis ([place])    w/ esophageal varices   Complication of anesthesia    " I had a nose bleed a day or two after surgery-it could have been the oxygen"   Diabetes mellitus without complication (HCC)    type II   Diabetic neuropathy (HCC)    Duodenal ulcer    Elevated TSH    ESRD on dialysis ([place])    MWF   History of hiatal hernia    History of kidney stones    Hypertension    Portal hypertension (HCC)    Stress ulcer    after surgery- "only one time"  patient is on pantoprazole daily   Stroke [facility])    hemorrhagic 2010   Tobacco abuse     Past Surgical History:  Procedure Laterality Date   A/V FISTULAGRAM N/A 02/05/2018   Procedure: A/V FISTULAGRAM;  Surgeon: Conrad [place], MD;  Location: [place] [facility];  Service: Cardiovascular;  Laterality: N/A;   AMPUTATION Right 03/27/2020   Procedure: AMPUTATION RAY 5th;  Surgeon: Trula Slade, DPM;  Location: [place];  Service: Podiatry;  Laterality: Right;   AV FISTULA PLACEMENT Left 05/16/2017   Procedure: ARTERIOVENOUS (AV) FISTULA CREATION-LEFT;  Surgeon: Angelia Mould, MD;  Location: [facility] OR;  Service: Vascular;  Laterality: Left;   CHOLECYSTECTOMY     COLONOSCOPY     ESOPHAGOGASTRODUODENOSCOPY N/A 01/23/2018   Procedure: ESOPHAGOGASTRODUODENOSCOPY (EGD);  Surgeon: Yetta Flock, MD;  Location: [facility] ENDOSCOPY;  Service: Gastroenterology;  Laterality: N/A;   LITHOTRIPSY     PANCREAS SURGERY     PERIPHERAL VASCULAR BALLOON ANGIOPLASTY  02/05/2018   Procedure: PERIPHERAL VASCULAR BALLOON ANGIOPLASTY;   Surgeon: Conrad Texline, MD;  Location: [place] [facility];  Service: Cardiovascular;;  left AV fistula   SPLENECTOMY     WOUND DEBRIDEMENT Right 03/30/2020   Procedure: DEBRIDEMENT WOUND RIGHT FOOT;  Surgeon: Trula Slade, DPM;  Location: [place];  Service: Podiatry;  Laterality: Right;    Family History  Problem Relation Age of Onset   Lymphoma Mother    Hypertension Father    Diabetes Mellitus II Father    Hypertension Brother    Diabetes Mellitus I Brother    Hypertension Brother    Colon cancer Neg Hx    Stomach cancer Neg Hx    Colon polyps Neg Hx    Esophageal cancer Neg Hx    Rectal cancer Neg Hx    Social History:  reports that he has been smoking cigarettes. He has a 20.00 pack-year smoking history. He has never used smokeless tobacco. He reports that he does not drink alcohol and does not use drugs.  Allergies:  Allergies  Allergen Reactions   Penicillins Hives and Rash    Has patient had a PCN reaction causing immediate rash, facial/tongue/throat swelling, SOB or lightheadedness with hypotension: Yes Has patient had a PCN reaction causing severe rash involving mucus membranes or skin necrosis: No Has patient had a PCN reaction that required hospitalization: No Has patient had  a PCN reaction occurring within the last 10 years: No If all of the above answers are "NO", then may proceed with Cephalosporin use. Ceftriaxone = no issues   Metoprolol Other (See Comments)    Cause inflamed stomach   Nsaids     Due to kidneys   Aspirin Nausea And Vomiting    Other Reaction: GI Upset Bleeding ulcer    No medications prior to admission.    No results found for this or any previous visit (from the past 48 hour(s)). No results found.  Review of Systems  Height 5\' 10"  (1.778 m), weight 80.2 kg. Physical Exam     Patient is alert, oriented, no adenopathy, well-dressed, normal affect, normal respiratory effort. Examination patient's pilon ankle fracture is  unstable.  There is no skin breakdown there is no cellulitis.  I cannot palpate a good pulse.Heart RRR Lungs Clear Assessment/Plan 1. Closed nondisplaced pilon fracture of right tibia, sequela   2. PVD (peripheral vascular disease) ([place])   3. ESRD on dialysis [facility])       Plan: Discussed with the patient high likelihood of wound dehiscence with internal fixation discussed using plates and screws versus a intramedullary nail.  I feel the patient would have a better success rate and better function with an amputation.  We will plan for for patient to call us when he determines what treatment option he would prefer.  Bevely Palmer Elsia [place], [place] 07/06/2021, 6:42 AM

## 2021-07-06 NOTE — Anesthesia Postprocedure Evaluation (Signed)
Anesthesia Post Note  Patient: Brian [name]  Procedure(s) Performed: RIGHT BELOW KNEE AMPUTATION (Right: Knee) APPLICATION OF WOUND VAC     Patient location during evaluation: PACU Anesthesia Type: Regional and MAC Level of consciousness: awake and alert Pain management: pain level controlled Vital Signs Assessment: post-procedure vital signs reviewed and stable Respiratory status: spontaneous breathing and respiratory function stable Cardiovascular status: stable Postop Assessment: no apparent nausea or vomiting Anesthetic complications: no   No notable events documented.  Last Vitals:  Vitals:   07/06/21 1415 07/06/21 1430  BP: 112/75 (!) 125/91  Pulse: (!) 49 (!) 55  Resp: 16 19  Temp:    SpO2: 98% 95%    Last Pain:  Vitals:   07/06/21 1430  TempSrc:   PainSc: 0-No pain        RLE Motor Response: Purposeful movement (07/06/21 1430)        [name] DANIEL

## 2021-07-06 NOTE — Consult Note (Signed)
[facility] Renal Consultation Note    Indication for Consultation:  Management of ESRD/hemodialysis, anemia, hypertension/volume, and secondary hyperparathyroidism. PCP:  HPI: Brian [name] is a 60 y.o. male with ESRD, T2DM, Hx CVA, HTN who is being admitted for elective RIGHT below the knee amputation.  Fell on 11/02/20 and sustained compound fracture of R tibia/fibula. Has been in cast since that time and unfortunately the bones still have not healed. Decision was made to pursue R BKA which was done today by Dr. Sharol Given.  Seen in PACU - denies CP, dyspnea, N/V/D, abdominal pain, fever, chills. No leg pain at this time.  Dialyzes at [facility] on MWF schedule - last HD was Wed 9/28 which he completed in entirety. He is very complaint with dialysis and medications.  Past Medical History:  Diagnosis Date   Allergy    Anemia    Arthritis    Bleeding ulcer    Cirrhosis ([place])    w/ esophageal varices   Complication of anesthesia    " I had a nose bleed a day or two after surgery-it could have been the oxygen"   Diabetes mellitus without complication (HCC)    type II   Diabetic neuropathy (HCC)    Duodenal ulcer    Elevated TSH    ESRD on dialysis ([place])    MWF   History of hiatal hernia    History of kidney stones    Hypertension    Portal hypertension (HCC)    Stress ulcer    after surgery- "only one time"  patient is on pantoprazole daily   Stroke [facility])    hemorrhagic 2010   Tobacco abuse    Past Surgical History:  Procedure Laterality Date   A/V FISTULAGRAM N/A 02/05/2018   Procedure: A/V FISTULAGRAM;  Surgeon: Conrad Tyronza, MD;  Location: [place] [facility];  Service: Cardiovascular;  Laterality: N/A;   AMPUTATION Right 03/27/2020   Procedure: AMPUTATION RAY 5th;  Surgeon: Trula Slade, DPM;  Location: [place];  Service: Podiatry;  Laterality: Right;   AV FISTULA PLACEMENT Left 05/16/2017   Procedure: ARTERIOVENOUS (AV) FISTULA CREATION-LEFT;  Surgeon:  Angelia Mould, MD;  Location: [facility] OR;  Service: Vascular;  Laterality: Left;   CHOLECYSTECTOMY     COLONOSCOPY     ESOPHAGOGASTRODUODENOSCOPY N/A 01/23/2018   Procedure: ESOPHAGOGASTRODUODENOSCOPY (EGD);  Surgeon: Yetta Flock, MD;  Location: [facility] ENDOSCOPY;  Service: Gastroenterology;  Laterality: N/A;   LITHOTRIPSY     PANCREAS SURGERY     PERIPHERAL VASCULAR BALLOON ANGIOPLASTY  02/05/2018   Procedure: PERIPHERAL VASCULAR BALLOON ANGIOPLASTY;  Surgeon: Conrad Del Muerto, MD;  Location: [place] [facility];  Service: Cardiovascular;;  left AV fistula   SPLENECTOMY     WOUND DEBRIDEMENT Right 03/30/2020   Procedure: DEBRIDEMENT WOUND RIGHT FOOT;  Surgeon: Trula Slade, DPM;  Location: [place];  Service: Podiatry;  Laterality: Right;   Family History  Problem Relation Age of Onset   Lymphoma Mother    Hypertension Father    Diabetes Mellitus II Father    Hypertension Brother    Diabetes Mellitus I Brother    Hypertension Brother    Colon cancer Neg Hx    Stomach cancer Neg Hx    Colon polyps Neg Hx    Esophageal cancer Neg Hx    Rectal cancer Neg Hx    Social History:  reports that he has been smoking cigarettes. He has a 20.00 pack-year smoking history. He has never used  smokeless tobacco. He reports that he does not drink alcohol and does not use drugs.  ROS: As per HPI otherwise negative.  Physical Exam: Vitals:   07/06/21 1135 07/06/21 1140 07/06/21 1150 07/06/21 1155  BP: 104/62 101/61 105/70 111/73  Pulse: (!) 51 (!) 52 (!) 51 (!) 53  Resp: 17 11 13 20   Temp:      TempSrc:      SpO2: 100% 100% 99% 99%  Weight:      Height:         General: Well developed, well nourished, in no acute distress. Nasal O2 in place. Head: Normocephalic, atraumatic, sclera non-icteric, mucus membranes are moist. Neck: Supple without lymphadenopathy/masses. JVD not elevated. Lungs: Clear bilaterally to auscultation without wheezes, rales, or rhonchi. Breathing is  unlabored. Heart: RRR with normal S1, S2. No murmurs, rubs, or gallops appreciated. Abdomen: Soft, non-tender, non-distended with normoactive bowel sounds. No rebound/guarding. No obvious abdominal masses. Musculoskeletal:  Strength and tone appear normal for age. Lower extremities: R BKA bandaged - wound vac tubing in place Neuro: Alert and oriented X 3. Moves all extremities spontaneously. Psych:  Responds to questions appropriately with a normal affect. Dialysis Access: L forearm AVF + thrill  Allergies  Allergen Reactions   Penicillins Hives and Rash    Has patient had a PCN reaction causing immediate rash, facial/tongue/throat swelling, SOB or lightheadedness with hypotension: Yes Has patient had a PCN reaction causing severe rash involving mucus membranes or skin necrosis: No Has patient had a PCN reaction that required hospitalization: No Has patient had a PCN reaction occurring within the last 10 years: No If all of the above answers are "NO", then may proceed with Cephalosporin use. Ceftriaxone = no issues   Metoprolol Other (See Comments)    Cause inflamed stomach   Nsaids     Due to kidneys   Aspirin Nausea And Vomiting    Other Reaction: GI Upset Bleeding ulcer   Prior to Admission medications   Medication Sig Start Date End Date Taking? Authorizing Provider  acetaminophen (TYLENOL) 650 MG CR tablet Take 650 mg by mouth every 8 (eight) hours as needed for pain.   Yes [provider]  amLODipine (NORVASC) 10 MG tablet Take 1 tablet (10 mg total) by mouth daily. 04/02/20  Yes Thurnell Lose, MD  HYDROcodone-acetaminophen (NORCO/VICODIN) 5-325 MG tablet Take 1 tablet by mouth every 6 (six) hours as needed for moderate pain. 07/04/21  Yes Newt Minion, MD  insulin aspart protamine- aspart (NOVOLOG MIX 70/30) (70-30) 100 UNIT/ML injection Inject 0.05 mLs (5 Units total) into the skin at bedtime. Patient taking differently: Inject 5 Units into the skin at bedtime as  needed (high blood sugar). Depending on blood sugar. 02/07/17  Yes Eugenie Filler, MD  loperamide (IMODIUM) 2 MG capsule Take 2 mg by mouth as needed for diarrhea or loose stools.   Yes [provider]  nadolol (CORGARD) 40 MG tablet Take 1 tablet (40 mg total) by mouth daily. 05/01/21  Yes Armbruster, Carlota Raspberry, MD  pantoprazole (PROTONIX) 20 MG tablet Take 1 tablet by mouth once daily 06/20/21  Yes Armbruster, Carlota Raspberry, MD  sevelamer carbonate (RENVELA) 800 MG tablet Take 1,600 mg by mouth 3 (three) times daily. 07/03/21  Yes [provider]   Current Facility-Administered Medications  Medication Dose Route Frequency Provider Last Rate Last Admin   0.9 %  sodium chloride infusion   Intravenous Continuous Duane Boston, MD 200 mL/hr at 07/06/21 1351 Rate  Change at 07/06/21 1351   acetaminophen (TYLENOL) tablet 1,000 mg  1,000 mg Oral Once Duane Boston, MD       Labs: Pending.  CBG: Recent Labs  Lab 07/06/21 0923 07/06/21 1149 07/06/21 1358  GLUCAP 83 80 75   Dialysis Orders:  MWF at [facility] - last 9/28 4hr, 400/800, EDW 80.5kg, 2K/2Ca, AVF, heparin 3000 bolus - No ESA - Hectoral 29mcg IV q HD  Assessment/Plan:  Nonunion of R tibia/fibula Fx s/p R BKA on 9/30: Per ortho.  ESRD: Usual MWF schedule - will plan to dialyze tomorrow (unless labs pending labs indicate more urgent reason). No heparin.  Hypertension/volume: BP controlled, no edema on exam. UF as tolerated.  Anemia: Hgb pending - typically not anemic, but anticipating post-op drop and can give ESA if needed.  Metabolic bone disease: Labs pending, continue home binders/VDRA.  Nutrition:  Start protein supplements for healing.  T2DM  Veneta Penton, Hershal Coria 07/06/2021, 2:02 PM  [name]

## 2021-07-06 NOTE — Interval H&P Note (Signed)
History and Physical Interval Note:  07/06/2021 11:26 AM  Brian [name]  has presented today for surgery, with the diagnosis of Non Union Right Pilon Fracture.  The various methods of treatment have been discussed with the patient and family. After consideration of risks, benefits and other options for treatment, the patient has consented to  Procedure(s): RIGHT BELOW KNEE AMPUTATION (Right) as a surgical intervention.  The patient's history has been reviewed, patient examined, no change in status, stable for surgery.  I have reviewed the patient's chart and labs.  Questions were answered to the patient's satisfaction.     Newt Minion

## 2021-07-06 NOTE — Anesthesia Preprocedure Evaluation (Addendum)
Anesthesia Evaluation  Patient identified by MRN, date of birth, ID band Patient awake    Reviewed: Allergy & Precautions, NPO status , Patient's Chart, lab work & pertinent test results  History of Anesthesia Complications Negative for: history of anesthetic complications  Airway Mallampati: II  TM Distance: >3 FB Neck ROM: Full    Dental no notable dental hx. (+) Dental Advisory Given   Pulmonary Current Smoker and Patient abstained from smoking.,    Pulmonary exam normal breath sounds clear to auscultation       Cardiovascular hypertension, Pt. on medications and Pt. on home beta blockers Normal cardiovascular exam Rhythm:Regular Rate:Normal  Ecg: sr, rate 91    Neuro/Psych CVA, No Residual Symptoms negative psych ROS   GI/Hepatic Neg liver ROS, PUD,   Endo/Other  diabetes, Insulin Dependent  Renal/GU ESRF and DialysisRenal disease     Musculoskeletal  (+) Arthritis , Gout   Abdominal   Peds  Hematology  (+) anemia ,   Anesthesia Other Findings Right foot wound  Reproductive/Obstetrics                            Anesthesia Physical  Anesthesia Plan  ASA: 3  Anesthesia Plan: MAC and Regional   Post-op Pain Management:  Regional for Post-op pain   Induction: Intravenous  PONV Risk Score and Plan: 2 and Ondansetron, Midazolam and Dexamethasone  Airway Management Planned: Natural Airway and Simple Face Mask  Additional Equipment:   Intra-op Plan:   Post-operative Plan:   Informed Consent: I have reviewed the patients History and Physical, chart, labs and discussed the procedure including the risks, benefits and alternatives for the proposed anesthesia with the patient or authorized representative who has indicated his/her understanding and acceptance.     Dental advisory given  Plan Discussed with: Anesthesiologist, CRNA and Surgeon  Anesthesia Plan Comments:        Anesthesia Quick Evaluation

## 2021-07-06 NOTE — Op Note (Signed)
   Date of Surgery: 07/06/2021  INDICATIONS: Brian [name] is a 60 y.o.-year-old male who is diabetic with end-stage renal disease on dialysis.  Patient previously has had a Charcot collapse of a right ankle pilon fracture.  Despite conservative care patient has had a nonunion.  With patient's peripheral vascular disease he is not a candidate for ankle fusion and presents at this time for transtibial amputation.Marland Kitchen  PREOPERATIVE DIAGNOSIS: Charcot nonunion right ankle  POSTOPERATIVE DIAGNOSIS: Same.  PROCEDURE: Transtibial amputation Application of Prevena wound VAC  SURGEON: Sharol Given, M.D.  ANESTHESIA:  general  IV FLUIDS AND URINE: See anesthesia records.  ESTIMATED BLOOD LOSS: See anesthesia records.  COMPLICATIONS: None.  DESCRIPTION OF PROCEDURE: The patient was brought to the operating room after undergoing regional anesthetic. After adequate levels of anesthesia were obtained patient's lower extremity was prepped using DuraPrep draped into a sterile field. A timeout was called. The foot was draped out of the sterile field with impervious stockinette. A transverse incision was made 11 cm distal to the tibial tubercle. This curved proximally and a large posterior flap was created. The tibia was transected 1 cm proximal to the skin incision. The fibula was transected just proximal to the tibial incision. The tibia was beveled anteriorly. A large posterior flap was created. The sciatic nerve was pulled cut and allowed to retract. The vascular bundles were suture ligated with 2-0 silk. The deep and superficial fascial layers were closed using #1 Vicryl. The skin was closed using staples and 2-0 nylon. The wound was covered with a Prevena customizable and arthroform wound VAC.  The dressing was sealed with dermatac there was a good suction fit. A prosthetic shrinker and limb protector were applied. Patient was taken to the PACU in stable condition.   DISCHARGE PLANNING:  Antibiotic duration: 24  hours  Weightbearing: Nonweightbearing on the operative extremity  Pain medication: Opioid pathway  Dressing care/ Wound VAC: Continue wound VAC for 1 week after discharge  Discharge to: Discharge planning based on therapy's recommendations for possible inpatient rehabilitation, outpatient rehabilitation, or discharge to home with therapy  Follow-up: In the office 1 week post operative.  Meridee Score, MD [place] 1:58 PM

## 2021-07-06 NOTE — Progress Notes (Signed)
Orthopedic Tech Progress Note Patient Details:  Brian [name] 1961-01-08 [id number] Called order into [facility] Patient ID: [name], male   DOB: 07-Oct-1961, 60 y.o.   MRN: [id number]  [name] 07/06/2021, 6:30 PM

## 2021-07-07 ENCOUNTER — Encounter (HOSPITAL_COMMUNITY): Payer: Self-pay | Admitting: Orthopedic Surgery

## 2021-07-07 DIAGNOSIS — L8990 Pressure ulcer of unspecified site, unspecified stage: Secondary | ICD-10-CM | POA: Insufficient documentation

## 2021-07-07 LAB — GLUCOSE, CAPILLARY
Glucose-Capillary: 119 mg/dL — ABNORMAL HIGH (ref 70–99)
Glucose-Capillary: 97 mg/dL (ref 70–99)
Glucose-Capillary: 98 mg/dL (ref 70–99)

## 2021-07-07 LAB — CBC
HCT: 36.8 % — ABNORMAL LOW (ref 39.0–52.0)
Hemoglobin: 12.1 g/dL — ABNORMAL LOW (ref 13.0–17.0)
MCH: 32.7 pg (ref 26.0–34.0)
MCHC: 32.9 g/dL (ref 30.0–36.0)
MCV: 99.5 fL (ref 80.0–100.0)
Platelets: 206 10*3/uL (ref 150–400)
RBC: 3.7 MIL/uL — ABNORMAL LOW (ref 4.22–5.81)
RDW: 16 % — ABNORMAL HIGH (ref 11.5–15.5)
WBC: 10.2 10*3/uL (ref 4.0–10.5)
nRBC: 0 % (ref 0.0–0.2)

## 2021-07-07 LAB — BASIC METABOLIC PANEL
Anion gap: 13 (ref 5–15)
BUN: 31 mg/dL — ABNORMAL HIGH (ref 6–20)
CO2: 28 mmol/L (ref 22–32)
Calcium: 8.9 mg/dL (ref 8.9–10.3)
Chloride: 94 mmol/L — ABNORMAL LOW (ref 98–111)
Creatinine, Ser: 6.01 mg/dL — ABNORMAL HIGH (ref 0.61–1.24)
GFR, Estimated: 10 mL/min — ABNORMAL LOW (ref >60)
Glucose, Bld: 126 mg/dL — ABNORMAL HIGH (ref 70–99)
Potassium: 4.2 mmol/L (ref 3.5–5.1)
Sodium: 135 mmol/L (ref 135–145)

## 2021-07-07 LAB — HEMOGLOBIN A1C
Hgb A1c MFr Bld: 4.7 % — ABNORMAL LOW (ref 4.8–5.6)
Mean Plasma Glucose: 88.19 mg/dL

## 2021-07-07 LAB — HEPATITIS B SURFACE ANTIBODY,QUALITATIVE: Hep B S Ab: REACTIVE — AB

## 2021-07-07 LAB — HEPATITIS B SURFACE ANTIGEN: Hepatitis B Surface Ag: NONREACTIVE

## 2021-07-07 MED ORDER — PENTAFLUOROPROP-TETRAFLUOROETH EX AERO: 1.0000 "application " | INHALATION_SPRAY | CUTANEOUS | Status: DC | PRN

## 2021-07-07 MED ORDER — SODIUM CHLORIDE 0.9 % IV SOLN: 100.0000 mL | INTRAVENOUS | Status: DC | PRN

## 2021-07-07 MED ORDER — HEPARIN SODIUM (PORCINE) 1000 UNIT/ML DIALYSIS: 1000.0000 [IU] | INTRAMUSCULAR | Status: DC | PRN

## 2021-07-07 MED ORDER — LIDOCAINE HCL (PF) 1 % IJ SOLN: 5.0000 mL | INTRAMUSCULAR | Status: DC | PRN

## 2021-07-07 MED ORDER — LIDOCAINE-PRILOCAINE 2.5-2.5 % EX CREA: 1.0000 "application " | TOPICAL_CREAM | CUTANEOUS | Status: DC | PRN

## 2021-07-07 MED ORDER — SODIUM CHLORIDE 0.9 % IV SOLN
INTRAVENOUS | Status: DC | PRN
  Administered 2021-07-07: 250 mL via INTRAVENOUS

## 2021-07-07 NOTE — Progress Notes (Signed)
OT Cancellation Note  Patient Details Name: Brian [name] MRN: [id number] DOB: Nov 23, 1960   Cancelled Treatment:     OT order received, pt currently off the unit for HD. OT will continue follow and re attempt at a later time as time allows.   Minus Breeding, MSOT, OTR/L  [facility]  [phone]   [name] 07/07/2021, 9:10 AM

## 2021-07-07 NOTE — Progress Notes (Signed)
Patient ID: Brian [name], male   DOB: Jul 15, 1961, 60 y.o.   MRN: [id number] Patient is postoperative day 1 right transtibial amputation.  There is no drainage in the wound VAC canister patient denies any pain.  Plan for dialysis today plan for discharge to skilled nursing.  Patient received ketamine intraoperatively.

## 2021-07-07 NOTE — Procedures (Signed)
Patient seen on Hemodialysis. BP 121/79 (BP Location: Right Arm)   Pulse 65   Temp 99.6 F (37.6 C) (Oral)   Resp 15   Ht 5\' 9"  (1.753 m)   Wt 80.5 kg   SpO2 93%   BMI 26.21 kg/m   QB 400, UF goal 2L Tolerating treatment without complaints at this time- minimal R BKA stump site pain overnight.   Elmarie Shiley MD [facility]. Office # [phone] Pager # [phone] 9:28 AM

## 2021-07-07 NOTE — Progress Notes (Signed)
IP rehab admissions - I met with patient at the bedside.  He has just returned from HD.  PT/OT evaluations are pending.  I spoke with patient about his rehab options.  He is expecting to go to an outside facility (SNF) because he lives alone; two weeks is not sufficient time for him to rehab; and his sister and brother provide intermittent assistance.  He would like to pursue [facility] in [place].  Call me for questions.  [phone]

## 2021-07-07 NOTE — Progress Notes (Signed)
Physical Therapy Evaluation Patient Details Name: Brian [name] MRN: [id number] DOB: 1960-11-20 Today's Date: 07/07/2021  History of Present Illness  60 yo male with non union of R pilon was admitted for BK amputation on 9/30.  Pt had inability to walk in a wheelchair with RLE, and is complicated by ESRD and HD.  PMHx; cirrhosis, DM, PN, duodenal ulcer, ESRD, HD, stroke, tobacco abuse, portal HTN, hiatal hernia, kidney stones, stress ulcer, PVD  Clinical Impression  Pt was seen for mobility on side of bed to initially try to stand with two person help, then with two for transition to slide to chair.  Pt is a bit confused about his therapy goals, but spent time explaining about the process of healing and fitting a prosthesis.   Follow along with him to work on independence and safety with RW and sliding, as well as instruction of ex's and use of limb protector.  SNF recommended due to lack of help to return home and his limited physical independence at eval.      Recommendations for follow up therapy are one component of a multi-disciplinary discharge planning process, led by the attending physician.  Recommendations may be updated based on patient status, additional functional criteria and insurance authorization.  Follow Up Recommendations SNF    Equipment Recommendations  None recommended by PT    Recommendations for Other Services       Precautions / Restrictions Precautions Precautions: Fall Required Braces or Orthoses: Other Brace Other Brace: R limb protector Restrictions Weight Bearing Restrictions: Yes (Simultaneous filing. User may not have seen previous data.) RLE Weight Bearing: Non weight bearing      Mobility  Bed Mobility Overal bed mobility: Needs Assistance (Simultaneous filing. User may not have seen previous data.) Bed Mobility: Supine to Sit (Simultaneous filing. User may not have seen previous data.)     Supine to sit: Min guard (Simultaneous filing. User may  not have seen previous data.)     General bed mobility comments: No physical assistance required, increased time and effort with use of bed rail for trunk elevation and pt able to present BLE to EOB. (Simultaneous filing. User may not have seen previous data.)    Transfers Overall transfer level: Needs assistance (Simultaneous filing. User may not have seen previous data.) Equipment used: Rolling walker (2 wheeled) (Simultaneous filing. User may not have seen previous data.) Transfers: Sit to/from Stand;Lateral/Scoot Transfers (Simultaneous filing. User may not have seen previous data.) Sit to Stand: Max assist;+2 safety/equipment;+2 physical assistance (Simultaneous filing. User may not have seen previous data.)        Lateral/Scoot Transfers: Min assist;+2 safety/equipment (Simultaneous filing. User may not have seen previous data.) General transfer comment: Max A +2 with attempt to stand from EOB, min A +2 for safety with equipment for lateral scoot to drop arm recliner. (Simultaneous filing. User may not have seen previous data.)  Ambulation/Gait         Gait velocity: NA   General Gait Details: unable to step  Stairs            Wheelchair Mobility    Modified Rankin (Stroke Patients Only)       Balance Overall balance assessment: Needs assistance (Simultaneous filing. User may not have seen previous data.) Sitting-balance support: No upper extremity supported;Feet supported (Simultaneous filing. User may not have seen previous data.) Sitting balance-Leahy Scale: Fair (Simultaneous filing. User may not have seen previous data.)     Standing balance support: Bilateral upper  extremity supported (Simultaneous filing. User may not have seen previous data.) Standing balance-Leahy Scale: Poor (Simultaneous filing. User may not have seen previous data.) Standing balance comment: UE reliant of RW                             Pertinent Vitals/Pain Pain  Assessment: 0-10 (Simultaneous filing. User may not have seen previous data.) Pain Score: 2  Pain Location: RLE    Home Living Family/patient expects to be discharged to:: Inpatient rehab (Simultaneous filing. User may not have seen previous data.) Living Arrangements: Alone (Simultaneous filing. User may not have seen previous data.)   Type of Home: House (Simultaneous filing. User may not have seen previous data.) Home Access: Stairs to enter (Simultaneous filing. User may not have seen previous data.) Entrance Stairs-Rails: Can reach both (Simultaneous filing. User may not have seen previous data.) Entrance Stairs-Number of Steps: 4 (Simultaneous filing. User may not have seen previous data.) Home Layout: One level (Simultaneous filing. User may not have seen previous data.) Home Equipment: Walker - 2 wheels (Simultaneous filing. User may not have seen previous data.)      Prior Function Level of Independence: Independent with assistive device(s) (Simultaneous filing. User may not have seen previous data.)   Gait / Transfers Assistance Needed: RW initally then could not stand up at all after the fracture  ADL's / Homemaking Assistance Needed: wheelchair level  Comments: usually does sponge bathe; on disability,- confirmed from pt     Hand Dominance   Dominant Hand: Right (Simultaneous filing. User may not have seen previous data.)    Extremity/Trunk Assessment   Upper Extremity Assessment Upper Extremity Assessment: Generalized weakness (Simultaneous filing. User may not have seen previous data.)    Lower Extremity Assessment Lower Extremity Assessment: Defer to PT evaluation (Simultaneous filing. User may not have seen previous data.) RLE Deficits / Details: BK amputation with weakness from pain RLE: Unable to fully assess due to pain RLE Coordination: decreased gross motor    Cervical / Trunk Assessment Cervical / Trunk Assessment: Kyphotic (mild)  Communication    Communication: No difficulties (Simultaneous filing. User may not have seen previous data.)  Cognition Arousal/Alertness: Awake/alert (Simultaneous filing. User may not have seen previous data.) Behavior During Therapy: [facility] for tasks assessed/performed (Simultaneous filing. User may not have seen previous data.) Overall Cognitive Status: Within Functional Limits for tasks assessed (Simultaneous filing. User may not have seen previous data.)                                 General Comments: pt did not explain and family did not elaborate if he is not at baseline      General Comments General comments (skin integrity, edema, etc.): wound vac, concerns of nausea reported to RN. (Simultaneous filing. User may not have seen previous data.)    Exercises     Assessment/Plan    PT Assessment Patient needs continued PT services  PT Problem List Decreased strength;Decreased range of motion;Decreased activity tolerance;Decreased balance;Decreased mobility;Decreased coordination;Decreased cognition;Decreased knowledge of use of DME;Decreased safety awareness;Cardiopulmonary status limiting activity;Decreased skin integrity;Pain;Decreased knowledge of precautions       PT Treatment Interventions DME instruction;Gait training;Stair training;Functional mobility training;Therapeutic activities;Therapeutic exercise;Balance training;Neuromuscular re-education;Patient/family education    PT Goals (Current goals can be found in the Care Plan section)  Acute Rehab PT Goals Patient Stated Goal: to walk  and to drive (Simultaneous filing. User may not have seen previous data.) PT Goal Formulation: With patient Time For Goal Achievement: 07/21/21 Potential to Achieve Goals: Good    Frequency Min 3X/week   Barriers to discharge Decreased caregiver support;Inaccessible home environment family in at times, has 4 STE    Co-evaluation PT/OT/SLP Co-Evaluation/Treatment: Yes Reason for  Co-Treatment: Complexity of the patient's impairments (multi-system involvement);For patient/therapist safety;To address functional/ADL transfers PT goals addressed during session: Mobility/safety with mobility;Proper use of DME;Balance         AM-PAC PT "6 Clicks" Mobility  Outcome Measure Help needed turning from your back to your side while in a flat bed without using bedrails?: A Little Help needed moving from lying on your back to sitting on the side of a flat bed without using bedrails?: A Little Help needed moving to and from a bed to a chair (including a wheelchair)?: A Lot Help needed standing up from a chair using your arms (e.g., wheelchair or bedside chair)?: Total Help needed to walk in hospital room?: Total Help needed climbing 3-5 steps with a railing? : Total 6 Click Score: 11    End of Session Equipment Utilized During Treatment: Gait belt Activity Tolerance: Patient limited by fatigue;Patient limited by pain;Treatment limited secondary to agitation Patient left: in chair;with call bell/phone within reach;with chair alarm set;with family/visitor present Nurse Communication: Mobility status;Other (comment) (options to return to bed) PT Visit Diagnosis: Muscle weakness (generalized) (M62.81);Pain;Difficulty in walking, not elsewhere classified (R26.2);Other abnormalities of gait and mobility (R26.89) Pain - Right/Left: Right Pain - part of body: Leg    Time: 1445-1516 PT Time Calculation (min) (ACUTE ONLY): 31 min   Charges:   PT Evaluation $PT Eval Moderate Complexity: 1 Mod         Ramond Dial 07/07/2021, 4:52 PM  Mee Hives, PT MS Acute Rehab Dept. Number: [place] [facility] [place]

## 2021-07-07 NOTE — Progress Notes (Signed)
Inpatient Diabetes Program Recommendations  AACE/ADA: New Consensus Statement on Inpatient Glycemic Control (2015)  Target Ranges:  Prepandial:   less than 140 mg/dL      Peak postprandial:   less than 180 mg/dL (1-2 hours)      Critically ill patients:  140 - 180 mg/dL   Lab Results  Component Value Date   GLUCAP 119 (H) 07/07/2021   HGBA1C 4.7 (L) 07/07/2021    Review of Glycemic Control  Diabetes history: DM2 Outpatient Diabetes medications: 70/30 5 units q hs prn for elevated CBGs Current orders for Inpatient glycemic control: Novolog 0-15 units tid  Inpatient Diabetes Program Recommendations:   Noted A1c 4.7 which indicates very low CBGs. -Consider decrease in Novolog correction to 0-6 units tid Secure chat sent to Dr. Sharol Given.  Thank you, Nani Gasser. Timberlee Roblero, RN, MSN, CDE  Diabetes Coordinator Inpatient Glycemic Control Team Team Pager [phone] (8am-5pm) 07/07/2021 9:57 AM

## 2021-07-07 NOTE — Progress Notes (Signed)
Occupational Therapy Evaluation Patient Details Name: Brian [name] MRN: [id number] DOB: 1961/08/11 Today's Date: 07/07/2021   History of Present Illness 60 yo male with non union of R pilon was admitted for BK amputation on 9/30.  Pt had inability to walk in a wheelchair with RLE, and is complicated by ESRD and HD.  PMHx; cirrhosis, DM, PN, duodenal ulcer, ESRD, HD, stroke, tobacco abuse, portal HTN, hiatal hernia, kidney stones, stress ulcer, PVD   Clinical Impression   Pt presents with above diagnosis. PTA pt PLOF living at home alone mostly I to Mod I with ADLs, assistance from family with IADLs. Pt reports use of AE, such RW for functional and community mobility. Pt is currently limited with safe ADL engagement due to pain, impaired strength, safety awareness, and decreased functional mobility. Pt will benefit to continue skilled level OT in acute and SNF rehab for education of AE, compensatory strategies, safety awareness, HEP, and functional transfer to maximize independence prior to DC to return home safely and to relieve caregiver burden.        Recommendations for follow up therapy are one component of a multi-disciplinary discharge planning process, led by the attending physician.  Recommendations may be updated based on patient status, additional functional criteria and insurance authorization.   Follow Up Recommendations  SNF    Equipment Recommendations   (defer to post acute rehab setting.)    Recommendations for Other Services       Precautions / Restrictions Precautions Precautions: Fall Required Braces or Orthoses: Other Brace Other Brace: R limb protector Restrictions Weight Bearing Restrictions: Yes (Simultaneous filing. User may not have seen previous data.) RLE Weight Bearing: Non weight bearing      Mobility Bed Mobility Overal bed mobility: Needs Assistance (Simultaneous filing. User may not have seen previous data.) Bed Mobility: Supine to Sit     Supine  to sit: Min guard   General bed mobility comments: No physical assistance required, increased time and effort with use of bed rail for trunk elevation and pt able to present BLE to EOB.  Transfers Overall transfer level: Needs assistance  Equipment used: Rolling walker (2 wheeled)  Transfers: Sit to/from Stand;Lateral/Scoot Transfers  Sit to Stand: Max assist;+2 safety/equipment;+2 physical assistance        Lateral/Scoot Transfers: Min assist;+2 safety/equipment  General transfer comment: Max A +2 with attempt to stand from EOB, min A +2 for safety with equipment for lateral scoot to drop arm recliner.    Balance Overall balance assessment: Needs assistance   Sitting-balance support: No upper extremity supported;Feet supported  Sitting balance-Leahy Scale: Fair     Standing balance support: Bilateral upper extremity supported   Standing balance-Leahy Scale: Poor  Standing balance comment: UE reliant of RW                           ADL either performed or assessed with clinical judgement   ADL Overall ADL's : Needs assistance/impaired Eating/Feeding: Set up;Sitting   Grooming: Wash/dry hands;Wash/dry face;Oral care;Set up;Sitting           Upper Body Dressing : Set up;Sitting   Lower Body Dressing: Modified independent Lower Body Dressing Details (indicate cue type and reason): mod I for seated LB dressingm, will anticipate increased assistance for standing LB dressing due to weakness with standing. Toilet Transfer: Maximal assistance;+2 for physical assistance;Cueing for safety;Cueing for sequencing;RW Toilet Transfer Details (indicate cue type and reason): 2 attempts to power up  to stand with RW, however, limited with LLE and BUE with use of RW to stand up right even with bed elevated. Toileting- Clothing Manipulation and Hygiene: Maximal assistance Toileting - Clothing Manipulation Details (indicate cue type and reason): Will infer assistance needed for  sitting/leaning and standing balance for pericare/clothing management.             Vision         Perception     Praxis      Pertinent Vitals/Pain Pain Assessment: 0-10 (Simultaneous filing. User may not have seen previous data.) Pain Score: 2  Pain Location: RLE     Hand Dominance Right (Simultaneous filing. User may not have seen previous data.)   Extremity/Trunk Assessment Upper Extremity Assessment Upper Extremity Assessment: Generalized weakness (Simultaneous filing. User may not have seen previous data.)   Lower Extremity Assessment Lower Extremity Assessment: Defer to PT evaluation (Simultaneous filing. User may not have seen previous data.) RLE Deficits / Details: BK amputation with weakness from pain RLE: Unable to fully assess due to pain RLE Coordination: decreased gross motor   Cervical / Trunk Assessment Cervical / Trunk Assessment: Kyphotic (mild)   Communication Communication Communication: No difficulties (Simultaneous filing. User may not have seen previous data.)   Cognition Arousal/Alertness: Awake/alert (Simultaneous filing. User may not have seen previous data.) Behavior During Therapy: [facility] for tasks assessed/performed (Simultaneous filing. User may not have seen previous data.) Overall Cognitive Status: Within Functional Limits for tasks assessed (Simultaneous filing. User may not have seen previous data.)                                 General Comments: pt did not explain and family did not elaborate if he is not at baseline   General Comments  wound vac, concerns of nausea reported to RN.     Exercises     Shoulder Instructions      Home Living Family/patient expects to be discharged to:: Inpatient rehab  Living Arrangements: Alone    Type of Home: House  Home Access: Stairs to enter  Entrance Stairs-Number of Steps: 4  Entrance Stairs-Rails: Can reach both  Home Layout: One level     Bathroom Shower/Tub: Arts administrator: StandardBathroom Accessibility: Yes  How Accessible: Accessible via walker Home Equipment: [place] - 2 wheels          Prior Functioning/Environment Level of Independence: Independent with assistive device(s) (Simultaneous filing. User may not have seen previous data.)  Gait / Transfers Assistance Needed: RW initally then could not stand up at all after the fracture ADL's / Homemaking Assistance Needed: wheelchair level   Comments: usually does sponge bathe; on disability,- confirmed from pt        OT Problem List: Decreased strength;Decreased range of motion;Decreased activity tolerance;Impaired balance (sitting and/or standing);Decreased safety awareness;Decreased knowledge of use of DME or AE;Decreased knowledge of precautions;Pain      OT Treatment/Interventions: Self-care/ADL training;Therapeutic exercise;DME and/or AE instruction;Therapeutic activities;Patient/family education;Balance training    OT Goals(Current goals can be found in the care plan section) Acute Rehab OT Goals Patient Stated Goal: to walk and to drive (Simultaneous filing. User may not have seen previous data.) OT Goal Formulation: With patient Time For Goal Achievement: 07/21/21 Potential to Achieve Goals: Good  OT Frequency: Min 2X/week   Barriers to D/C:            Co-evaluation   Reason  for Co-Treatment: Complexity of the patient's impairments (multi-system involvement);For patient/therapist safety;To address functional/ADL transfers PT goals addressed during session: Mobility/safety with mobility;Proper use of DME;Balance        AM-PAC OT "6 Clicks" Daily Activity     Outcome Measure Help from another person eating meals?: None Help from another person taking care of personal grooming?: A Little Help from another person toileting, which includes using toliet, bedpan, or urinal?: A Lot Help from another person bathing (including washing, rinsing, drying)?: A Lot Help  from another person to put on and taking off regular upper body clothing?: A Little Help from another person to put on and taking off regular lower body clothing?: A Lot 6 Click Score: 16   End of Session Equipment Utilized During Treatment: Gait belt;Rolling walker Nurse Communication: Mobility status  Activity Tolerance: Patient tolerated treatment well Patient left: in chair;with call bell/phone within reach;with chair alarm set;with family/visitor present  OT Visit Diagnosis: Unsteadiness on feet (R26.81);Muscle weakness (generalized) (M62.81);History of falling (Z91.81);Pain Pain - Right/Left: Right Pain - part of body: Leg                Time: 1445-1515 OT Time Calculation (min): 30 min Charges:  OT General Charges $OT Visit: 1 Visit OT Evaluation $OT Eval Low Complexity: [place], MSOT, OTR/L  Supplemental Rehabilitation Services  [phone]   [name] 07/07/2021, 4:52 PM

## 2021-07-07 NOTE — Plan of Care (Signed)

## 2021-07-07 NOTE — Progress Notes (Signed)
PT Cancellation Note  Patient Details Name: Brian [name] MRN: [id number] DOB: 1961-06-20   Cancelled Treatment:    Reason Eval/Treat Not Completed: Patient at procedure or test/unavailable.  In HD, reattempt as time and pt allow.   Ramond Dial 07/07/2021, 10:10 AM Mee Hives, PT MS Acute Rehab Dept. Number: [place] and [place]

## 2021-07-08 LAB — BASIC METABOLIC PANEL
Anion gap: 11 (ref 5–15)
BUN: 20 mg/dL (ref 6–20)
CO2: 28 mmol/L (ref 22–32)
Calcium: 8.8 mg/dL — ABNORMAL LOW (ref 8.9–10.3)
Chloride: 97 mmol/L — ABNORMAL LOW (ref 98–111)
Creatinine, Ser: 4.4 mg/dL — ABNORMAL HIGH (ref 0.61–1.24)
GFR, Estimated: 15 mL/min — ABNORMAL LOW (ref >60)
Glucose, Bld: 92 mg/dL (ref 70–99)
Potassium: 4 mmol/L (ref 3.5–5.1)
Sodium: 136 mmol/L (ref 135–145)

## 2021-07-08 LAB — CBC
HCT: 33.2 % — ABNORMAL LOW (ref 39.0–52.0)
Hemoglobin: 11.2 g/dL — ABNORMAL LOW (ref 13.0–17.0)
MCH: 33.3 pg (ref 26.0–34.0)
MCHC: 33.7 g/dL (ref 30.0–36.0)
MCV: 98.8 fL (ref 80.0–100.0)
Platelets: 151 10*3/uL (ref 150–400)
RBC: 3.36 MIL/uL — ABNORMAL LOW (ref 4.22–5.81)
RDW: 16.3 % — ABNORMAL HIGH (ref 11.5–15.5)
WBC: 11.8 10*3/uL — ABNORMAL HIGH (ref 4.0–10.5)
nRBC: 0 % (ref 0.0–0.2)

## 2021-07-08 LAB — GLUCOSE, CAPILLARY
Glucose-Capillary: 68 mg/dL — ABNORMAL LOW (ref 70–99)
Glucose-Capillary: 93 mg/dL (ref 70–99)
Glucose-Capillary: 114 mg/dL — ABNORMAL HIGH (ref 70–99)
Glucose-Capillary: 111 mg/dL — ABNORMAL HIGH (ref 70–99)

## 2021-07-08 LAB — HEPATITIS B SURFACE ANTIBODY, QUANTITATIVE: Hep B S AB Quant (Post): 49.2 m[IU]/mL (ref >9.9)

## 2021-07-08 MED ORDER — PROSOURCE PLUS PO LIQD
30.0000 mL | Freq: Three times a day (TID) | ORAL | Status: DC
  Administered 2021-07-08 – 2021-07-10 (×6): 30 mL via ORAL
  Filled 2021-07-08 (×6): qty 30

## 2021-07-08 MED ORDER — RENA-VITE PO TABS
1.0000 | ORAL_TABLET | Freq: Every day | ORAL | Status: DC
  Administered 2021-07-08 – 2021-07-09 (×2): 1 via ORAL
  Filled 2021-07-08 (×2): qty 1

## 2021-07-08 NOTE — TOC Initial Note (Signed)
Transition of Care [facility]) - Initial/Assessment Note    Patient Details  Name: Brian [name] MRN: [id number] Date of Birth: 1961/10/03  Transition of Care [facility]) CM/SW Contact:    Ina Homes, [place] Phone Number: 07/08/2021, 10:59 AM  Clinical Narrative:                  SW met with pt at bedside to discuss recs for SNF. Pt agreeable. SW provided list of SNF's in pt area. Pt prefers [facility] or [facility]. Pt refuses [facility] or [facility], states "I'd go back home before I go there."   Pt reports has r/w at home and does not drive. Pt relies on public transport for people with disabilities or brother in law. Pt reports 2 doses of Moderna COVID vaccine. Pt confirms attends [facility] M[facility] for HD.  SNF referrals sent out. PASRR active  Expected Discharge Plan: [place] Barriers to Discharge: Continued Medical Work up, SNF Pending bed offer   Patient Goals and CMS Choice Patient states their goals for this hospitalization and ongoing recovery are:: return home CMS Medicare.gov Compare Post Acute Care list provided to:: Patient Choice offered to / list presented to : Patient  Expected Discharge Plan and Services Expected Discharge Plan: [place] Choice: [place] arrangements for the past 2 months: Single Family Home                                      Prior Living Arrangements/Services Living arrangements for the past 2 months: Single Family Home Lives with:: Self Patient language and need for interpreter reviewed:: Yes Do you feel safe going back to the place where you live?: Yes               Activities of Daily Living Home Assistive Devices/Equipment: Environmental consultant (specify type), Cane (specify quad or straight), Eyeglasses, CBG Meter, Blood pressure cuff ADL Screening (condition at time of admission) Patient's cognitive ability adequate to safely complete daily activities?: Yes Is the  patient deaf or have difficulty hearing?: No Does the patient have difficulty seeing, even when wearing glasses/contacts?: No Does the patient have difficulty concentrating, remembering, or making decisions?: No Patient able to express need for assistance with ADLs?: Yes Does the patient have difficulty dressing or bathing?: No Independently performs ADLs?: Yes (appropriate for developmental age) Does the patient have difficulty walking or climbing stairs?: Yes (due to right ankle) Weakness of Legs: Both Weakness of Arms/Hands: Both  Permission Sought/Granted Permission sought to share information with : Case Manager, Customer service manager Permission granted to share information with : Yes, Verbal Permission Granted  Share Information with NAME: Lia Foyer  Permission granted to share info w AGENCY: SNF's  Permission granted to share info w Relationship: Sister  Permission granted to share info w Contact Information: [phone]  Emotional Assessment Appearance:: Appears stated age Attitude/Demeanor/Rapport: Engaged Affect (typically observed): Accepting Orientation: : Oriented to Self, Oriented to Place, Oriented to  Time, Oriented to Situation      Admission diagnosis:  Closed right ankle fracture, with nonunion, subsequent encounter [S82.891K] Patient Active Problem List   Diagnosis Date Noted   Pressure injury of skin 07/07/2021   Closed right ankle fracture, with nonunion, subsequent encounter 07/06/2021   Charcot's joint of right ankle    Personal history of diabetic foot ulcer 04/11/2020  Diabetic infection of right foot ([place]) 03/26/2020   Headache, unspecified 09/10/2019   Diarrhea, unspecified 08/16/2019   Encounter for removal of sutures 07/28/2018   Cryptogenic cirrhosis ([place]) 07/16/2018   Encounter for immunization 07/01/2018   Gastritis 01/23/2018   Acute blood loss anemia 01/23/2018   Duodenal ulcer    Upper GI bleed 01/22/2018   History of CVA  (cerebrovascular accident) 01/15/2018   Iron deficiency anemia, unspecified 11/25/2017   Coagulation defect, unspecified ([place]) 11/18/2017   Unspecified protein-calorie malnutrition ([place]) 11/11/2017   Acquired absence of spleen 10/30/2017   Nicotine dependence, unspecified, uncomplicated 91/44/4584   Other disorders of phosphorus metabolism 10/30/2017   Unspecified nephritic syndrome with unspecified morphologic changes 10/30/2017   Anemia in chronic kidney disease 10/24/2017   Dependence on renal dialysis ([place]) 10/24/2017   Encounter for screening for respiratory tuberculosis 10/24/2017   Pruritus, unspecified 10/24/2017   Secondary hyperparathyroidism of renal origin ([place]) 10/24/2017   Shortness of breath 10/24/2017   Type 1 diabetes mellitus with kidney complication ([place]) - on insulin 05/01/2017   Hemorrhagic stroke [place])- Jan 2010 05/01/2017   H/O hyperlipidemia- sev yrs ago 05/01/2017   h/o Hypertriglyceridemia- 13000's in past 05/01/2017   H/O splenectomy 05/01/2017   Status post cholecystectomy 05/01/2017   Gout- L foot- mult times 05/01/2017   Diabetic neuropathy ([place])- 6 mo or so; never started meds 05/01/2017   Vitamin D deficiency 05/01/2017   ESRD on dialysis ([place]) 02/05/2017   Anasarca- may 2018 hosp 02/05/2017   Leukocytosis- s/p plenectomy 02/05/2017   Hypertension    Diabetes mellitus without complication (HCC)    Tobacco abuse- >er than 55 pack yr hx; current smoker    PCP:  [name], MD Pharmacy:   [facility] [place] [address] [place] ([place]) [place] [address] Phone: [phone] Fax: [phone]     Social Determinants of Health (SDOH) Interventions    Readmission Risk Interventions No flowsheet data found.

## 2021-07-08 NOTE — NC FL2 (Signed)
[place] LEVEL OF CARE SCREENING TOOL     IDENTIFICATION  Patient Name: Brian [name] Birthdate: Feb 06, 1961 Sex: male Admission Date (Current Location): 07/06/2021  [place] and [place] Number:  Kathleen Argue [id number] [place] and Address:  [facility], [place] [address], [place], [place] [address]      Provider Number: [id number]  Attending Physician Name and Address:  Newt Minion, MD  Relative Name and Phone Number:  Louisa Second [phone]    Current Level of Care: Hospital Recommended Level of Care: [place] Prior Approval Number:    Date Approved/Denied:   PASRR Number: [id number]  Discharge Plan: SNF    Current Diagnoses: Patient Active Problem List   Diagnosis Date Noted   Pressure injury of skin 07/07/2021   Closed right ankle fracture, with nonunion, subsequent encounter 07/06/2021   Charcot's joint of right ankle    Personal history of diabetic foot ulcer 04/11/2020   Diabetic infection of right foot ([place]) 03/26/2020   Headache, unspecified 09/10/2019   Diarrhea, unspecified 08/16/2019   Encounter for removal of sutures 07/28/2018   Cryptogenic cirrhosis ([place]) 07/16/2018   Encounter for immunization 07/01/2018   Gastritis 01/23/2018   Acute blood loss anemia 01/23/2018   Duodenal ulcer    Upper GI bleed 01/22/2018   History of CVA (cerebrovascular accident) 01/15/2018   Iron deficiency anemia, unspecified 11/25/2017   Coagulation defect, unspecified ([place]) 11/18/2017   Unspecified protein-calorie malnutrition ([place]) 11/11/2017   Acquired absence of spleen 10/30/2017   Nicotine dependence, unspecified, uncomplicated 62/12/5595   Other disorders of phosphorus metabolism 10/30/2017   Unspecified nephritic syndrome with unspecified morphologic changes 10/30/2017   Anemia in chronic kidney disease 10/24/2017   Dependence on renal dialysis ([place]) 10/24/2017   Encounter for screening for respiratory  tuberculosis 10/24/2017   Pruritus, unspecified 10/24/2017   Secondary hyperparathyroidism of renal origin ([place]) 10/24/2017   Shortness of breath 10/24/2017   Type 1 diabetes mellitus with kidney complication ([place]) - on insulin 05/01/2017   Hemorrhagic stroke ([place])- Jan 2010 05/01/2017   H/O hyperlipidemia- sev yrs ago 05/01/2017   h/o Hypertriglyceridemia- 13000's in past 05/01/2017   H/O splenectomy 05/01/2017   Status post cholecystectomy 05/01/2017   Gout- L foot- mult times 05/01/2017   Diabetic neuropathy ([place])- 6 mo or so; never started meds 05/01/2017   Vitamin D deficiency 05/01/2017   ESRD on dialysis ([place]) 02/05/2017   Anasarca- may 2018 hosp 02/05/2017   Leukocytosis- s/p plenectomy 02/05/2017   Hypertension    Diabetes mellitus without complication (HCC)    Tobacco abuse- >er than 55 pack yr hx; current smoker     Orientation RESPIRATION BLADDER Height & Weight     Self, Time, Situation, Place  Normal Continent Weight: 174 lb 2.6 oz (79 kg) Height:  5\' 9"  (175.3 cm)  BEHAVIORAL SYMPTOMS/MOOD NEUROLOGICAL BOWEL NUTRITION STATUS      Continent Diet (see discharge summary)  AMBULATORY STATUS COMMUNICATION OF NEEDS Skin   Limited Assist Verbally Other (Comment), Surgical wounds, Wound Vac (s/p rt BKA with wound vac; stage 2 pressure injury)                       Personal Care Assistance Level of Assistance  Bathing, Dressing Bathing Assistance: Limited assistance   Dressing Assistance: Limited assistance     Functional Limitations Info             [place]  PT (By licensed PT),  OT (By licensed OT)     PT Frequency: per facility OT Frequency: per facility            Contractures      Additional Factors Info  Code Status Code Status Info: FULL             Current Medications (07/08/2021):  This is the current hospital active medication list Current Facility-Administered Medications  Medication Dose Route Frequency  Provider Last Rate Last Admin   0.9 %  sodium chloride infusion   Intravenous Continuous Persons, Bevely Palmer, PA       0.9 %  sodium chloride infusion   Intravenous PRN Newt Minion, MD   Stopped at 07/07/21 1630   acetaminophen (TYLENOL) tablet 325-650 mg  325-650 mg Oral Q6H PRN Persons, Bevely Palmer, PA   650 mg at 07/07/21 0820   alum & mag hydroxide-simeth (MAALOX/MYLANTA) 200-200-20 MG/5ML suspension 15-30 mL  15-30 mL Oral Q2H PRN Persons, Bevely Palmer, PA       amLODipine (NORVASC) tablet 10 mg  10 mg Oral Daily Persons, Bevely Palmer, PA   10 mg at 07/07/21 1358   bisacodyl (DULCOLAX) EC tablet 5 mg  5 mg Oral Daily PRN Persons, Bevely Palmer, PA       docusate sodium (COLACE) capsule 100 mg  100 mg Oral Daily Persons, Bevely Palmer, PA   100 mg at 07/07/21 1358   [START ON 07/09/2021] doxercalciferol (HECTOROL) injection 6 mcg  6 mcg Intravenous Q M,W,F-HD Loren Racer, PA-C       guaiFENesin-dextromethorphan (ROBITUSSIN DM) 100-10 MG/5ML syrup 15 mL  15 mL Oral Q4H PRN Persons, Bevely Palmer, PA       HYDROmorphone (DILAUDID) injection 0.5-1 mg  0.5-1 mg Intravenous Q4H PRN Persons, Bevely Palmer, PA       insulin aspart (novoLOG) injection 0-15 Units  0-15 Units Subcutaneous TID WC Persons, Bevely Palmer, PA       magnesium sulfate IVPB 2 g 50 mL  2 g Intravenous Daily PRN Persons, Bevely Palmer, PA       nadolol (CORGARD) tablet 40 mg  40 mg Oral Daily Persons, Bevely Palmer, PA   40 mg at 07/07/21 1358   nutrition supplement (JUVEN) (JUVEN) powder packet 1 packet  1 packet Oral BID BM Persons, Bevely Palmer, PA       ondansetron [facility]) injection 4 mg  4 mg Intravenous Q6H PRN Persons, Bevely Palmer, PA   4 mg at 07/07/21 1630   oxyCODONE (Oxy IR/ROXICODONE) immediate release tablet 10-15 mg  10-15 mg Oral Q4H PRN Persons, Bevely Palmer, PA       oxyCODONE (Oxy IR/ROXICODONE) immediate release tablet 5-10 mg  5-10 mg Oral Q4H PRN Persons, Bevely Palmer, PA       pantoprazole (PROTONIX) EC tablet 40 mg  40 mg Oral QPM Persons, Bevely Palmer, PA   40 mg at 07/07/21 1400   phenol (CHLORASEPTIC) mouth spray 1 spray  1 spray Mouth/Throat PRN Persons, Bevely Palmer, PA       polyethylene glycol (MIRALAX / GLYCOLAX) packet 17 g  17 g Oral Daily PRN Persons, Bevely Palmer, PA       potassium chloride SA (KLOR-CON) CR tablet 20-40 mEq  20-40 mEq Oral Daily PRN Persons, Bevely Palmer, PA       sevelamer carbonate (RENVELA) tablet 1,600 mg  1,600 mg Oral TID with meals Persons, Bevely Palmer, PA   1,600 mg at 07/08/21 0743   zinc sulfate capsule  220 mg  220 mg Oral Daily Persons, Bevely Palmer, [place]   220 mg at 07/07/21 1358     Discharge Medications: Please see discharge summary for a list of discharge medications.  Relevant Imaging Results:  Relevant Lab Results:   Additional Information SSN# [id number]; HD [facility] MWF  [facility], [place]

## 2021-07-08 NOTE — Progress Notes (Signed)
IP rehab admissions - Noted PT/OT recommending SNF.  Noted SW working on SNF placement.  I will sign off for CIR at this time.  Call for questions.  [phone]

## 2021-07-08 NOTE — Progress Notes (Signed)
Subjective: No a.m. complaints, HD yesterday off schedule secondary to amputation surgery admit, said tolerated about 1.5 L UF  Objective Vital signs in last 24 hours: Vitals:   07/07/21 1344 07/07/21 2129 07/08/21 0524 07/08/21 0913  BP: 130/82 134/81 (!) 143/85 135/85  Pulse: 67 65 64 65  Resp: 16 16  16   Temp: 98.8 F (37.1 C) 98.1 F (36.7 C) 98.8 F (37.1 C) 98.2 F (36.8 C)  TempSrc: Oral Oral Oral Oral  SpO2: 100% 94% 94% 95%  Weight:      Height:       Weight change: 0.3 kg  Physical Exam: General: Alert adult male O x3, NAD Heart: RRR no MRG Lungs: CTA bilaterally nonlabored breathing Abdomen: NABS, soft NTND Extremities: Wound VAC in place R transtibial amputation, left lower extremity no edema Dialysis Access: Positive bruit left arm AV fistula   OP dialysis Orders:  MWF at [facility] - last 9/28 4hr, 400/800, EDW 80.5kg, 2K/2Ca, AVF, heparin 3000 bolus - No ESA - Hectoral 92mcg IV q HD   Problem/Plan:  Nonunion of R tibia/fibula Fx s/p R BKA on 9/30: Dr. Sharol Given consulted 9/30 right transtibial amputation, wound VAC, for NHP, no current antibiotics  ESRD:MWF schedule -HD yesterday off schedule secondary surgery, a.m. K4.0, next HD tomorrow on schedule 10/3  Hypertension/volume: BP stable controlled, no edema on exam. UF as tolerated.  Slightly lower dry weight at time of discharge  Anemia: Hgb 11.2, no ESA needed follow-up trend   Metabolic bone disease: Calcium stable, check renal panel predialysis tomorrow continue home binders/VDRA.  Nutrition: Currently on carb modified diet will change to renal carb modified, check albumin in a.m. predialysis start protein supplements for healing.  T2DM  Ernest Haber, PA-C [facility] Beeper [phone] 07/08/2021,9:41 AM  LOS: 2 days   Labs: Basic Metabolic Panel: Recent Labs  Lab 07/06/21 0954 07/07/21 0326 07/08/21 0255  NA 135 135 136  K 3.9 4.2 4.0  CL 97* 94* 97*  CO2  --  28 28   GLUCOSE 85 126* 92  BUN 34* 31* 20  CREATININE 5.40* 6.01* 4.40*  CALCIUM  --  8.9 8.8*   Liver Function Tests: No results for input(s): AST, ALT, ALKPHOS, BILITOT, PROT, ALBUMIN in the last 168 hours. No results for input(s): LIPASE, AMYLASE in the last 168 hours. No results for input(s): AMMONIA in the last 168 hours. CBC: Recent Labs  Lab 07/06/21 0954 07/07/21 0326 07/08/21 0255  WBC  --  10.2 11.8*  HGB 14.3 12.1* 11.2*  HCT 42.0 36.8* 33.2*  MCV  --  99.5 98.8  PLT  --  206 151   Cardiac Enzymes: No results for input(s): CKTOTAL, CKMB, CKMBINDEX, TROPONINI in the last 168 hours. CBG: Recent Labs  Lab 07/06/21 2234 07/07/21 0604 07/07/21 1349 07/07/21 2117 07/08/21 0741  GLUCAP 122* 119* 97 98 68*    Studies/Results: No results found. Medications:  sodium chloride     sodium chloride Stopped (07/07/21 1630)   magnesium sulfate bolus IVPB      (feeding supplement) PROSource Plus  30 mL Oral TID BM   amLODipine  10 mg Oral Daily   docusate sodium  100 mg Oral Daily   [START ON 07/09/2021] doxercalciferol  6 mcg Intravenous Q M,W,F-HD   insulin aspart  0-15 Units Subcutaneous TID WC   multivitamin  1 tablet Oral QHS   nadolol  40 mg Oral Daily   pantoprazole  40 mg Oral QPM   sevelamer  carbonate  1,600 mg Oral TID with meals   zinc sulfate  220 mg Oral Daily

## 2021-07-08 NOTE — Progress Notes (Signed)
Initial Nutrition Assessment  DOCUMENTATION CODES:   Not applicable  INTERVENTION:   -30 ml Prosource Plus TID, each supplement provides 100 kcals and 15 grams protein -Renal MVI daily -D/c Juven  NUTRITION DIAGNOSIS:   Increased nutrient needs related to post-op healing as evidenced by estimated needs.  GOAL:   Patient will meet greater than or equal to 90% of their needs  MONITOR:   PO intake, Supplement acceptance, Labs, Weight trends, Skin, I & O's  REASON FOR ASSESSMENT:   Malnutrition Screening Tool    ASSESSMENT:   Patient is a 60 year old gentleman who has had a nonunion of a pilon fracture on the right he still has instability ambulating in a wheelchair.  Patient is on dialysis with end-stage renal disease secondary to diabetes.  Pt admitted with chronic nonunion rt ankle fracture.   9/30- s/p PROCEDURE: Transtibial amputation Application of Prevena wound VAC  Reviewed I/O's: -1.4 L x 24 hours and -1.4 L since admission   Pt unavailable at time of visit. Attempted to speak with pt via call to hospital room phone, however, unable to reach. RD unable to obtain further nutrition-related history or complete nutrition-focused physical exam at this time.    Per orthopedics notes, pt at high risk for wound dehiscence with internal fixation, so amputation was pursued.   Medications reviewed and include colace, renvela, and zinc sulfate.  Reviewed wt hx; pt has experienced a 3.2% wt loss over the past 3 months, which is not significant for time frame. Also suspect wt loss secondary to amputation. Per nephrology notes, EDW 80.5 kg.   [facility] is on national backorder, so will d/c due to short supply.   Per TOC notes, plan for SNF placement once medically stable.   Lab Results  Component Value Date   HGBA1C 4.7 (L) 07/07/2021   PTA DM medications are 5 units insulin aspart protamine-aspart.   Labs reviewed: CBGS: 68-98 (inpatient orders for glycemic control are  0-15 units insulin aspart TID with meals).    Diet Order:   Diet Order             Diet Carb Modified Fluid consistency: Thin; Room service appropriate? Yes  Diet effective now                   EDUCATION NEEDS:   No education needs have been identified at this time  Skin:  Skin Assessment: Skin Integrity Issues: Skin Integrity Issues:: Wound VAC Wound Vac: s/p rt BKA  Last BM:  07/05/21  Height:   Ht Readings from Last 1 Encounters:  07/06/21 5\' 9"  (1.753 m)    Weight:   Wt Readings from Last 1 Encounters:  07/07/21 79 kg    Ideal Body Weight:  68 kg (adjusted for rt BKA)  BMI:  Body mass index is 25.72 kg/m.  Estimated Nutritional Needs:   Kcal:  1950-2150  Protein:  105-120 grams  Fluid:  1000 ml + UOP    Loistine Chance, RD, LDN, [place] Registered Dietitian II Certified Diabetes Care and Education Specialist Please refer to [facility] for RD and/or RD on-call/weekend/after hours pager

## 2021-07-09 LAB — CBC
HCT: 33.8 % — ABNORMAL LOW (ref 39.0–52.0)
Hemoglobin: 11.5 g/dL — ABNORMAL LOW (ref 13.0–17.0)
MCH: 33.6 pg (ref 26.0–34.0)
MCHC: 34 g/dL (ref 30.0–36.0)
MCV: 98.8 fL (ref 80.0–100.0)
Platelets: 169 10*3/uL (ref 150–400)
RBC: 3.42 MIL/uL — ABNORMAL LOW (ref 4.22–5.81)
RDW: 16.4 % — ABNORMAL HIGH (ref 11.5–15.5)
WBC: 10.8 10*3/uL — ABNORMAL HIGH (ref 4.0–10.5)
nRBC: 0 % (ref 0.0–0.2)

## 2021-07-09 LAB — RENAL FUNCTION PANEL
Albumin: 2.2 g/dL — ABNORMAL LOW (ref 3.5–5.0)
Anion gap: 10 (ref 5–15)
BUN: 40 mg/dL — ABNORMAL HIGH (ref 6–20)
CO2: 25 mmol/L (ref 22–32)
Calcium: 8.6 mg/dL — ABNORMAL LOW (ref 8.9–10.3)
Chloride: 96 mmol/L — ABNORMAL LOW (ref 98–111)
Creatinine, Ser: 5.89 mg/dL — ABNORMAL HIGH (ref 0.61–1.24)
GFR, Estimated: 10 mL/min — ABNORMAL LOW (ref >60)
Glucose, Bld: 77 mg/dL (ref 70–99)
Phosphorus: 3.7 mg/dL (ref 2.5–4.6)
Potassium: 4.1 mmol/L (ref 3.5–5.1)
Sodium: 131 mmol/L — ABNORMAL LOW (ref 135–145)

## 2021-07-09 LAB — GLUCOSE, CAPILLARY
Glucose-Capillary: 77 mg/dL (ref 70–99)
Glucose-Capillary: 123 mg/dL — ABNORMAL HIGH (ref 70–99)
Glucose-Capillary: 61 mg/dL — ABNORMAL LOW (ref 70–99)
Glucose-Capillary: 77 mg/dL (ref 70–99)
Glucose-Capillary: 110 mg/dL — ABNORMAL HIGH (ref 70–99)

## 2021-07-09 LAB — SURGICAL PATHOLOGY

## 2021-07-09 NOTE — Progress Notes (Signed)
Physical Therapy Treatment Patient Details Name: Brian [name] MRN: [id number] DOB: 05/02/1961 Today's Date: 07/09/2021   History of Present Illness 60 yo male with non union of R pilon was admitted for BK amputation on 9/30.  Pt had inability to walk in a wheelchair with RLE, and is complicated by ESRD and HD.  PMHx; cirrhosis, DM, PN, duodenal ulcer, ESRD, HD, stroke, tobacco abuse, portal HTN, hiatal hernia, kidney stones, stress ulcer, PVD    PT Comments    Patient progressing well towards PT goals. Worked on standing tolerance/balance today with use of RW and Min A for support. Able to stand for ~3 minutes and then 5 minutes for bed change/pericare. Able to shuffle left foot along side bed but not hop yet. Reports he does better wearing his shoe instead of the hospital sock for support. Reviewed there ex. Reports minimal pain. Eager to get to rehab. Education on positioning of right residual limb. Continues to be a fall risk and appropriate for SNF. Will follow.   Recommendations for follow up therapy are one component of a multi-disciplinary discharge planning process, led by the attending physician.  Recommendations may be updated based on patient status, additional functional criteria and insurance authorization.  Follow Up Recommendations  SNF     Equipment Recommendations       Recommendations for Other Services       Precautions / Restrictions Precautions Precautions: Fall;Other (comment) Precaution Comments: wound vac Required Braces or Orthoses: Other Brace Other Brace: Rt limb protector Restrictions Weight Bearing Restrictions: Yes RLE Weight Bearing: Non weight bearing     Mobility  Bed Mobility Overal bed mobility: Needs Assistance Bed Mobility: Supine to Sit;Sit to Supine     Supine to sit: Supervision;HOB elevated Sit to supine: Supervision;HOB elevated   General bed mobility comments: No assist needed, heavy use of rail to get to EOB. Able to sit long  sitting in bed.    Transfers Overall transfer level: Needs assistance Equipment used: Rolling walker (2 wheeled) Transfers: Sit to/from Stand Sit to Stand: Min assist;From elevated surface         General transfer comment: Assist to power to standing with cues for hand placement/technique. Performed x3 from EOB. Tx to chair declined as pt going down for dialysis.  Ambulation/Gait Ambulation/Gait assistance: Min assist Gait Distance (Feet): 3 Feet Assistive device: Rolling walker (2 wheeled)       General Gait Details: Able to shuffle left foot along side bed with use of RW with Min A to steady.   Stairs             Wheelchair Mobility    Modified Rankin (Stroke Patients Only)       Balance Overall balance assessment: Needs assistance Sitting-balance support: Feet supported;No upper extremity supported Sitting balance-Leahy Scale: Good Sitting balance - Comments: Able to donn socks/shoes sitting in bed without difficulty.   Standing balance support: During functional activity Standing balance-Leahy Scale: Poor Standing balance comment: UE reliant on RW, Min A-Min guard for safety. Stood for 3 mins and then for 5 mins during bed change and pericare.                            Cognition Arousal/Alertness: Awake/alert Behavior During Therapy: WFL for tasks assessed/performed Overall Cognitive Status: Within Functional Limits for tasks assessed  General Comments: Seems appropriate today; able to state he was waiting to go down to dialysis and going to rehab, stating where he would like to go.      Exercises Amputee Exercises Quad Sets: AROM;Right;5 reps;Supine Hip ABduction/ADduction: AROM;Right;5 reps;Supine Straight Leg Raises: AROM;Right;5 reps;Supine    General Comments        Pertinent Vitals/Pain Pain Assessment: Faces Faces Pain Scale: Hurts a little bit Pain Location: RLE Pain  Descriptors / Indicators: Sore Pain Intervention(s): Monitored during session;Repositioned    Home Living                      Prior Function            PT Goals (current goals can now be found in the care plan section) Progress towards PT goals: Progressing toward goals    Frequency    Min 3X/week      PT Plan Current plan remains appropriate    Co-evaluation              AM-PAC PT "6 Clicks" Mobility   Outcome Measure  Help needed turning from your back to your side while in a flat bed without using bedrails?: A Little Help needed moving from lying on your back to sitting on the side of a flat bed without using bedrails?: A Little Help needed moving to and from a bed to a chair (including a wheelchair)?: A Lot Help needed standing up from a chair using your arms (e.g., wheelchair or bedside chair)?: A Little Help needed to walk in hospital room?: Total Help needed climbing 3-5 steps with a railing? : Total 6 Click Score: 13    End of Session Equipment Utilized During Treatment: Gait belt Activity Tolerance: Patient tolerated treatment well Patient left: in bed;with call bell/phone within reach;with bed alarm set Nurse Communication: Mobility status PT Visit Diagnosis: Muscle weakness (generalized) (M62.81);Pain;Difficulty in walking, not elsewhere classified (R26.2);Other abnormalities of gait and mobility (R26.89) Pain - Right/Left: Right Pain - part of body: Leg     Time: 1034-1100 PT Time Calculation (min) (ACUTE ONLY): 26 min  Charges:  $Therapeutic Activity: 23-37 mins                     Marisa Severin, PT, DPT Acute Rehabilitation Services Pager [phone] Office [place] 07/09/2021, 12:01 PM

## 2021-07-09 NOTE — Progress Notes (Signed)
  [facility] Progress Note   Subjective:  Seen in room. No CP or dyspnea. Leg pain well controlled. SNF/rehab placement pending, per notes.  Objective Vitals:   07/08/21 0913 07/08/21 1439 07/08/21 1931 07/09/21 0800  BP: 135/85 127/79 128/83 130/81  Pulse: 65 65 61 67  Resp: 16 18 19 16   Temp: 98.2 F (36.8 C) 98 F (36.7 C) 97.8 F (36.6 C) 98.5 F (36.9 C)  TempSrc: Oral Oral Oral Oral  SpO2: 95% 95% 96% 99%  Weight:      Height:       Physical Exam General: Well appearing man, NAD Heart: RRR; no murmur Lungs: CTAB Abdomen: soft Extremities: R BKA with soft bandage and wound vac, no LE edema Dialysis Access: L AVF + thrill  Additional Objective Labs: Basic Metabolic Panel: Recent Labs  Lab 07/06/21 0954 07/07/21 0326 07/08/21 0255  NA 135 135 136  K 3.9 4.2 4.0  CL 97* 94* 97*  CO2  --  28 28  GLUCOSE 85 126* 92  BUN 34* 31* 20  CREATININE 5.40* 6.01* 4.40*  CALCIUM  --  8.9 8.8*   CBC: Recent Labs  Lab 07/06/21 0954 07/07/21 0326 07/08/21 0255  WBC  --  10.2 11.8*  HGB 14.3 12.1* 11.2*  HCT 42.0 36.8* 33.2*  MCV  --  99.5 98.8  PLT  --  206 151   Blood Culture    Component Value Date/Time   SDES WOUND 03/27/2020 1247   SPECREQUEST RIGHT FIFTH TOE SPEC A 03/27/2020 1247   CULT  03/27/2020 1247    FEW GEMELLA MORBILLORUM Standardized susceptibility testing for this organism is not available. NO ANAEROBES ISOLATED Performed at [facility], [place] [address], [place], [place] [address]    REPTSTATUS 04/01/2020 FINAL 03/27/2020 1247   Medications:  sodium chloride     sodium chloride Stopped (07/07/21 1630)   magnesium sulfate bolus IVPB      (feeding supplement) PROSource Plus  30 mL Oral TID BM   amLODipine  10 mg Oral Daily   docusate sodium  100 mg Oral Daily   doxercalciferol  6 mcg Intravenous Q M,W,F-HD   insulin aspart  0-15 Units Subcutaneous TID WC   multivitamin  1 tablet Oral QHS   nadolol  40 mg Oral  Daily   pantoprazole  40 mg Oral QPM   sevelamer carbonate  1,600 mg Oral TID with meals   zinc sulfate  220 mg Oral Daily    Dialysis Orders: MWF at [facility] - last 9/28 4hr, 400/800, EDW 80.5kg, 2K/2Ca, AVF, heparin 3000 bolus - Hectoral 21mcg IV q HD, no ESA  Assessment/Plan:  Nonunion of R tibia/fibula Fx s/p R BKA on 9/30 by Dr. Sharol Given: For SNF/rehab on discharge - pending.  ESRD: Continue HD per usual MWF schedule - for HD today.  Hypertension/volume: BP stable controlled, no edema on exam. EDW will be lower on discharge s/p amputation.  Anemia: Hgb 11.2, no ESA needed.  Metabolic bone disease: Ca ok, Phos pending. Continue home binders/VDRA.  Nutrition: Continue renal diet and protein supplements.  T2DM    Veneta Penton, Hershal Coria 07/09/2021, 10:13 AM  [name]

## 2021-07-09 NOTE — Progress Notes (Signed)
Patient ID: Brian [name], male   DOB: [birth year], 60 y.o.   MRN: [id number] Patient is a 60 year old gentleman who is status post transtibial amputation there is no drainage in the wound VAC canister.  Thank you for your assistance in helping with patient's dialysis schedule.  Plan for discharge to skilled nursing when a bed is available.

## 2021-07-09 NOTE — Progress Notes (Signed)
Pt's chart reviewed. Possible snf placement noted. Pt receives out-pt HD at [facility] on MWF. Pt needs to arrive at 10:05 for 10:15 chair time. Will assist as needed.  Melven Sartorius Renal Navigator [phone]

## 2021-07-10 LAB — GLUCOSE, CAPILLARY
Glucose-Capillary: 97 mg/dL (ref 70–99)
Glucose-Capillary: 106 mg/dL — ABNORMAL HIGH (ref 70–99)
Glucose-Capillary: 110 mg/dL — ABNORMAL HIGH (ref 70–99)

## 2021-07-10 LAB — SARS CORONAVIRUS 2 (TAT 6-24 HRS): SARS Coronavirus 2: NEGATIVE

## 2021-07-10 MED ORDER — LOPERAMIDE HCL 2 MG PO CAPS
2.0000 mg | ORAL_CAPSULE | ORAL | Status: DC | PRN
  Administered 2021-07-10: 2 mg via ORAL
  Administered 2021-07-10: 4 mg via ORAL
  Filled 2021-07-10 (×3): qty 1

## 2021-07-10 MED ORDER — OXYCODONE-ACETAMINOPHEN 5-325 MG PO TABS: 1.0000 | ORAL_TABLET | ORAL | 0 refills | Status: AC | PRN

## 2021-07-10 NOTE — Care Management Important Message (Signed)
Important Message  Patient Details  Name: Brian [name] MRN: [id number] Date of Birth: Jan 03, 1961   Medicare Important Message Given:  Yes     [name] 07/10/2021, 3:26 PM

## 2021-07-10 NOTE — Discharge Summary (Signed)
Discharge Diagnoses:  Active Problems:   Charcot's joint of right ankle   Closed right ankle fracture, with nonunion, subsequent encounter   Pressure injury of skin   Surgeries: Procedure(s): RIGHT BELOW KNEE AMPUTATION APPLICATION OF WOUND VAC on 07/06/2021    Consultants: Treatment Team:  Elmarie Shiley, MD Roney Jaffe, MD  Discharged Condition: Improved  Hospital Course: Brian [name] is an 61 y.o. male who was admitted 07/06/2021 with a chief complaint of pilon fracture non union, with a final diagnosis of Non Union Right Pilon Fracture.  Patient was brought to the operating room on 07/06/2021 and underwent Procedure(s): RIGHT BELOW KNEE AMPUTATION APPLICATION OF WOUND VAC.    Patient was given perioperative antibiotics:  Anti-infectives (From admission, onward)    Start     Dose/Rate Route Frequency Ordered Stop   07/06/21 2100  clindamycin (CLEOCIN) IVPB 900 mg        900 mg 100 mL/hr over 30 Minutes Intravenous Every 8 hours 07/06/21 1550 07/07/21 1437   07/06/21 0945  clindamycin (CLEOCIN) IVPB 900 mg        900 mg 100 mL/hr over 30 Minutes Intravenous On call to O.R. 07/06/21 7829 07/06/21 1312     .  Patient was given sequential compression devices, early ambulation, and aspirin for DVT prophylaxis.  Recent vital signs: Patient Vitals for the past 24 hrs:  BP Temp Temp src Pulse Resp SpO2 Weight  07/10/21 0732 119/78 98.9 F (37.2 C) -- 64 15 94 % --  07/09/21 2109 129/84 99.9 F (37.7 C) Oral 69 16 93 % --  07/09/21 1843 120/79 99.9 F (37.7 C) Oral 65 16 96 % --  07/09/21 1700 133/81 -- -- 74 -- -- --  07/09/21 1630 129/77 -- -- 63 -- -- --  07/09/21 1600 127/78 -- -- 72 -- -- --  07/09/21 1530 127/72 -- -- 72 -- -- --  07/09/21 1500 139/62 -- -- 68 -- -- --  07/09/21 1433 119/70 -- -- 66 -- -- --  07/09/21 1428 121/75 98.4 F (36.9 C) Oral 66 16 95 % 81.1 kg  .  Recent laboratory studies: No results found.  Discharge Medications:   Allergies as  of 07/10/2021       Reactions   Penicillins Hives, Rash   Has patient had a PCN reaction causing immediate rash, facial/tongue/throat swelling, SOB or lightheadedness with hypotension: Yes Has patient had a PCN reaction causing severe rash involving mucus membranes or skin necrosis: No Has patient had a PCN reaction that required hospitalization: No Has patient had a PCN reaction occurring within the last 10 years: No If all of the above answers are "NO", then may proceed with Cephalosporin use. Ceftriaxone = no issues   Metoprolol Other (See Comments)   Cause inflamed stomach   Nsaids    Due to kidneys   Aspirin Nausea And Vomiting   Other Reaction: GI Upset Bleeding ulcer        Medication List     STOP taking these medications    HYDROcodone-acetaminophen 5-325 MG tablet Commonly known as: NORCO/VICODIN       TAKE these medications    acetaminophen 650 MG CR tablet Commonly known as: TYLENOL Take 650 mg by mouth every 8 (eight) hours as needed for pain.   amLODipine 10 MG tablet Commonly known as: NORVASC Take 1 tablet (10 mg total) by mouth daily.   insulin aspart protamine- aspart (70-30) 100 UNIT/ML injection Commonly known as: NOVOLOG MIX 70/30 Inject  0.05 mLs (5 Units total) into the skin at bedtime. What changed:  when to take this reasons to take this additional instructions   loperamide 2 MG capsule Commonly known as: IMODIUM Take 2 mg by mouth as needed for diarrhea or loose stools.   nadolol 40 MG tablet Commonly known as: Corgard Take 1 tablet (40 mg total) by mouth daily.   oxyCODONE-acetaminophen 5-325 MG tablet Commonly known as: PERCOCET/ROXICET Take 1 tablet by mouth every 4 (four) hours as needed.   pantoprazole 20 MG tablet Commonly known as: PROTONIX Take 1 tablet by mouth once daily   sevelamer carbonate 800 MG tablet Commonly known as: RENVELA Take 1,600 mg by mouth 3 (three) times daily.        Diagnostic Studies: US  Abdomen Limited RUQ (LIVER/GB)  Result Date: 06/22/2021 CLINICAL DATA:  Cirrhosis, [place] screen EXAM: ULTRASOUND ABDOMEN LIMITED RIGHT UPPER QUADRANT COMPARISON:  Ultrasound December 07, 2020 FINDINGS: Gallbladder: Surgically absent. Common bile duct: Diameter: 5 mm Liver: No focal lesion identified. Coarsened hepatic echogenicity with nodular hepatic contour, consistent with given diagnosis of cirrhosis. Portal vein is patent on color Doppler imaging with normal direction of blood flow towards the liver. Other: Large volume ascites. IMPRESSION: Cirrhosis with large volume ascites. No overt focal hepatic lesion visualized. Electronically Signed   By: Dahlia Bailiff M.D.   On: 06/22/2021 09:50    Patient benefited maximally from their hospital stay and there were no complications.     Disposition: Discharge disposition: 03-Skilled Nursing Facility      Discharge Instructions     Call MD / Call 911   Complete by: As directed    If you experience chest pain or shortness of breath, CALL 911 and be transported to the hospital emergency room.  If you develope a fever above 101 F, pus (white drainage) or increased drainage or redness at the wound, or calf pain, call your surgeon's office.   Constipation Prevention   Complete by: As directed    Drink plenty of fluids.  Prune juice may be helpful.  You may use a stool softener, such as Colace (over the counter) 100 mg twice a day.  Use MiraLax (over the counter) for constipation as needed.   Diet - low sodium heart healthy   Complete by: As directed    Increase activity slowly as tolerated   Complete by: As directed    Negative Pressure Wound Therapy - Incisional   Complete by: As directed    Show patient how to attach vac   Post-operative opioid taper instructions:   Complete by: As directed    POST-OPERATIVE OPIOID TAPER INSTRUCTIONS: It is important to wean off of your opioid medication as soon as possible. If you do not need pain medication after  your surgery it is ok to stop day one. Opioids include: Codeine, Hydrocodone(Norco, Vicodin), Oxycodone(Percocet, oxycontin) and hydromorphone amongst others.  Long term and even short term use of opiods can cause: Increased pain response Dependence Constipation Depression Respiratory depression And more.  Withdrawal symptoms can include Flu like symptoms Nausea, vomiting And more Techniques to manage these symptoms Hydrate well Eat regular healthy meals Stay active Use relaxation techniques(deep breathing, meditating, yoga) Do Not substitute Alcohol to help with tapering If you have been on opioids for less than two weeks and do not have pain than it is ok to stop all together.  Plan to wean off of opioids This plan should start within one week post op of your joint  replacement. Maintain the same interval or time between taking each dose and first decrease the dose.  Cut the total daily intake of opioids by one tablet each day Next start to increase the time between doses. The last dose that should be eliminated is the evening dose.          Follow-up Information     Suzan Slick, NP Follow up in 1 week(s).   Specialty: Orthopedic Surgery Contact information: [address] [place] [address] [phone]                  Signed: Newt Minion 07/10/2021, 11:06 AM

## 2021-07-10 NOTE — Progress Notes (Signed)
[place] on site to pick up pt, IV discontinued, pt belongings in hand, no further needs

## 2021-07-10 NOTE — Progress Notes (Signed)
Pt report given to [place] RN for [facility].

## 2021-07-10 NOTE — Progress Notes (Signed)
Spoke to [place] at [facility] to make clinic aware pt to d/c to [facility] today and will resume schedule tomorrow. [facility] aware of pt's HD schedule and clinic.    Melven Sartorius Renal Navigator [phone]

## 2021-07-10 NOTE — TOC Progression Note (Signed)
Transition of Care [facility]) - Initial/Assessment Note    Patient Details  Name: Brian [name] MRN: [id number] Date of Birth: 01-28-1961  Transition of Care [facility]) CM/SW Contact:    Brian [name], [employer] Phone Number: 07/10/2021, 1:40 PM  Clinical Narrative:                 CSW met with the patient and sister Brian [name] at bedside.  The family called and spoke with someone at [facility] and was told that the facility would accept the patient.    CSW called Brian [name] with admissions at [facility] and verified that the patient has received a bed offer.    Pending: COVID test results  Expected Discharge Plan: [place] Barriers to Discharge: Continued Medical Work up, SNF Pending bed offer   Patient Goals and CMS Choice Patient states their goals for this hospitalization and ongoing recovery are:: return home CMS Medicare.gov Compare Post Acute Care list provided to:: Patient Choice offered to / list presented to : Patient  Expected Discharge Plan and Services Expected Discharge Plan: [place] Choice: [place] Living arrangements for the past 2 months: Single Family Home Expected Discharge Date: 07/10/21                                    Prior Living Arrangements/Services Living arrangements for the past 2 months: Single Family Home Lives with:: Self Patient language and need for interpreter reviewed:: Yes Do you feel safe going back to the place where you live?: Yes               Activities of Daily Living Home Assistive Devices/Equipment: Walker (specify type), Cane (specify quad or straight), Eyeglasses, CBG Meter, Blood pressure cuff ADL Screening (condition at time of admission) Patient's cognitive ability adequate to safely complete daily activities?: Yes Is the patient deaf or have difficulty hearing?: No Does the patient have difficulty seeing, even when wearing glasses/contacts?: No Does the patient  have difficulty concentrating, remembering, or making decisions?: No Patient able to express need for assistance with ADLs?: Yes Does the patient have difficulty dressing or bathing?: No Independently performs ADLs?: Yes (appropriate for developmental age) Does the patient have difficulty walking or climbing stairs?: Yes (due to right ankle) Weakness of Legs: Both Weakness of Arms/Hands: Both  Permission Sought/Granted Permission sought to share information with : Case Manager, Customer service manager Permission granted to share information with : Yes, Verbal Permission Granted  Share Information with NAME: Brian [name]  Permission granted to share info w AGENCY: SNF's  Permission granted to share info w Relationship: Sister  Permission granted to share info w Contact Information: [phone]  Emotional Assessment Appearance:: Appears stated age Attitude/Demeanor/Rapport: Engaged Affect (typically observed): Accepting Orientation: : Oriented to Self, Oriented to Place, Oriented to  Time, Oriented to Situation      Admission diagnosis:  Closed right ankle fracture, with nonunion, subsequent encounter [S82.891K] Patient Active Problem List   Diagnosis Date Noted   Pressure injury of skin 07/07/2021   Closed right ankle fracture, with nonunion, subsequent encounter 07/06/2021   Charcot's joint of right ankle    Personal history of diabetic foot ulcer 04/11/2020   Diabetic infection of right foot ([place]) 03/26/2020   Headache, unspecified 09/10/2019   Diarrhea, unspecified 08/16/2019   Encounter for removal of sutures 07/28/2018   Cryptogenic cirrhosis ([place])  07/16/2018   Encounter for immunization 07/01/2018   Gastritis 01/23/2018   Acute blood loss anemia 01/23/2018   Duodenal ulcer    Upper GI bleed 01/22/2018   History of CVA (cerebrovascular accident) 01/15/2018   Iron deficiency anemia, unspecified 11/25/2017   Coagulation defect, unspecified ([place]) 11/18/2017    Unspecified protein-calorie malnutrition ([place]) 11/11/2017   Acquired absence of spleen 10/30/2017   Nicotine dependence, unspecified, uncomplicated 13/68/5992   Other disorders of phosphorus metabolism 10/30/2017   Unspecified nephritic syndrome with unspecified morphologic changes 10/30/2017   Anemia in chronic kidney disease 10/24/2017   Dependence on renal dialysis ([place]) 10/24/2017   Encounter for screening for respiratory tuberculosis 10/24/2017   Pruritus, unspecified 10/24/2017   Secondary hyperparathyroidism of renal origin ([employer]) 10/24/2017   Shortness of breath 10/24/2017   Type 1 diabetes mellitus with kidney complication ([place]) - on insulin 05/01/2017   Hemorrhagic stroke [facility])- Jan 2010 05/01/2017   H/O hyperlipidemia- sev yrs ago 05/01/2017   h/o Hypertriglyceridemia- 13000's in past 05/01/2017   H/O splenectomy 05/01/2017   Status post cholecystectomy 05/01/2017   Gout- L foot- mult times 05/01/2017   Diabetic neuropathy ([place])- 6 mo or so; never started meds 05/01/2017   Vitamin D deficiency 05/01/2017   ESRD on dialysis ([place]) 02/05/2017   Anasarca- may 2018 hosp 02/05/2017   Leukocytosis- s/p plenectomy 02/05/2017   Hypertension    Diabetes mellitus without complication (HCC)    Tobacco abuse- >er than 55 pack yr hx; current smoker    PCP:  Brian Downing, MD Pharmacy:   [facility] ([facility] [place] [address] [place] ([place]) [place] [address] Phone: [phone] Fax: [phone]     Social Determinants of Health (SDOH) Interventions    Readmission Risk Interventions No flowsheet data found.

## 2021-07-10 NOTE — TOC Transition Note (Signed)
Transition of Care [facility]) - CM/SW Discharge Note   Patient Details  Name: GILMER KAMINSKY MRN: [id number] Date of Birth: 1961-02-19  [facility] CM/SW Contact:  Milinda Antis, [place] Phone Number: 07/10/2021, 3:33 PM   Clinical Narrative:    Patient will DC to:  [facility] Anticipated DC date:  07/10/2021 Transport by:  Corey Harold   Per MD patient ready for DC to SNF. RN to call report prior to discharge [phone]. RN, patient, and facility notified of DC. Discharge Summary and FL2 sent to facility. DC packet on chart. Ambulance transport requested for patient.   CSW will sign off for now as social work intervention is no longer needed. Please consult [place] again if new needs arise.     Final next level of care: Skilled Nursing Facility Barriers to Discharge: Barriers Resolved   Patient Goals and CMS Choice Patient states their goals for this hospitalization and ongoing recovery are:: return home CMS Medicare.gov Compare Post Acute Care list provided to:: Patient Choice offered to / list presented to : Patient  Discharge Placement              Patient chooses bed at: [facility] Patient to be transferred to facility by: [facility] Name of family member notified: patient alert and oriented Patient and family notified of of transfer: 07/10/21  Discharge Plan and Services     Post Acute Care Choice: [place]                               Social Determinants of Health (SDOH) Interventions     Readmission Risk Interventions No flowsheet data found.

## 2021-07-10 NOTE — Progress Notes (Signed)
  [facility] Progress Note   Subjective:  Seen in room. Did fine with dialysis yesterday. Pain controlled, no CP/dyspnea. SNF pending.  Objective Vitals:   07/09/21 1700 07/09/21 1843 07/09/21 2109 07/10/21 0732  BP: 133/81 120/79 129/84 119/78  Pulse: 74 65 69 64  Resp:  16 16 15   Temp:  99.9 F (37.7 C) 99.9 F (37.7 C) 98.9 F (37.2 C)  TempSrc:  Oral Oral   SpO2:  96% 93% 94%  Weight:      Height:       Physical Exam General: Well appearing man, NAD Heart: RRR; no murmur Lungs: CTAB Abdomen: soft Extremities: R BKA with soft bandage and wound vac, 1+ LLE edema Dialysis Access: L AVF + thrill  Additional Objective Labs: Basic Metabolic Panel: Recent Labs  Lab 07/07/21 0326 07/08/21 0255 07/09/21 1426  NA 135 136 131*  K 4.2 4.0 4.1  CL 94* 97* 96*  CO2 28 28 25   GLUCOSE 126* 92 77  BUN 31* 20 40*  CREATININE 6.01* 4.40* 5.89*  CALCIUM 8.9 8.8* 8.6*  PHOS  --   --  3.7   Liver Function Tests: Recent Labs  Lab 07/09/21 1426  ALBUMIN 2.2*   CBC: Recent Labs  Lab 07/07/21 0326 07/08/21 0255 07/09/21 1426  WBC 10.2 11.8* 10.8*  HGB 12.1* 11.2* 11.5*  HCT 36.8* 33.2* 33.8*  MCV 99.5 98.8 98.8  PLT 206 151 169   Blood Culture    Component Value Date/Time   SDES WOUND 03/27/2020 1247   SPECREQUEST RIGHT FIFTH TOE SPEC A 03/27/2020 1247   CULT  03/27/2020 1247    FEW GEMELLA MORBILLORUM Standardized susceptibility testing for this organism is not available. NO ANAEROBES ISOLATED Performed at [facility], [place] [address], [place], [place] [address]    REPTSTATUS 04/01/2020 FINAL 03/27/2020 1247   Medications:  sodium chloride     sodium chloride Stopped (07/07/21 1630)   magnesium sulfate bolus IVPB      (feeding supplement) PROSource Plus  30 mL Oral TID BM   amLODipine  10 mg Oral Daily   docusate sodium  100 mg Oral Daily   doxercalciferol  6 mcg Intravenous Q M,W,F-HD   insulin aspart  0-15 Units Subcutaneous TID  WC   multivitamin  1 tablet Oral QHS   nadolol  40 mg Oral Daily   pantoprazole  40 mg Oral QPM   sevelamer carbonate  1,600 mg Oral TID with meals   zinc sulfate  220 mg Oral Daily    Dialysis Orders: MWF at [facility] - last 9/28 4hr, 400/800, EDW 80.5kg, 2K/2Ca, AVF, heparin 3000 bolus - Hectoral 30mcg IV q HD, no ESA   Assessment/Plan:  Nonunion of R tibia/fibula Fx s/p R BKA on 9/30 by Dr. Sharol Given: For SNF/rehab on discharge - pending.  ESRD: Continue HD per usual MWF schedule - next 10/5.  Hypertension/volume: BP stable controlled, no edema on exam. EDW will be lower on discharge s/p amputation.  Anemia: Hgb 11.5, no ESA needed.  Metabolic bone disease: Ca ok, Phos pending. Continue home binders/VDRA.  Nutrition: Continue renal diet and protein supplements.  T2DM  Brian [name], Brian [name] 07/10/2021, 9:49 AM  [name]

## 2021-07-10 NOTE — Progress Notes (Signed)
Patient ID: Brian [name], male   DOB: 06-21-1961, 60 y.o.   MRN: [id number] Patient is status post right transtibial amputation there is no drainage in the wound VAC canister.  Patient complains of some diarrhea and a prescription for Imodium was placed.  COVID test was ordered anticipate discharge to skilled nursing when approved by insurance

## 2021-07-11 ENCOUNTER — Telehealth: Payer: Self-pay | Admitting: Orthopedic Surgery

## 2021-07-11 NOTE — Telephone Encounter (Signed)
I called and sw Social worker Talbert Forest and she states that she was following up on this pt and she had received a call while out of office and the pt has since had surgery 07/06/21 right BKA.  Pt has charcot collapse and nonunion pilon fx poor circulation and not an ankle fusion candidate. This is what lead to surgery. Social worker will follow up with SNF

## 2021-07-11 NOTE — Telephone Encounter (Signed)
Adult protective service called and has some questions about pts amputation.   CB [phone]

## 2021-07-13 ENCOUNTER — Encounter: Payer: Medicare Other | Admitting: Family

## 2021-07-17 ENCOUNTER — Ambulatory Visit (INDEPENDENT_AMBULATORY_CARE_PROVIDER_SITE_OTHER): Payer: Medicare Other | Admitting: Family

## 2021-07-17 DIAGNOSIS — Z89511 Acquired absence of right leg below knee: Secondary | ICD-10-CM

## 2021-07-17 NOTE — Progress Notes (Signed)
Post-Op Visit Note   Patient: Brian [name]           Date of Birth: Sep 25, 1961           MRN: [id number] Visit Date: 07/17/2021 PCP: [name], MD  Chief Complaint:  Chief Complaint  Patient presents with   Right Leg - Routine Post Op    07/06/2021 right BKA  D/c'd vac today no drainage in canister Shrinker photo obtained [facility]    HPI:  HPI The patient is a 60 year old gentleman seen status post right transtibial amputation on September 30.  His wound VAC was removed today there is no drainage in the canister.  He has been wearing a shrinker as well.  He is residing at [facility] skilled [facility].  Wearing a limb protector. Ortho Exam Incision well approximated there is no gaping drainage no erythema no sign of infection  Visit Diagnoses: No diagnosis found.  Plan: Follow-up in the office in 2 weeks.  Begin daily Dial soap cleansing.  Dry dressing changes.  Continue shrinker for compression.  Discussed course of healing and to set up with prosthetic.  Will send an order for his prosthesis set up  Follow-Up Instructions: No follow-ups on file.   Imaging: No results found.  Orders:  No orders of the defined types were placed in this encounter.  No orders of the defined types were placed in this encounter.    PMFS History: Patient Active Problem List   Diagnosis Date Noted   Pressure injury of skin 07/07/2021   Closed right ankle fracture, with nonunion, subsequent encounter 07/06/2021   Charcot's joint of right ankle    Personal history of diabetic foot ulcer 04/11/2020   Diabetic infection of right foot ([place]) 03/26/2020   Headache, unspecified 09/10/2019   Diarrhea, unspecified 08/16/2019   Encounter for removal of sutures 07/28/2018   Cryptogenic cirrhosis ([place]) 07/16/2018   Encounter for immunization 07/01/2018   Gastritis 01/23/2018   Acute blood loss anemia 01/23/2018   Duodenal ulcer    Upper GI bleed 01/22/2018   History of CVA  (cerebrovascular accident) 01/15/2018   Iron deficiency anemia, unspecified 11/25/2017   Coagulation defect, unspecified ([place]) 11/18/2017   Unspecified protein-calorie malnutrition ([place]) 11/11/2017   Acquired absence of spleen 10/30/2017   Nicotine dependence, unspecified, uncomplicated 15/02/6978   Other disorders of phosphorus metabolism 10/30/2017   Unspecified nephritic syndrome with unspecified morphologic changes 10/30/2017   Anemia in chronic kidney disease 10/24/2017   Dependence on renal dialysis ([place]) 10/24/2017   Encounter for screening for respiratory tuberculosis 10/24/2017   Pruritus, unspecified 10/24/2017   Secondary hyperparathyroidism of renal origin ([place]) 10/24/2017   Shortness of breath 10/24/2017   Type 1 diabetes mellitus with kidney complication ([place]) - on insulin 05/01/2017   Hemorrhagic stroke ([place])- Jan 2010 05/01/2017   H/O hyperlipidemia- sev yrs ago 05/01/2017   h/o Hypertriglyceridemia- 13000's in past 05/01/2017   H/O splenectomy 05/01/2017   Status post cholecystectomy 05/01/2017   Gout- L foot- mult times 05/01/2017   Diabetic neuropathy ([place])- 6 mo or so; never started meds 05/01/2017   Vitamin D deficiency 05/01/2017   ESRD on dialysis ([place]) 02/05/2017   Anasarca- may 2018 hosp 02/05/2017   Leukocytosis- s/p plenectomy 02/05/2017   Hypertension    Diabetes mellitus without complication (HCC)    Tobacco abuse- >er than 55 pack yr hx; current smoker    Past Medical History:  Diagnosis Date   Allergy    Anemia  Arthritis    Bleeding ulcer    Cirrhosis (HCC)    w/ esophageal varices   Complication of anesthesia    " I had a nose bleed a day or two after surgery-it could have been the oxygen"   Diabetes mellitus without complication (HCC)    type II   Diabetic neuropathy (HCC)    Duodenal ulcer    Elevated TSH    ESRD on dialysis ([place])    MWF   History of hiatal hernia    History of kidney stones    Hypertension    Portal hypertension  (HCC)    Stress ulcer    after surgery- "only one time"  patient is on pantoprazole daily   Stroke [facility])    hemorrhagic 2010   Tobacco abuse     Family History  Problem Relation Age of Onset   Lymphoma Mother    Hypertension Father    Diabetes Mellitus II Father    Hypertension Brother    Diabetes Mellitus I Brother    Hypertension Brother    Colon cancer Neg Hx    Stomach cancer Neg Hx    Colon polyps Neg Hx    Esophageal cancer Neg Hx    Rectal cancer Neg Hx     Past Surgical History:  Procedure Laterality Date   A/V FISTULAGRAM N/A 02/05/2018   Procedure: A/V FISTULAGRAM;  Surgeon: Conrad Los Veteranos I, MD;  Location: [place] [facility];  Service: Cardiovascular;  Laterality: N/A;   AMPUTATION Right 03/27/2020   Procedure: AMPUTATION RAY 5th;  Surgeon: Trula Slade, DPM;  Location: [place];  Service: Podiatry;  Laterality: Right;   AMPUTATION Right 07/06/2021   Procedure: RIGHT BELOW KNEE AMPUTATION;  Surgeon: Newt Minion, MD;  Location: [place];  Service: Orthopedics;  Laterality: Right;   APPLICATION OF WOUND VAC  07/06/2021   Procedure: APPLICATION OF WOUND VAC;  Surgeon: Newt Minion, MD;  Location: [place];  Service: Orthopedics;;   AV FISTULA PLACEMENT Left 05/16/2017   Procedure: ARTERIOVENOUS (AV) FISTULA CREATION-LEFT;  Surgeon: Angelia Mould, MD;  [facility];  Service: Vascular;  Laterality: Left;   CHOLECYSTECTOMY     COLONOSCOPY     ESOPHAGOGASTRODUODENOSCOPY N/A 01/23/2018   Procedure: ESOPHAGOGASTRODUODENOSCOPY (EGD);  Surgeon: Yetta Flock, MD;  Location[facility] ENDOSCOPY;  Service: Gastroenterology;  Laterality: N/A;   LITHOTRIPSY     PANCREAS SURGERY     PERIPHERAL VASCULAR BALLOON ANGIOPLASTY  02/05/2018   Procedure: PERIPHERAL VASCULAR BALLOON ANGIOPLASTY;  Surgeon: Conrad [place], MD;  Location: [place] [facility];  Service: Cardiovascular;;  left AV fistula   SPLENECTOMY     WOUND DEBRIDEMENT Right 03/30/2020   Procedure: DEBRIDEMENT  WOUND RIGHT FOOT;  Surgeon: Trula Slade, DPM;  Location: [place];  Service: Podiatry;  Laterality: Right;   Social History   Occupational History   Occupation: disability  Tobacco Use   Smoking status: Every Day    Packs/day: 0.50    Years: 40.00    Pack years: 20.00    Types: Cigarettes   Smokeless tobacco: Never  Vaping Use   Vaping Use: Never used  Substance and Sexual Activity   Alcohol use: No    Comment: quit around 2010; no h/o heavy use   Drug use: No   Sexual activity: Not on file

## 2021-07-19 ENCOUNTER — Inpatient Hospital Stay (HOSPITAL_COMMUNITY)
Admission: EM | Admit: 2021-07-19 | Discharge: 2021-08-07 | DRG: 871 | Disposition: E | Payer: Medicare Other | Attending: Student | Admitting: Student

## 2021-07-19 ENCOUNTER — Encounter (HOSPITAL_COMMUNITY): Payer: Self-pay | Admitting: Emergency Medicine

## 2021-07-19 ENCOUNTER — Other Ambulatory Visit: Payer: Self-pay

## 2021-07-19 ENCOUNTER — Emergency Department (HOSPITAL_COMMUNITY): Payer: Medicare Other

## 2021-07-19 DIAGNOSIS — Z79899 Other long term (current) drug therapy: Secondary | ICD-10-CM

## 2021-07-19 DIAGNOSIS — Z992 Dependence on renal dialysis: Secondary | ICD-10-CM | POA: Diagnosis not present

## 2021-07-19 DIAGNOSIS — R188 Other ascites: Secondary | ICD-10-CM | POA: Diagnosis present

## 2021-07-19 DIAGNOSIS — E10649 Type 1 diabetes mellitus with hypoglycemia without coma: Secondary | ICD-10-CM | POA: Diagnosis present

## 2021-07-19 DIAGNOSIS — Z66 Do not resuscitate: Secondary | ICD-10-CM | POA: Diagnosis not present

## 2021-07-19 DIAGNOSIS — Z20822 Contact with and (suspected) exposure to covid-19: Secondary | ICD-10-CM | POA: Diagnosis present

## 2021-07-19 DIAGNOSIS — M1990 Unspecified osteoarthritis, unspecified site: Secondary | ICD-10-CM | POA: Diagnosis present

## 2021-07-19 DIAGNOSIS — Z515 Encounter for palliative care: Secondary | ICD-10-CM

## 2021-07-19 DIAGNOSIS — N186 End stage renal disease: Secondary | ICD-10-CM | POA: Diagnosis present

## 2021-07-19 DIAGNOSIS — I959 Hypotension, unspecified: Secondary | ICD-10-CM | POA: Diagnosis present

## 2021-07-19 DIAGNOSIS — D649 Anemia, unspecified: Secondary | ICD-10-CM | POA: Diagnosis not present

## 2021-07-19 DIAGNOSIS — G9341 Metabolic encephalopathy: Secondary | ICD-10-CM | POA: Diagnosis present

## 2021-07-19 DIAGNOSIS — D72825 Bandemia: Secondary | ICD-10-CM | POA: Diagnosis present

## 2021-07-19 DIAGNOSIS — K7682 Hepatic encephalopathy: Secondary | ICD-10-CM | POA: Diagnosis not present

## 2021-07-19 DIAGNOSIS — K652 Spontaneous bacterial peritonitis: Secondary | ICD-10-CM | POA: Diagnosis present

## 2021-07-19 DIAGNOSIS — K766 Portal hypertension: Secondary | ICD-10-CM | POA: Diagnosis present

## 2021-07-19 DIAGNOSIS — E119 Type 2 diabetes mellitus without complications: Secondary | ICD-10-CM

## 2021-07-19 DIAGNOSIS — R601 Generalized edema: Secondary | ICD-10-CM | POA: Diagnosis present

## 2021-07-19 DIAGNOSIS — E871 Hypo-osmolality and hyponatremia: Secondary | ICD-10-CM | POA: Diagnosis not present

## 2021-07-19 DIAGNOSIS — L89150 Pressure ulcer of sacral region, unstageable: Secondary | ICD-10-CM | POA: Diagnosis present

## 2021-07-19 DIAGNOSIS — Z8249 Family history of ischemic heart disease and other diseases of the circulatory system: Secondary | ICD-10-CM

## 2021-07-19 DIAGNOSIS — E8770 Fluid overload, unspecified: Secondary | ICD-10-CM | POA: Diagnosis not present

## 2021-07-19 DIAGNOSIS — K7460 Unspecified cirrhosis of liver: Secondary | ICD-10-CM | POA: Diagnosis present

## 2021-07-19 DIAGNOSIS — A419 Sepsis, unspecified organism: Secondary | ICD-10-CM | POA: Diagnosis not present

## 2021-07-19 DIAGNOSIS — D689 Coagulation defect, unspecified: Secondary | ICD-10-CM | POA: Diagnosis present

## 2021-07-19 DIAGNOSIS — E872 Acidosis, unspecified: Secondary | ICD-10-CM

## 2021-07-19 DIAGNOSIS — I120 Hypertensive chronic kidney disease with stage 5 chronic kidney disease or end stage renal disease: Secondary | ICD-10-CM | POA: Diagnosis present

## 2021-07-19 DIAGNOSIS — R6520 Severe sepsis without septic shock: Secondary | ICD-10-CM | POA: Diagnosis present

## 2021-07-19 DIAGNOSIS — Z6825 Body mass index (BMI) 25.0-25.9, adult: Secondary | ICD-10-CM

## 2021-07-19 DIAGNOSIS — N2581 Secondary hyperparathyroidism of renal origin: Secondary | ICD-10-CM | POA: Diagnosis present

## 2021-07-19 DIAGNOSIS — Z880 Allergy status to penicillin: Secondary | ICD-10-CM

## 2021-07-19 DIAGNOSIS — E1022 Type 1 diabetes mellitus with diabetic chronic kidney disease: Secondary | ICD-10-CM | POA: Diagnosis present

## 2021-07-19 DIAGNOSIS — K7469 Other cirrhosis of liver: Secondary | ICD-10-CM | POA: Diagnosis not present

## 2021-07-19 DIAGNOSIS — D72829 Elevated white blood cell count, unspecified: Secondary | ICD-10-CM | POA: Diagnosis present

## 2021-07-19 DIAGNOSIS — E162 Hypoglycemia, unspecified: Secondary | ICD-10-CM

## 2021-07-19 DIAGNOSIS — Z807 Family history of other malignant neoplasms of lymphoid, hematopoietic and related tissues: Secondary | ICD-10-CM

## 2021-07-19 DIAGNOSIS — Z9081 Acquired absence of spleen: Secondary | ICD-10-CM

## 2021-07-19 DIAGNOSIS — Z720 Tobacco use: Secondary | ICD-10-CM | POA: Diagnosis present

## 2021-07-19 DIAGNOSIS — R6521 Severe sepsis with septic shock: Secondary | ICD-10-CM | POA: Diagnosis present

## 2021-07-19 DIAGNOSIS — Z4659 Encounter for fitting and adjustment of other gastrointestinal appliance and device: Secondary | ICD-10-CM

## 2021-07-19 DIAGNOSIS — E1040 Type 1 diabetes mellitus with diabetic neuropathy, unspecified: Secondary | ICD-10-CM | POA: Diagnosis present

## 2021-07-19 DIAGNOSIS — Z8711 Personal history of peptic ulcer disease: Secondary | ICD-10-CM

## 2021-07-19 DIAGNOSIS — F17210 Nicotine dependence, cigarettes, uncomplicated: Secondary | ICD-10-CM | POA: Diagnosis present

## 2021-07-19 DIAGNOSIS — R54 Age-related physical debility: Secondary | ICD-10-CM | POA: Diagnosis present

## 2021-07-19 DIAGNOSIS — K3189 Other diseases of stomach and duodenum: Secondary | ICD-10-CM | POA: Diagnosis present

## 2021-07-19 DIAGNOSIS — E43 Unspecified severe protein-calorie malnutrition: Secondary | ICD-10-CM | POA: Diagnosis present

## 2021-07-19 DIAGNOSIS — Z8673 Personal history of transient ischemic attack (TIA), and cerebral infarction without residual deficits: Secondary | ICD-10-CM

## 2021-07-19 DIAGNOSIS — E1029 Type 1 diabetes mellitus with other diabetic kidney complication: Secondary | ICD-10-CM | POA: Diagnosis present

## 2021-07-19 DIAGNOSIS — Z888 Allergy status to other drugs, medicaments and biological substances status: Secondary | ICD-10-CM

## 2021-07-19 DIAGNOSIS — K649 Unspecified hemorrhoids: Secondary | ICD-10-CM | POA: Diagnosis present

## 2021-07-19 DIAGNOSIS — Z886 Allergy status to analgesic agent status: Secondary | ICD-10-CM

## 2021-07-19 DIAGNOSIS — D631 Anemia in chronic kidney disease: Secondary | ICD-10-CM | POA: Diagnosis present

## 2021-07-19 DIAGNOSIS — Z89511 Acquired absence of right leg below knee: Secondary | ICD-10-CM

## 2021-07-19 DIAGNOSIS — Z833 Family history of diabetes mellitus: Secondary | ICD-10-CM

## 2021-07-19 DIAGNOSIS — I8510 Secondary esophageal varices without bleeding: Secondary | ICD-10-CM | POA: Diagnosis present

## 2021-07-19 DIAGNOSIS — Z794 Long term (current) use of insulin: Secondary | ICD-10-CM

## 2021-07-19 LAB — CBC WITH DIFFERENTIAL/PLATELET
Abs Immature Granulocytes: 0.18 10*3/uL — ABNORMAL HIGH (ref 0.00–0.07)
Basophils Absolute: 0 10*3/uL (ref 0.0–0.1)
Basophils Relative: 0 %
Eosinophils Absolute: 0 10*3/uL (ref 0.0–0.5)
Eosinophils Relative: 0 %
HCT: 37.2 % — ABNORMAL LOW (ref 39.0–52.0)
Hemoglobin: 12.2 g/dL — ABNORMAL LOW (ref 13.0–17.0)
Immature Granulocytes: 1 %
Lymphocytes Relative: 6 %
Lymphs Abs: 1 10*3/uL (ref 0.7–4.0)
MCH: 32.8 pg (ref 26.0–34.0)
MCHC: 32.8 g/dL (ref 30.0–36.0)
MCV: 100 fL (ref 80.0–100.0)
Monocytes Absolute: 1.4 10*3/uL — ABNORMAL HIGH (ref 0.1–1.0)
Monocytes Relative: 9 %
Neutro Abs: 13.1 10*3/uL — ABNORMAL HIGH (ref 1.7–7.7)
Neutrophils Relative %: 84 %
Platelets: 240 10*3/uL (ref 150–400)
RBC: 3.72 MIL/uL — ABNORMAL LOW (ref 4.22–5.81)
RDW: 19.7 % — ABNORMAL HIGH (ref 11.5–15.5)
WBC: 15.7 10*3/uL — ABNORMAL HIGH (ref 4.0–10.5)
nRBC: 0.4 % — ABNORMAL HIGH (ref 0.0–0.2)

## 2021-07-19 LAB — COMPREHENSIVE METABOLIC PANEL
ALT: 18 U/L (ref 0–44)
AST: 49 U/L — ABNORMAL HIGH (ref 15–41)
Albumin: 1.6 g/dL — ABNORMAL LOW (ref 3.5–5.0)
Alkaline Phosphatase: 68 U/L (ref 38–126)
Anion gap: 15 (ref 5–15)
BUN: 36 mg/dL — ABNORMAL HIGH (ref 6–20)
CO2: 27 mmol/L (ref 22–32)
Calcium: 8.8 mg/dL — ABNORMAL LOW (ref 8.9–10.3)
Chloride: 94 mmol/L — ABNORMAL LOW (ref 98–111)
Creatinine, Ser: 3.92 mg/dL — ABNORMAL HIGH (ref 0.61–1.24)
GFR, Estimated: 17 mL/min — ABNORMAL LOW (ref >60)
Glucose, Bld: 53 mg/dL — ABNORMAL LOW (ref 70–99)
Potassium: 4.5 mmol/L (ref 3.5–5.1)
Sodium: 136 mmol/L (ref 135–145)
Total Bilirubin: 6.7 mg/dL — ABNORMAL HIGH (ref 0.3–1.2)
Total Protein: 5.4 g/dL — ABNORMAL LOW (ref 6.5–8.1)

## 2021-07-19 LAB — LACTATE DEHYDROGENASE, PLEURAL OR PERITONEAL FLUID: LD, Fluid: 323 U/L — ABNORMAL HIGH (ref 3–23)

## 2021-07-19 LAB — GLUCOSE, CAPILLARY
Glucose-Capillary: 67 mg/dL — ABNORMAL LOW (ref 70–99)
Glucose-Capillary: 93 mg/dL (ref 70–99)

## 2021-07-19 LAB — PROTEIN, PLEURAL OR PERITONEAL FLUID: Total protein, fluid: <3 g/dL

## 2021-07-19 LAB — AMMONIA: Ammonia: 34 umol/L (ref 9–35)

## 2021-07-19 LAB — CBG MONITORING, ED: Glucose-Capillary: 88 mg/dL (ref 70–99)

## 2021-07-19 LAB — APTT: aPTT: 43 seconds — ABNORMAL HIGH (ref 24–36)

## 2021-07-19 LAB — RESP PANEL BY RT-PCR (FLU A&B, COVID) ARPGX2
Influenza A by PCR: NEGATIVE
Influenza B by PCR: NEGATIVE
SARS Coronavirus 2 by RT PCR: NEGATIVE

## 2021-07-19 LAB — GLUCOSE, PLEURAL OR PERITONEAL FLUID: Glucose, Fluid: <20 mg/dL

## 2021-07-19 LAB — PROTIME-INR
INR: 1.5 — ABNORMAL HIGH (ref 0.8–1.2)
Prothrombin Time: 18.1 seconds — ABNORMAL HIGH (ref 11.4–15.2)

## 2021-07-19 LAB — MRSA NEXT GEN BY PCR, NASAL: MRSA by PCR Next Gen: NOT DETECTED

## 2021-07-19 LAB — ALBUMIN, PLEURAL OR PERITONEAL FLUID: Albumin, Fluid: <1.5 g/dL

## 2021-07-19 LAB — LACTIC ACID, PLASMA
Lactic Acid, Venous: 3.2 mmol/L (ref 0.5–1.9)
Lactic Acid, Venous: 3.6 mmol/L (ref 0.5–1.9)

## 2021-07-19 MED ORDER — DEXTROSE 50 % IV SOLN
INTRAVENOUS | Status: AC
  Administered 2021-07-19: 25 mL
  Filled 2021-07-19: qty 50

## 2021-07-19 MED ORDER — ALBUMIN HUMAN 25 % IV SOLN
25.0000 g | Freq: Once | INTRAVENOUS | Status: AC
  Administered 2021-07-19: 25 g via INTRAVENOUS
  Filled 2021-07-19: qty 100

## 2021-07-19 MED ORDER — VANCOMYCIN VARIABLE DOSE PER UNSTABLE RENAL FUNCTION (PHARMACIST DOSING): Status: DC

## 2021-07-19 MED ORDER — DEXTROSE 50 % IV SOLN
25.0000 g | Freq: Once | INTRAVENOUS | Status: AC
  Administered 2021-07-19: 25 g via INTRAVENOUS
  Filled 2021-07-19: qty 50

## 2021-07-19 MED ORDER — PANTOPRAZOLE SODIUM 20 MG PO TBEC
20.0000 mg | DELAYED_RELEASE_TABLET | Freq: Every day | ORAL | Status: DC
  Administered 2021-07-20: 20 mg via ORAL
  Filled 2021-07-19: qty 1

## 2021-07-19 MED ORDER — LACTATED RINGERS IV BOLUS
1000.0000 mL | Freq: Once | INTRAVENOUS | Status: AC
  Administered 2021-07-19: 1000 mL via INTRAVENOUS

## 2021-07-19 MED ORDER — CHLORHEXIDINE GLUCONATE CLOTH 2 % EX PADS
6.0000 | MEDICATED_PAD | Freq: Every day | CUTANEOUS | Status: DC
  Administered 2021-07-19 – 2021-07-22 (×3): 6 via TOPICAL

## 2021-07-19 MED ORDER — LACTATED RINGERS IV SOLN: INTRAVENOUS | Status: DC

## 2021-07-19 MED ORDER — SODIUM CHLORIDE 0.9 % IV SOLN
2.0000 g | Freq: Once | INTRAVENOUS | Status: AC
  Administered 2021-07-19: 2 g via INTRAVENOUS
  Filled 2021-07-19: qty 2

## 2021-07-19 MED ORDER — SODIUM CHLORIDE 0.9 % IV SOLN
250.0000 mL | INTRAVENOUS | Status: DC
  Administered 2021-07-20 – 2021-07-21 (×2): 250 mL via INTRAVENOUS

## 2021-07-19 MED ORDER — VANCOMYCIN HCL 1750 MG/350ML IV SOLN
1750.0000 mg | Freq: Once | INTRAVENOUS | Status: AC
  Administered 2021-07-19: 1750 mg via INTRAVENOUS
  Filled 2021-07-19: qty 350

## 2021-07-19 MED ORDER — POLYETHYLENE GLYCOL 3350 17 G PO PACK: 17.0000 g | PACK | Freq: Every day | ORAL | Status: DC | PRN

## 2021-07-19 MED ORDER — HEPARIN SODIUM (PORCINE) 5000 UNIT/ML IJ SOLN
5000.0000 [IU] | Freq: Three times a day (TID) | INTRAMUSCULAR | Status: DC
  Administered 2021-07-19 – 2021-07-22 (×9): 5000 [IU] via SUBCUTANEOUS
  Filled 2021-07-19 (×9): qty 1

## 2021-07-19 MED ORDER — DOCUSATE SODIUM 100 MG PO CAPS: 100.0000 mg | ORAL_CAPSULE | Freq: Two times a day (BID) | ORAL | Status: DC | PRN

## 2021-07-19 MED ORDER — SODIUM CHLORIDE 0.9 % IV SOLN
1.0000 g | INTRAVENOUS | Status: DC
  Administered 2021-07-20 – 2021-07-21 (×2): 1 g via INTRAVENOUS
  Filled 2021-07-19 (×3): qty 1

## 2021-07-19 MED ORDER — NOREPINEPHRINE 4 MG/250ML-% IV SOLN
2.0000 ug/min | INTRAVENOUS | Status: DC
  Filled 2021-07-19: qty 250

## 2021-07-19 MED ORDER — PIPERACILLIN-TAZOBACTAM IN DEX 2-0.25 GM/50ML IV SOLN
2.2500 g | Freq: Three times a day (TID) | INTRAVENOUS | Status: DC
  Filled 2021-07-19 (×2): qty 50

## 2021-07-19 MED ORDER — LACTATED RINGERS IV BOLUS
500.0000 mL | Freq: Once | INTRAVENOUS | Status: AC
  Administered 2021-07-19: 500 mL via INTRAVENOUS

## 2021-07-19 MED ORDER — SODIUM CHLORIDE 0.9 % IV SOLN
1.0000 g | INTRAVENOUS | Status: DC
  Filled 2021-07-19: qty 1

## 2021-07-19 MED ORDER — INSULIN ASPART 100 UNIT/ML IJ SOLN: 0.0000 [IU] | INTRAMUSCULAR | Status: DC

## 2021-07-19 NOTE — Progress Notes (Signed)
eLink Physician-Brief Progress Note Patient Name: Brian [name] DOB: [birth year] MRN: [id number]   Date of Service  08/01/2021  HPI/Events of Note  Pt with ESRD and cirrhosis.  In ICU with hypotension.  Pt alert and currently having a paracentesis performed   eICU Interventions  In house team at bedside; pt's MAP is at goal without pressors.      Intervention Category Evaluation Type: New Patient Evaluation  [name] 07/11/2021, 9:51 PM

## 2021-07-19 NOTE — H&P (Addendum)
NAME:  Brian [name], MRN:  [id number], DOB:  Feb 19, 1961, LOS: 0 ADMISSION DATE:  07/31/2021, CONSULTATION DATE:  07/15/2021 REFERRING MD:  Tamsen Snider- EDP, CHIEF COMPLAINT:  hypotension   History of Present Illness:  Brian [name] is a 60 y/o gentleman with a history of HTN, ESRD, DM, cirrhosis who presented with confusion and fatigue that has been noticeable since earlier today per his son. He did well in PT, but after was more confused. Per triage notes, he has had declining health status since his BKA 2 weeks ago. Prior to his surgery he lived at home. He has been going to his HD sessions as scheduled, most recently yesterday on 10/12. He endorses some SOB since being laid in trendelenburg, but denies other complaints. No abdominal pain. He had follow up with orthopedics earlier this week with no concern for infection. He has a decubitus ulcer on his buttocks.   In the ED he was hypotensive with Bps 70-80s/50s. He was given 500cc IVF without response. Blood cultures have been drawn and vanc, cefepime started. His sister and son are at bedside. He does not have completed POA paperwork but brought it at the time of surgery and it was never notarized.   Pertinent  Medical History  ESRD Cirrhosis with portal gastritis, mild varices, last EGD 04/2021 R BKA for nonhealing fracture HTN DM Hemorrhagic stroke 2010 H/o splenectomy Tobacco abuse  Significant Hospital Events: Including procedures, antibiotic start and stop dates in addition to other pertinent events   8/13 Admitted  Interim History / Subjective:    Objective   Blood pressure (!) 79/57, pulse 65, temperature (!) 97.2 F (36.2 C), temperature source Rectal, resp. rate 15, height 5\' 9"  (1.753 m), weight 79 kg, SpO2 98 %.       No intake or output data in the 24 hours ending 08/05/2021 2104 Filed Weights   08/03/2021 1614  Weight: 79 kg    Examination: General: frail, chronically ill appearing man lying in bed in NAD HENT: temporal  wasting, dry oral mucosa Lungs: tachypnea, no accessory muscle use reduced R basilar breath sounds, otherwise CTA. No coughing. Cardiovascular: S1S2, RRR Abdomen: distended, soft, NT Extremities: pitting edema to bilateral thighs. R BKA incision healing well, staples still in place. LUE fistula with thrill, not warm or red. Neuro: sleeping but arouses to verbal stimulation, oriented to place and person, falls back asleep easily, globally weak Derm: jaudiced, decubitus ulcer with DTI and superficial skin breaks without significant erythema or warmth  LA 3.2 BUN 36 Cr 3.92 T bili 6.7 WBC 15.7 Platelets 240 Ammonia 34 INR 1.5 Covid negative  CXR personally reviewed> R dependent pleural effusion EKG: no ischemic changes  Resolved Hospital Problem list     Assessment & Plan:   Sepsis, concern for septic shock, likely source bacteremia vs SBP -sepsis fluid bolus -vasopressors as required to maintain MAP >65 -cefepime and vanc to continue -follow blood cultures -paracentesis to be performed to assess for SBP, cultures  Lactic acidosis due to sepsis -recheck after fluids  Acute metabolic encephalopathy due to sepsis -supportive care -hold potentially sedating medications  ESRD -nephrology consult; no urgent indications for HD currently -renal diet -AM labs  Hypoglycemia, concern this is due to sepsis H/o DM -accuchecks + D50w PRN -SSI PRN when needed -hold PTA 70/30 insulin  HTN -hold PTA antihypertensives  Cryptogenic cirrhosis, MELD-Na+ 32 Ascites Chronic portal gastropathy, 1 small esophageal varix Mild coagulopathy Hyperbilirubinemia; acutely likely decompensated due to sepsis -con't PTA PPI -  diagnostic para to assess for SBP; may need therapeutic para before discharge.  Sacral decubitus ulcer, POA -wound care  Tobacco abuse -needs counseling on the importance of cessation  GoC -Sister indicated that she and her husband are his POAs. They had discussed  this prior to his recent BKA, but the paperwork was not able to be notarized while he was in the hospital, so there is no official documentation at this time. He would want full aggressive care short term, but if it was deemed he would not recover he would not want life support indefinitely.  Best Practice (right click and "Reselect all SmartList Selections" daily)   Diet/type: NPO DVT prophylaxis: prophylactic heparin  GI prophylaxis: PPI Lines: N/A Foley:  N/A Code Status:  full code Last date of multidisciplinary goals of care discussion [sister, 10/13- full code]  Labs   CBC: Recent Labs  Lab 07/23/2021 1648  WBC 15.7*  NEUTROABS 13.1*  HGB 12.2*  HCT 37.2*  MCV 100.0  PLT 329    Basic Metabolic Panel: Recent Labs  Lab 08/02/2021 1648  NA 136  K 4.5  CL 94*  CO2 27  GLUCOSE 53*  BUN 36*  CREATININE 3.92*  CALCIUM 8.8*   GFR: Estimated Creatinine Clearance: 20.5 mL/min (A) (by C-G formula based on SCr of 3.92 mg/dL (H)). Recent Labs  Lab 07/08/2021 1648 07/08/2021 1649  WBC 15.7*  --   LATICACIDVEN  --  3.2*    Liver Function Tests: Recent Labs  Lab 07/25/2021 1648  AST 49*  ALT 18  ALKPHOS 68  BILITOT 6.7*  PROT 5.4*  ALBUMIN 1.6*   No results for input(s): LIPASE, AMYLASE in the last 168 hours. Recent Labs  Lab 07/16/2021 1650  AMMONIA 34    ABG    Component Value Date/Time   TCO2 31 07/06/2021 0954     Coagulation Profile: Recent Labs  Lab 07/31/2021 1648  INR 1.5*    Cardiac Enzymes: No results for input(s): CKTOTAL, CKMB, CKMBINDEX, TROPONINI in the last 168 hours.  HbA1C: Hgb A1c MFr Bld  Date/Time Value Ref Range Status  07/07/2021 03:26 AM 4.7 (L) 4.8 - 5.6 % Final    Comment:    (NOTE) Pre diabetes:          5.7%-6.4%  Diabetes:              >6.4%  Glycemic control for   <7.0% adults with diabetes   03/26/2020 11:38 AM 6.5 (H) 4.8 - 5.6 % Final    Comment:    (NOTE) Pre diabetes:          5.7%-6.4%  Diabetes:               >6.4%  Glycemic control for   <7.0% adults with diabetes     CBG: Recent Labs  Lab 07/30/2021 2020  GLUCAP 88    Review of Systems:   Limited due to encephalopathy.  Past Medical History:  He,  has a past medical history of Allergy, Anemia, Arthritis, Bleeding ulcer, Cirrhosis ([place]), Complication of anesthesia, Diabetes mellitus without complication ([place]), Diabetic neuropathy ([place]), Duodenal ulcer, Elevated TSH, ESRD on dialysis ([place]), History of hiatal hernia, History of kidney stones, Hypertension, Portal hypertension ([place]), Stress ulcer, Stroke ([place]), and Tobacco abuse.   Surgical History:   Past Surgical History:  Procedure Laterality Date   A/V FISTULAGRAM N/A 02/05/2018   Procedure: A/V FISTULAGRAM;  Surgeon: Conrad [place], MD;  Location: [place] [facility];  Service: Cardiovascular;  Laterality: N/A;   AMPUTATION Right 03/27/2020   Procedure: AMPUTATION RAY 5th;  Surgeon: Trula Slade, DPM;  Location: [place];  Service: Podiatry;  Laterality: Right;   AMPUTATION Right 07/06/2021   Procedure: RIGHT BELOW KNEE AMPUTATION;  Surgeon: Newt Minion, MD;  Location: [place];  Service: Orthopedics;  Laterality: Right;   APPLICATION OF WOUND VAC  07/06/2021   Procedure: APPLICATION OF WOUND VAC;  Surgeon: Newt Minion, MD;  Location: [place];  Service: Orthopedics;;   AV FISTULA PLACEMENT Left 05/16/2017   Procedure: ARTERIOVENOUS (AV) FISTULA CREATION-LEFT;  Surgeon: Angelia Mould, MD;  Location: [facility] OR;  Service: Vascular;  Laterality: Left;   CHOLECYSTECTOMY     COLONOSCOPY     ESOPHAGOGASTRODUODENOSCOPY N/A 01/23/2018   Procedure: ESOPHAGOGASTRODUODENOSCOPY (EGD);  Surgeon: Yetta Flock, MD;  Location: [facility] ENDOSCOPY;  Service: Gastroenterology;  Laterality: N/A;   LITHOTRIPSY     PANCREAS SURGERY     PERIPHERAL VASCULAR BALLOON ANGIOPLASTY  02/05/2018   Procedure: PERIPHERAL VASCULAR BALLOON ANGIOPLASTY;  Surgeon: Conrad [place], MD;  Location: [place] [facility];  Service: Cardiovascular;;  left AV fistula   SPLENECTOMY     WOUND DEBRIDEMENT Right 03/30/2020   Procedure: DEBRIDEMENT WOUND RIGHT FOOT;  Surgeon: Trula Slade, DPM;  Location: [place];  Service: Podiatry;  Laterality: Right;     Social History:   reports that he has been smoking cigarettes. He has a 20.00 pack-year smoking history. He has never used smokeless tobacco. He reports that he does not drink alcohol and does not use drugs.   Family History:  His family history includes Diabetes Mellitus I in his brother; Diabetes Mellitus II in his father; Hypertension in his brother, brother, and father; Lymphoma in his mother. There is no history of Colon cancer, Stomach cancer, Colon polyps, Esophageal cancer, or Rectal cancer.   Allergies Allergies  Allergen Reactions   Penicillins Hives and Rash    Has patient had a PCN reaction causing immediate rash, facial/tongue/throat swelling, SOB or lightheadedness with hypotension: Yes Has patient had a PCN reaction causing severe rash involving mucus membranes or skin necrosis: No Has patient had a PCN reaction that required hospitalization: No Has patient had a PCN reaction occurring within the last 10 years: No If all of the above answers are "NO", then may proceed with Cephalosporin use. Ceftriaxone = no issues   Metoprolol Other (See Comments)    Cause inflamed stomach   Nsaids     Due to kidneys   Aspirin Nausea And Vomiting    Other Reaction: GI Upset Bleeding ulcer     Home Medications  Prior to Admission medications   Medication Sig Start Date End Date Taking? Authorizing Provider  alum & mag hydroxide-simeth (MYLANTA) 200-200-20 MG/5ML suspension Take 15 mLs by mouth in the morning and at bedtime.   Yes [provider]  amLODipine (NORVASC) 10 MG tablet Take 1 tablet (10 mg total) by mouth daily. Patient taking differently: Take 10 mg by mouth at bedtime. 04/02/20  Yes Thurnell Lose, MD  insulin aspart  protamine- aspart (NOVOLOG MIX 70/30) (70-30) 100 UNIT/ML injection Inject 0.05 mLs (5 Units total) into the skin at bedtime. Patient taking differently: Inject 5 Units into the skin at bedtime as needed (high blood sugar). Depending on blood sugar. 02/07/17  Yes Eugenie Filler, MD  loperamide (IMODIUM) 2 MG capsule Take 2 mg by mouth every 6 (six) hours as needed for diarrhea or loose stools.  Yes [provider]  nadolol (CORGARD) 20 MG tablet Take 20 mg by mouth at bedtime.   Yes [provider]  ondansetron (ZOFRAN) 4 MG tablet Take 4 mg by mouth every 8 (eight) hours as needed for nausea.   Yes [provider]  oxyCODONE-acetaminophen (PERCOCET/ROXICET) 5-325 MG tablet Take 1 tablet by mouth every 4 (four) hours as needed. Patient taking differently: Take 1 tablet by mouth every 4 (four) hours as needed for moderate pain. 07/10/21  Yes Newt Minion, MD  pantoprazole (PROTONIX) 20 MG tablet Take 1 tablet by mouth once daily 06/20/21  Yes Armbruster, Carlota Raspberry, MD  nadolol (CORGARD) 40 MG tablet Take 1 tablet (40 mg total) by mouth daily. Patient not taking: No sig reported 05/01/21   Yetta Flock, MD  sevelamer carbonate (RENVELA) 800 MG tablet Take 1,600 mg by mouth 3 (three) times daily. Patient not taking: Reported on 07/11/2021 07/03/21   [provider]     Critical care time: 55 min.    Julian Hy, DO 07/23/2021 9:06 PM [facility]

## 2021-07-19 NOTE — Procedures (Signed)
Paracentesis Procedure Note  Brian [name]  [id number]  [birth year]  Date:07/07/2021  Time:9:58 PM   Provider Performing:Derin Matthes P Carlis Abbott    Procedure: Paracentesis with imaging guidance (34373)  Indication(s) Ascites  Consent Risks of the procedure as well as the alternatives and risks of each were explained to the patient and/or caregiver.  Consent for the procedure was obtained and is signed in the bedside chart. Sister consented for procedure.  Anesthesia Topical only with 1% lidocaine    Time Out Verified patient identification, verified procedure, site/side was marked, verified correct patient position, special equipment/implants available, medications/allergies/relevant history reviewed, required imaging and test results available.   Sterile Technique Maximal sterile technique including full sterile barrier drape, hand hygiene, sterile gown, sterile gloves, mask, hair covering, sterile ultrasound probe cover (if used).   Procedure Description Ultrasound used to identify appropriate peritoneal anatomy for placement and overlying skin marked.  Septations present in fluid. Area of drainage cleaned and draped in sterile fashion. Lidocaine was used to anesthetize the skin and subcutaneous tissue.  70 cc's of amber appearing fluid was drained. Catheter then removed and bandaid applied to site.   Complications/Tolerance None; patient tolerated the procedure well.   EBL Minimal   Specimen(s) Peritoneal fluid-- culture, cell count with diff, LDH, glucose, albumin, protein  Julian Hy, DO 07/17/2021 9:59 PM [facility]

## 2021-07-19 NOTE — ED Triage Notes (Signed)
Pt BIB [facility] from [facility]. Pt had a BKA 2 weeks ago and has since had a slow decline in health status. Pt has had increased lethargy and a pressure ulcer on his back. Cirrhosis of the liver. Denies CP/SOB/abd pain. Dialysis pt, MWF, has not missed a tx since last Monday.

## 2021-07-19 NOTE — ED Provider Notes (Signed)
[facility] EMERGENCY DEPARTMENT Provider Note   CSN: [id number] Arrival date & time: 07/20/2021  1550     History Chief Complaint  Patient presents with   Weakness    Brian [name] is a 60 y.o. male.  HPI This is a 60 year old male with history of cirrhosis, ESRD on dialysis (Monday Wednesday Friday), diabetes, prior stroke, and recent BKA who presents with altered mental status and lethargy.  History provided by patient and sister.  Reportedly had BKA performed 2 weeks ago without complication.  Has had outpatient follow-up with good wound healing.  Over the weekend patient developed some intermittent, waxing and waning confusion and increasing lethargy.  Sister states patient is oriented to self and place but confused on further questioning about daily activities.  The both deny fever, chills, cough, congestion, vomiting, diarrhea.  He denies chest pain or shortness of breath.  Reports compliance with dialysis and medications.  Denies any recent falls.    Past Medical History:  Diagnosis Date   Allergy    Anemia    Arthritis    Bleeding ulcer    Cirrhosis ([place])    w/ esophageal varices   Complication of anesthesia    " I had a nose bleed a day or two after surgery-it could have been the oxygen"   Diabetes mellitus without complication ([place])    type II   Diabetic neuropathy (HCC)    Duodenal ulcer    Elevated TSH    ESRD on dialysis ([place])    MWF   History of hiatal hernia    History of kidney stones    Hypertension    Portal hypertension (HCC)    Stress ulcer    after surgery- "only one time"  patient is on pantoprazole daily   Stroke [facility])    hemorrhagic 2010   Tobacco abuse     Patient Active Problem List   Diagnosis Date Noted   Pressure injury of skin 07/07/2021   Closed right ankle fracture, with nonunion, subsequent encounter 07/06/2021   Charcot's joint of right ankle    Personal history of diabetic foot ulcer 04/11/2020   Diabetic  infection of right foot ([place]) 03/26/2020   Headache, unspecified 09/10/2019   Diarrhea, unspecified 08/16/2019   Encounter for removal of sutures 07/28/2018   Cryptogenic cirrhosis ([place]) 07/16/2018   Encounter for immunization 07/01/2018   Gastritis 01/23/2018   Acute blood loss anemia 01/23/2018   Duodenal ulcer    Upper GI bleed 01/22/2018   History of CVA (cerebrovascular accident) 01/15/2018   Iron deficiency anemia, unspecified 11/25/2017   Coagulation defect, unspecified ([place]) 11/18/2017   Unspecified protein-calorie malnutrition ([place]) 11/11/2017   Acquired absence of spleen 10/30/2017   Nicotine dependence, unspecified, uncomplicated 79/11/4095   Other disorders of phosphorus metabolism 10/30/2017   Unspecified nephritic syndrome with unspecified morphologic changes 10/30/2017   Anemia in chronic kidney disease 10/24/2017   Dependence on renal dialysis ([place]) 10/24/2017   Encounter for screening for respiratory tuberculosis 10/24/2017   Pruritus, unspecified 10/24/2017   Secondary hyperparathyroidism of renal origin ([place]) 10/24/2017   Shortness of breath 10/24/2017   Type 1 diabetes mellitus with kidney complication ([place]) - on insulin 05/01/2017   Hemorrhagic stroke [facility])- Jan 2010 05/01/2017   H/O hyperlipidemia- sev yrs ago 05/01/2017   h/o Hypertriglyceridemia- 13000's in past 05/01/2017   H/O splenectomy 05/01/2017   Status post cholecystectomy 05/01/2017   Gout- L foot- mult times 05/01/2017   Diabetic neuropathy ([place])- 6 mo or  so; never started meds 05/01/2017   Vitamin D deficiency 05/01/2017   ESRD on dialysis ([place]) 02/05/2017   Anasarca- may 2018 hosp 02/05/2017   Leukocytosis- s/p plenectomy 02/05/2017   Hypertension    Diabetes mellitus without complication (HCC)    Tobacco abuse- >er than 55 pack yr hx; current smoker     Past Surgical History:  Procedure Laterality Date   A/V FISTULAGRAM N/A 02/05/2018   Procedure: A/V FISTULAGRAM;  Surgeon: Conrad [place], MD;  Location: [place] CV LAB;  Service: Cardiovascular;  Laterality: N/A;   AMPUTATION Right 03/27/2020   Procedure: AMPUTATION RAY 5th;  Surgeon: Trula Slade, DPM;  Location: [place];  Service: Podiatry;  Laterality: Right;   AMPUTATION Right 07/06/2021   Procedure: RIGHT BELOW KNEE AMPUTATION;  Surgeon: Newt Minion, MD;  Location: [place];  Service: Orthopedics;  Laterality: Right;   APPLICATION OF WOUND VAC  07/06/2021   Procedure: APPLICATION OF WOUND VAC;  Surgeon: Newt Minion, MD;  Location: [place];  Service: Orthopedics;;   AV FISTULA PLACEMENT Left 05/16/2017   Procedure: ARTERIOVENOUS (AV) FISTULA CREATION-LEFT;  Surgeon: Angelia Mould, MD;  Location: [facility] OR;  Service: Vascular;  Laterality: Left;   CHOLECYSTECTOMY     COLONOSCOPY     ESOPHAGOGASTRODUODENOSCOPY N/A 01/23/2018   Procedure: ESOPHAGOGASTRODUODENOSCOPY (EGD);  Surgeon: Yetta Flock, MD;  Location: [facility] ENDOSCOPY;  Service: Gastroenterology;  Laterality: N/A;   LITHOTRIPSY     PANCREAS SURGERY     PERIPHERAL VASCULAR BALLOON ANGIOPLASTY  02/05/2018   Procedure: PERIPHERAL VASCULAR BALLOON ANGIOPLASTY;  Surgeon: Conrad [place], MD;  Location: [place] [facility];  Service: Cardiovascular;;  left AV fistula   SPLENECTOMY     WOUND DEBRIDEMENT Right 03/30/2020   Procedure: DEBRIDEMENT WOUND RIGHT FOOT;  Surgeon: Trula Slade, DPM;  Location: [place];  Service: Podiatry;  Laterality: Right;       Family History  Problem Relation Age of Onset   Lymphoma Mother    Hypertension Father    Diabetes Mellitus II Father    Hypertension Brother    Diabetes Mellitus I Brother    Hypertension Brother    Colon cancer Neg Hx    Stomach cancer Neg Hx    Colon polyps Neg Hx    Esophageal cancer Neg Hx    Rectal cancer Neg Hx     Social History   Tobacco Use   Smoking status: Every Day    Packs/day: 0.50    Years: 40.00    Pack years: 20.00    Types: Cigarettes   Smokeless tobacco: Never   Vaping Use   Vaping Use: Never used  Substance Use Topics   Alcohol use: No    Comment: quit around 2010; no h/o heavy use   Drug use: No    Home Medications Prior to Admission medications   Medication Sig Start Date End Date Taking? Authorizing Provider  acetaminophen (TYLENOL) 650 MG CR tablet Take 650 mg by mouth every 8 (eight) hours as needed for pain.    [provider]  amLODipine (NORVASC) 10 MG tablet Take 1 tablet (10 mg total) by mouth daily. 04/02/20   Thurnell Lose, MD  insulin aspart protamine- aspart (NOVOLOG MIX 70/30) (70-30) 100 UNIT/ML injection Inject 0.05 mLs (5 Units total) into the skin at bedtime. Patient taking differently: Inject 5 Units into the skin at bedtime as needed (high blood sugar). Depending on blood sugar. 02/07/17   Eugenie Filler,  MD  loperamide (IMODIUM) 2 MG capsule Take 2 mg by mouth as needed for diarrhea or loose stools.    [provider]  nadolol (CORGARD) 40 MG tablet Take 1 tablet (40 mg total) by mouth daily. 05/01/21   Armbruster, Carlota Raspberry, MD  oxyCODONE-acetaminophen (PERCOCET/ROXICET) 5-325 MG tablet Take 1 tablet by mouth every 4 (four) hours as needed. 07/10/21   Newt Minion, MD  pantoprazole (PROTONIX) 20 MG tablet Take 1 tablet by mouth once daily 06/20/21   Armbruster, Carlota Raspberry, MD  sevelamer carbonate (RENVELA) 800 MG tablet Take 1,600 mg by mouth 3 (three) times daily. 07/03/21   [provider]    Allergies    Penicillins, Metoprolol, Nsaids, and Aspirin  Review of Systems   Review of Systems  Constitutional:  Positive for fatigue. Negative for chills and fever.  HENT:  Negative for ear pain and sore throat.   Eyes:  Negative for pain and visual disturbance.  Respiratory:  Negative for cough and shortness of breath.   Cardiovascular:  Negative for chest pain and palpitations.  Gastrointestinal:  Negative for abdominal pain and vomiting.  Genitourinary:  Negative for dysuria and hematuria.   Musculoskeletal:  Positive for back pain. Negative for arthralgias.  Skin:  Negative for color change and rash.  Neurological:  Negative for seizures and syncope.  All other systems reviewed and are negative.  Physical Exam Updated Vital Signs BP (!) 84/57   Pulse 64   Temp 98 F (36.7 C) (Oral)   Resp (!) 26   Ht 5\' 9"  (1.753 m)   Wt 79 kg   SpO2 93%   BMI 25.72 kg/m   Physical Exam Vitals and nursing note reviewed.  Constitutional:      Appearance: He is well-developed.  HENT:     Head: Normocephalic and atraumatic.  Eyes:     Conjunctiva/sclera: Conjunctivae normal.  Cardiovascular:     Rate and Rhythm: Normal rate and regular rhythm.     Heart sounds: No murmur heard. Pulmonary:     Effort: Pulmonary effort is normal. No respiratory distress.     Breath sounds: Normal breath sounds.  Abdominal:     General: There is distension.     Palpations: Abdomen is soft.     Tenderness: There is no abdominal tenderness.  Musculoskeletal:     Cervical back: Neck supple.     Left lower leg: Edema present.     Comments: RLE sugical wound with mild surrounding erythema, no drainage or fluctuance. Pictures in chart.   Skin:    General: Skin is warm and dry.  Neurological:     Mental Status: He is alert.    ED Results / Procedures / Treatments   Labs (all labs ordered are listed, but only abnormal results are displayed) Labs Reviewed  COMPREHENSIVE METABOLIC PANEL - Abnormal; Notable for the following components:      Result Value   Chloride 94 (*)    Glucose, Bld 53 (*)    BUN 36 (*)    Creatinine, Ser 3.92 (*)    Calcium 8.8 (*)    Total Protein 5.4 (*)    Albumin 1.6 (*)    AST 49 (*)    Total Bilirubin 6.7 (*)    GFR, Estimated 17 (*)    All other components within normal limits  CBC WITH DIFFERENTIAL/PLATELET - Abnormal; Notable for the following components:   WBC 15.7 (*)    RBC 3.72 (*)    Hemoglobin  12.2 (*)    HCT 37.2 (*)    RDW 19.7 (*)    nRBC 0.4  (*)    Neutro Abs 13.1 (*)    Monocytes Absolute 1.4 (*)    Abs Immature Granulocytes 0.18 (*)    All other components within normal limits  PROTIME-INR - Abnormal; Notable for the following components:   Prothrombin Time 18.1 (*)    INR 1.5 (*)    All other components within normal limits  APTT - Abnormal; Notable for the following components:   aPTT 43 (*)    All other components within normal limits  CULTURE, BLOOD (ROUTINE X 2)  CULTURE, BLOOD (ROUTINE X 2)  RESP PANEL BY RT-PCR (FLU A&B, COVID) ARPGX2  AMMONIA  LACTIC ACID, PLASMA  LACTIC ACID, PLASMA    EKG EKG Interpretation  Date/Time:  Thursday July 19 2021 17:24:16 EDT Ventricular Rate:  65 PR Interval:  166 QRS Duration: 105 QT Interval:  466 QTC Calculation: 485 R Axis:   -46 Text Interpretation: Sinus rhythm Low voltage, extremity leads Borderline prolonged QT interval Confirmed by Lajean Saver [phone]) on 07/07/2021 6:30:48 PM  Radiology DG Chest Port 1 View  Result Date: 07/26/2021 CLINICAL DATA:  Shortness of breath and weakness. EXAM: PORTABLE CHEST 1 VIEW COMPARISON:  Feb 05, 2017 FINDINGS: Low lung volumes are noted. Very mild atelectasis is seen within the right lung base. There is a small right pleural effusion. Mild elevation of the right hemidiaphragm is seen. No pneumothorax is identified. The cardiac silhouette is enlarged and unchanged in size. Surgical clips are seen overlying the left upper quadrant. The visualized skeletal structures are unremarkable. IMPRESSION: Low lung volumes with mild right basilar atelectasis and small right pleural effusion. Electronically Signed   By: Virgina Norfolk M.D.   On: 07/16/2021 17:16    Procedures Ultrasound ED Peripheral IV (Provider)  Date/Time: 07/22/2021 4:46 PM Performed by: Coralee Pesa, MD Authorized by: Lajean Saver, MD   Procedure details:    Indications: hypotension and multiple failed IV attempts     Location:  Right AC   Angiocath:   20 G   Bedside Ultrasound Guided: Yes     Images: not archived     Patient tolerated procedure without complications: Yes     Dressing applied: Yes     Medications Ordered in ED Medications  lactated ringers bolus 500 mL (has no administration in time range)  dextrose 50 % solution 25 g (has no administration in time range)  lactated ringers bolus 500 mL (500 mLs Intravenous New Bag/Given 07/10/2021 1724)    ED Course  I have reviewed the triage vital signs and the nursing notes.  Pertinent labs & imaging results that were available during my care of the patient were reviewed by me and considered in my medical decision making (see chart for details).    MDM Rules/Calculators/A&P                          On arrival patient is hypotensive to 80s over 65s.  He is alert and oriented x3 with mild confusion about recent events but otherwise is mentating appropriately.  He has a nonfocal neurologic exam, low suspicion for acute ischemic or hemorrhagic stroke.  Given hypotension and altered mentation, high suspicion for sepsis.  No clear infectious source on exam.  Patient has a well-healing surgical wound on the right lower extremity as pictured in the chart without any purulent drainage or  areas of fluctuance.  Sacral wound has mild surrounding erythema but no drainage and appears to be well cared for.  Lungs are clear and patient has no new oxygen requirement.  Abdomen is distended but soft and nontender.  Low suspicion for SBP.  Broad work-up initiated including blood cultures and lactate.  White count is elevated at 15.7 consistent with likely infection.  Otherwise CBC is unremarkable with a stable hemoglobin at 12.  CMP with glucose of 53, given D50, but otherwise unremarkable.  Chest x-ray with right atelectasis and pleural effusion, possible source of infection although no pulmonary symptoms. COVID and flu negative.   Given 500 cc of fluids on arrival.  Initial lactate is elevated at 3.2 and  blood pressure is not improved.  Given additional 500 cc of fluids without improvement in blood pressure.  Consulted intensivist for admission and they recommend full 30 cc/kg of fluids despite ESRD status, additional 1 L ordered for total of 2 L of fluids.  Initiated on broad-spectrum antibiotics, vancomycin and cefepime.  Has a documented allergy to penicillins patient has tolerated cefepime in the past.  Admitted to intensivist. Clinical condition unchanged, remains hypotensive but mentating same.   Final Clinical Impression(s) / ED Diagnoses Final diagnoses:  Sepsis without acute organ dysfunction, due to unspecified organism ([place])  Hypotension, unspecified hypotension type  Hypoglycemia    Rx / DC Orders ED Discharge Orders     None        Coralee Pesa, MD 07/14/2021 2119    Lajean Saver, MD 07/23/21 [phone]

## 2021-07-19 NOTE — Progress Notes (Signed)
Pharmacy Antibiotic Note  Brian [name] is a 60 y.o. male admitted on 07/16/2021 with sepsis. PMH includes recent BKA 2 weeks prior and ESRD on iHD MWF. Pharmacy has been consulted for vancomycin and cefepime dosing.   Plan: Cefepime 2g IV once followed by cefepime 1g IV q24h  Vancomycin 1750 mg x1 loading dose  Start vancomycin 750 mg IV with iHD - will await nephrology plans for timing of iHD before ordering maintenance doses.  Monitor cultures and iHD schedule    Height: 5\' 9"  (175.3 cm) Weight: 79 kg (174 lb 2.6 oz) IBW/kg (Calculated) : 70.7  Temp (24hrs), Avg:97.6 F (36.4 C), Min:97.2 F (36.2 C), Max:98 F (36.7 C)  Recent Labs  Lab 08/02/2021 1648 07/10/2021 1649  WBC 15.7*  --   CREATININE 3.92*  --   LATICACIDVEN  --  3.2*    Estimated Creatinine Clearance: 20.5 mL/min (A) (by C-G formula based on SCr of 3.92 mg/dL (H)).    Allergies  Allergen Reactions   Penicillins Hives and Rash    Has patient had a PCN reaction causing immediate rash, facial/tongue/throat swelling, SOB or lightheadedness with hypotension: Yes Has patient had a PCN reaction causing severe rash involving mucus membranes or skin necrosis: No Has patient had a PCN reaction that required hospitalization: No Has patient had a PCN reaction occurring within the last 10 years: No If all of the above answers are "NO", then may proceed with Cephalosporin use. Ceftriaxone = no issues   Metoprolol Other (See Comments)    Cause inflamed stomach   Nsaids     Due to kidneys   Aspirin Nausea And Vomiting    Other Reaction: GI Upset Bleeding ulcer    Antimicrobials this admission: Vancomycin 10/13 >>  Cefepime 10/13 >>  Dose adjustments this admission:   Microbiology results: 10/13 bcx:  Thank you for allowing pharmacy to be a part of this patient's care.  Cristela Felt, PharmD, BCPS Clinical Pharmacist 07/28/2021 7:26 PM

## 2021-07-20 ENCOUNTER — Inpatient Hospital Stay (HOSPITAL_COMMUNITY): Payer: Medicare Other

## 2021-07-20 DIAGNOSIS — E43 Unspecified severe protein-calorie malnutrition: Secondary | ICD-10-CM | POA: Diagnosis present

## 2021-07-20 DIAGNOSIS — N186 End stage renal disease: Secondary | ICD-10-CM | POA: Diagnosis not present

## 2021-07-20 DIAGNOSIS — R6521 Severe sepsis with septic shock: Secondary | ICD-10-CM | POA: Diagnosis not present

## 2021-07-20 DIAGNOSIS — A419 Sepsis, unspecified organism: Secondary | ICD-10-CM | POA: Diagnosis not present

## 2021-07-20 LAB — CBC
HCT: 34.6 % — ABNORMAL LOW (ref 39.0–52.0)
Hemoglobin: 11.9 g/dL — ABNORMAL LOW (ref 13.0–17.0)
MCH: 33.2 pg (ref 26.0–34.0)
MCHC: 34.4 g/dL (ref 30.0–36.0)
MCV: 96.6 fL (ref 80.0–100.0)
Platelets: 239 10*3/uL (ref 150–400)
RBC: 3.58 MIL/uL — ABNORMAL LOW (ref 4.22–5.81)
RDW: 19.7 % — ABNORMAL HIGH (ref 11.5–15.5)
WBC: 17.5 10*3/uL — ABNORMAL HIGH (ref 4.0–10.5)
nRBC: 0.5 % — ABNORMAL HIGH (ref 0.0–0.2)

## 2021-07-20 LAB — GLUCOSE, CAPILLARY
Glucose-Capillary: 104 mg/dL — ABNORMAL HIGH (ref 70–99)
Glucose-Capillary: 76 mg/dL (ref 70–99)
Glucose-Capillary: 70 mg/dL (ref 70–99)
Glucose-Capillary: 116 mg/dL — ABNORMAL HIGH (ref 70–99)
Glucose-Capillary: 46 mg/dL — ABNORMAL LOW (ref 70–99)
Glucose-Capillary: 73 mg/dL (ref 70–99)
Glucose-Capillary: 66 mg/dL — ABNORMAL LOW (ref 70–99)
Glucose-Capillary: 117 mg/dL — ABNORMAL HIGH (ref 70–99)
Glucose-Capillary: 63 mg/dL — ABNORMAL LOW (ref 70–99)

## 2021-07-20 LAB — BASIC METABOLIC PANEL
Anion gap: 10 (ref 5–15)
BUN: 34 mg/dL — ABNORMAL HIGH (ref 6–20)
CO2: 29 mmol/L (ref 22–32)
Calcium: 9.1 mg/dL (ref 8.9–10.3)
Chloride: 95 mmol/L — ABNORMAL LOW (ref 98–111)
Creatinine, Ser: 3.74 mg/dL — ABNORMAL HIGH (ref 0.61–1.24)
GFR, Estimated: 18 mL/min — ABNORMAL LOW (ref >60)
Glucose, Bld: 86 mg/dL (ref 70–99)
Potassium: 3.9 mmol/L (ref 3.5–5.1)
Sodium: 134 mmol/L — ABNORMAL LOW (ref 135–145)

## 2021-07-20 LAB — HIV ANTIBODY (ROUTINE TESTING W REFLEX): HIV Screen 4th Generation wRfx: NONREACTIVE

## 2021-07-20 LAB — GRAM STAIN

## 2021-07-20 LAB — BODY FLUID CELL COUNT WITH DIFFERENTIAL
Lymphs, Fluid: 2 %
Monocyte-Macrophage-Serous Fluid: 3 % — ABNORMAL LOW (ref 50–90)
Neutrophil Count, Fluid: 95 % — ABNORMAL HIGH (ref 0–25)
Total Nucleated Cell Count, Fluid: 3605 cu mm — ABNORMAL HIGH (ref 0–1000)

## 2021-07-20 LAB — PATHOLOGIST SMEAR REVIEW

## 2021-07-20 LAB — PHOSPHORUS
Phosphorus: 4.2 mg/dL (ref 2.5–4.6)
Phosphorus: 4.5 mg/dL (ref 2.5–4.6)

## 2021-07-20 LAB — MAGNESIUM
Magnesium: 2 mg/dL (ref 1.7–2.4)
Magnesium: 2 mg/dL (ref 1.7–2.4)

## 2021-07-20 MED ORDER — PROSOURCE PLUS PO LIQD
30.0000 mL | Freq: Two times a day (BID) | ORAL | Status: DC
  Filled 2021-07-20 (×2): qty 30

## 2021-07-20 MED ORDER — WHITE PETROLATUM EX OINT
TOPICAL_OINTMENT | CUTANEOUS | Status: AC
  Filled 2021-07-20: qty 28.35

## 2021-07-20 MED ORDER — VITAL 1.5 CAL PO LIQD: 1000.0000 mL | ORAL | Status: DC

## 2021-07-20 MED ORDER — PROSOURCE TF PO LIQD
45.0000 mL | Freq: Two times a day (BID) | ORAL | Status: DC
  Administered 2021-07-20: 45 mL
  Filled 2021-07-20: qty 45

## 2021-07-20 MED ORDER — SEVELAMER CARBONATE 800 MG PO TABS
1600.0000 mg | ORAL_TABLET | Freq: Three times a day (TID) | ORAL | Status: DC
  Administered 2021-07-20: 1600 mg via ORAL
  Filled 2021-07-20: qty 2

## 2021-07-20 MED ORDER — DEXTROSE 50 % IV SOLN
INTRAVENOUS | Status: AC
  Administered 2021-07-20: 12.5 g via INTRAVENOUS
  Filled 2021-07-20: qty 50

## 2021-07-20 MED ORDER — NEPRO/CARBSTEADY PO LIQD
237.0000 mL | Freq: Three times a day (TID) | ORAL | Status: DC
  Administered 2021-07-20: 237 mL via ORAL

## 2021-07-20 MED ORDER — MIDODRINE HCL 5 MG PO TABS
5.0000 mg | ORAL_TABLET | Freq: Three times a day (TID) | ORAL | Status: DC
  Administered 2021-07-21: 5 mg
  Filled 2021-07-20: qty 1

## 2021-07-20 MED ORDER — DEXTROSE 50 % IV SOLN: 12.5000 g | INTRAVENOUS | Status: AC

## 2021-07-20 MED ORDER — SEVELAMER CARBONATE 800 MG PO TABS: 1600.0000 mg | ORAL_TABLET | Freq: Three times a day (TID) | ORAL | Status: DC

## 2021-07-20 MED ORDER — PANTOPRAZOLE 2 MG/ML SUSPENSION
20.0000 mg | Freq: Every day | ORAL | Status: DC
  Administered 2021-07-20: 20 mg
  Filled 2021-07-20: qty 20

## 2021-07-20 MED ORDER — RENA-VITE PO TABS: 1.0000 | ORAL_TABLET | Freq: Every day | ORAL | Status: DC

## 2021-07-20 MED ORDER — MIDODRINE HCL 5 MG PO TABS
5.0000 mg | ORAL_TABLET | Freq: Three times a day (TID) | ORAL | Status: DC
  Administered 2021-07-20 (×2): 5 mg via ORAL
  Filled 2021-07-20 (×2): qty 1

## 2021-07-20 MED ORDER — POLYETHYLENE GLYCOL 3350 17 G PO PACK: 17.0000 g | PACK | Freq: Every day | ORAL | Status: DC | PRN

## 2021-07-20 MED ORDER — NEPRO/CARBSTEADY PO LIQD: 237.0000 mL | Freq: Three times a day (TID) | ORAL | Status: DC

## 2021-07-20 MED ORDER — DEXTROSE 50 % IV SOLN
INTRAVENOUS | Status: AC
  Filled 2021-07-20: qty 50

## 2021-07-20 MED ORDER — RENA-VITE PO TABS
1.0000 | ORAL_TABLET | Freq: Every day | ORAL | Status: DC
  Administered 2021-07-20: 1
  Filled 2021-07-20: qty 1

## 2021-07-20 MED ORDER — DOCUSATE SODIUM 50 MG/5ML PO LIQD: 100.0000 mg | Freq: Two times a day (BID) | ORAL | Status: DC | PRN

## 2021-07-20 MED ORDER — DEXTROSE 50 % IV SOLN
25.0000 g | INTRAVENOUS | Status: AC
  Administered 2021-07-20: 25 g via INTRAVENOUS

## 2021-07-20 MED ORDER — OSMOLITE 1.5 CAL PO LIQD
1000.0000 mL | ORAL | Status: DC
  Administered 2021-07-20: 1000 mL
  Filled 2021-07-20: qty 1000

## 2021-07-20 NOTE — Progress Notes (Signed)
Hypoglycemic Event  CBG: 63  Treatment: D50 66ml(12 gm)  Symptoms: slow to respond voices hungry, but wont drink/eat  Follow-up CBG: Time:2054 CBG Result:117  Possible Reasons for Event: inadequate PO intake  Comments/MD notified:Elink notified,  Tube feeding started    Brian [name] Menius [name]

## 2021-07-20 NOTE — Procedures (Signed)
Cortrak  Person Inserting Tube:  Alroy Dust, Emmali Karow L, RD Tube Type:  Cortrak - 43 inches Tube Size:  10 Tube Location:  Right nare Initial Placement:  Stomach Secured by: Bridle Technique Used to Measure Tube Placement:  Marking at nare/corner of mouth Cortrak Secured At:  75 cm  Cortrak Tube Team Note:  Consult received to place a Cortrak feeding tube.   X-ray is required, abdominal x-ray has been ordered by the Cortrak team. Please confirm tube placement before using the Cortrak tube.   If the tube becomes dislodged please keep the tube and contact the Cortrak team at [email or web address] (password [email or web address]) for replacement.  If after hours and replacement cannot be delayed, place a NG tube and confirm placement with an abdominal x-ray.    Hermina Barters BS, PLDN Clinical Dietitian See [facility] for contact information.

## 2021-07-20 NOTE — Consult Note (Signed)
[facility] Renal Consultation Note    Indication for Consultation:  Management of ESRD/hemodialysis, anemia, hypertension/volume, and secondary hyperparathyroidism. PCP:  HPI: Brian [name] is a 60 y.o. male with ESRD, T2DM, HTN, Hx CVA, cirrhosis, and recent R BKA on 07/06/21 who was admitted with sepsis.  Pt seen in ICU bed - hard to get full Hx. Per notes, brought to ED via EMS from [facility] yesterday with concern for pressure ulcer on back and lethargy. Family reports confused for the past few days. In the ED, noted to be quite hypotensive. No hypoxia nor fever. Labs with Na 136, K 4.5, Ca 8.8, Alb 1.6, ammonia 34, LA 3.2 -> 3.6, WBC 15.7, Hgb 12.2. Head CT negative. CXR 10/13 with small R effusion -> CXR with pulm edema s/p fluid resuscitation in ED. Flu/COVID negative. Blood Cx collected, started on Vanc/Cefepime. S/p diagnostic paracentesis -> high cell count c/w SBP.  Today - he is a little confused but oriented to person and place and recognizes me from dialysis. Denies CP or dyspnea. He is markedly edematous today. He denies abdominal pain.  Dialyzes on MWF schedule at [facility] - last HD 10/12 for which he completed a full treatment but with no UF as starting BP was 77/55.  Past Medical History:  Diagnosis Date   Allergy    Anemia    Arthritis    Bleeding ulcer    Cirrhosis ([place])    w/ esophageal varices   Complication of anesthesia    " I had a nose bleed a day or two after surgery-it could have been the oxygen"   Diabetes mellitus without complication (HCC)    type II   Diabetic neuropathy (HCC)    Duodenal ulcer    Elevated TSH    ESRD on dialysis ([place])    MWF   History of hiatal hernia    History of kidney stones    Hypertension    Portal hypertension (HCC)    Stress ulcer    after surgery- "only one time"  patient is on pantoprazole daily   Stroke [facility])    hemorrhagic 2010   Tobacco abuse    Past Surgical History:  Procedure Laterality Date    A/V FISTULAGRAM N/A 02/05/2018   Procedure: A/V FISTULAGRAM;  Surgeon: Conrad Waimea, MD;  Location: [place] [facility];  Service: Cardiovascular;  Laterality: N/A;   AMPUTATION Right 03/27/2020   Procedure: AMPUTATION RAY 5th;  Surgeon: Trula Slade, DPM;  Location: [place];  Service: Podiatry;  Laterality: Right;   AMPUTATION Right 07/06/2021   Procedure: RIGHT BELOW KNEE AMPUTATION;  Surgeon: Newt Minion, MD;  Location: [place];  Service: Orthopedics;  Laterality: Right;   APPLICATION OF WOUND VAC  07/06/2021   Procedure: APPLICATION OF WOUND VAC;  Surgeon: Newt Minion, MD;  Location: [place];  Service: Orthopedics;;   AV FISTULA PLACEMENT Left 05/16/2017   Procedure: ARTERIOVENOUS (AV) FISTULA CREATION-LEFT;  Surgeon: Angelia Mould, MD;  Location: [facility] OR;  Service: Vascular;  Laterality: Left;   CHOLECYSTECTOMY     COLONOSCOPY     ESOPHAGOGASTRODUODENOSCOPY N/A 01/23/2018   Procedure: ESOPHAGOGASTRODUODENOSCOPY (EGD);  Surgeon: Yetta Flock, MD;  Location: [facility] ENDOSCOPY;  Service: Gastroenterology;  Laterality: N/A;   LITHOTRIPSY     PANCREAS SURGERY     PERIPHERAL VASCULAR BALLOON ANGIOPLASTY  02/05/2018   Procedure: PERIPHERAL VASCULAR BALLOON ANGIOPLASTY;  Surgeon: Conrad Centertown, MD;  Location: [place] [facility];  Service: Cardiovascular;;  left AV fistula   SPLENECTOMY     WOUND DEBRIDEMENT Right 03/30/2020   Procedure: DEBRIDEMENT WOUND RIGHT FOOT;  Surgeon: Trula Slade, DPM;  Location: [place];  Service: Podiatry;  Laterality: Right;   Family History  Problem Relation Age of Onset   Lymphoma Mother    Hypertension Father    Diabetes Mellitus II Father    Hypertension Brother    Diabetes Mellitus I Brother    Hypertension Brother    Colon cancer Neg Hx    Stomach cancer Neg Hx    Colon polyps Neg Hx    Esophageal cancer Neg Hx    Rectal cancer Neg Hx    Social History:  reports that he has been smoking cigarettes. He has a 20.00 pack-year smoking  history. He has never used smokeless tobacco. He reports that he does not drink alcohol and does not use drugs.  ROS: As per HPI otherwise negative.  Physical Exam: Vitals:   07/20/21 0400 07/20/21 0500 07/20/21 0600 07/20/21 0712  BP: (!) 87/67 (!) 83/58 (!) 84/55   Pulse: 64 62 63   Resp: (!) 21 19 15    Temp:    97.7 F (36.5 C)  TempSrc:    Oral  SpO2: 96% 97% 99%   Weight:  83.9 kg    Height:         General: Frail man, NAD. Nasal O2 in place. Head: Normocephalic, atraumatic, sclera non-icteric, mucus membranes are moist. Neck: Supple without lymphadenopathy/masses. JVD not elevated. Lungs: Clear bilaterally to auscultation without wheezes, rales, or rhonchi. Breathing is unlabored. Heart: RRR with normal S1, S2. No murmurs, rubs, or gallops appreciated. Abdomen: soft, but very distended with subcutaneous edema present Lower extremities: R stump bandaged; 2+ BLE edema Neuro: Alert and oriented X 2. Moves all extremities spontaneously. Dialysis Access: LUE AVF + bruit  Allergies  Allergen Reactions   Penicillins Hives and Rash    Has patient had a PCN reaction causing immediate rash, facial/tongue/throat swelling, SOB or lightheadedness with hypotension: Yes Has patient had a PCN reaction causing severe rash involving mucus membranes or skin necrosis: No Has patient had a PCN reaction that required hospitalization: No Has patient had a PCN reaction occurring within the last 10 years: No If all of the above answers are "NO", then may proceed with Cephalosporin use. Ceftriaxone = no issues   Metoprolol Other (See Comments)    Cause inflamed stomach   Nsaids     Due to kidneys   Aspirin Nausea And Vomiting    Other Reaction: GI Upset Bleeding ulcer   Prior to Admission medications   Medication Sig Start Date End Date Taking? Authorizing Provider  alum & mag hydroxide-simeth (MYLANTA) 200-200-20 MG/5ML suspension Take 15 mLs by mouth in the morning and at bedtime.   Yes  [provider]  amLODipine (NORVASC) 10 MG tablet Take 1 tablet (10 mg total) by mouth daily. Patient taking differently: Take 10 mg by mouth at bedtime. 04/02/20  Yes Thurnell Lose, MD  insulin aspart protamine- aspart (NOVOLOG MIX 70/30) (70-30) 100 UNIT/ML injection Inject 0.05 mLs (5 Units total) into the skin at bedtime. Patient taking differently: Inject 5 Units into the skin at bedtime as needed (high blood sugar). Depending on blood sugar. 02/07/17  Yes Eugenie Filler, MD  loperamide (IMODIUM) 2 MG capsule Take 2 mg by mouth every 6 (six) hours as needed for diarrhea or loose stools.   Yes [provider]  nadolol (CORGARD)  20 MG tablet Take 20 mg by mouth at bedtime.   Yes [provider]  ondansetron (ZOFRAN) 4 MG tablet Take 4 mg by mouth every 8 (eight) hours as needed for nausea.   Yes [provider]  oxyCODONE-acetaminophen (PERCOCET/ROXICET) 5-325 MG tablet Take 1 tablet by mouth every 4 (four) hours as needed. Patient taking differently: Take 1 tablet by mouth every 4 (four) hours as needed for moderate pain. 07/10/21  Yes Newt Minion, MD  pantoprazole (PROTONIX) 20 MG tablet Take 1 tablet by mouth once daily 06/20/21  Yes Armbruster, Carlota Raspberry, MD  nadolol (CORGARD) 40 MG tablet Take 1 tablet (40 mg total) by mouth daily. Patient not taking: No sig reported 05/01/21   Yetta Flock, MD  sevelamer carbonate (RENVELA) 800 MG tablet Take 1,600 mg by mouth 3 (three) times daily. Patient not taking: Reported on 07/13/2021 07/03/21   [provider]   Current Facility-Administered Medications  Medication Dose Route Frequency Provider Last Rate Last Admin   0.9 %  sodium chloride infusion  250 mL Intravenous Continuous Desai, Rahul P, PA-C       ceFEPIme (MAXIPIME) 1 g in sodium chloride 0.9 % 100 mL IVPB  1 g Intravenous Q24H Noemi Chapel P, DO       Chlorhexidine Gluconate Cloth 2 % PADS 6 each  6 each Topical Daily Julian Hy, DO   6 each at 07/08/2021 2256   docusate sodium (COLACE) capsule 100 mg  100 mg Oral BID PRN Noemi Chapel P, DO       heparin injection 5,000 Units  5,000 Units Subcutaneous Q8H Julian Hy, DO   5,000 Units at 07/20/21 0551   insulin aspart (novoLOG) injection 0-6 Units  0-6 Units Subcutaneous Q4H Noemi Chapel P, DO       lactated ringers infusion   Intravenous Continuous Julian Hy, DO 50 mL/hr at 07/08/2021 2140 Restarted at 07/31/2021 2140   norepinephrine (LEVOPHED) 4mg  in 253mL premix infusion  2-10 mcg/min Intravenous Titrated Desai, Rahul P, PA-C       pantoprazole (PROTONIX) EC tablet 20 mg  20 mg Oral Q1200 Desai, Rahul P, PA-C       polyethylene glycol (MIRALAX / GLYCOLAX) packet 17 g  17 g Oral Daily PRN Noemi Chapel P, DO       sevelamer carbonate (RENVELA) tablet 1,600 mg  1,600 mg Oral TID WC Noemi Chapel P, DO       vancomycin variable dose per unstable renal function (pharmacist dosing)   Does not apply See admin instructions Julian Hy, DO       Labs: Basic Metabolic Panel: Recent Labs  Lab 07/22/2021 1648 07/20/21 0234  NA 136 134*  K 4.5 3.9  CL 94* 95*  CO2 27 29  GLUCOSE 53* 86  BUN 36* 34*  CREATININE 3.92* 3.74*  CALCIUM 8.8* 9.1  PHOS  --  4.2   Liver Function Tests: Recent Labs  Lab 08/05/2021 1648  AST 49*  ALT 18  ALKPHOS 68  BILITOT 6.7*  PROT 5.4*  ALBUMIN 1.6*    Recent Labs  Lab 08/03/2021 1650  AMMONIA 34   CBC: Recent Labs  Lab 07/15/2021 1648 07/20/21 0234  WBC 15.7* 17.5*  NEUTROABS 13.1*  --   HGB 12.2* 11.9*  HCT 37.2* 34.6*  MCV 100.0 96.6  PLT 240 239   Studies/Results: CT HEAD WO CONTRAST (5MM)  Result Date: 07/20/2021 CLINICAL DATA:  Mental status change EXAM:  CT HEAD WITHOUT CONTRAST TECHNIQUE: Contiguous axial images were obtained from the base of the skull through the vertex without intravenous contrast. COMPARISON:  11/07/2008 FINDINGS: Brain: No evidence of acute infarction, hemorrhage, hydrocephalus,  extra-axial collection or mass lesion/mass effect. Remote left posterior basal ganglia infarct, image 16/4. There is mild diffuse low-attenuation within the subcortical and periventricular white matter compatible with chronic microvascular disease. Vascular: No hyperdense vessel or unexpected calcification. Skull: Normal. Negative for fracture or focal lesion. Sinuses/Orbits: No acute finding. Other: None IMPRESSION: 1. No acute intracranial abnormalities. 2. Chronic small vessel ischemic change and brain atrophy. Electronically Signed   By: Kerby Moors M.D.   On: 07/20/2021 05:51   DG Chest Port 1 View  Result Date: 07/20/2021 CLINICAL DATA:  Respiratory failure. EXAM: PORTABLE CHEST 1 VIEW COMPARISON:  07/30/2021 FINDINGS: Lung volumes are low. Signs of a small right pleural effusion is identified. Interval development of bilateral interstitial and airspace opacities. Visualized osseous structures appear intact. IMPRESSION: 1. Interval development of bilateral interstitial and airspace opacities compatible with pulmonary edema versus multifocal pneumonia. 2. Small right pleural effusion. Electronically Signed   By: Kerby Moors M.D.   On: 07/20/2021 06:50   DG Chest Port 1 View  Result Date: 07/23/2021 CLINICAL DATA:  Shortness of breath and weakness. EXAM: PORTABLE CHEST 1 VIEW COMPARISON:  Feb 05, 2017 FINDINGS: Low lung volumes are noted. Very mild atelectasis is seen within the right lung base. There is a small right pleural effusion. Mild elevation of the right hemidiaphragm is seen. No pneumothorax is identified. The cardiac silhouette is enlarged and unchanged in size. Surgical clips are seen overlying the left upper quadrant. The visualized skeletal structures are unremarkable. IMPRESSION: Low lung volumes with mild right basilar atelectasis and small right pleural effusion. Electronically Signed   By: Virgina Norfolk M.D.   On: 07/13/2021 17:16    Dialysis Orders:  MWF at [facility] 4hr,  4090/800, EDW 78kg, 2K/2Ca, AVF, heparin 3000 - Hectoral 38mcg IV q HD - No ESA  Assessment/Plan:  Sepsis/SBP: S/p diagnostic paracentesis with ^ cell count, gram stain (-). Cx pending. Blood Cx pending. On Vanc/Cefepime.  ESRD:  Continue HD per usual MWF schedule - HD today if BP allows -> may roll over to tomorrow.  Hypertension/volume: BP previously controlled on 2 anti-HTN meds, significant hypotension ongoing for at least a few days. OP meds on hold. Has significant ascites, LE edema and subq abdominal edema. UF as tolerated with HD. Will use midodrine and/or IV albumin prn.  Anemia: Hgb ~12, no ESA needed.  Metabolic bone disease: CorrCa high, Phos ok - hold VDRA and continue home binders.  Nutrition: Alb very low - in setting of liver disease and weight loss. Will add supplements. Cirrhosis Hx CVA Recent R BKA - healing well per notes Sacral ulcer - per primary.  Veneta Penton, PA-C 07/20/2021, 8:49 AM  [name]

## 2021-07-20 NOTE — Progress Notes (Signed)
Hypoglycemic Event  CBG: 46  Treatment: D50 50 mL (25 gm)  Symptoms: None  Follow-up CBG: Time:1126 CBG Result:116  Possible Reasons for Event: Inadequate meal intake  Comments/MD notified: Dr. [name] notified, SSI discontinued, regular diet order placed.     Tippi Mccrae A Ardine Iacovelli

## 2021-07-20 NOTE — Progress Notes (Addendum)
NAME:  Brian [name], MRN:  [id number], DOB:  10-27-1960, LOS: 1 ADMISSION DATE:  07/30/2021, CONSULTATION DATE:  07/26/2021 REFERRING MD:  Tamsen Snider, CHIEF COMPLAINT:  Hypotension   Brief History   Mr. Pignato is a 60 y/o gentleman with a history of HTN, ESRD, DM, cirrhosis who presented with confusion and fatigue that has been noticeable since earlier today per his son. He did well in PT, but after was more confused. Per triage notes, he has had declining health status since his BKA 2 weeks ago. Prior to his surgery he lived at home. He has been going to his HD sessions as scheduled, most recently yesterday on 10/12. He endorses some SOB since being laid in trendelenburg, but denies other complaints. No abdominal pain. He had follow up with orthopedics earlier this week with no concern for infection. He has a decubitus ulcer on his buttocks.    In the ED he was hypotensive with Bps 70-80s/50s. He was given 500cc IVF without response. Blood cultures have been drawn and vanc, cefepime started. His sister and son are at bedside. He does not have completed POA paperwork but brought it at the time of surgery and it was never notarized.   Past Medical History  ESRD (MWF) Cirrhosis with portal gastritis, mild varices, last EGD 04/2021 R BKA for nonhealing fracture HTN T2DM Hemorrhagic stroke 2010 H/o splenectomy Tobacco abuse  Significant Hospital Events   10/13 Admitted  Consults:  Nephrology   Procedures:  10/13 Paracentesis (70cc amber fluid)  Significant Diagnostic Tests:  CT HEAD WO CONTRAST (5MM) Result Date: 07/20/2021 FINDINGS: Brain: No evidence of acute infarction, hemorrhage, hydrocephalus, extra-axial collection or mass lesion/mass effect. Remote left posterior basal ganglia infarct, image 16/4. There is mild diffuse low-attenuation within the subcortical and periventricular white matter compatible with chronic microvascular disease. Vascular: No hyperdense vessel or unexpected  calcification. Skull: Normal. Negative for fracture or focal lesion. Sinuses/Orbits: No acute finding. Other: None  IMPRESSION: 1. No acute intracranial abnormalities. 2. Chronic small vessel ischemic change and brain atrophy.   2. DG Chest Port 1 View Result Date: 07/18/2021 FINDINGS: Low lung volumes are noted. Very mild atelectasis is seen within the right lung base. There is a small right pleural effusion. Mild elevation of the right hemidiaphragm is seen. No pneumothorax is identified. The cardiac silhouette is enlarged and unchanged in size. Surgical clips are seen overlying the left upper quadrant. The visualized skeletal structures are unremarkable.  IMPRESSION: Low lung volumes with mild right basilar atelectasis and small right pleural effusion.     Micro Data:  10/13 COVID PCR - negative  10/13 MRSA PCR - negative  10/13 Bcx pending 10/13 Peritoneal culture pending  Antimicrobials:  Cefepime 10/13 > Vanc 10/13 >   Interim History/Subjective:  Patient resting in bed. He is more awake and alert but slowed speech and likely not at baseline cognition. He denies any significant symptoms today.   Objective:  Blood pressure 90/64, pulse 63, temperature 97.9 F (36.6 C), temperature source Axillary, resp. rate 18, height 5\' 9"  (1.753 m), weight 79 kg, SpO2 97 %.       No intake or output data in the 24 hours ending 07/20/21 0559 Filed Weights   07/18/2021 1614  Weight: 79 kg   NE: 0  Examination: General: chronically ill appearing and jaundice  HENT: scleral icterus, EOM intact and PERRLA Lungs: clear to ascultation bilaterally.  Cardiovascular: RRR, no obvious murmurs  Abdomen: distended, fluid wave present  Extremities: cool  and dry, BKA present on right and wrapped Neuro: tremor with possible asterixis, slow speech, but alert and oriented. 3 GU: n/a  Patient Lines/Drains/Airways Status     Active Line/Drains/Airways     Name Placement date Placement time Site Days    Peripheral IV 07/22/2021 20 G Right Antecubital 07/21/2021  1722  Antecubital  1   Peripheral IV 08/04/2021 22 G Posterior;Right Hand 07/20/2021  2003  Hand  1   Fistula / Graft Left Forearm Arteriovenous fistula 05/16/17  1542  Forearm  1526   Incision (Closed) 05/16/17 Arm Left 05/16/17  1525  -- 1526   Incision (Closed) 03/27/20 Foot Right 03/27/20  1256  -- 480   Incision (Closed) 03/30/20 Foot Right 03/30/20  1306  -- 477   Incision (Closed) 07/06/21 Knee Right 07/06/21  1321  -- 14   Pressure Injury 07/06/21 Medial Stage 2 -  Partial thickness loss of dermis presenting as a shallow open injury with a red, pink wound bed without slough. 07/06/21  1930  -- 14   Wound / Incision (Open or Dehisced) 03/26/20 Diabetic ulcer Foot Right 03/26/20  1800  Foot  481   Wound / Incision (Open or Dehisced) 03/26/20 Diabetic ulcer Other (Comment) Right plantar ulceration 03/26/20  1800  Other (Comment)  481        Resolved Hospital Problem list   N/a  Assessment & Plan:  SHEPHERD FINNAN is a 60 y.o. 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  Sepsis 2/2 SBP Lactic acidosis due to sepsis - improved  Patient remains hypotensives on pressors. He is afebrile with a leukocytosis of 17.5. He is s/p diagnostic paracentesis with 3,605 nucleated cells and 95% being PMNs making SBP his likely source. Cultures are pending. He is currently on day 2 of AB therapy with vanc and cefepime. His MRSA PCR was negative, with no previous history of MRSA infections. He does have diabetes but otherwise no other risk factors for pseudomonas.  - Vasopressors as required to maintain MAP >65 - S/p fluid resuscitation  -D#2 of cefepime and vanc, consider d/c vanc and  deescalate to ceftriaxone pending cultures. - Bcx and peritoneal cultures pending    Acute metabolic encephalopathy due to sepsis, possible component of hepatic encephalopathy. Improved today.  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 He is acutely altered 2/2 to sepssis with possible component of hepatic encephalopathy. No evidence of hematemesis at this time. His last EGE was in 04/2021.   - Con't PTA PPI - Consider therapeutic para prior to discharge.  - Consider starting lactulose as needed to BM/encephalopathy    Sacral decubitus ulcer, POA -wound care   Tobacco abuse -needs counseling on the importance of cessation  GoC - Previous [place] conversation performed yesterday - See below:  Patients sister indicated that she and her husband are his POAs. They had discussed this prior to his recent BKA, but the paperwork was not able to be notarized while he was in the hospital, so there is no official documentation at this time. He would want full aggressive care short term, but if it was deemed he would not recover he would not want life support indefinitely.  Best Practice:  Diet: NPO Pain/Anxiety/Delirium protocol (if indicated): n/a VAP protocol (if indicated): n/a DVT prophylaxis: SCD GI prophylaxis: PPI Glucose control: CBG monitoring /SSI prn Lines: N/A Foley: Foley:  N/A Mobility:  bed rest Code Status: full code Family Communication: yesterday Disposition: ICU  Labs   CBC: Recent Labs  Lab 07/23/2021 1648 07/20/21 0234  WBC 15.7* 17.5*  NEUTROABS 13.1*  --   HGB 12.2* 11.9*  HCT 37.2* 34.6*  MCV 100.0 96.6  PLT 240 169    Basic Metabolic Panel: Recent Labs  Lab 07/20/2021 1648 07/20/21 0234  NA 136 134*  K 4.5 3.9  CL 94* 95*  CO2 27 29  GLUCOSE 53* 86  BUN 36* 34*  CREATININE 3.92* 3.74*  CALCIUM 8.8* 9.1  MG  --  2.0  PHOS  --  4.2   GFR: Estimated Creatinine Clearance: 21.5 mL/min (A) (by C-G formula based on SCr of 3.74 mg/dL (H)). Recent Labs  Lab 07/26/2021 1648 07/11/2021 1649 07/18/2021 2000 07/20/21 0234  WBC 15.7*  --   --  17.5*  LATICACIDVEN  --  3.2* 3.6*  --     Liver Function Tests: Recent Labs  Lab 07/07/2021 1648  AST 49*  ALT 18  ALKPHOS 68  BILITOT 6.7*  PROT 5.4*  ALBUMIN 1.6*   No results for input(s): LIPASE, AMYLASE in the last 168 hours. Recent Labs  Lab 07/16/2021 1650  AMMONIA 34    ABG    Component Value Date/Time   TCO2 31 07/06/2021 0954     Coagulation Profile: Recent Labs  Lab 08/05/2021 1648  INR 1.5*    Cardiac Enzymes: No results for input(s): CKTOTAL, CKMB, CKMBINDEX, TROPONINI in the last 168 hours.  HbA1C: Hgb A1c MFr Bld  Date/Time Value Ref Range Status  07/07/2021 03:26 AM 4.7 (L) 4.8 - 5.6 % Final    Comment:    (NOTE) Pre diabetes:          5.7%-6.4%  Diabetes:              >6.4%  Glycemic control for   <7.0% adults with diabetes   03/26/2020 11:38 AM 6.5 (H) 4.8 - 5.6 % Final    Comment:    (NOTE) Pre diabetes:          5.7%-6.4%  Diabetes:              >6.4%  Glycemic control for   <7.0% adults with diabetes     CBG: Recent Labs  Lab 07/07/2021 2020 07/10/2021 2139 07/13/2021 2208 07/08/2021 2317 07/20/21 0317  GLUCAP 88 67* 104* 93 76    Review of Systems:   N/a  Past Medical History  He,  has a past medical history of Allergy, Anemia, Arthritis,  Bleeding ulcer, Cirrhosis ([place]), Complication of anesthesia, Diabetes mellitus without complication ([place]), Diabetic neuropathy ([place]), Duodenal ulcer, Elevated TSH, ESRD on dialysis ([place]), History of hiatal hernia, History of kidney stones, Hypertension, Portal hypertension ([place]), Stress ulcer, Stroke ([place]), and Tobacco abuse.   Surgical History  Past Surgical History:  Procedure Laterality Date   A/V FISTULAGRAM N/A 02/05/2018   Procedure: A/V FISTULAGRAM;  Surgeon: Conrad [place], MD;  Location: [facility];  Service: Cardiovascular;  Laterality: N/A;   AMPUTATION Right 03/27/2020   Procedure: AMPUTATION RAY 5th;  Surgeon: Trula Slade, DPM;  Location: [place];  Service: Podiatry;  Laterality: Right;   AMPUTATION Right 07/06/2021   Procedure: RIGHT BELOW KNEE AMPUTATION;  Surgeon: Newt Minion, MD;  Location: [place];  Service: Orthopedics;  Laterality: Right;   APPLICATION OF WOUND VAC  07/06/2021   Procedure: APPLICATION OF WOUND VAC;  Surgeon: Newt Minion, MD;  Location: [place];  Service: Orthopedics;;   AV FISTULA PLACEMENT Left 05/16/2017   Procedure: ARTERIOVENOUS (AV) FISTULA CREATION-LEFT;  Surgeon: Angelia Mould, MD;  [facility];  Service: Vascular;  Laterality: Left;   CHOLECYSTECTOMY     COLONOSCOPY     ESOPHAGOGASTRODUODENOSCOPY N/A 01/23/2018   Procedure: ESOPHAGOGASTRODUODENOSCOPY (EGD);  Surgeon: Yetta Flock, MD;  Location: [facility];  Service: Gastroenterology;  Laterality: N/A;   LITHOTRIPSY     PANCREAS SURGERY     PERIPHERAL VASCULAR BALLOON ANGIOPLASTY  02/05/2018   Procedure: PERIPHERAL VASCULAR BALLOON ANGIOPLASTY;  Surgeon: Conrad [place], MD;  Location: [place] [facility];  Service: Cardiovascular;;  left AV fistula   SPLENECTOMY     WOUND DEBRIDEMENT Right 03/30/2020   Procedure: DEBRIDEMENT WOUND RIGHT FOOT;  Surgeon: Trula Slade, DPM;  Location: [place];  Service: Podiatry;  Laterality: Right;     Social History    reports that he has been smoking cigarettes. He has a 20.00 pack-year smoking history. He has never used smokeless tobacco. He reports that he does not drink alcohol and does not use drugs.   Family History   His family history includes Diabetes Mellitus I in his brother; Diabetes Mellitus II in his father; Hypertension in his brother, brother, and father; Lymphoma in his mother. There is no history of Colon cancer, Stomach cancer, Colon polyps, Esophageal cancer, or Rectal cancer.   Allergies Allergies  Allergen Reactions   Penicillins Hives and Rash    Has patient had a PCN reaction causing immediate rash, facial/tongue/throat swelling, SOB or lightheadedness with hypotension: Yes Has patient had a PCN reaction causing severe rash involving mucus membranes or skin necrosis: No Has patient had a PCN reaction that required hospitalization: No Has patient had a PCN reaction occurring within the last 10 years: No If all of the above answers are "NO", then may proceed with Cephalosporin use. Ceftriaxone = no issues   Metoprolol Other (See Comments)    Cause inflamed stomach   Nsaids     Due to kidneys   Aspirin Nausea And Vomiting    Other Reaction: GI Upset Bleeding ulcer     Home Medications  Prior to Admission medications   Medication Sig Start Date End Date Taking? Authorizing Provider  alum & mag hydroxide-simeth (MYLANTA) 200-200-20 MG/5ML suspension Take 15 mLs by mouth in the morning and at bedtime.   Yes [provider]  amLODipine (NORVASC) 10 MG tablet Take 1 tablet (10 mg total) by mouth daily. Patient taking differently: Take 10 mg by mouth at bedtime. 04/02/20  Yes Thurnell Lose, MD  insulin aspart protamine- aspart (NOVOLOG MIX 70/30) (70-30) 100 UNIT/ML injection Inject 0.05 mLs (5 Units total) into the skin at bedtime. Patient taking differently: Inject 5 Units into the skin at bedtime as needed (high blood  sugar). Depending on blood sugar. 02/07/17  Yes  Eugenie Filler, MD  loperamide (IMODIUM) 2 MG capsule Take 2 mg by mouth every 6 (six) hours as needed for diarrhea or loose stools.   Yes [provider]  nadolol (CORGARD) 20 MG tablet Take 20 mg by mouth at bedtime.   Yes [provider]  ondansetron (ZOFRAN) 4 MG tablet Take 4 mg by mouth every 8 (eight) hours as needed for nausea.   Yes [provider]  oxyCODONE-acetaminophen (PERCOCET/ROXICET) 5-325 MG tablet Take 1 tablet by mouth every 4 (four) hours as needed. Patient taking differently: Take 1 tablet by mouth every 4 (four) hours as needed for moderate pain. 07/10/21  Yes Newt Minion, MD  pantoprazole (PROTONIX) 20 MG tablet Take 1 tablet by mouth once daily 06/20/21  Yes Armbruster, Carlota Raspberry, MD  nadolol (CORGARD) 40 MG tablet Take 1 tablet (40 mg total) by mouth daily. Patient not taking: No sig reported 05/01/21   Yetta Flock, MD  sevelamer carbonate (RENVELA) 800 MG tablet Take 1,600 mg by mouth 3 (three) times daily. Patient not taking: Reported on 07/08/2021 07/03/21   [provider]     Critical care time: [place], D.O.  Internal Medicine Resident, PGY-3 [facility]  Pager: [phone] 5:59 AM, 07/20/2021

## 2021-07-20 NOTE — Progress Notes (Addendum)
Brief Nutrition Note  Consult received for enteral nutrition initiation and management. Pt with order for Cortrak placed by MD at 1503 today. Have discussed this with Cortrak team who may be unable to place Cortrak today due to high volume of Cortrak tube orders on this date.  Discussed with RN who will reach out to family regarding placement of small-bore NG tube at bedside if Cortrak unable to be placed. RD to order tube feeds as written in Initial Nutrition Assessment from earlier today.  Once Cortrak vs small-bore NG tube is placed and placement confirmed, initiate enteral nutrition: - Start Vital 1.5 @ 20 ml/hr and advance by 10 ml q 6 hours to goal rate of 60 ml/hr (1440 ml/day) - ProSource TF 45 ml BID   Tube feeding regimen at goal provides 2240 kcal, 119 grams of protein, and 1100 ml of H2O.  RD will continue to follow pt during admission.  ADDENDUM: RD was informed by Pharmacy that we are out of stock of Vital 1.5. Will order the following: - Start Osmolite 1.5 @ 20 ml/hr and advance by 10 ml q 6 hours to goal rate of 60 ml/hr (1440 ml/day) - ProSource TF 45 ml BID   Tube feeding regimen at goal provides 2240 kcal, 112 grams of protein, and 1097 ml of H2O.   Gustavus Bryant, MS, RD, LDN Inpatient Clinical Dietitian Please see AMiON for contact information.

## 2021-07-20 NOTE — Progress Notes (Signed)
Initial Nutrition Assessment  DOCUMENTATION CODES:   Severe malnutrition in context of chronic illness  INTERVENTION:   - Check vitamin C level and supplement if deficient  Given severity of malnutrition, history of inadequate PO intake, and wounds, recommend Cortrak NG tube placement and initiation of enteral nutrition. Recommend: - Start Vital 1.5 @ 20 ml/hr and advance by 10 ml q 6 hours to goal rate of 60 ml/hr (1440 ml/day) - ProSource TF 45 ml BID  Tube feeding regimen at goal would provide 2240 kcal, 119 grams of protein, and 1100 ml of H2O.   - Nepro Shake po TID, each supplement provides 425 kcal and 19 grams protein  - Renal MVI daily  - d/c ProSource Plus due to pt preference  NUTRITION DIAGNOSIS:   Severe Malnutrition related to chronic illness (ESRD, cirrhosis) as evidenced by severe fat depletion, severe muscle depletion.  GOAL:   Patient will meet greater than or equal to 90% of their needs  MONITOR:   PO intake, Supplement acceptance, Labs, Weight trends, Skin, I & O's  REASON FOR ASSESSMENT:   Rounds    ASSESSMENT:   60 year old male who presented to the ED on 10/13 with weakness. PMH of cirrhosis, ESRD on HD, T2DM, hemorrhagic stroke in 2010, recent R BKA, HTN, tobacco abuse. Pt admitted with sepsis, acute metabolic encephalopathy.  10/13 - s/p paracentesis with 70 ml fluid removed  Discussed pt with RN and during ICU rounds. Pt found to have septic shock from SBP as well as cirrhosis with tense ascites. Per CCM, pt will required a large volume paracentesis with albumin replacement soon. Paracentesis was postponed so regular diet was ordered per discussion with MD. Per chart, pt consumed 25% of lunch meal today.  Spoke with pt's step-son and brother-in-law at bedside. Pt very sleepy at time of RD visit and would only briefly open his eyes and interact with RD. Majority of history obtained from brother-in-law who reports pt with poor PO intake since R  BKA on 07/06/21. After this admission, pt was d/c to SNF for rehab and had been eating poorly while there. Family reports pt eating a few bites at each meal. They do not believe that he was consuming oral nutrition supplements.  Pt's family reports noticing that pt has lost fat and muscle stores. They can tell that he has lost dry weight just by looking at him. Pt's EDW is 78 kg per Nephrology notes. Current weight of 83.9 kg is well above EDW. Suspect this is due to large volume of ascites. It is unclear what pt's true dry weight would be without ascites and edema.  Per review of weight history in chart, pt has not lost weight. Pt's weight over the last year has fluctuated between 81-84 kg. However, suspect pt has had true dry weight loss given degree of malnutrition noted on NFPE.  Agree with Regular diet order. Discussed oral nutrition supplements with pt's family who were in agreement to try these. Discussed recommendation for Cortrak placement and initiation of enteral nutrition with CCM MD who is going to speak with family further. RD to order oral nutrition supplements.  Medications reviewed and include: ProSource Plus BID, SSI q 4 hours, protonix, renvela TID, IV abx  Labs reviewed: sodium 134, lactic acid 3.6, WBC 17.5 CBG's: 46-116 x 24 hours  I/O's: +617 ml since admit  NUTRITION - FOCUSED PHYSICAL EXAM:  Flowsheet Row Most Recent Value  Orbital Region Severe depletion  Upper Arm Region Severe depletion  Thoracic  and Lumbar Region Moderate depletion  [ascites]  Buccal Region Moderate depletion  Temple Region Severe depletion  Clavicle Bone Region Severe depletion  Clavicle and Acromion Bone Region Severe depletion  Scapular Bone Region Severe depletion  Dorsal Hand Moderate depletion  Patellar Region Moderate depletion  Anterior Thigh Region Severe depletion  Posterior Calf Region Moderate depletion  [edema]  Edema (RD Assessment) Moderate  [BLE]  Hair Reviewed  Eyes  Reviewed  Mouth Reviewed  Skin Reviewed  Nails Reviewed       Diet Order:   Diet Order             Diet regular Room service appropriate? Yes; Fluid consistency: Thin  Diet effective now                   EDUCATION NEEDS:   Not appropriate for education at this time  Skin:  Skin Assessment: Skin Integrity Issues: Stage II: sacrum Incisions: s/p R BKA  Last BM:  no documented BM  Height:   Ht Readings from Last 1 Encounters:  07/21/2021 5\' 9"  (1.753 m)    Weight:   Wt Readings from Last 1 Encounters:  07/20/21 83.9 kg    BMI:  Body mass index is 27.31 kg/m.  Estimated Nutritional Needs:   Kcal:  2100-2300  Protein:  110-130 grams  Fluid:  1000 ml + UOP    Brian Bryant, MS, RD, LDN Inpatient Clinical Dietitian Please see AMiON for contact information.

## 2021-07-20 NOTE — Progress Notes (Addendum)
Pt pulling leads off, pulling at cordtrac. Reminded pt to leave items alone.  Pt pulled cordtrac out and attempting to pull bridle out of nose.  Pt voices he wants to sit up.  Pt is slow to respond to questions but able to answer.  Pt states that he is sorry for pulling out the tube voices that he thinks that he must have still been sleeping and just cant think clearly.

## 2021-07-21 DIAGNOSIS — E43 Unspecified severe protein-calorie malnutrition: Secondary | ICD-10-CM | POA: Diagnosis not present

## 2021-07-21 DIAGNOSIS — N186 End stage renal disease: Secondary | ICD-10-CM | POA: Diagnosis not present

## 2021-07-21 DIAGNOSIS — R6521 Severe sepsis with septic shock: Secondary | ICD-10-CM | POA: Diagnosis not present

## 2021-07-21 DIAGNOSIS — Z992 Dependence on renal dialysis: Secondary | ICD-10-CM

## 2021-07-21 DIAGNOSIS — A419 Sepsis, unspecified organism: Secondary | ICD-10-CM | POA: Diagnosis not present

## 2021-07-21 LAB — GLUCOSE, CAPILLARY
Glucose-Capillary: 102 mg/dL — ABNORMAL HIGH (ref 70–99)
Glucose-Capillary: 71 mg/dL (ref 70–99)
Glucose-Capillary: 75 mg/dL (ref 70–99)
Glucose-Capillary: 90 mg/dL (ref 70–99)
Glucose-Capillary: 91 mg/dL (ref 70–99)
Glucose-Capillary: 92 mg/dL (ref 70–99)
Glucose-Capillary: 96 mg/dL (ref 70–99)

## 2021-07-21 LAB — CBC
HCT: 35.4 % — ABNORMAL LOW (ref 39.0–52.0)
Hemoglobin: 11.9 g/dL — ABNORMAL LOW (ref 13.0–17.0)
MCH: 33.2 pg (ref 26.0–34.0)
MCHC: 33.6 g/dL (ref 30.0–36.0)
MCV: 98.9 fL (ref 80.0–100.0)
Platelets: 193 10*3/uL (ref 150–400)
RBC: 3.58 MIL/uL — ABNORMAL LOW (ref 4.22–5.81)
RDW: 20 % — ABNORMAL HIGH (ref 11.5–15.5)
WBC: 17.7 10*3/uL — ABNORMAL HIGH (ref 4.0–10.5)
nRBC: 0.3 % — ABNORMAL HIGH (ref 0.0–0.2)

## 2021-07-21 LAB — COMPREHENSIVE METABOLIC PANEL
ALT: 17 U/L (ref 0–44)
AST: 35 U/L (ref 15–41)
Albumin: 1.5 g/dL — ABNORMAL LOW (ref 3.5–5.0)
Alkaline Phosphatase: 60 U/L (ref 38–126)
Anion gap: 14 (ref 5–15)
BUN: 46 mg/dL — ABNORMAL HIGH (ref 6–20)
CO2: 25 mmol/L (ref 22–32)
Calcium: 9 mg/dL (ref 8.9–10.3)
Chloride: 92 mmol/L — ABNORMAL LOW (ref 98–111)
Creatinine, Ser: 4.54 mg/dL — ABNORMAL HIGH (ref 0.61–1.24)
GFR, Estimated: 14 mL/min — ABNORMAL LOW (ref >60)
Glucose, Bld: 106 mg/dL — ABNORMAL HIGH (ref 70–99)
Potassium: 4.1 mmol/L (ref 3.5–5.1)
Sodium: 131 mmol/L — ABNORMAL LOW (ref 135–145)
Total Bilirubin: 8.2 mg/dL — ABNORMAL HIGH (ref 0.3–1.2)
Total Protein: 5.4 g/dL — ABNORMAL LOW (ref 6.5–8.1)

## 2021-07-21 LAB — PHOSPHORUS: Phosphorus: 4.7 mg/dL — ABNORMAL HIGH (ref 2.5–4.6)

## 2021-07-21 LAB — PROTIME-INR
INR: 1.5 — ABNORMAL HIGH (ref 0.8–1.2)
Prothrombin Time: 17.9 seconds — ABNORMAL HIGH (ref 11.4–15.2)

## 2021-07-21 LAB — MAGNESIUM: Magnesium: 2 mg/dL (ref 1.7–2.4)

## 2021-07-21 MED ORDER — SEVELAMER CARBONATE 800 MG PO TABS
1600.0000 mg | ORAL_TABLET | Freq: Three times a day (TID) | ORAL | Status: DC
  Administered 2021-07-21: 1600 mg via ORAL
  Filled 2021-07-21: qty 2

## 2021-07-21 MED ORDER — MIDODRINE HCL 5 MG PO TABS
10.0000 mg | ORAL_TABLET | Freq: Three times a day (TID) | ORAL | Status: DC
  Administered 2021-07-21 – 2021-07-22 (×4): 10 mg via ORAL
  Filled 2021-07-21 (×4): qty 2

## 2021-07-21 MED ORDER — DOCUSATE SODIUM 50 MG/5ML PO LIQD: 100.0000 mg | Freq: Two times a day (BID) | ORAL | Status: DC | PRN

## 2021-07-21 MED ORDER — PROSOURCE PLUS PO LIQD
30.0000 mL | Freq: Two times a day (BID) | ORAL | Status: DC
  Filled 2021-07-21 (×4): qty 30

## 2021-07-21 MED ORDER — ALBUMIN HUMAN 25 % IV SOLN
25.0000 g | Freq: Once | INTRAVENOUS | Status: AC
  Administered 2021-07-21: 25 g via INTRAVENOUS
  Filled 2021-07-21: qty 100

## 2021-07-21 MED ORDER — POLYETHYLENE GLYCOL 3350 17 G PO PACK: 17.0000 g | PACK | Freq: Every day | ORAL | Status: DC | PRN

## 2021-07-21 MED ORDER — RENA-VITE PO TABS
1.0000 | ORAL_TABLET | Freq: Every day | ORAL | Status: DC
  Administered 2021-07-21: 1 via ORAL
  Filled 2021-07-21: qty 1

## 2021-07-21 MED ORDER — MIDODRINE HCL 5 MG PO TABS: 10.0000 mg | ORAL_TABLET | Freq: Three times a day (TID) | ORAL | Status: DC

## 2021-07-21 MED ORDER — PANTOPRAZOLE SODIUM 40 MG PO TBEC
40.0000 mg | DELAYED_RELEASE_TABLET | Freq: Every day | ORAL | Status: DC
  Administered 2021-07-21: 40 mg via ORAL
  Filled 2021-07-21: qty 1

## 2021-07-21 NOTE — Progress Notes (Signed)
[facility] Progress Note   60 y.o. male with ESRD, T2DM, HTN, Hx CVA, cirrhosis, and recent R BKA on 07/06/21 who was admitted with sepsis. Brought to ED via EMS from [place] yesterday with concern for pressure ulcer on back and lethargy. Family reports confused for the past few days. In the ED, noted to be quite hypotensive. WBC 15.7, Hgb 12.2. Head CT negative. CXR 10/13 with small R effusion -> CXR with pulm edema s/p fluid resuscitation in ED. Flu/COVID negative. Blood Cx collected, started on Vanc/Cefepime. S/p diagnostic paracentesis -> high cell count c/w SBP.  Dialyzes on MWF schedule at [facility] - last HD 10/12 for which he completed a full treatment but with no UF as starting BP was 77/55.  Dialysis Orders:  MWF at [facility] 4hr, 4090/800, EDW 78kg, 2K/2Ca, AVF, heparin 3000 - Hectoral 58mcg IV q HD - No ESA  Assessment/ Plan:      Sepsis/SBP: S/p diagnostic paracentesis with ^ cell count, gram stain (-). Cx pending. Blood Cx pending. On Vanc/Cefepime.  ESRD:  Continue HD per usual MWF schedule - HD today as could not dialyze last night; I confirmed that he has on the schedule and notified the son as well.  Hypertension/volume: BP previously controlled on 2 anti-HTN meds, significant hypotension ongoing for at least a few days. OP meds on hold. Has significant ascites, LE edema and subq abdominal edema. UF as tolerated with HD. Will use midodrine and/or IV albumin prn.  Anemia: Hgb ~12, no ESA needed.  Metabolic bone disease: CorrCa high, Phos ok - hold VDRA and continue home binders.  Nutrition: Alb very low - in setting of liver disease and weight loss. Will add supplements. Cirrhosis - rapid accumulation; appears to have significant ascites again. Hx CVA Recent R BKA - healing well per notes Sacral ulcer - per primary.  Subjective:   Son bedside, has appetite. Denies f/c/n/v/sob   Objective:   BP (!) 89/62 (BP Location: Right Arm)   Pulse 64   Temp 97.6 F  (36.4 C) (Oral)   Resp 15   Ht 5\' 9"  (1.753 m)   Wt 83.9 kg   SpO2 96%   BMI 27.31 kg/m   Intake/Output Summary (Last 24 hours) at 07/21/2021 6222 Last data filed at 07/21/2021 0800 Gross per 24 hour  Intake 904.27 ml  Output --  Net 904.27 ml   Weight change:   Physical Exam: General: Frail man, NAD. Nasal O2 in place. Head: Normocephalic, atraumatic, sclera non-icteric, mucus membranes are moist. Neck: JVD not elevated. Lungs: Clear bilaterally to auscultation without wheezes, rales, or rhonchi.  Heart: RRR with normal S1, S2.  Abdomen: soft, but very distended with subcutaneous edema present Lower extremities: R stump bandaged; 2+ BLE edema Neuro: Alert and oriented X 2. Moves all extremities spontaneously. Dialysis Access: Lt Cimino AVF + bruit  Imaging: CT HEAD WO CONTRAST (5MM)  Result Date: 07/20/2021 CLINICAL DATA:  Mental status change EXAM: CT HEAD WITHOUT CONTRAST TECHNIQUE: Contiguous axial images were obtained from the base of the skull through the vertex without intravenous contrast. COMPARISON:  11/07/2008 FINDINGS: Brain: No evidence of acute infarction, hemorrhage, hydrocephalus, extra-axial collection or mass lesion/mass effect. Remote left posterior basal ganglia infarct, image 16/4. There is mild diffuse low-attenuation within the subcortical and periventricular white matter compatible with chronic microvascular disease. Vascular: No hyperdense vessel or unexpected calcification. Skull: Normal. Negative for fracture or focal lesion. Sinuses/Orbits: No acute finding. Other: None IMPRESSION: 1. No acute intracranial abnormalities.  2. Chronic small vessel ischemic change and brain atrophy. Electronically Signed   By: Kerby Moors M.D.   On: 07/20/2021 05:51   DG Chest Port 1 View  Result Date: 07/20/2021 CLINICAL DATA:  Respiratory failure. EXAM: PORTABLE CHEST 1 VIEW COMPARISON:  07/17/2021 FINDINGS: Lung volumes are low. Signs of a small right pleural  effusion is identified. Interval development of bilateral interstitial and airspace opacities. Visualized osseous structures appear intact. IMPRESSION: 1. Interval development of bilateral interstitial and airspace opacities compatible with pulmonary edema versus multifocal pneumonia. 2. Small right pleural effusion. Electronically Signed   By: Kerby Moors M.D.   On: 07/20/2021 06:50   DG Chest Port 1 View  Result Date: 07/25/2021 CLINICAL DATA:  Shortness of breath and weakness. EXAM: PORTABLE CHEST 1 VIEW COMPARISON:  Feb 05, 2017 FINDINGS: Low lung volumes are noted. Very mild atelectasis is seen within the right lung base. There is a small right pleural effusion. Mild elevation of the right hemidiaphragm is seen. No pneumothorax is identified. The cardiac silhouette is enlarged and unchanged in size. Surgical clips are seen overlying the left upper quadrant. The visualized skeletal structures are unremarkable. IMPRESSION: Low lung volumes with mild right basilar atelectasis and small right pleural effusion. Electronically Signed   By: Virgina Norfolk M.D.   On: 07/28/2021 17:16   DG Abd Portable 1V  Result Date: 07/20/2021 CLINICAL DATA:  Feeding tube placement. EXAM: PORTABLE ABDOMEN - 1 VIEW COMPARISON:  None. FINDINGS: Weighted tip enteric feeding catheter with tip overlying the distal stomach. The bowel gas pattern is normal. Surgical clips overlie the abdomen. Upper abdominal peripherally calcified lesions consistent with calcified chronic pancreatic pseudocyst seen on prior CT August 18, 2018. Small right pleural effusion with bibasilar interstitial and airspace opacities. IMPRESSION: 1. Enteric feeding catheter weighted tip overlies the distal stomach. Electronically Signed   By: Dahlia Bailiff M.D.   On: 07/20/2021 17:23    Labs: BMET Recent Labs  Lab 08/02/2021 1648 07/20/21 0234 07/20/21 1657 07/21/21 0605  NA 136 134*  --  131*  K 4.5 3.9  --  4.1  CL 94* 95*  --  92*  CO2  27 29  --  25  GLUCOSE 53* 86  --  106*  BUN 36* 34*  --  46*  CREATININE 3.92* 3.74*  --  4.54*  CALCIUM 8.8* 9.1  --  9.0  PHOS  --  4.2 4.5 4.7*   CBC Recent Labs  Lab 07/26/2021 1648 07/20/21 0234 07/21/21 0605  WBC 15.7* 17.5* 17.7*  NEUTROABS 13.1*  --   --   HGB 12.2* 11.9* 11.9*  HCT 37.2* 34.6* 35.4*  MCV 100.0 96.6 98.9  PLT 240 239 193    Medications:     (feeding supplement) PROSource Plus  30 mL Oral BID BM   Chlorhexidine Gluconate Cloth  6 each Topical Daily   feeding supplement (NEPRO CARB STEADY)  237 mL Oral TID BM   heparin  5,000 Units Subcutaneous Q8H   midodrine  10 mg Oral Q8H   multivitamin  1 tablet Oral QHS   pantoprazole  40 mg Oral QHS   sevelamer carbonate  1,600 mg Oral TID WC      Otelia Santee, MD 07/21/2021, 9:18 AM

## 2021-07-21 NOTE — Progress Notes (Signed)
NAME:  Brian [name], MRN:  [id number], DOB:  1961/06/20, LOS: 2 ADMISSION DATE:  07/14/2021, CONSULTATION DATE:  07/07/2021 REFERRING MD:  Tamsen Snider, CHIEF COMPLAINT:  Hypotension   Brief History   Brian [name] is a 60 y/o gentleman with a history of HTN, ESRD, DM, cirrhosis who presented with confusion and fatigue that has been noticeable since earlier today per his son. He did well in PT, but after was more confused. Per triage notes, he has had declining health status since his BKA 2 weeks ago. Prior to his surgery he lived at home. He has been going to his HD sessions as scheduled, most recently yesterday on 10/12. He endorses some SOB since being laid in trendelenburg, but denies other complaints. No abdominal pain. He had follow up with orthopedics earlier this week with no concern for infection. He has a decubitus ulcer on his buttocks.    In the ED he was hypotensive with Bps 70-80s/50s. He was given 500cc IVF without response. Blood cultures have been drawn and vanc, cefepime started. His sister and son are at bedside. He does not have completed POA paperwork but brought it at the time of surgery and it was never notarized.   Significant Hospital Events   10/13 Admitted  Consults:  Nephrology   Procedures:  10/13 Paracentesis (70cc amber fluid)   Interim History/Subjective:  No overnight event, Levophed was titrated off Remained afebrile  Objective:  Blood pressure (!) 84/59, pulse 68, temperature 98.2 F (36.8 C), temperature source Oral, resp. rate 15, height 5\' 9"  (1.753 m), weight 83.9 kg, SpO2 99 %.        Intake/Output Summary (Last 24 hours) at 07/21/2021 0635 Last data filed at 07/21/2021 0100 Gross per 24 hour  Intake 935.04 ml  Output --  Net 935.04 ml   Filed Weights   07/15/2021 1614 07/20/21 0500  Weight: 79 kg 83.9 kg   Examination: General: Acute on chronically ill appearing middle-aged male, looks older than his age, lying on the bed HENT: Atraumatic,  normocephalic scleral icterus, EOM intact and PERRLA Lungs: clear to ascultation bilaterally.  Cardiovascular: RRR, no obvious murmurs  Abdomen: distended, fluid wave present, positive bowel sounds Extremities: cool and dry, BKA present on right and wrapped Neuro: tremor with possible asterixis, slow speech, but alert and oriented. 3 Skin: No rash  Patient Lines/Drains/Airways Status     Active Line/Drains/Airways     Name Placement date Placement time Site Days   Peripheral IV 07/07/2021 20 G Right Antecubital 07/17/2021  1722  Antecubital  1   Peripheral IV 07/13/2021 22 G Posterior;Right Hand 07/29/2021  2003  Hand  1   Fistula / Graft Left Forearm Arteriovenous fistula 05/16/17  1542  Forearm  1526   Incision (Closed) 05/16/17 Arm Left 05/16/17  1525  -- 1526   Incision (Closed) 03/27/20 Foot Right 03/27/20  1256  -- 480   Incision (Closed) 03/30/20 Foot Right 03/30/20  1306  -- 477   Incision (Closed) 07/06/21 Knee Right 07/06/21  1321  -- 14   Pressure Injury 07/06/21 Medial Stage 2 -  Partial thickness loss of dermis presenting as a shallow open injury with a red, pink wound bed without slough. 07/06/21  1930  -- 14   Wound / Incision (Open or Dehisced) 03/26/20 Diabetic ulcer Foot Right 03/26/20  1800  Foot  481   Wound / Incision (Open or Dehisced) 03/26/20 Diabetic ulcer Other (Comment) Right plantar ulceration 03/26/20  1800  Other (Comment)  [facility] Problem list   N/a  Assessment & Plan:  Brian [name] is a 60 y.o. 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  Septic shock due to spontaneous bacterial peritonitis Levophed was titrated off White count remains elevated He is s/p diagnostic  paracentesis with 3,605 nucleated cells and 95% being PMNs making SBP his likely source. Cultures have been negative so far  Continue IV cefepime  Vancomycin was stopped  Trend lactate Continue midodrine   Acute metabolic encephalopathy due to sepsis, possible component of hepatic encephalopathy. 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) Tense ascites (SAAG < 1.1) Chronic portal gastropathy, 1 small esophageal varix Mild coagulopathy Hyperbilirubinemia; acutely likely decompensated due to sepsis Patient will need large-volume therapeutic paracentesis with albumin infusion His blood pressure is borderline Is due to receive dialysis Will try to do paracentesis either later today or tomorrow, even if he requires pressor support   Unstageable sacral decubitus ulcer, POA Continue wound care Change position every 2 hours   Hypervolemic hyponatremia In the setting of liver cirrhosis Closely monitor electrolytes  Tobacco dependence Counseling provided regarding quit smoking Watch for signs of withdrawal  Best Practice:  Diet: Dysphagia diet DVT prophylaxis: SCD GI prophylaxis: PPI Glucose control: CBG monitoring /SSI prn Lines: N/A Foley: Foley:  N/A Mobility: bed rest Code Status: DNR Family Communication: 10/14: Patient family decided to keep him DNR, continue current medical care Disposition: ICU  Labs   CBC: Recent Labs  Lab 07/23/2021 1648 07/20/21 0234  WBC 15.7* 17.5*  NEUTROABS 13.1*  --   HGB 12.2* 11.9*  HCT 37.2* 34.6*  MCV 100.0 96.6  PLT 240 539    Basic Metabolic Panel: Recent Labs  Lab 07/08/2021 1648 07/20/21 0234 07/20/21 1657  NA 136 134*  --   K 4.5 3.9   --   CL 94* 95*  --   CO2 27 29  --   GLUCOSE 53* 86  --   BUN 36* 34*  --   CREATININE 3.92* 3.74*  --   CALCIUM 8.8* 9.1  --   MG  --  2.0 2.0  PHOS  --  4.2 4.5   GFR: Estimated Creatinine Clearance: 21.5 mL/min (A) (by C-G formula based on SCr of 3.74 mg/dL (H)). Recent Labs  Lab 07/17/2021 1648 07/08/2021 1649 07/09/2021 2000 07/20/21 0234  WBC 15.7*  --   --  17.5*  LATICACIDVEN  --  3.2* 3.6*  --     Liver Function Tests: Recent Labs  Lab 08/02/2021 1648  AST 49*  ALT 18  ALKPHOS 68  BILITOT 6.7*  PROT 5.4*  ALBUMIN 1.6*   No results for input(s): LIPASE, AMYLASE in the last 168 hours. Recent Labs  Lab 07/30/2021 1650  AMMONIA 34    ABG    Component Value Date/Time   TCO2 31 07/06/2021 0954     Coagulation Profile: Recent Labs  Lab 07/23/2021 1648  INR 1.5*    Cardiac Enzymes: No results for input(s): CKTOTAL, CKMB, CKMBINDEX, TROPONINI in the last 168 hours.  HbA1C: Hgb A1c MFr Bld  Date/Time Value Ref Range Status  07/07/2021 03:26 AM 4.7 (L) 4.8 - 5.6 % Final    Comment:    (NOTE) Pre diabetes:          5.7%-6.4%  Diabetes:              >6.4%  Glycemic control for   <7.0% adults with diabetes   03/26/2020 11:38 AM 6.5 (H) 4.8 - 5.6 % Final    Comment:    (NOTE) Pre diabetes:          5.7%-6.4%  Diabetes:              >6.4%  Glycemic control for   <7.0% adults with diabetes     CBG: Recent Labs  Lab 07/20/21 2029 07/20/21 2031 07/20/21 2055 07/21/21 0008 07/21/21 0456  GLUCAP 66* 63* 117* 92 102*    Total critical care time: 41 minutes  Performed by: Gantt care time was exclusive of separately billable procedures and treating other patients.   Critical care was necessary to treat or prevent imminent or life-threatening deterioration.   Critical care was time spent personally by me on the following activities: development of treatment plan with patient and/or surrogate as well as nursing, discussions  with consultants, evaluation of patient's response to treatment, examination of patient, obtaining history from patient or surrogate, ordering and performing treatments and interventions, ordering and review of laboratory studies, ordering and review of radiographic studies, pulse oximetry and re-evaluation of patient's condition.   Jacky Kindle MD [facility] See Amion for pager If no response to pager, please call [phone] until 7pm After 7pm, Please call E-link [phone]

## 2021-07-21 NOTE — Progress Notes (Deleted)
Pt arrived from ED awake and following commands, nodding and gesturing appropriately. Dr. Carlis Abbott notified, TTM orders to be d/c.

## 2021-07-22 DIAGNOSIS — K652 Spontaneous bacterial peritonitis: Secondary | ICD-10-CM | POA: Diagnosis not present

## 2021-07-22 DIAGNOSIS — D649 Anemia, unspecified: Secondary | ICD-10-CM

## 2021-07-22 DIAGNOSIS — N186 End stage renal disease: Secondary | ICD-10-CM | POA: Diagnosis not present

## 2021-07-22 DIAGNOSIS — R188 Other ascites: Secondary | ICD-10-CM | POA: Diagnosis not present

## 2021-07-22 DIAGNOSIS — A419 Sepsis, unspecified organism: Secondary | ICD-10-CM | POA: Diagnosis not present

## 2021-07-22 DIAGNOSIS — K7682 Hepatic encephalopathy: Secondary | ICD-10-CM

## 2021-07-22 LAB — COMPREHENSIVE METABOLIC PANEL
ALT: 20 U/L (ref 0–44)
AST: 48 U/L — ABNORMAL HIGH (ref 15–41)
Albumin: 1.9 g/dL — ABNORMAL LOW (ref 3.5–5.0)
Alkaline Phosphatase: 56 U/L (ref 38–126)
Anion gap: 12 (ref 5–15)
BUN: 33 mg/dL — ABNORMAL HIGH (ref 6–20)
CO2: 25 mmol/L (ref 22–32)
Calcium: 9.4 mg/dL (ref 8.9–10.3)
Chloride: 97 mmol/L — ABNORMAL LOW (ref 98–111)
Creatinine, Ser: 3.48 mg/dL — ABNORMAL HIGH (ref 0.61–1.24)
GFR, Estimated: 20 mL/min — ABNORMAL LOW (ref >60)
Glucose, Bld: 89 mg/dL (ref 70–99)
Potassium: 4.7 mmol/L (ref 3.5–5.1)
Sodium: 134 mmol/L — ABNORMAL LOW (ref 135–145)
Total Bilirubin: 10.3 mg/dL — ABNORMAL HIGH (ref 0.3–1.2)
Total Protein: 5.5 g/dL — ABNORMAL LOW (ref 6.5–8.1)

## 2021-07-22 LAB — CBC
HCT: 34.5 % — ABNORMAL LOW (ref 39.0–52.0)
Hemoglobin: 11.9 g/dL — ABNORMAL LOW (ref 13.0–17.0)
MCH: 33.6 pg (ref 26.0–34.0)
MCHC: 34.5 g/dL (ref 30.0–36.0)
MCV: 97.5 fL (ref 80.0–100.0)
Platelets: 169 10*3/uL (ref 150–400)
RBC: 3.54 MIL/uL — ABNORMAL LOW (ref 4.22–5.81)
RDW: 21 % — ABNORMAL HIGH (ref 11.5–15.5)
WBC: 17.9 10*3/uL — ABNORMAL HIGH (ref 4.0–10.5)
nRBC: 0.5 % — ABNORMAL HIGH (ref 0.0–0.2)

## 2021-07-22 LAB — GLUCOSE, CAPILLARY
Glucose-Capillary: 85 mg/dL (ref 70–99)
Glucose-Capillary: 78 mg/dL (ref 70–99)
Glucose-Capillary: 84 mg/dL (ref 70–99)

## 2021-07-22 LAB — BODY FLUID CELL COUNT WITH DIFFERENTIAL
Eos, Fluid: 0 %
Lymphs, Fluid: 0 %
Monocyte-Macrophage-Serous Fluid: 5 % — ABNORMAL LOW (ref 50–90)
Neutrophil Count, Fluid: 95 % — ABNORMAL HIGH (ref 0–25)
Total Nucleated Cell Count, Fluid: 1990 cu mm — ABNORMAL HIGH (ref 0–1000)

## 2021-07-22 LAB — ALBUMIN, PLEURAL OR PERITONEAL FLUID: Albumin, Fluid: <1.5 g/dL

## 2021-07-22 LAB — AMMONIA: Ammonia: 54 umol/L — ABNORMAL HIGH (ref 9–35)

## 2021-07-22 LAB — PROTEIN, PLEURAL OR PERITONEAL FLUID: Total protein, fluid: <3 g/dL

## 2021-07-22 MED ORDER — ALBUMIN HUMAN 25 % IV SOLN: 25.0000 g | Freq: Once | INTRAVENOUS | Status: DC

## 2021-07-22 MED ORDER — MORPHINE SULFATE (PF) 2 MG/ML IV SOLN
2.0000 mg | INTRAVENOUS | Status: DC | PRN
  Administered 2021-07-23: 4 mg via INTRAVENOUS
  Administered 2021-07-23 (×2): 2 mg via INTRAVENOUS
  Filled 2021-07-22: qty 2
  Filled 2021-07-22 (×2): qty 1

## 2021-07-22 MED ORDER — HYDROCORTISONE ACETATE 25 MG RE SUPP
25.0000 mg | Freq: Two times a day (BID) | RECTAL | Status: DC
  Administered 2021-07-22 – 2021-07-24 (×2): 25 mg via RECTAL
  Filled 2021-07-22 (×5): qty 1

## 2021-07-22 MED ORDER — LORAZEPAM 2 MG/ML IJ SOLN
1.0000 mg | INTRAMUSCULAR | Status: DC | PRN
  Administered 2021-07-22: 2 mg via INTRAVENOUS
  Filled 2021-07-22: qty 1

## 2021-07-22 MED ORDER — ALBUMIN HUMAN 25 % IV SOLN
12.5000 g | Freq: Once | INTRAVENOUS | Status: AC
  Administered 2021-07-22: 12.5 g via INTRAVENOUS
  Filled 2021-07-22: qty 50

## 2021-07-22 MED ORDER — LACTULOSE 10 GM/15ML PO SOLN
10.0000 g | Freq: Two times a day (BID) | ORAL | Status: DC
  Administered 2021-07-22: 10 g via ORAL
  Filled 2021-07-22: qty 15

## 2021-07-22 MED ORDER — ALBUMIN HUMAN 25 % IV SOLN
25.0000 g | INTRAVENOUS | Status: DC
Start: 2021-07-22 — End: 2021-07-22
  Administered 2021-07-22: 25 g via INTRAVENOUS
  Filled 2021-07-22: qty 100

## 2021-07-22 MED ORDER — ATROPINE SULFATE 1 % OP SOLN
1.0000 [drp] | Freq: Four times a day (QID) | OPHTHALMIC | Status: DC | PRN
  Filled 2021-07-22: qty 2

## 2021-07-22 MED ORDER — ALBUMIN HUMAN 25 % IV SOLN: 12.5000 g | Freq: Once | INTRAVENOUS | Status: AC

## 2021-07-22 NOTE — Progress Notes (Signed)
[facility] Progress Note Patient Name: Brian [name] DOB: 1961/08/16 MRN: [id number]   Date of Service  07/22/2021  HPI/Events of Note  Ordered labs reviewed.  eICU Interventions  No intervention.        Kerry Kass [name] 07/22/2021, 5:39 AM

## 2021-07-22 NOTE — Progress Notes (Signed)
GoC discussion with 9 family members to discuss his ongoing care, prognosis, and his overall health status and QOL. His family is concerned about his poor functional status and does not think he would be able to live independently in the future and thinks he would not want to live in a nursing home.   They talked among themselves and feel that the best way to move forward is to proceed with focusing on comfort and withdraw life prolonging measures. Comfort orders have been placed. RN Threasa Beards present during discussion.  Additional CC time: 30 min.  Julian Hy, DO 07/22/21 4:02 PM [facility]

## 2021-07-22 NOTE — Procedures (Signed)
Paracentesis Procedure Note  Brian [name]  [id number]  1961/04/16  Date:07/22/21  Time:11:56 AM   Provider Performing:Brian [name] [name]    Procedure: Paracentesis with imaging guidance (01586)  Indication(s) Ascites  Consent Risks of the procedure as well as the alternatives and risks of each were explained to the patient and/or caregiver.  Consent for the procedure was obtained and is signed in the bedside chart  Anesthesia Topical only with 1% lidocaine    Time Out Verified patient identification, verified procedure, site/side was marked, verified correct patient position, special equipment/implants available, medications/allergies/relevant history reviewed, required imaging and test results available.   Sterile Technique Maximal sterile technique including full sterile barrier drape, hand hygiene, sterile gown, sterile gloves, mask, hair covering, sterile ultrasound probe cover (if used).   Procedure Description Ultrasound used to identify appropriate peritoneal anatomy for placement and overlying skin marked.  Area of drainage cleaned and draped in sterile fashion. Lidocaine was used to anesthetize the skin and subcutaneous tissue.  5L of amber appearing fluid was drained. Catheter then removed and bandaid applied to site. 25 g 25% albumin infused while prepping, will infuse another 12.5 g 25% albumin now.   Complications/Tolerance None; patient tolerated the procedure well.   EBL Minimal   Specimen(s) Peritoneal fluid

## 2021-07-22 NOTE — Progress Notes (Addendum)
Pt remains restless, pulling gown off, pulling at covers, b/p cuff, IV, leads.  RN remains at bedside reminding pt frequently not to pull or remove items.  Pt is unable to be left alone at this time.  Pt is unable to answer questions that he previously had answered. Repositioned and awaiting orders

## 2021-07-22 NOTE — Progress Notes (Signed)
eLink Physician-Brief Progress Note Patient Name: Brian [name] DOB: 21-Oct-1960 MRN: [id number]   Date of Service  07/22/2021  HPI/Events of Note  Patient is encephalopathic, he has ESRD, Cirrhosis of the liver, and has had low normal recent blood sugar measurements.  eICU Interventions  CMP, CBG, and ammonia level ordered.        Kerry Kass Ashanty Coltrane 07/22/2021, 3:46 AM

## 2021-07-22 NOTE — Progress Notes (Signed)
NAME:  Brian [name], MRN:  [id number], DOB:  [birth year], LOS: 3 ADMISSION DATE:  07/29/2021, CONSULTATION DATE:  07/26/2021 REFERRING MD:  Tamsen Snider, CHIEF COMPLAINT:  Hypotension   Brief History   Brian [name] is a 60 y/o gentleman with a history of HTN, ESRD, DM, cirrhosis who presented with confusion and fatigue that has been noticeable since earlier today per his son. He did well in PT, but after was more confused. Per triage notes, he has had declining health status since his BKA 2 weeks ago. Prior to his surgery he lived at home. He has been going to his HD sessions as scheduled, most recently yesterday on 10/12. He endorses some SOB since being laid in trendelenburg, but denies other complaints. No abdominal pain. He had follow up with orthopedics earlier this week with no concern for infection. He has a decubitus ulcer on his buttocks.    In the ED he was hypotensive with Bps 70-80s/50s. He was given 500cc IVF without response. Blood cultures have been drawn and vanc, cefepime started. His sister and son are at bedside. He does not have completed POA paperwork but brought it at the time of surgery and it was never notarized.   Significant Hospital Events   10/13 Admitted, diagnostic para 10/16 self-removed cortrak tube, more confused  Consults:  Nephrology   Procedures:  10/13 Paracentesis (70cc amber fluid) 10/15 HD  Interim History/Subjective:  More confused today.   Objective:  Blood pressure 116/90, pulse 72, temperature 97.6 F (36.4 C), temperature source Axillary, resp. rate (!) 28, height 5\' 9"  (1.753 m), weight 83 kg, SpO2 98 %.        Intake/Output Summary (Last 24 hours) at 07/22/2021 0936 Last data filed at 07/22/2021 0900 Gross per 24 hour  Intake 495.1 ml  Output 1000 ml  Net -504.9 ml    Filed Weights   07/20/21 0500 07/21/21 1037 07/21/21 1353  Weight: 83.9 kg 84 kg 83 kg   Examination: General: chronically ill appearing man lying in bed in NAD HENT:  [place]/AT, eyes anicteric Lungs: breathing comfortably on RA, faint rhales Cardiovascular: S1S2, RRR Abdomen: distended but nontender, hypoactive bowel sounds Extremities: R BKA, edema in LLE Neuro: awake, not oriented, very delayed verbal responses, moving around in bed, but short attention span Skin: warm, dry, no rashes  Peritoneal culture> NG Blood cx NGTD WBC 17.9 H/H 11.9/34.5 K+ 4.7 Ammonia 22 T bili 10.3  Resolved Hospital Problem list   N/a  Assessment & Plan:  Brian [name] is a 60 y.o. 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  Septic shock due to spontaneous bacterial peritonitis; shock resolved -con't cefepime; will need long-term prophylaxis -con't to follow fluid cultures -con't midodrine -with ongoing soft BPs, needs to stay in ICU for HD in case he requires pressors after paracenteiss   Acute metabolic encephalopathy due to sepsis, likely a component of hepatic encephalopathy. -start lactulose; RN thinks he can swallow but may need to place another NGT -may need rifaximin -avoid sedation -mental status needs ongoing ICU monitoring   ESRD -HD per nephro; appreciate their management -phos binder -renal diet   Diabetes type 2 with hypoglycemia -accuchecks    HTN -hold PTA Bblocker due to low  BPs   Cryptogenic cirrhosis (MELD-Na+ 32) Ascites   Chronic portal gastropathy, 1 small esophageal varix Mild coagulopathy Hyperbilirubinemia; acutely likely decompensated due to sepsis -Eventually needs large volume paracentesis; will attempt today given improvement in BPs. Would prefer to avoid doing this on a dialysis day. -con't PTA PPI -needs lifelong antibiotic prophylaxis -lactulose    Unstageable sacral decubitus ulcer, POA -wound care, frequent turns   Hypervolemic hyponatremia  -monitor electrolytes -management with HD  Chronic anemia due to ESRD -transfuse for Hb<7 or hemodynamically significant bleeding  Tobacco dependence -recommend smoking cessation  Hemorrhoids -anusol suppositories  Best Practice:  Diet: Dysphagia diet DVT prophylaxis: prophylactic heparin  GI prophylaxis: PPI Glucose control: CBG monitoring /SSI prn Lines: N/A Foley: Foley:  N/A Mobility: bed rest Code Status: DNR Family Communication: 10/14: Patient family decided to keep him DNR, continue current medical care Disposition: ICU  Labs   CBC: Recent Labs  Lab 07/16/2021 1648 07/20/21 0234 07/21/21 0605 07/22/21 0243  WBC 15.7* 17.5* 17.7* 17.9*  NEUTROABS 13.1*  --   --   --   HGB 12.2* 11.9* 11.9* 11.9*  HCT 37.2* 34.6* 35.4* 34.5*  MCV 100.0 96.6 98.9 97.5  PLT 240 239 193 169     Basic Metabolic Panel: Recent Labs  Lab 07/20/2021 1648 07/20/21 0234 07/20/21 1657 07/21/21 0605 07/22/21 0243  NA 136 134*  --  131* 134*  K 4.5 3.9  --  4.1 4.7  CL 94* 95*  --  92* 97*  CO2 27 29  --  25 25  GLUCOSE 53* 86  --  106* 89  BUN 36* 34*  --  46* 33*  CREATININE 3.92* 3.74*  --  4.54* 3.48*  CALCIUM 8.8* 9.1  --  9.0 9.4  MG  --  2.0 2.0 2.0  --   PHOS  --  4.2 4.5 4.7*  --     GFR: Estimated Creatinine Clearance: 23.1 mL/min (A) (by C-G formula based on SCr of 3.48 mg/dL (H)). Recent Labs  Lab 07/26/2021 1648 07/20/2021 1649 07/20/2021 2000 07/20/21 0234 07/21/21 0605 07/22/21 0243  WBC 15.7*  --   --  17.5* 17.7* 17.9*  LATICACIDVEN  --  3.2* 3.6*  --   --   --      Liver Function Tests: Recent Labs  Lab 07/25/2021 1648 07/21/21 0605 07/22/21 0243  AST 49* 35 48*  ALT 18 17 20   ALKPHOS 68 60 56  BILITOT 6.7* 8.2* 10.3*  PROT 5.4* 5.4* 5.5*  ALBUMIN 1.6* 1.5* 1.9*    No results for input(s): LIPASE, AMYLASE in the last 168 hours. Recent Labs   Lab 08/03/2021 1650 07/22/21 0416  AMMONIA 34 54*     This patient is critically ill with multiple organ system failure which requires frequent high complexity decision making, assessment, support, evaluation, and titration of therapies. This was completed through the application of advanced monitoring technologies and extensive interpretation of multiple databases. During this encounter critical care time was devoted to patient care services described in this note for 36 minutes.  Julian Hy, DO 07/22/21 10:24 AM [facility]

## 2021-07-22 NOTE — Progress Notes (Signed)
[facility] Progress Note   60 y.o. male with ESRD, T2DM, HTN, Hx CVA, cirrhosis, and recent R BKA on 07/06/21 who was admitted with sepsis. Brought to ED via EMS from [place] yesterday with concern for pressure ulcer on back and lethargy. Family reports confused for the past few days. In the ED, noted to be quite hypotensive. WBC 15.7, Hgb 12.2. Head CT negative. CXR 10/13 with small R effusion -> CXR with pulm edema s/p fluid resuscitation in ED. Flu/COVID negative. Blood Cx collected, started on Vanc/Cefepime. S/p diagnostic paracentesis -> high cell count c/w SBP.  Dialyzes on MWF schedule at [facility] - last HD 10/12 for which he completed a full treatment but with no UF as starting BP was 77/55.  Dialysis Orders:  MWF at [facility] 4hr, 4090/800, EDW 78kg, 2K/2Ca, AVF, heparin 3000 - Hectoral 36mcg IV q HD - No ESA  Assessment/ Plan:     Sepsis/SBP: S/p diagnostic paracentesis with ^ cell count, gram stain (-). Cx pending. Blood Cx pending. On Vanc/Cefepime.  ESRD:  Continue HD per usual MWF schedule  - HD 10/15 only able to get 1L net off  - Plan next HD for Monday; he appears more confused than yesterday. Midodrine and alb tomorrow w/ HD. Low tolerance for stopping pending his other comorbidities particularly the cirrhosis. He's having rapid accumulation of ascites despite just having a LVP.   Hypertension/volume: BP previously controlled on 2 anti-HTN meds, significant hypotension ongoing for at least a few days. OP meds on hold. Has significant ascites, LE edema and subq abdominal edema. UF as tolerated with HD. Will use midodrine and/or IV albumin prn.  Anemia of chronic disease: Hgb ~12, no ESA needed.  Metabolic bone disease: CorrCa high, Phos ok - hold VDRA and continue home binders.  Nutrition: Alb very low - in setting of liver disease and weight loss. Will add supplements. Cirrhosis - rapid accumulation; appears to have significant ascites again. Hx CVA Recent R  BKA - healing well per notes Sacral ulcer - per primary.  Subjective:   More confused this AM; oriented only to person.    Objective:   BP 99/78 (BP Location: Right Arm)   Pulse 69   Temp 97.6 F (36.4 C) (Axillary)   Resp 19   Ht 5\' 9"  (1.753 m)   Wt 83 kg   SpO2 98%   BMI 27.02 kg/m   Intake/Output Summary (Last 24 hours) at 07/22/2021 0819 Last data filed at 07/22/2021 0700 Gross per 24 hour  Intake 475.1 ml  Output 1000 ml  Net -524.9 ml   Weight change:   Physical Exam: General: Frail man, NAD. Nasal O2 in place, confused oriented only to person. Head: Normocephalic, atraumatic, sclera non-icteric, mucus membranes are moist. Neck: JVD not elevated. Lungs: Clear bilaterally to auscultation without wheezes, rales, or rhonchi.  Heart: RRR with normal S1, S2.  Abdomen: soft, but very distended with subcutaneous edema present Lower extremities: R stump bandaged; 2+ BLE edema Neuro: Alert and oriented X 2. Moves all extremities spontaneously. Dialysis Access: Lt Cimino AVF + bruit  Imaging: DG Abd Portable 1V  Result Date: 07/20/2021 CLINICAL DATA:  Feeding tube placement. EXAM: PORTABLE ABDOMEN - 1 VIEW COMPARISON:  None. FINDINGS: Weighted tip enteric feeding catheter with tip overlying the distal stomach. The bowel gas pattern is normal. Surgical clips overlie the abdomen. Upper abdominal peripherally calcified lesions consistent with calcified chronic pancreatic pseudocyst seen on prior CT August 18, 2018. Small right pleural effusion  with bibasilar interstitial and airspace opacities. IMPRESSION: 1. Enteric feeding catheter weighted tip overlies the distal stomach. Electronically Signed   By: Dahlia Bailiff M.D.   On: 07/20/2021 17:23    Labs: BMET Recent Labs  Lab 07/10/2021 1648 07/20/21 0234 07/20/21 1657 07/21/21 0605 07/22/21 0243  NA 136 134*  --  131* 134*  K 4.5 3.9  --  4.1 4.7  CL 94* 95*  --  92* 97*  CO2 27 29  --  25 25  GLUCOSE 53* 86  --   106* 89  BUN 36* 34*  --  46* 33*  CREATININE 3.92* 3.74*  --  4.54* 3.48*  CALCIUM 8.8* 9.1  --  9.0 9.4  PHOS  --  4.2 4.5 4.7*  --    CBC Recent Labs  Lab 07/22/2021 1648 07/20/21 0234 07/21/21 0605 07/22/21 0243  WBC 15.7* 17.5* 17.7* 17.9*  NEUTROABS 13.1*  --   --   --   HGB 12.2* 11.9* 11.9* 11.9*  HCT 37.2* 34.6* 35.4* 34.5*  MCV 100.0 96.6 98.9 97.5  PLT 240 239 193 169    Medications:     (feeding supplement) PROSource Plus  30 mL Oral BID BM   Chlorhexidine Gluconate Cloth  6 each Topical Daily   feeding supplement (NEPRO CARB STEADY)  237 mL Oral TID BM   heparin  5,000 Units Subcutaneous Q8H   midodrine  10 mg Oral Q8H   multivitamin  1 tablet Oral QHS   pantoprazole  40 mg Oral QHS   sevelamer carbonate  1,600 mg Oral TID WC      Otelia Santee, MD 07/22/2021, 8:19 AM

## 2021-07-23 DIAGNOSIS — A419 Sepsis, unspecified organism: Secondary | ICD-10-CM | POA: Diagnosis not present

## 2021-07-23 DIAGNOSIS — E162 Hypoglycemia, unspecified: Secondary | ICD-10-CM

## 2021-07-23 DIAGNOSIS — R6521 Severe sepsis with septic shock: Secondary | ICD-10-CM | POA: Diagnosis not present

## 2021-07-23 DIAGNOSIS — I959 Hypotension, unspecified: Secondary | ICD-10-CM

## 2021-07-23 DIAGNOSIS — Z515 Encounter for palliative care: Secondary | ICD-10-CM

## 2021-07-23 MED ORDER — BISACODYL 10 MG RE SUPP: 10.0000 mg | Freq: Every day | RECTAL | Status: DC | PRN

## 2021-07-23 MED ORDER — ONDANSETRON HCL 4 MG/2ML IJ SOLN: 4.0000 mg | Freq: Four times a day (QID) | INTRAMUSCULAR | Status: DC | PRN

## 2021-07-23 MED ORDER — HYDROMORPHONE HCL 1 MG/ML IJ SOLN
1.0000 mg | Freq: Four times a day (QID) | INTRAMUSCULAR | Status: DC
  Administered 2021-07-23 – 2021-07-24 (×5): 1 mg via INTRAVENOUS
  Filled 2021-07-23 (×5): qty 1

## 2021-07-23 MED ORDER — HYDROMORPHONE HCL 1 MG/ML IJ SOLN: 1.0000 mg | INTRAMUSCULAR | Status: DC | PRN

## 2021-07-23 NOTE — Progress Notes (Signed)
NAME:  Brian [name], MRN:  [id number], DOB:  [birth year], LOS: 4 ADMISSION DATE:  08/01/2021, CONSULTATION DATE:  07/12/2021 REFERRING MD:  Tamsen Snider, CHIEF COMPLAINT:  Hypotension   Brief History   [name] is a 60 y/o gentleman with a history of HTN, ESRD, DM, cirrhosis who presented with confusion and fatigue that has been noticeable since earlier today per his son. He did well in PT, but after was more confused. Per triage notes, he has had declining health status since his BKA 2 weeks ago. Prior to his surgery he lived at home. He has been going to his HD sessions as scheduled, most recently yesterday on 10/12. He endorses some SOB since being laid in trendelenburg, but denies other complaints. No abdominal pain. He had follow up with orthopedics earlier this week with no concern for infection. He has a decubitus ulcer on his buttocks.    In the ED he was hypotensive with Bps 70-80s/50s. He was given 500cc IVF without response. Blood cultures have been drawn and vanc, cefepime started. His sister and son are at bedside. He does not have completed POA paperwork but brought it at the time of surgery and it was never notarized.   Significant Hospital Events   10/13 Admitted 10/14 Dx paracentesis with SBP diagnosed 10/16 large volume paracentesis 10/16 GOC and comfort measures Consults:  Nephrology   Procedures:  10/13 Paracentesis (70cc amber fluid)   Interim History/Subjective:  No overnight event, Levophed was titrated off Remained afebrile  Objective:  Blood pressure 100/67, pulse 71, temperature 97.6 F (36.4 C), temperature source Axillary, resp. rate (!) 24, height 5\' 9"  (1.753 m), weight 83 kg, SpO2 93 %.        Intake/Output Summary (Last 24 hours) at 07/23/2021 1051 Last data filed at 07/23/2021 0600 Gross per 24 hour  Intake 123.5 ml  Output 0 ml  Net 123.5 ml   Filed Weights   07/20/21 0500 07/21/21 1037 07/21/21 1353  Weight: 83.9 kg 84 kg 83 kg    Examination: General: Acute on chronically ill appearing middle-aged male, looks older than his age, lying on the bed HENT: Atraumatic, normocephalic scleral icterus, EOM intact and PERRLA Lungs: clear to ascultation bilaterally.  Cardiovascular: RRR, no obvious murmurs  Abdomen: distended, fluid wave present, positive bowel sounds Extremities: cool and dry, BKA present on right and wrapped Neuro: tremor with possible asterixis, slow speech, but alert and oriented. 3 Skin: No rash  Patient Lines/Drains/Airways Status     Active Line/Drains/Airways     Name Placement date Placement time Site Days   Peripheral IV 07/18/2021 20 G Right Antecubital 07/29/2021  1722  Antecubital  1   Peripheral IV 07/21/2021 22 G Posterior;Right Hand 08/04/2021  2003  Hand  1   Fistula / Graft Left Forearm Arteriovenous fistula 05/16/17  1542  Forearm  1526   Incision (Closed) 05/16/17 Arm Left 05/16/17  1525  -- 1526   Incision (Closed) 03/27/20 Foot Right 03/27/20  1256  -- 480   Incision (Closed) 03/30/20 Foot Right 03/30/20  1306  -- 477   Incision (Closed) 07/06/21 Knee Right 07/06/21  1321  -- 14   Pressure Injury 07/06/21 Medial Stage 2 -  Partial thickness loss of dermis presenting as a shallow open injury with a red, pink wound bed without slough. 07/06/21  1930  -- 14   Wound / Incision (Open or Dehisced) 03/26/20 Diabetic ulcer Foot Right 03/26/20  1800  Foot  481   Wound /  Incision (Open or Dehisced) 03/26/20 Diabetic ulcer Other (Comment) Right plantar ulceration 03/26/20  1800  Other (Comment)  481        Resolved Hospital Problem list   N/a  Assessment & Plan:  Brian [name] is a 60 y.o. 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  GOC: - Comfort care per family discuss - Consulted palliative care for assistance with symptom control  - In hospital death versus in patient hospice.   Best Practice:  Diet: Dysphagia diet DVT prophylaxis: SCD GI prophylaxis: PPI Glucose control: CBG monitoring /SSI prn Lines: N/A Foley: Foley:  N/A Mobility: bed rest Code Status: DNR Family Communication: 10/14: Patient family decided to keep him DNR, continue current medical care Disposition: ICU  Labs   CBC: Recent Labs  Lab 08/02/2021 1648 07/20/21 0234 07/21/21 0605 07/22/21 0243  WBC 15.7* 17.5* 17.7* 17.9*  NEUTROABS 13.1*  --   --   --   HGB 12.2* 11.9* 11.9* 11.9*  HCT 37.2* 34.6* 35.4* 34.5*  MCV 100.0 96.6 98.9 97.5  PLT 240 239 193 161    Basic Metabolic Panel: Recent Labs  Lab 08/04/2021 1648 07/20/21 0234 07/20/21 1657 07/21/21 0605 07/22/21 0243  NA 136 134*  --  131* 134*  K 4.5 3.9  --  4.1 4.7  CL 94* 95*  --  92* 97*  CO2 27 29  --  25 25  GLUCOSE 53* 86  --  106* 89  BUN 36* 34*  --  46* 33*  CREATININE 3.92* 3.74*  --  4.54* 3.48*  CALCIUM 8.8* 9.1  --  9.0 9.4  MG  --  2.0 2.0 2.0  --   PHOS  --  4.2 4.5 4.7*  --    GFR: Estimated Creatinine Clearance: 23.1 mL/min (A) (by C-G formula based on SCr of 3.48 mg/dL (H)). Recent Labs  Lab 07/23/2021 1648 07/07/2021 1649 08/01/2021 2000 07/20/21 0234 07/21/21 0605 07/22/21 0243  WBC 15.7*  --   --  17.5* 17.7* 17.9*  LATICACIDVEN  --  3.2* 3.6*  --   --   --     Liver Function Tests: Recent Labs  Lab 07/25/2021 1648 07/21/21 0605 07/22/21 0243  AST 49* 35 48*  ALT 18 17 20   ALKPHOS 68 60 56  BILITOT 6.7* 8.2* 10.3*  PROT 5.4* 5.4* 5.5*  ALBUMIN 1.6* 1.5* 1.9*   No results for input(s): LIPASE, AMYLASE in the last 168 hours. Recent Labs  Lab 07/30/2021 1650 07/22/21 0416  AMMONIA 34 54*    ABG    Component Value Date/Time   TCO2 31 07/06/2021 0954     Coagulation Profile: Recent Labs  Lab 08/06/2021 1648  07/21/21 0605  INR 1.5* 1.5*    Cardiac Enzymes: No results for input(s): CKTOTAL, CKMB, CKMBINDEX, TROPONINI in the last 168 hours.  HbA1C: Hgb A1c MFr Bld  Date/Time Value Ref Range Status  07/07/2021 03:26 AM 4.7 (L) 4.8 - 5.6 % Final    Comment:    (NOTE) Pre diabetes:          5.7%-6.4%  Diabetes:              >6.4%  Glycemic control for   <7.0% adults  with diabetes   03/26/2020 11:38 AM 6.5 (H) 4.8 - 5.6 % Final    Comment:    (NOTE) Pre diabetes:          5.7%-6.4%  Diabetes:              >6.4%  Glycemic control for   <7.0% adults with diabetes     CBG: Recent Labs  Lab 07/21/21 2131 07/21/21 2311 07/22/21 0349 07/22/21 0732 07/22/21 1529  GLUCAP 71 96 85 78 84   Lawerance Cruel, D.O.  Internal Medicine Resident, PGY-3 [facility]  Pager: [phone] 10:54 AM, 07/23/2021

## 2021-07-23 NOTE — Progress Notes (Signed)
Patient will be transferred out of the ICU. TRH will assume care tomorrow morning. Palliative care is following.
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
## 2021-07-23 NOTE — Progress Notes (Signed)
[place] attempted visit per [facility] consult to assist w/advance directive education; per RN pt. is minimally responsive at this time and has been placed on comfort care per meeting w/family.  No family present at this time but RN will call/page when they arrive later today if support is needed or requested.  Lindaann Pascal, Chaplain Pager: [phone]

## 2021-07-24 DIAGNOSIS — R6521 Severe sepsis with septic shock: Secondary | ICD-10-CM | POA: Diagnosis not present

## 2021-07-24 DIAGNOSIS — E43 Unspecified severe protein-calorie malnutrition: Secondary | ICD-10-CM | POA: Diagnosis not present

## 2021-07-24 DIAGNOSIS — A419 Sepsis, unspecified organism: Secondary | ICD-10-CM | POA: Diagnosis not present

## 2021-07-24 DIAGNOSIS — I959 Hypotension, unspecified: Secondary | ICD-10-CM | POA: Diagnosis present

## 2021-07-24 DIAGNOSIS — K652 Spontaneous bacterial peritonitis: Secondary | ICD-10-CM | POA: Diagnosis present

## 2021-07-24 DIAGNOSIS — Z515 Encounter for palliative care: Secondary | ICD-10-CM | POA: Diagnosis not present

## 2021-07-24 DIAGNOSIS — D72825 Bandemia: Secondary | ICD-10-CM | POA: Diagnosis present

## 2021-07-24 LAB — CULTURE, BLOOD (ROUTINE X 2)
Culture: NO GROWTH
Culture: NO GROWTH
Special Requests: ADEQUATE

## 2021-07-24 LAB — PATHOLOGIST SMEAR REVIEW

## 2021-07-24 MED ORDER — NAPHAZOLINE-GLYCERIN 0.012-0.25 % OP SOLN
1.0000 [drp] | Freq: Four times a day (QID) | OPHTHALMIC | Status: DC | PRN
  Administered 2021-07-24: 2 [drp] via OPHTHALMIC
  Filled 2021-07-24: qty 15

## 2021-07-24 MED ORDER — NAPHAZOLINE-GLYCERIN 0.012-0.2 % OP SOLN: 1.0000 [drp] | Freq: Four times a day (QID) | OPHTHALMIC | Status: DC | PRN

## 2021-07-25 ENCOUNTER — Telehealth: Payer: Self-pay | Admitting: *Deleted

## 2021-07-25 LAB — BODY FLUID CULTURE W GRAM STAIN: Culture: NO GROWTH

## 2021-07-25 LAB — CULTURE, BODY FLUID W GRAM STAIN -BOTTLE: Culture: NO GROWTH

## 2021-07-25 LAB — VITAMIN C: Vitamin C: <0.1 mg/dL — ABNORMAL LOW (ref 0.4–2.0)

## 2021-07-25 NOTE — Telephone Encounter (Signed)
Brian [name] is calling to make Dr Jacqualyn Posey aware of patient's passing.

## 2021-07-26 ENCOUNTER — Ambulatory Visit: Payer: Medicare Other | Admitting: Orthopedic Surgery

## 2021-07-28 LAB — TOTAL BILIRUBIN, BODY FLUID: Total bilirubin, fluid: 1.3 mg/dL

## 2021-08-07 NOTE — Progress Notes (Signed)
This chaplain responded to PMT consult for EOL spiritual care.  The chaplain is appreciative of the RN-Tamekia's update before the visit.   Family is not at the bedside. The Pt. Appears to be resting comfortably and does not respond to the call of his name or gentle touch. The chaplain understands the family has requested a phone call if EOL is near.  The chaplain shared a bedside presence, sacred silence, along with prayer with the Pt.  This chaplain is available for F/U spiritual care with the Pt. and family as needed.  Chaplain Sallyanne Kuster [phone]

## 2021-08-07 NOTE — Death Summary Note (Signed)
DEATH SUMMARY   Patient Details  Name: Brian [name] MRN: [id number] DOB: [birth year]  Admission/Discharge Information   Admit Date:  07/30/2021  Date of Death: Date of Death: 08/04/21  Time of Death: Time of Death: 01-07-38  Length of Stay: 5  Referring Physician: [name], MD   Reason(s) for Hospitalization  Altered mental status and fatigue  Diagnoses  Preliminary cause of death: Severe sepsis ([place]) Secondary Diagnoses (including complications and co-morbidities):  Principal Problem:   Severe sepsis [facility]) Active Problems:   ESRD on dialysis ([place])   Diabetes mellitus without complication ([place])   Anasarca- may 2018 hosp   Tobacco abuse- >er than 55 pack yr hx; current smoker   Leukocytosis- s/p plenectomy   Type 1 diabetes mellitus with kidney complication (HCC) - on insulin   Coagulopathy (HCC)   Cirrhosis of liver with ascites ([place])   End of life care   History of CVA (cerebrovascular accident)   Protein-calorie malnutrition, severe   SBP (spontaneous bacterial peritonitis) ([employer])   Hypotension   Bandemia   Brief Hospital Course (including significant findings, care, treatment, and services provided and events leading to death)  Brian [name] is a 60 y.o. year old male with PMH of liver cirrhosis with ascites, ESRD, DM-1, unstageable sacral decubitus, hyponatremia, portal gastropathy, HTN, right BKA, tobacco use disorder and splenectomy presenting with altered mental status and fatigue, and admitted to ICU with severe sepsis in the setting of spontaneous bacterial peritonitis.  Started on broad-spectrum antibiotics, IV fluid and stress dose steroid.  He has paracentesis with removal of large volume ascites.  Fluid study confirmed SBP.  Given lack of significant improvement, palliative medicine consulted.  After goal of care discussion, patient was transitioned to full comfort care, and transferred out of ICU to [facility] hospital[facility] service on 08/04/21, and passed  away later in the afternoon.  Patient and family has been visited in patient's room by this Probation officer after patient's death  Pertinent Labs and Studies  Significant Diagnostic Studies CT HEAD WO CONTRAST (5MM)  Result Date: 07/20/2021 CLINICAL DATA:  Mental status change EXAM: CT HEAD WITHOUT CONTRAST TECHNIQUE: Contiguous axial images were obtained from the base of the skull through the vertex without intravenous contrast. COMPARISON:  11/07/2008 FINDINGS: Brain: No evidence of acute infarction, hemorrhage, hydrocephalus, extra-axial collection or mass lesion/mass effect. Remote left posterior basal ganglia infarct, image 16/4. There is mild diffuse low-attenuation within the subcortical and periventricular white matter compatible with chronic microvascular disease. Vascular: No hyperdense vessel or unexpected calcification. Skull: Normal. Negative for fracture or focal lesion. Sinuses/Orbits: No acute finding. Other: None IMPRESSION: 1. No acute intracranial abnormalities. 2. Chronic small vessel ischemic change and brain atrophy. Electronically Signed   By: Kerby Moors M.D.   On: 07/20/2021 05:51   DG Chest Port 1 View  Result Date: 07/20/2021 CLINICAL DATA:  Respiratory failure. EXAM: PORTABLE CHEST 1 VIEW COMPARISON:  2021-07-30 FINDINGS: Lung volumes are low. Signs of a small right pleural effusion is identified. Interval development of bilateral interstitial and airspace opacities. Visualized osseous structures appear intact. IMPRESSION: 1. Interval development of bilateral interstitial and airspace opacities compatible with pulmonary edema versus multifocal pneumonia. 2. Small right pleural effusion. Electronically Signed   By: Kerby Moors M.D.   On: 07/20/2021 06:50   DG Chest Port 1 View  Result Date: July 30, 2021 CLINICAL DATA:  Shortness of breath and weakness. EXAM: PORTABLE CHEST 1 VIEW COMPARISON:  Feb 05, 2017 FINDINGS: Low lung volumes are noted. Very  mild atelectasis is seen within  the right lung base. There is a small right pleural effusion. Mild elevation of the right hemidiaphragm is seen. No pneumothorax is identified. The cardiac silhouette is enlarged and unchanged in size. Surgical clips are seen overlying the left upper quadrant. The visualized skeletal structures are unremarkable. IMPRESSION: Low lung volumes with mild right basilar atelectasis and small right pleural effusion. Electronically Signed   By: Virgina Norfolk M.D.   On: 07/14/2021 17:16   DG Abd Portable 1V  Result Date: 07/20/2021 CLINICAL DATA:  Feeding tube placement. EXAM: PORTABLE ABDOMEN - 1 VIEW COMPARISON:  None. FINDINGS: Weighted tip enteric feeding catheter with tip overlying the distal stomach. The bowel gas pattern is normal. Surgical clips overlie the abdomen. Upper abdominal peripherally calcified lesions consistent with calcified chronic pancreatic pseudocyst seen on prior CT August 18, 2018. Small right pleural effusion with bibasilar interstitial and airspace opacities. IMPRESSION: 1. Enteric feeding catheter weighted tip overlies the distal stomach. Electronically Signed   By: Dahlia Bailiff M.D.   On: 07/20/2021 17:23    Microbiology Recent Results (from the past 240 hour(s))  Blood Culture (routine x 2)     Status: None   Collection Time: 07/12/2021  4:30 PM   Specimen: BLOOD RIGHT ARM  Result Value Ref Range Status   Specimen Description BLOOD RIGHT ARM  Final   Special Requests   Final    BOTTLES DRAWN AEROBIC AND ANAEROBIC Blood Culture results may not be optimal due to an inadequate volume of blood received in culture bottles   Culture   Final    NO GROWTH 5 DAYS Performed at [facility], [place] [address], [place], [place] [address]    Report Status August 02, 2021 FINAL  Final  Resp Panel by RT-PCR (Flu A&B, Covid) Nasopharyngeal Swab     Status: None   Collection Time: 07/11/2021  6:26 PM   Specimen: Nasopharyngeal Swab; Nasopharyngeal(NP) swabs in vial transport medium   Result Value Ref Range Status   SARS Coronavirus 2 by RT PCR NEGATIVE NEGATIVE Final    Comment: (NOTE) SARS-CoV-2 target nucleic acids are NOT DETECTED.  The SARS-CoV-2 RNA is generally detectable in upper respiratory specimens during the acute phase of infection. The lowest concentration of SARS-CoV-2 viral copies this assay can detect is 138 copies/mL. A negative result does not preclude SARS-Cov-2 infection and should not be used as the sole basis for treatment or other patient management decisions. A negative result may occur with  improper specimen collection/handling, submission of specimen other than nasopharyngeal swab, presence of viral mutation(s) within the areas targeted by this assay, and inadequate number of viral copies(<138 copies/mL). A negative result must be combined with clinical observations, patient history, and epidemiological information. The expected result is Negative.  Fact Sheet for Patients:  EntrepreneurPulse.com.au  Fact Sheet for Healthcare Providers:  IncredibleEmployment.be  This test is no t yet approved or cleared by the [place] FDA and  has been authorized for detection and/or diagnosis of SARS-CoV-2 by FDA under an Emergency Use Authorization (EUA). This EUA will remain  in effect (meaning this test can be used) for the duration of the COVID-19 declaration under Section 564(b)(1) of the Act, 21 U.S.C.section 360bbb-3(b)(1), unless the authorization is terminated  or revoked sooner.       Influenza A by PCR NEGATIVE NEGATIVE Final   Influenza B by PCR NEGATIVE NEGATIVE Final    Comment: (NOTE) The Xpert Xpress SARS-CoV-2/FLU/RSV plus assay is intended as an  aid in the diagnosis of influenza from Nasopharyngeal swab specimens and should not be used as a sole basis for treatment. Nasal washings and aspirates are unacceptable for Xpert Xpress SARS-CoV-2/FLU/RSV testing.  Fact Sheet for  Patients: EntrepreneurPulse.com.au  Fact Sheet for Healthcare Providers: IncredibleEmployment.be  This test is not yet approved or cleared by the [place] FDA and has been authorized for detection and/or diagnosis of SARS-CoV-2 by FDA under an Emergency Use Authorization (EUA). This EUA will remain in effect (meaning this test can be used) for the duration of the COVID-19 declaration under Section 564(b)(1) of the Act, 21 U.S.C. section 360bbb-3(b)(1), unless the authorization is terminated or revoked.  Performed at [facility], [place] [address], [place], [place] [address]   Blood Culture (routine x 2)     Status: None   Collection Time: 07/22/2021  8:00 PM   Specimen: BLOOD RIGHT HAND  Result Value Ref Range Status   Specimen Description BLOOD RIGHT HAND  Final   Special Requests   Final    BOTTLES DRAWN AEROBIC ONLY Blood Culture adequate volume   Culture   Final    NO GROWTH 5 DAYS Performed at [facility], [place] [address] [place], [place] [address]    Report Status 08-10-21 FINAL  Final  Culture, body fluid w Gram Stain-bottle     Status: None (Preliminary result)   Collection Time: 08/05/2021  9:58 PM   Specimen: Fluid  Result Value Ref Range Status   Specimen Description FLUID PERITONEAL  Final   Special Requests BOTTLES DRAWN AEROBIC AND ANAEROBIC  Final   Culture   Final    NO GROWTH 4 DAYS Performed at [facility], [place] [address], [place], [place] [address]    Report Status PENDING  Incomplete  Gram stain     Status: None   Collection Time: 08/05/2021  9:58 PM   Specimen: Fluid  Result Value Ref Range Status   Specimen Description FLUID PERITONEAL  Final   Special Requests NONE  Final   Gram Stain   Final    MODERATE WBC PRESENT, PREDOMINANTLY PMN NO ORGANISMS SEEN Performed at [facility], [place] [address] [place], [place] [address]    Report Status 07/20/2021 FINAL  Final  MRSA Next Gen by PCR,  Nasal     Status: None   Collection Time: 07/23/2021 10:07 PM   Specimen: Nasal Mucosa; Nasal Swab  Result Value Ref Range Status   MRSA by PCR Next Gen NOT DETECTED NOT DETECTED Final    Comment: (NOTE) The GeneXpert MRSA Assay (FDA approved for NASAL specimens only), is one component of a comprehensive MRSA colonization surveillance program. It is not intended to diagnose MRSA infection nor to guide or monitor treatment for MRSA infections. Test performance is not FDA approved in patients less than 80 years old. Performed at [facility], [place] [address] [place], [place] [address]   Peritoneal fluid culture w Gram Stain     Status: None (Preliminary result)   Collection Time: 07/22/21 11:58 AM   Specimen: Peritoneal Fluid  Result Value Ref Range Status   Specimen Description FLUID  Final   Special Requests  PERI  Final   Gram Stain   Final    FEW WBC PRESENT, PREDOMINANTLY PMN NO ORGANISMS SEEN    Culture   Final    NO GROWTH 2 DAYS Performed at [facility], [place] [address], [place], [place] [address]  Report Status PENDING  Incomplete    Lab Basic Metabolic Panel: Recent Labs  Lab 07/31/2021 1648 07/20/21 0234 07/20/21 1657 07/21/21 0605 07/22/21 0243  NA 136 134*  --  131* 134*  K 4.5 3.9  --  4.1 4.7  CL 94* 95*  --  92* 97*  CO2 27 29  --  25 25  GLUCOSE 53* 86  --  106* 89  BUN 36* 34*  --  46* 33*  CREATININE 3.92* 3.74*  --  4.54* 3.48*  CALCIUM 8.8* 9.1  --  9.0 9.4  MG  --  2.0 2.0 2.0  --   PHOS  --  4.2 4.5 4.7*  --    Liver Function Tests: Recent Labs  Lab 07/07/2021 1648 07/21/21 0605 07/22/21 0243  AST 49* 35 48*  ALT 18 17 20   ALKPHOS 68 60 56  BILITOT 6.7* 8.2* 10.3*  PROT 5.4* 5.4* 5.5*  ALBUMIN 1.6* 1.5* 1.9*   No results for input(s): LIPASE, AMYLASE in the last 168 hours. Recent Labs  Lab 07/30/2021 1650 07/22/21 0416  AMMONIA 34 54*   CBC: Recent Labs  Lab 07/12/2021 1648 07/20/21 0234 07/21/21 0605 07/22/21 0243   WBC 15.7* 17.5* 17.7* 17.9*  NEUTROABS 13.1*  --   --   --   HGB 12.2* 11.9* 11.9* 11.9*  HCT 37.2* 34.6* 35.4* 34.5*  MCV 100.0 96.6 98.9 97.5  PLT 240 239 193 169   Cardiac Enzymes: No results for input(s): CKTOTAL, CKMB, CKMBINDEX, TROPONINI in the last 168 hours. Sepsis Labs: Recent Labs  Lab 07/16/2021 1648 07/21/2021 1649 07/09/2021 2000 07/20/21 0234 07/21/21 0605 07/22/21 0243  WBC 15.7*  --   --  17.5* 17.7* 17.9*  LATICACIDVEN  --  3.2* 3.6*  --   --   --     Procedures/Operations  Paracentesis   Moksha Dorgan T Jenine Krisher 2021-08-09, 5:47 PM

## 2021-08-07 NOTE — Progress Notes (Signed)
Pt responsive to tactile stimuli this am with incontinent care and mouth care. Comfort care in place. Spoke with pt's sister, Zigmund Daniel, this am. Sister updated on pt's condition and given new room number.

## 2021-08-07 NOTE — Progress Notes (Signed)
Daily Progress Note   Patient Name: Brian [name]       Date: 18-Aug-2021 DOB: January 18, 1961  Age: 60 y.o. MRN#: [id number] Attending Physician: Mercy Riding, MD Primary Care Physician: [name], MD Admit Date: 07/18/2021  Reason for Consultation/Follow-up: Terminal Care  Subjective: Medical records reviewed. Discussed with RN Tamekia and assessed patient at the bedside. He is resting comfortably. Patient's sister Ulis Rias is present at the bedside and reports no signs of discomfort today.  Reviewed patient's symptom management with scheduled dilaudid and the availability of dilaudid and ativan as needed. Reviewed patient's periods of apnea and end of life expectations. Several family members will continue to visit with Brian [name].  Questions and concerns addressed. PMT contact information provided. PMT will continue to support holistically.  Length of Stay: 5  Current Medications: Scheduled Meds:   hydrocortisone  25 mg Rectal BID    HYDROmorphone (DILAUDID) injection  1 mg Intravenous Q6H    PRN Meds: atropine, bisacodyl, HYDROmorphone (DILAUDID) injection, LORazepam, ondansetron (ZOFRAN) IV  Physical Exam Vitals and nursing note reviewed.  Constitutional:      General: He is not in acute distress.    Appearance: He is ill-appearing.  Cardiovascular:     Rate and Rhythm: Bradycardia present.  Pulmonary:     Effort: No accessory muscle usage or respiratory distress.  Neurological:     Mental Status: He is unresponsive.            Vital Signs: BP (!) 79/62 (BP Location: Right Arm)   Pulse (!) 56   Temp 97.7 F (36.5 C) (Axillary)   Resp (!) 9   Ht 5\' 9"  (1.753 m)   Wt 77.3 kg   SpO2 97%   BMI 25.17 kg/m  SpO2: SpO2: 97 % O2 Device: O2 Device: Room Air O2 Flow  Rate: O2 Flow Rate (L/min): 2 L/min  Intake/output summary: No intake or output data in the 24 hours ending 18-Aug-2021 1056 LBM: Last BM Date: 07/23/21 Baseline Weight: Weight: 79 kg Most recent weight: Weight: 77.3 kg       Palliative Assessment/Data: 10%      Patient Active Problem List   Diagnosis Date Noted   Protein-calorie malnutrition, severe 07/20/2021   Septic shock ([place]) 07/11/2021   Pressure injury of skin 07/07/2021   Closed right  ankle fracture, with nonunion, subsequent encounter 07/06/2021   Charcot's joint of right ankle    Personal history of diabetic foot ulcer 04/11/2020   Diabetic infection of right foot ([place]) 03/26/2020   Headache, unspecified 09/10/2019   Diarrhea, unspecified 08/16/2019   Encounter for removal of sutures 07/28/2018   Cryptogenic cirrhosis ([place]) 07/16/2018   Encounter for immunization 07/01/2018   Gastritis 01/23/2018   Acute blood loss anemia 01/23/2018   Duodenal ulcer    Upper GI bleed 01/22/2018   History of CVA (cerebrovascular accident) 01/15/2018   Iron deficiency anemia, unspecified 11/25/2017   Coagulation defect, unspecified ([place]) 11/18/2017   Unspecified protein-calorie malnutrition ([place]) 11/11/2017   Acquired absence of spleen 10/30/2017   Nicotine dependence, unspecified, uncomplicated 35/36/1443   Other disorders of phosphorus metabolism 10/30/2017   Unspecified nephritic syndrome with unspecified morphologic changes 10/30/2017   Anemia in chronic kidney disease 10/24/2017   Dependence on renal dialysis ([place]) 10/24/2017   Encounter for screening for respiratory tuberculosis 10/24/2017   Pruritus, unspecified 10/24/2017   Secondary hyperparathyroidism of renal origin ([place]) 10/24/2017   Shortness of breath 10/24/2017   Type 1 diabetes mellitus with kidney complication ([place]) - on insulin 05/01/2017   Hemorrhagic stroke [facility])- Jan 2010 05/01/2017   H/O hyperlipidemia- sev yrs ago 05/01/2017   h/o Hypertriglyceridemia-  13000's in past 05/01/2017   H/O splenectomy 05/01/2017   Status post cholecystectomy 05/01/2017   Gout- L foot- mult times 05/01/2017   Diabetic neuropathy ([place])- 6 mo or so; never started meds 05/01/2017   Vitamin D deficiency 05/01/2017   ESRD on dialysis ([place]) 02/05/2017   Anasarca- may 2018 hosp 02/05/2017   Leukocytosis- s/p plenectomy 02/05/2017   Hypertension    Diabetes mellitus without complication (HCC)    Tobacco abuse- >er than 55 pack yr hx; current smoker     Palliative Care Assessment & Plan   Patient Profile: Per intake H&P --> Brian [name] is a 60 y/o gentleman with a history of HTN, ESRD, DM, cirrhosis who presented with confusion and fatigue that has been noticeable since earlier today per his son. He did well in PT, but after was more confused. Per triage notes, he has had declining health status since his BKA 2 weeks ago. Prior to his surgery he lived at home. He has been going to his HD sessions as scheduled, most recently on 10/12.    The ICU team had comprehensive family discussions. [place] transitioned to comfort focused care.     Assessment: End of life care  Recommendations/Plan: Continue comfort care, no changes to medications required today Psychosocial and emotional support provided Appreciate chaplain support for spiritual care Ongoing PMT support and symptom management  Goals of Care and Additional Recommendations: Limitations on Scope of Treatment: Full Comfort Care  Prognosis:  Hours - Days  Discharge Planning: Anticipated Hospital Death  Care plan was discussed with Patient's sister Ulis Rias, RN  Total time: 15 minutes Greater than 50% of this time was spent in counseling and coordinating care related to the above assessment and plan.  Dorthy Cooler, PA-C Palliative Medicine Team Team phone # [phone]  Thank you for allowing the Palliative Medicine Team to assist in the care of this patient. Please utilize secure chat with  additional questions, if there is no response within 30 minutes please call the above phone number.  Palliative Medicine Team providers are available by phone from 7am to 7pm daily and can be reached through the team cell phone.  Should this patient require  assistance outside of these hours, please call the patient's attending physician.

## 2021-08-07 NOTE — Progress Notes (Signed)
Nutrition Brief Note  Chart reviewed. Pt now transitioning to comfort care. NPO. Cortrak removed No further nutrition interventions planned at this time.  Please re-consult as needed.   Kerman Passey MS, RDN, LDN, CNSC Registered Dietitian III Clinical Nutrition RD Pager and On-Call Pager Number Located in [place]

## 2021-08-07 DEATH — deceased

## 2021-08-09 ENCOUNTER — Ambulatory Visit: Payer: Medicare Other | Admitting: Gastroenterology

## 2022-12-05 NOTE — Telephone Encounter (Signed)
error
